# Patient Record
Sex: Female | Born: 1955 | Race: Black or African American | Hispanic: No | Marital: Married | Smoking: Former smoker
Health system: Southern US, Community
[De-identification: ages and names within clinical notes are randomized; demographics above are authoritative.]

## PROBLEM LIST (undated history)

## (undated) DIAGNOSIS — M4300 Spondylolysis, site unspecified: Secondary | ICD-10-CM

## (undated) DIAGNOSIS — D573 Sickle-cell trait: Secondary | ICD-10-CM

## (undated) DIAGNOSIS — R0602 Shortness of breath: Secondary | ICD-10-CM

## (undated) DIAGNOSIS — M549 Dorsalgia, unspecified: Secondary | ICD-10-CM

## (undated) DIAGNOSIS — M2550 Pain in unspecified joint: Secondary | ICD-10-CM

## (undated) DIAGNOSIS — E785 Hyperlipidemia, unspecified: Secondary | ICD-10-CM

## (undated) DIAGNOSIS — K219 Gastro-esophageal reflux disease without esophagitis: Secondary | ICD-10-CM

## (undated) DIAGNOSIS — Z720 Tobacco use: Secondary | ICD-10-CM

## (undated) DIAGNOSIS — G8929 Other chronic pain: Secondary | ICD-10-CM

## (undated) DIAGNOSIS — J449 Chronic obstructive pulmonary disease, unspecified: Secondary | ICD-10-CM

## (undated) DIAGNOSIS — I10 Essential (primary) hypertension: Secondary | ICD-10-CM

## (undated) HISTORY — PX: DILATION AND CURETTAGE OF UTERUS: SHX78

## (undated) HISTORY — PX: BREAST SURGERY: SHX581

## (undated) HISTORY — PX: BACK SURGERY: SHX140

## (undated) HISTORY — DX: Gastro-esophageal reflux disease without esophagitis: K21.9

## (undated) HISTORY — PX: COLONOSCOPY: SHX174

---

## 1999-05-29 ENCOUNTER — Other Ambulatory Visit: Admission: RE | Admit: 1999-05-29 | Discharge: 1999-05-29 | Payer: Self-pay | Admitting: Obstetrics

## 2000-05-11 ENCOUNTER — Encounter: Payer: Self-pay | Admitting: Obstetrics

## 2000-05-11 ENCOUNTER — Encounter: Admission: RE | Admit: 2000-05-11 | Discharge: 2000-05-11 | Payer: Self-pay | Admitting: Obstetrics

## 2001-05-12 ENCOUNTER — Encounter: Payer: Self-pay | Admitting: Obstetrics

## 2001-05-12 ENCOUNTER — Encounter: Admission: RE | Admit: 2001-05-12 | Discharge: 2001-05-12 | Payer: Self-pay | Admitting: Obstetrics

## 2002-05-16 ENCOUNTER — Encounter: Admission: RE | Admit: 2002-05-16 | Discharge: 2002-05-16 | Payer: Self-pay | Admitting: Obstetrics

## 2002-05-16 ENCOUNTER — Encounter: Payer: Self-pay | Admitting: Obstetrics

## 2003-03-20 ENCOUNTER — Encounter: Payer: Self-pay | Admitting: Family Medicine

## 2003-03-20 ENCOUNTER — Encounter: Admission: RE | Admit: 2003-03-20 | Discharge: 2003-03-20 | Payer: Self-pay | Admitting: Family Medicine

## 2003-05-18 ENCOUNTER — Encounter: Payer: Self-pay | Admitting: Obstetrics

## 2003-05-18 ENCOUNTER — Encounter: Admission: RE | Admit: 2003-05-18 | Discharge: 2003-05-18 | Payer: Self-pay | Admitting: Obstetrics

## 2004-03-07 ENCOUNTER — Ambulatory Visit (HOSPITAL_COMMUNITY): Admission: RE | Admit: 2004-03-07 | Discharge: 2004-03-07 | Payer: Self-pay | Admitting: Obstetrics

## 2004-03-07 ENCOUNTER — Encounter (INDEPENDENT_AMBULATORY_CARE_PROVIDER_SITE_OTHER): Payer: Self-pay | Admitting: Specialist

## 2004-05-08 ENCOUNTER — Encounter: Admission: RE | Admit: 2004-05-08 | Discharge: 2004-05-08 | Payer: Self-pay | Admitting: Family Medicine

## 2004-06-04 ENCOUNTER — Encounter: Admission: RE | Admit: 2004-06-04 | Discharge: 2004-06-04 | Payer: Self-pay | Admitting: Obstetrics

## 2005-06-05 ENCOUNTER — Encounter: Admission: RE | Admit: 2005-06-05 | Discharge: 2005-06-05 | Payer: Self-pay | Admitting: Obstetrics

## 2005-10-13 HISTORY — PX: BREAST EXCISIONAL BIOPSY: SUR124

## 2006-06-09 ENCOUNTER — Encounter: Admission: RE | Admit: 2006-06-09 | Discharge: 2006-06-09 | Payer: Self-pay | Admitting: Obstetrics

## 2007-06-15 ENCOUNTER — Encounter: Admission: RE | Admit: 2007-06-15 | Discharge: 2007-06-15 | Payer: Self-pay | Admitting: Obstetrics

## 2007-06-18 ENCOUNTER — Encounter: Admission: RE | Admit: 2007-06-18 | Discharge: 2007-06-18 | Payer: Self-pay | Admitting: Obstetrics

## 2007-07-02 ENCOUNTER — Ambulatory Visit (HOSPITAL_BASED_OUTPATIENT_CLINIC_OR_DEPARTMENT_OTHER): Admission: RE | Admit: 2007-07-02 | Discharge: 2007-07-02 | Payer: Self-pay | Admitting: General Surgery

## 2007-07-02 ENCOUNTER — Encounter: Admission: RE | Admit: 2007-07-02 | Discharge: 2007-07-02 | Payer: Self-pay | Admitting: General Surgery

## 2007-07-02 ENCOUNTER — Encounter (HOSPITAL_BASED_OUTPATIENT_CLINIC_OR_DEPARTMENT_OTHER): Payer: Self-pay | Admitting: General Surgery

## 2007-07-05 HISTORY — PX: BREAST EXCISIONAL BIOPSY: SUR124

## 2008-03-09 ENCOUNTER — Encounter: Admission: RE | Admit: 2008-03-09 | Discharge: 2008-03-09 | Payer: Self-pay | Admitting: Family Medicine

## 2008-06-15 ENCOUNTER — Encounter: Admission: RE | Admit: 2008-06-15 | Discharge: 2008-06-15 | Payer: Self-pay | Admitting: Obstetrics

## 2009-06-19 ENCOUNTER — Encounter: Admission: RE | Admit: 2009-06-19 | Discharge: 2009-06-19 | Payer: Self-pay | Admitting: Obstetrics

## 2010-06-20 ENCOUNTER — Encounter: Admission: RE | Admit: 2010-06-20 | Discharge: 2010-06-20 | Payer: Self-pay | Admitting: Obstetrics

## 2010-11-04 ENCOUNTER — Encounter: Payer: Self-pay | Admitting: Obstetrics

## 2011-01-30 ENCOUNTER — Other Ambulatory Visit (HOSPITAL_COMMUNITY)
Admission: RE | Admit: 2011-01-30 | Discharge: 2011-01-30 | Disposition: A | Discharge status: Home / Self Care | Payer: BC Managed Care – PPO | Source: Ambulatory Visit | Attending: Family Medicine | Admitting: Family Medicine

## 2011-01-30 ENCOUNTER — Other Ambulatory Visit: Payer: Self-pay | Admitting: Family Medicine

## 2011-01-30 DIAGNOSIS — Z124 Encounter for screening for malignant neoplasm of cervix: Secondary | ICD-10-CM | POA: Insufficient documentation

## 2011-01-30 DIAGNOSIS — R87810 Cervical high risk human papillomavirus (HPV) DNA test positive: Secondary | ICD-10-CM | POA: Insufficient documentation

## 2011-02-25 NOTE — Op Note (Signed)
NAMEDENISS, WORMLEY NO.:  [id number]   MEDICAL RECORD NO.:  [id number]          PATIENT TYPE:  AMB   LOCATION:  DSC                          FACILITY:  [facility]   PHYSICIAN:  Leonie Man, M.D.   DATE OF BIRTH:  1956/07/13   DATE OF PROCEDURE:  07/02/2007  DATE OF DISCHARGE:                               OPERATIVE REPORT   PREOPERATIVE DIAGNOSIS:  Abnormal left breast calcifications, rule out  carcinoma.   POSTOPERATIVE DIAGNOSIS:  Abnormal left breast calcifications, rule out  carcinoma.   PROCEDURE:  Needle localized excisional biopsy of left breast lesion.   SURGEON:  Leonie Man, M.D.   ASSISTANT:  OR nurse.   ANESTHESIA:  General.   NOTE:  Tammy Schwartz is a 55 year old woman presenting after mammogram  showing pleomorphic calcifications of the upper outer quadrant of her  left breast.  This was not able to be stereotactically biopsied due to  the fact that she had very small breasts and the lesion was so close to  the chest wall.  The patient comes to the operating room now after  needle localization for excisional biopsy of this lesion.  She  understands the risks and potential benefits of surgery and gives her  consent to same.   DESCRIPTION OF PROCEDURE:  The patient was positioned supinely.  Following the induction of satisfactory general endotracheal anesthesia,  the left breast was prepped and draped to be included in a sterile  operative field. Prior to beginning, the patient was identified as Tammy Schwartz and the site to be operated on as the left breast. A  localizing needle was already in place.  An elliptical incision was  carried down around the localizing needle deepening this through skin  down to the subcutaneous tissues. Flaps were raised superiorly and  inferiorly, laterally and medially, and a wedge of breast tissues taken  out carried all the way down to the chest wall following the localizing  needle.  The lesion is  removed and forwarded for specimen mammography.  Specimen mammography confirms that the calcifications are within the  breast.  Hemostasis was obtained with electrocautery.  Sponge and  instrument counts were verified.  The breast tissues were reapproximated  with 3-0 Vicryl sutures.  The skin was closed with a running 5-0  Monocryl suture and then reinforced with Steri-Strips.  Sterile  dressings were applied.  The anesthetic was reversed.  The patient was  removed from the operating room to the recovery room in stable  condition.  She tolerated the procedure well.     Leonie Man, M.D.  Electronically Signed    PB/MEDQ  D:  07/02/2007  T:  07/03/2007  Job:  [id number]   cc:   Kathreen Cosier, M.D.

## 2011-02-28 NOTE — Op Note (Signed)
NAME:  Tammy Schwartz, Tammy Schwartz                      ACCOUNT NO.:  [id number]   MEDICAL RECORD NO.:  [id number]                   PATIENT TYPE:  AMB   LOCATION:  SDC                                  FACILITY:  [facility]   PHYSICIAN:  Kathreen Cosier, M.D.           DATE OF BIRTH:  1956-07-07   DATE OF PROCEDURE:  03/07/2004  DATE OF DISCHARGE:                                 OPERATIVE REPORT   PREOPERATIVE DIAGNOSIS:  Dysfunctional uterine bleeding, dysmenorrhea, myoma  uteri.   POSTOPERATIVE DIAGNOSIS:  Dysfunctional uterine bleeding, dysmenorrhea,  myoma uteri.   PROCEDURE:  Hysteroscopy, dilation and curettage, Novasure endometrial  ablation.   Using general anesthesia with the patient in the lithotomy position, the  perineum and vagina were prepped and draped.  The bladder was emptied with  straight catheter.  Bimanual exam revealed the uterus to be enlarged with  small myomas.  The weighted speculum was placed in the vagina.  The anterior  lip of the cervix was grasped with the tenaculum.  The endocervix was  curetted and a small amount of tissue obtained.  The endometrial cavity was  sounded to 10 cm.  Using the Hegar dilator, cervical depth was measured to  about 5 cm making the cavity length 12 cm.  The cervix was dilated to a #27  Shawnie Pons.  The 5 mm hysteroscope was inserted.  The cavity appeared a bit  thickened, otherwise, normal.  The hysteroscopy pump was filled at 8 mmHg  ringers lactate  A sharp curettage was performed.  The Novasure was  inserted.  The cavity integrity was tested and noted to be intact.  Then,  the ablation power was at 60 watts for 90 seconds.  Hysteroscopy was  performed and the total cavity was noted to be ablated.  The patient  tolerated the procedure well and was taken to the recovery room in good  condition.                                               Kathreen Cosier, M.D.    BAM/MEDQ  D:  03/07/2004  T:  03/07/2004  Job:  [id number]

## 2011-05-23 ENCOUNTER — Other Ambulatory Visit: Payer: Self-pay | Admitting: Family Medicine

## 2011-05-23 DIAGNOSIS — Z1231 Encounter for screening mammogram for malignant neoplasm of breast: Secondary | ICD-10-CM

## 2011-07-01 ENCOUNTER — Ambulatory Visit: Payer: BC Managed Care – PPO

## 2011-07-14 ENCOUNTER — Ambulatory Visit
Admission: RE | Admit: 2011-07-14 | Discharge: 2011-07-14 | Disposition: A | Discharge status: Home / Self Care | Payer: BC Managed Care – PPO | Source: Ambulatory Visit | Attending: Family Medicine | Admitting: Family Medicine

## 2011-07-14 DIAGNOSIS — Z1231 Encounter for screening mammogram for malignant neoplasm of breast: Secondary | ICD-10-CM

## 2011-07-24 LAB — POCT HEMOGLOBIN-HEMACUE
Hemoglobin: 14.9
Operator id: 112821

## 2011-07-31 ENCOUNTER — Inpatient Hospital Stay (INDEPENDENT_AMBULATORY_CARE_PROVIDER_SITE_OTHER)
Admission: RE | Admit: 2011-07-31 | Discharge: 2011-07-31 | Disposition: A | Discharge status: Home / Self Care | Payer: BC Managed Care – PPO | Source: Ambulatory Visit | Attending: Family Medicine | Admitting: Family Medicine

## 2011-07-31 ENCOUNTER — Ambulatory Visit (INDEPENDENT_AMBULATORY_CARE_PROVIDER_SITE_OTHER): Payer: BC Managed Care – PPO

## 2011-07-31 DIAGNOSIS — S63509A Unspecified sprain of unspecified wrist, initial encounter: Secondary | ICD-10-CM

## 2011-07-31 DIAGNOSIS — M545 Low back pain, unspecified: Secondary | ICD-10-CM

## 2012-03-17 ENCOUNTER — Encounter (HOSPITAL_COMMUNITY): Payer: Self-pay | Admitting: *Deleted

## 2012-03-17 ENCOUNTER — Emergency Department (INDEPENDENT_AMBULATORY_CARE_PROVIDER_SITE_OTHER)
Admission: EM | Admit: 2012-03-17 | Discharge: 2012-03-17 | Disposition: A | Discharge status: Home / Self Care | Payer: BC Managed Care – PPO | Source: Home / Self Care | Attending: Family Medicine | Admitting: Family Medicine

## 2012-03-17 ENCOUNTER — Ambulatory Visit: Payer: BC Managed Care – PPO

## 2012-03-17 DIAGNOSIS — R52 Pain, unspecified: Secondary | ICD-10-CM

## 2012-03-17 DIAGNOSIS — J40 Bronchitis, not specified as acute or chronic: Secondary | ICD-10-CM

## 2012-03-17 DIAGNOSIS — M549 Dorsalgia, unspecified: Secondary | ICD-10-CM

## 2012-03-17 DIAGNOSIS — G8929 Other chronic pain: Secondary | ICD-10-CM

## 2012-03-17 MED ORDER — ALBUTEROL SULFATE HFA 108 (90 BASE) MCG/ACT IN AERS: 1.0000 | INHALATION_SPRAY | Freq: Four times a day (QID) | RESPIRATORY_TRACT | Status: DC | PRN

## 2012-03-17 MED ORDER — AZITHROMYCIN 250 MG PO TABS: 250.0000 mg | ORAL_TABLET | Freq: Every day | ORAL | Status: AC

## 2012-03-17 MED ORDER — PREDNISONE 20 MG PO TABS: ORAL_TABLET | ORAL | Status: AC

## 2012-03-17 MED ORDER — CELECOXIB 100 MG PO CAPS: 100.0000 mg | ORAL_CAPSULE | Freq: Two times a day (BID) | ORAL | Status: AC

## 2012-03-17 MED ORDER — CYCLOBENZAPRINE HCL 10 MG PO TABS: 10.0000 mg | ORAL_TABLET | Freq: Two times a day (BID) | ORAL | Status: AC | PRN

## 2012-03-17 MED ORDER — TRAMADOL HCL 50 MG PO TABS: 50.0000 mg | ORAL_TABLET | Freq: Three times a day (TID) | ORAL | Status: AC | PRN

## 2012-03-17 MED ORDER — BENZONATATE 100 MG PO CAPS: 100.0000 mg | ORAL_CAPSULE | Freq: Three times a day (TID) | ORAL | Status: AC

## 2012-03-17 NOTE — ED Notes (Signed)
Pt Reports

## 2012-03-17 NOTE — ED Notes (Signed)
Pt  Reports     Low  Back  Pain  With   Radiating  Down   Leg      X  sev  Weeks        She  denys  Any  Injury   She      Reports  Symptoms  Of  Cough as  Well       She  Is  Awake  As  Well as  Alert and  Oriented  She  Ambulates  To  Room  With a  Slow  Steady  Gait  She  Is  Sitting  Upright on  Exam table

## 2012-03-17 NOTE — ED Provider Notes (Signed)
History     CSN: [id number]  Arrival date & time 03/17/12  1112   First MD Initiated Contact with Patient 03/17/12 1115      Chief Complaint  Patient presents with  . Back Pain    (Consider location/radiation/quality/duration/timing/severity/associated sxs/prior treatment) HPI Comments: 56 year old smoker female with history of high blood pressure and chronic back pain. Here complaining of low back pain exacerbation for more than 2 weeks. Reports radiation to pain down to left eye and left knee. Taking ibuprofen inconsistently been using heat pad without improvement. Denies leg weakness or incontinence. No numbness or paresthesias in the lower extremities. Pain worse with movement. Also pain worse with cough the patient has been coughing intermittently for about one week reports cough is worst at nighttime and associated with wheezing also reports green sputum in the last 3 days. Denies fever. Denies chest pain or shortness of breath. No fever, dizziness or malaise. States she has had physical therapy for her back in the past.    History reviewed. No pertinent past medical history.  History reviewed. No pertinent past surgical history.  No family history on file.  History  Substance Use Topics  . Smoking status: Not on file  . Smokeless tobacco: Not on file  . Alcohol Use: Not on file    OB History    Grav Para Term Preterm Abortions TAB SAB Ect Mult Living                  Review of Systems  Constitutional: Negative for fever, chills and appetite change.  HENT: Positive for congestion. Negative for ear pain, sore throat, trouble swallowing, neck pain and sinus pressure.   Eyes: Negative for discharge.  Respiratory: Positive for cough and wheezing. Negative for chest tightness and shortness of breath.   Cardiovascular: Negative for chest pain, palpitations and leg swelling.  Gastrointestinal: Negative for nausea, vomiting, abdominal pain and constipation.  Genitourinary:  Negative for dysuria, frequency, hematuria and flank pain.  Musculoskeletal: Positive for back pain. Negative for joint swelling.  Skin: Negative for rash.  Neurological: Negative for dizziness, weakness, numbness and headaches.  All other systems reviewed and are negative.    Allergies  Review of patient's allergies indicates no known allergies.  Home Medications   Current Outpatient Rx  Name Route Sig Dispense Refill  . ALBUTEROL SULFATE HFA 108 (90 BASE) MCG/ACT IN AERS Inhalation Inhale 1-2 puffs into the lungs every 6 (six) hours as needed for wheezing or shortness of breath (or cough spells). 1 Inhaler 0  . BENZONATATE 100 MG PO CAPS Oral Take 1 capsule (100 mg total) by mouth every 8 (eight) hours. for cough 21 capsule 0  . CELECOXIB 100 MG PO CAPS Oral Take 1 capsule (100 mg total) by mouth 2 (two) times daily. 20 capsule 0  . CYCLOBENZAPRINE HCL 10 MG PO TABS Oral Take 1 tablet (10 mg total) by mouth 2 (two) times daily as needed for muscle spasms. 20 tablet 0  . PREDNISONE 20 MG PO TABS  2 tabs po daily for 5 days 10 tablet 0  . TRAMADOL HCL 50 MG PO TABS Oral Take 1 tablet (50 mg total) by mouth every 8 (eight) hours as needed for pain. 15 tablet 0    BP 137/86  Pulse 76  Temp(Src) 98.7 F (37.1 C) (Oral)  Resp 24  SpO2 100%  LMP 03/17/2012  Physical Exam  Nursing note and vitals reviewed. Constitutional: She is oriented to person, place, and time.  She appears well-developed and well-nourished. No distress.  HENT:  Head: Normocephalic and atraumatic.  Mouth/Throat: No oropharyngeal exudate.  Eyes: Conjunctivae and EOM are normal. Pupils are equal, round, and reactive to light.  Neck: Neck supple. No JVD present.  Pulmonary/Chest: No respiratory distress. She has no wheezes. She has no rales.       Bronchitic cough with bilateral sporadic expiratory rhonchi. No tachypnea or orthopnea. No active wheezing.  Abdominal: Soft. She exhibits no mass. There is no  tenderness.  Musculoskeletal:       Spine central. Limited flexion due to pain. Fair extension. Tenderness over lumbar paravertebral muscles bilaterally. Left straight leg test with reported pain in back and radiation to left thigh. Lower extremities with normal strength, as well as superficial 2 point discrimination and deep sensation. Symmetric Achilles and patellar DTRs.  Can walk on heels and tip toes.    Lymphadenopathy:    She has no cervical adenopathy.  Neurological: She is alert and oriented to person, place, and time.  Skin: No rash noted.    ED Course  Procedures (including critical care time)  Labs Reviewed - No data to display No results found.   1. Bronchitis   2. Exacerbation of chronic back pain       MDM  Smoker coughing with green sputum; symptoms for about one week associated with wheezing. Normal lung examination today but symptoms consistent with bronchitis. Prescribed prednisone, azithromycin, Tessalon Perles and albuterol. Chronic low back pain exacerbation with left sciatica. Prescribed Celebrex, Flexeril and tramadol asked to start stretching exercises as soon as pain improves and followup with primary care provider to monitor symptoms.        Sharin Grave, MD 03/20/12 [phone]

## 2012-03-17 NOTE — Discharge Instructions (Signed)
My impression is that you have bronchitis and frequent cough is triggering your chronic back pain. You need to quit smoking! Take the prescribed medications as instructed. Be aware that Flexeril and tramadol can make you drowsy and he should not drive after taking this medications. Start doing back stretching exercises as soon as pain improves follow provided handout. Return if worsening symptoms like chest pain, fever or difficulty breathing or followup with your primary care provider to monitor your symptoms if persistent despite following treatment.

## 2012-03-26 ENCOUNTER — Other Ambulatory Visit: Payer: Self-pay | Admitting: Family Medicine

## 2012-03-26 ENCOUNTER — Ambulatory Visit
Admission: RE | Admit: 2012-03-26 | Discharge: 2012-03-26 | Disposition: A | Discharge status: Home / Self Care | Payer: BC Managed Care – PPO | Source: Ambulatory Visit | Attending: Family Medicine | Admitting: Family Medicine

## 2012-03-26 DIAGNOSIS — M1250 Traumatic arthropathy, unspecified site: Secondary | ICD-10-CM

## 2012-04-12 ENCOUNTER — Other Ambulatory Visit: Payer: Self-pay | Admitting: Orthopedic Surgery

## 2012-04-12 DIAGNOSIS — M549 Dorsalgia, unspecified: Secondary | ICD-10-CM

## 2012-04-12 DIAGNOSIS — M5126 Other intervertebral disc displacement, lumbar region: Secondary | ICD-10-CM

## 2012-04-23 ENCOUNTER — Ambulatory Visit
Admission: RE | Admit: 2012-04-23 | Discharge: 2012-04-23 | Disposition: A | Discharge status: Home / Self Care | Payer: BC Managed Care – PPO | Source: Ambulatory Visit | Attending: Orthopedic Surgery | Admitting: Orthopedic Surgery

## 2012-04-23 DIAGNOSIS — M5126 Other intervertebral disc displacement, lumbar region: Secondary | ICD-10-CM

## 2012-04-23 DIAGNOSIS — M549 Dorsalgia, unspecified: Secondary | ICD-10-CM

## 2012-06-02 ENCOUNTER — Other Ambulatory Visit: Payer: Self-pay | Admitting: Neurosurgery

## 2012-06-03 ENCOUNTER — Encounter (HOSPITAL_COMMUNITY): Payer: Self-pay

## 2012-06-03 ENCOUNTER — Encounter (HOSPITAL_COMMUNITY): Payer: Self-pay | Admitting: Pharmacy Technician

## 2012-06-03 MED ORDER — CEFAZOLIN SODIUM-DEXTROSE 2-3 GM-% IV SOLR
2.0000 g | INTRAVENOUS | Status: AC
  Administered 2012-06-04: 2 g via INTRAVENOUS
  Filled 2012-06-03: qty 50

## 2012-06-04 ENCOUNTER — Encounter (HOSPITAL_COMMUNITY): Payer: Self-pay

## 2012-06-04 ENCOUNTER — Encounter (HOSPITAL_COMMUNITY)
Admission: RE | Disposition: A | Discharge status: Home / Self Care | Payer: Self-pay | Source: Ambulatory Visit | Attending: Neurosurgery

## 2012-06-04 ENCOUNTER — Ambulatory Visit (HOSPITAL_COMMUNITY): Payer: BC Managed Care – PPO

## 2012-06-04 ENCOUNTER — Ambulatory Visit (HOSPITAL_COMMUNITY)
Admission: RE | Admit: 2012-06-04 | Discharge: 2012-06-05 | Disposition: A | Discharge status: Home / Self Care | Payer: BC Managed Care – PPO | Source: Ambulatory Visit | Attending: Neurosurgery | Admitting: Neurosurgery

## 2012-06-04 DIAGNOSIS — I10 Essential (primary) hypertension: Secondary | ICD-10-CM | POA: Insufficient documentation

## 2012-06-04 DIAGNOSIS — M5126 Other intervertebral disc displacement, lumbar region: Secondary | ICD-10-CM | POA: Diagnosis present

## 2012-06-04 DIAGNOSIS — R0602 Shortness of breath: Secondary | ICD-10-CM | POA: Insufficient documentation

## 2012-06-04 HISTORY — PX: LUMBAR LAMINECTOMY/DECOMPRESSION MICRODISCECTOMY: SHX5026

## 2012-06-04 HISTORY — DX: Essential (primary) hypertension: I10

## 2012-06-04 HISTORY — DX: Shortness of breath: R06.02

## 2012-06-04 HISTORY — DX: Sickle-cell trait: D57.3

## 2012-06-04 LAB — CBC
HCT: 36.3 % (ref 36.0–46.0)
Hemoglobin: 13.2 g/dL (ref 12.0–15.0)
MCH: 33.1 pg (ref 26.0–34.0)
MCHC: 36.4 g/dL — ABNORMAL HIGH (ref 30.0–36.0)
MCV: 91 fL (ref 78.0–100.0)
Platelets: 278 K/uL (ref 150–400)
RBC: 3.99 MIL/uL (ref 3.87–5.11)
RDW: 13.6 % (ref 11.5–15.5)
WBC: 7.7 K/uL (ref 4.0–10.5)

## 2012-06-04 LAB — BASIC METABOLIC PANEL
BUN: 9 mg/dL (ref 6–23)
Calcium: 9.3 mg/dL (ref 8.4–10.5)
GFR calc Af Amer: >90 mL/min (ref >90)
GFR calc non Af Amer: >90 mL/min (ref >90)
Potassium: 3 mEq/L — ABNORMAL LOW (ref 3.5–5.1)
Sodium: 139 mEq/L (ref 135–145)

## 2012-06-04 LAB — SURGICAL PCR SCREEN
MRSA, PCR: NEGATIVE
Staphylococcus aureus: NEGATIVE

## 2012-06-04 SURGERY — LUMBAR LAMINECTOMY/DECOMPRESSION MICRODISCECTOMY 1 LEVEL
Anesthesia: General | Laterality: Left | Wound class: Clean

## 2012-06-04 MED ORDER — OXYCODONE HCL 5 MG/5ML PO SOLN: 5.0000 mg | Freq: Once | ORAL | Status: DC | PRN

## 2012-06-04 MED ORDER — MENTHOL 3 MG MT LOZG: 1.0000 | LOZENGE | OROMUCOSAL | Status: DC | PRN

## 2012-06-04 MED ORDER — ACETAMINOPHEN 10 MG/ML IV SOLN
INTRAVENOUS | Status: AC
  Administered 2012-06-04: 1000 mg
  Filled 2012-06-04: qty 100

## 2012-06-04 MED ORDER — DROPERIDOL 2.5 MG/ML IJ SOLN: 0.6250 mg | INTRAMUSCULAR | Status: DC | PRN

## 2012-06-04 MED ORDER — ADULT MULTIVITAMIN W/MINERALS CH
1.0000 | ORAL_TABLET | Freq: Every day | ORAL | Status: DC
  Administered 2012-06-04: 1 via ORAL
  Filled 2012-06-04 (×2): qty 1

## 2012-06-04 MED ORDER — MORPHINE SULFATE 2 MG/ML IJ SOLN: 1.0000 mg | INTRAMUSCULAR | Status: DC | PRN

## 2012-06-04 MED ORDER — ACETAMINOPHEN 10 MG/ML IV SOLN
1000.0000 mg | Freq: Four times a day (QID) | INTRAVENOUS | Status: DC
  Administered 2012-06-04 – 2012-06-05 (×2): 1000 mg via INTRAVENOUS
  Filled 2012-06-04 (×4): qty 100

## 2012-06-04 MED ORDER — HYDROCODONE-ACETAMINOPHEN 5-325 MG PO TABS: 1.0000 | ORAL_TABLET | ORAL | Status: DC | PRN

## 2012-06-04 MED ORDER — FENTANYL CITRATE 0.05 MG/ML IJ SOLN
INTRAMUSCULAR | Status: AC
  Filled 2012-06-04: qty 2

## 2012-06-04 MED ORDER — LIDOCAINE-EPINEPHRINE 0.5 %-1:200000 IJ SOLN
INTRAMUSCULAR | Status: DC | PRN
  Administered 2012-06-04: 50 mL

## 2012-06-04 MED ORDER — HYDROCHLOROTHIAZIDE 25 MG PO TABS
12.5000 mg | ORAL_TABLET | Freq: Every day | ORAL | Status: DC
  Administered 2012-06-04: 12.5 mg via ORAL
  Filled 2012-06-04 (×2): qty 0.5

## 2012-06-04 MED ORDER — ACETAMINOPHEN 650 MG RE SUPP: 650.0000 mg | RECTAL | Status: DC | PRN

## 2012-06-04 MED ORDER — SODIUM CHLORIDE 0.9 % IJ SOLN: 3.0000 mL | Freq: Two times a day (BID) | INTRAMUSCULAR | Status: DC

## 2012-06-04 MED ORDER — OXYCODONE HCL 5 MG PO TABS: 5.0000 mg | ORAL_TABLET | Freq: Once | ORAL | Status: DC | PRN

## 2012-06-04 MED ORDER — LACTATED RINGERS IV SOLN
INTRAVENOUS | Status: DC | PRN
  Administered 2012-06-04 (×2): via INTRAVENOUS

## 2012-06-04 MED ORDER — ACETAMINOPHEN 325 MG PO TABS: 650.0000 mg | ORAL_TABLET | ORAL | Status: DC | PRN

## 2012-06-04 MED ORDER — ALBUTEROL SULFATE HFA 108 (90 BASE) MCG/ACT IN AERS
1.0000 | INHALATION_SPRAY | Freq: Four times a day (QID) | RESPIRATORY_TRACT | Status: DC | PRN
  Filled 2012-06-04: qty 6.7

## 2012-06-04 MED ORDER — POTASSIUM CHLORIDE IN NACL 20-0.9 MEQ/L-% IV SOLN
INTRAVENOUS | Status: DC
  Administered 2012-06-05: 03:00:00 via INTRAVENOUS
  Filled 2012-06-04 (×3): qty 1000

## 2012-06-04 MED ORDER — ALUM & MAG HYDROXIDE-SIMETH 200-200-20 MG/5ML PO SUSP: 30.0000 mL | Freq: Four times a day (QID) | ORAL | Status: DC | PRN

## 2012-06-04 MED ORDER — MUPIROCIN 2 % EX OINT
TOPICAL_OINTMENT | CUTANEOUS | Status: AC
  Administered 2012-06-04: 1 via NASAL
  Filled 2012-06-04: qty 22

## 2012-06-04 MED ORDER — ONDANSETRON HCL 4 MG/2ML IJ SOLN
INTRAMUSCULAR | Status: DC | PRN
  Administered 2012-06-04: 4 mg via INTRAVENOUS

## 2012-06-04 MED ORDER — FENTANYL CITRATE 0.05 MG/ML IJ SOLN
INTRAMUSCULAR | Status: DC | PRN
  Administered 2012-06-04: 100 ug via INTRAVENOUS

## 2012-06-04 MED ORDER — HYDROMORPHONE HCL PF 1 MG/ML IJ SOLN
INTRAMUSCULAR | Status: AC
  Administered 2012-06-04: 0.5 mg via INTRAVENOUS
  Filled 2012-06-04: qty 1

## 2012-06-04 MED ORDER — CYCLOBENZAPRINE HCL 10 MG PO TABS
10.0000 mg | ORAL_TABLET | Freq: Three times a day (TID) | ORAL | Status: DC | PRN
Start: 2012-06-04 — End: 2012-06-05

## 2012-06-04 MED ORDER — MIDAZOLAM HCL 5 MG/5ML IJ SOLN
INTRAMUSCULAR | Status: DC | PRN
  Administered 2012-06-04: 1 mg via INTRAVENOUS

## 2012-06-04 MED ORDER — OXYCODONE-ACETAMINOPHEN 5-325 MG PO TABS
1.0000 | ORAL_TABLET | ORAL | Status: DC | PRN
  Administered 2012-06-04: 2 via ORAL
  Filled 2012-06-04: qty 2

## 2012-06-04 MED ORDER — GLYCOPYRROLATE 0.2 MG/ML IJ SOLN
INTRAMUSCULAR | Status: DC | PRN
  Administered 2012-06-04: .6 mg via INTRAVENOUS

## 2012-06-04 MED ORDER — METHYLPREDNISOLONE ACETATE 80 MG/ML IJ SUSP
INTRAMUSCULAR | Status: DC | PRN
  Administered 2012-06-04: 80 mg

## 2012-06-04 MED ORDER — ONDANSETRON HCL 4 MG/2ML IJ SOLN: 4.0000 mg | INTRAMUSCULAR | Status: DC | PRN

## 2012-06-04 MED ORDER — PHENYLEPHRINE HCL 10 MG/ML IJ SOLN
10.0000 mg | INTRAVENOUS | Status: DC | PRN
  Administered 2012-06-04: 20 ug/min via INTRAVENOUS

## 2012-06-04 MED ORDER — THROMBIN 5000 UNITS EX KIT
PACK | CUTANEOUS | Status: DC | PRN
  Administered 2012-06-04 (×2): 5000 [IU] via TOPICAL

## 2012-06-04 MED ORDER — 0.9 % SODIUM CHLORIDE (POUR BTL) OPTIME
TOPICAL | Status: DC | PRN
  Administered 2012-06-04: 1000 mL

## 2012-06-04 MED ORDER — AMLODIPINE-VALSARTAN-HCTZ 5-160-12.5 MG PO TABS: 1.0000 | ORAL_TABLET | Freq: Every day | ORAL | Status: DC

## 2012-06-04 MED ORDER — PROPOFOL 10 MG/ML IV EMUL
INTRAVENOUS | Status: DC | PRN
  Administered 2012-06-04: 160 mg via INTRAVENOUS
  Administered 2012-06-04: 40 mg via INTRAVENOUS

## 2012-06-04 MED ORDER — KETOROLAC TROMETHAMINE 30 MG/ML IJ SOLN
15.0000 mg | Freq: Four times a day (QID) | INTRAMUSCULAR | Status: DC
  Administered 2012-06-04 – 2012-06-05 (×2): 15 mg via INTRAVENOUS
  Filled 2012-06-04 (×6): qty 1

## 2012-06-04 MED ORDER — ROCURONIUM BROMIDE 100 MG/10ML IV SOLN
INTRAVENOUS | Status: DC | PRN
  Administered 2012-06-04: 10 mg via INTRAVENOUS
  Administered 2012-06-04: 40 mg via INTRAVENOUS

## 2012-06-04 MED ORDER — HYDROCODONE-ACETAMINOPHEN 5-325 MG PO TABS: 1.0000 | ORAL_TABLET | Freq: Four times a day (QID) | ORAL | Status: AC | PRN

## 2012-06-04 MED ORDER — CEFAZOLIN SODIUM 1-5 GM-% IV SOLN
1.0000 g | Freq: Three times a day (TID) | INTRAVENOUS | Status: AC
  Administered 2012-06-04 – 2012-06-05 (×2): 1 g via INTRAVENOUS
  Filled 2012-06-04 (×2): qty 50

## 2012-06-04 MED ORDER — CYCLOBENZAPRINE HCL 10 MG PO TABS: 10.0000 mg | ORAL_TABLET | Freq: Three times a day (TID) | ORAL | Status: AC | PRN

## 2012-06-04 MED ORDER — HYDROMORPHONE HCL PF 1 MG/ML IJ SOLN
0.2500 mg | INTRAMUSCULAR | Status: DC | PRN
  Administered 2012-06-04: 0.5 mg via INTRAVENOUS

## 2012-06-04 MED ORDER — SODIUM CHLORIDE 0.9 % IJ SOLN: 3.0000 mL | INTRAMUSCULAR | Status: DC | PRN

## 2012-06-04 MED ORDER — IRBESARTAN 150 MG PO TABS
150.0000 mg | ORAL_TABLET | Freq: Every day | ORAL | Status: DC
  Administered 2012-06-04: 150 mg via ORAL
  Filled 2012-06-04 (×2): qty 1

## 2012-06-04 MED ORDER — NEOSTIGMINE METHYLSULFATE 1 MG/ML IJ SOLN
INTRAMUSCULAR | Status: DC | PRN
  Administered 2012-06-04: 5 mg via INTRAVENOUS

## 2012-06-04 MED ORDER — HEMOSTATIC AGENTS (NO CHARGE) OPTIME
TOPICAL | Status: DC | PRN
  Administered 2012-06-04: 1 via TOPICAL

## 2012-06-04 MED ORDER — AMLODIPINE BESYLATE 5 MG PO TABS
5.0000 mg | ORAL_TABLET | Freq: Every day | ORAL | Status: DC
  Administered 2012-06-04: 5 mg via ORAL
  Filled 2012-06-04 (×2): qty 1

## 2012-06-04 MED ORDER — PHENOL 1.4 % MT LIQD: 1.0000 | OROMUCOSAL | Status: DC | PRN

## 2012-06-04 SURGICAL SUPPLY — 56 items
ADH SKN CLS APL DERMABOND .7 (GAUZE/BANDAGES/DRESSINGS) ×1
APL SKNCLS STERI-STRIP NONHPOA (GAUZE/BANDAGES/DRESSINGS)
BAG DECANTER FOR FLEXI CONT (MISCELLANEOUS) ×1 IMPLANT
BENZOIN TINCTURE PRP APPL 2/3 (GAUZE/BANDAGES/DRESSINGS) IMPLANT
BLADE SURG ROTATE 9660 (MISCELLANEOUS) IMPLANT
BUR MATCHSTICK NEURO 3.0 LAGG (BURR) ×2 IMPLANT
CANISTER SUCTION 2500CC (MISCELLANEOUS) ×2 IMPLANT
CLOTH BEACON ORANGE TIMEOUT ST (SAFETY) ×2 IMPLANT
CONT SPEC 4OZ CLIKSEAL STRL BL (MISCELLANEOUS) ×2 IMPLANT
DECANTER SPIKE VIAL GLASS SM (MISCELLANEOUS) ×1 IMPLANT
DERMABOND ADVANCED (GAUZE/BANDAGES/DRESSINGS) ×1
DERMABOND ADVANCED .7 DNX12 (GAUZE/BANDAGES/DRESSINGS) ×1 IMPLANT
DRAPE LAPAROTOMY 100X72X124 (DRAPES) ×2 IMPLANT
DRAPE MICROSCOPE LEICA (MISCELLANEOUS) ×2 IMPLANT
DRAPE POUCH INSTRU U-SHP 10X18 (DRAPES) ×2 IMPLANT
DRAPE SURG 17X23 STRL (DRAPES) ×2 IMPLANT
DURAPREP 26ML APPLICATOR (WOUND CARE) ×2 IMPLANT
ELECT REM PT RETURN 9FT ADLT (ELECTROSURGICAL) ×2
ELECTRODE REM PT RTRN 9FT ADLT (ELECTROSURGICAL) ×1 IMPLANT
GAUZE SPONGE 4X4 16PLY XRAY LF (GAUZE/BANDAGES/DRESSINGS) IMPLANT
GLOVE BIOGEL PI IND STRL 7.0 (GLOVE) IMPLANT
GLOVE BIOGEL PI INDICATOR 7.0 (GLOVE) ×1
GLOVE ECLIPSE 6.5 STRL STRAW (GLOVE) ×3 IMPLANT
GLOVE EXAM NITRILE LRG STRL (GLOVE) IMPLANT
GLOVE EXAM NITRILE MD LF STRL (GLOVE) IMPLANT
GLOVE EXAM NITRILE XL STR (GLOVE) IMPLANT
GLOVE EXAM NITRILE XS STR PU (GLOVE) IMPLANT
GLOVE SURG SS PI 6.5 STRL IVOR (GLOVE) ×1 IMPLANT
GOWN BRE IMP SLV AUR LG STRL (GOWN DISPOSABLE) ×4 IMPLANT
GOWN BRE IMP SLV AUR XL STRL (GOWN DISPOSABLE) IMPLANT
GOWN STRL REIN 2XL LVL4 (GOWN DISPOSABLE) IMPLANT
KIT BASIN OR (CUSTOM PROCEDURE TRAY) ×2 IMPLANT
KIT ROOM TURNOVER OR (KITS) ×2 IMPLANT
NDL HYPO 18GX1.5 BLUNT FILL (NEEDLE) IMPLANT
NDL HYPO 25X1 1.5 SAFETY (NEEDLE) ×1 IMPLANT
NDL SPNL 18GX3.5 QUINCKE PK (NEEDLE) IMPLANT
NEEDLE HYPO 18GX1.5 BLUNT FILL (NEEDLE) ×2 IMPLANT
NEEDLE HYPO 25X1 1.5 SAFETY (NEEDLE) ×2 IMPLANT
NEEDLE SPNL 18GX3.5 QUINCKE PK (NEEDLE) IMPLANT
NS IRRIG 1000ML POUR BTL (IV SOLUTION) ×2 IMPLANT
PACK LAMINECTOMY NEURO (CUSTOM PROCEDURE TRAY) ×2 IMPLANT
PAD ARMBOARD 7.5X6 YLW CONV (MISCELLANEOUS) ×6 IMPLANT
RUBBERBAND STERILE (MISCELLANEOUS) ×4 IMPLANT
SPONGE GAUZE 4X4 12PLY (GAUZE/BANDAGES/DRESSINGS) IMPLANT
SPONGE LAP 4X18 X RAY DECT (DISPOSABLE) IMPLANT
SPONGE SURGIFOAM ABS GEL SZ50 (HEMOSTASIS) ×2 IMPLANT
STRIP CLOSURE SKIN 1/2X4 (GAUZE/BANDAGES/DRESSINGS) IMPLANT
SUT VIC AB 0 CT1 18XCR BRD8 (SUTURE) ×1 IMPLANT
SUT VIC AB 0 CT1 8-18 (SUTURE) ×4
SUT VIC AB 2-0 CT1 18 (SUTURE) ×2 IMPLANT
SUT VIC AB 3-0 SH 8-18 (SUTURE) ×2 IMPLANT
SYR 20ML ECCENTRIC (SYRINGE) ×2 IMPLANT
SYR 5ML LL (SYRINGE) ×1 IMPLANT
TOWEL OR 17X24 6PK STRL BLUE (TOWEL DISPOSABLE) ×2 IMPLANT
TOWEL OR 17X26 10 PK STRL BLUE (TOWEL DISPOSABLE) ×2 IMPLANT
WATER STERILE IRR 1000ML POUR (IV SOLUTION) ×2 IMPLANT

## 2012-06-04 NOTE — Discharge Summary (Signed)
Discharge Summary Admitting DX: HNP L5/S1 left, Left S1 radiculopathy  Discharge DX: HNP L5/S1 left, Left S1 radiculopathy  Physician: Coletta Memos Surgery: Left L5/S1 discetomy with microdissection Complications: none Discharge Status: Alive, well, neurologically improved WUJ:[id number], Norco  Discharge Dest: Home Tammy Schwartz at discharge has a wound which is clean, dry and without signs of infection. She is walking well, voiding, and tolerating a regular diet. She has weakness in the gastrocnemius which was present on presentation. Motor exam is otherwise normal.  Admission date 06/04/2012 Discharge Date 06/05/2012

## 2012-06-04 NOTE — Anesthesia Postprocedure Evaluation (Signed)
Anesthesia Post Note  Patient: Tammy Schwartz  Procedure(s) Performed: Procedure(s) (LRB): LUMBAR LAMINECTOMY/DECOMPRESSION MICRODISCECTOMY 1 LEVEL (Left)  Anesthesia type: general  Patient location: PACU  Post pain: Pain level controlled  Post assessment: Patient's Cardiovascular Status Stable  Last Vitals:  Filed Vitals:   06/04/12 1600  BP: 102/69  Pulse: 68  Temp:   Resp: 11    Post vital signs: Reviewed and stable  Level of consciousness: sedated  Complications: No apparent anesthesia complications

## 2012-06-04 NOTE — Plan of Care (Signed)
Problem: Consults Goal: Diagnosis - Spinal Surgery Outcome: Completed/Met Date Met:  06/04/12 Microdiscectomy

## 2012-06-04 NOTE — Preoperative (Signed)
Beta Blockers   Reason not to administer Beta Blockers:Not Applicable 

## 2012-06-04 NOTE — Anesthesia Preprocedure Evaluation (Signed)
Anesthesia Evaluation  Patient identified by MRN, date of birth, ID band Patient awake    Reviewed: Allergy & Precautions, H&P , NPO status , Patient's Chart, lab work & pertinent test results  Airway Mallampati: I TM Distance: >3 FB Neck ROM: Full    Dental  (+) Dental Advisory Given, Poor Dentition, Loose and Missing   Pulmonary shortness of breath,  breath sounds clear to auscultation  Pulmonary exam normal       Cardiovascular hypertension, Pt. on medications Rhythm:Regular Rate:Normal     Neuro/Psych    GI/Hepatic negative GI ROS, Neg liver ROS,   Endo/Other  negative endocrine ROS  Renal/GU negative Renal ROS     Musculoskeletal   Abdominal   Peds  Hematology   Anesthesia Other Findings   Reproductive/Obstetrics                           Anesthesia Physical Anesthesia Plan  ASA: II  Anesthesia Plan: General   Post-op Pain Management:    Induction: Intravenous  Airway Management Planned: Oral ETT  Additional Equipment:   Intra-op Plan:   Post-operative Plan: Extubation in OR  Informed Consent: I have reviewed the patients History and Physical, chart, labs and discussed the procedure including the risks, benefits and alternatives for the proposed anesthesia with the patient or authorized representative who has indicated his/her understanding and acceptance.   Dental advisory given  Plan Discussed with: CRNA, Anesthesiologist and Surgeon  Anesthesia Plan Comments:         Anesthesia Quick Evaluation

## 2012-06-04 NOTE — H&P (Signed)
  BP 96/61  Temp 98.2 F (36.8 C) (Oral)  Resp 18  Ht 5\' 1"  (1.549 m)  Wt 53.071 kg (117 lb)  BMI 22.11 kg/m2  SpO2 98%  LMP 03/13/2006 HISTORY:     [name] presents today for evaluation of pain that she has in her back and left lower extremity.  She has had this pain for the last four months.  It has always been on the left side.  It travels into the lower extremity, into the foot and toes.  She has pain also in her back.  She has never had problems like this in the past. She is right-handed.  She has been treated by Dr. Myrtie Neither for evaluation of this pain for some time.  He obtained an MRI July 12th and it showed a fairly large disc herniation.  He felt that he had exhausted all conservative means and she agreed to a neurosurgical consultation.    PAST MEDICAL HISTORY:  Significant for hypertension.  She has no known drug allergies. Current medications are Exforge and a multivitamin one a day.    FAMILY HISTORY:    She did not provide family history.    SOCIAL HISTORY:    She does not use illicit drugs.  She does not smoke.  She does not drink alcohol.    REVIEW OF SYSTEMS:   Positive for weight loss, leg pain with walking, leg weakness, back pain, leg pain and joint pain.  She denies ears, nose, throat, mouth, respiratory, gastrointestinal, genitourinary, skin, neurological, psychiatric, endocrine, hematologic, and allergic problems.   PHYSICAL EXAMINATION:  On exam, she is alert, oriented x 4, and answering all questions appropriately.  She is in obvious distress. She has a markedly antalgic gait, limping even with the use of her cane.  She has used a cane for at least two weeks.  On motor evaluation, she has normal 5/5 strength in the upper extremities.  Normal 5/5 strength in the right lower extremity. She has fairly profound weakness in the left gastrocnemius.  She cannot toe walk on the left side. She has a dropped ankle jerk on the left. She has 2+ reflexes in the  biceps, triceps, brachioradialis, knees, and right ankle.  Intact proprioception.  The toes are downgoing to plantar stimulation.  Normal muscle tone and bulk.  She has had no bowel or bladder dysfunction. The pupils are equal, round, and reactive to light.  Full extraocular movements, full visual fields.  Symmetric facial sensation and movement.  Hearing intact to voice.  Uvula elevates in the midline.  Shoulder shrug is normal. Tongue protrudes in the midline. Lung fields are clear.  Pulses are good at the wrists bilaterally.  No cervical masses or bruits.     DIAGNOSTIC STUDIES:   MRI shows a large disc herniation on the left side at L5-S1 along with a  degenerated level.  The conus is normal.  The cauda equina is normal.  Paraspinous soft tissues are normal.  No tumors, infections, or other untoward things in the spinal canal.   SUMMARY:      We discussed at length the operation.  I am going to go ahead and take her to the operating room this coming Friday for a lumbar laminectomy and discectomy.  Risks and benefits of bleeding, infection, no relief have been explained.  She understands and is going to have this done this Friday.

## 2012-06-04 NOTE — Op Note (Addendum)
06/04/2012  3:25 PM  PATIENT:  Tammy Schwartz  56 y.o. female with a large disc herniation at L5/S1 on the left side compromising the Left S1 root. She on exam is weak in the gastrocnemius, and has pain consistent with an S1 radiculopathy. She has had no improvement with conservative treatment over the last 4 months. She has opted for surgical decompression  PRE-OPERATIVE DIAGNOSIS:  lumbar herniated disc Left L/S1  POST-OPERATIVE DIAGNOSIS:  lumbar herniated disc Left L5/S1 PROCEDURE:  Procedure(s): LUMBAR LAMINECTOMY/DECOMPRESSION MICRODISCECTOMY 1 LEVEL Left L5/S1 Microdissection  SURGEON:  Surgeon(s): Carmela Hurt, MD  ASSISTANTS:none  ANESTHESIA:   general  EBL:  Total I/O In: 1000 [I.V.:1000] Out: -   BLOOD ADMINISTERED:none  CELL SAVER GIVEN:none   COUNT:per nursing  DRAINS: none   SPECIMEN:  No Specimen  DICTATION: [name] was brought to the operating room and intubated without difficulty. She was positioned prone on the Wilson frame with all pressure points padded properly. Her lumbar region was prepped and draped in a sterile manner. I infiltrated 10cc 1/2%lidocaine with epinephrine into the midline at ~ the L5-S1 level. I opened the skin with a 10 blade and exposed the L5 and S1 lamina on the left. I confirmed my location with xray intraoperatively.I incised the ligamentum flavum between L5 and S1 to expose the epidural fat. I removed the ligament with Kerrison rongeurs and exposed the thecal sac. I with microscopic dissection retracted the thecal sac medially and the disc space. I used right angle blunt tip dissectors and removed large disc fragements caudal to the disc space. I was however not satisfied with the decompression, nor with the remaining disc impinging on the thecal sac. I therefore opened the disc space with a 15 blade and performed a discetomy. I used Epstein curettes, Kerrison punches, and pituitary rongeurs to remove disc within the disc  space. I decompressed the S1 nerve root and removed any loose disc material that I could. I inspected the nerve root, rostrally, caudally, medially, and laterally. I felt the root was well decompressed. I irrigated then infiltrated fentanyl and solumedrol over the nerve root and resection site. I closed the wound in layered fashion approximating the thoracolumbar fascia, subcutaneous, and subcuticular layers with vicryl sutures. I used dermAbond for a sterile dressing.   PLAN OF CARE: Admit for overnight observation  PATIENT DISPOSITION:  PACU - hemodynamically stable.   Delay start of Pharmacological VTE agent (>24hrs) due to surgical blood loss or risk of bleeding:  yes

## 2012-06-04 NOTE — Transfer of Care (Signed)
Immediate Anesthesia Transfer of Care Note  Patient: Tammy Schwartz  Procedure(s) Performed: Procedure(s) (LRB): LUMBAR LAMINECTOMY/DECOMPRESSION MICRODISCECTOMY 1 LEVEL (Left)  Patient Location: PACU  Anesthesia Type: General  Level of Consciousness: awake, alert , oriented and patient cooperative  Airway & Oxygen Therapy: Patient Spontanous Breathing and Patient connected to face mask oxygen  Post-op Assessment: Report given to PACU RN, Post -op Vital signs reviewed and stable and Patient moving all extremities  Post vital signs: Reviewed and stable  Complications: No apparent anesthesia complications

## 2012-06-05 NOTE — Progress Notes (Signed)
Pt doing very well. Pt and husband given D/C instructions with Rx's, both verbalized understanding. Pt D/C'd home via wheelchair with husband @ 0930 per MD order. Rema Fendt, RN

## 2012-06-08 ENCOUNTER — Encounter (HOSPITAL_COMMUNITY): Payer: Self-pay | Admitting: Neurosurgery

## 2012-06-10 ENCOUNTER — Encounter (HOSPITAL_COMMUNITY): Payer: Self-pay

## 2012-07-26 ENCOUNTER — Other Ambulatory Visit: Payer: Self-pay | Admitting: Family Medicine

## 2012-07-26 DIAGNOSIS — Z1231 Encounter for screening mammogram for malignant neoplasm of breast: Secondary | ICD-10-CM

## 2012-08-27 ENCOUNTER — Ambulatory Visit
Admission: RE | Admit: 2012-08-27 | Discharge: 2012-08-27 | Disposition: A | Discharge status: Home / Self Care | Payer: BC Managed Care – PPO | Source: Ambulatory Visit | Attending: Family Medicine | Admitting: Family Medicine

## 2012-08-27 DIAGNOSIS — Z1231 Encounter for screening mammogram for malignant neoplasm of breast: Secondary | ICD-10-CM

## 2012-09-02 ENCOUNTER — Other Ambulatory Visit: Payer: Self-pay | Admitting: Neurosurgery

## 2012-09-02 DIAGNOSIS — M5126 Other intervertebral disc displacement, lumbar region: Secondary | ICD-10-CM

## 2012-09-10 ENCOUNTER — Other Ambulatory Visit: Payer: BC Managed Care – PPO

## 2012-09-10 ENCOUNTER — Ambulatory Visit
Admission: RE | Admit: 2012-09-10 | Discharge: 2012-09-10 | Disposition: A | Discharge status: Home / Self Care | Payer: BC Managed Care – PPO | Source: Ambulatory Visit | Attending: Neurosurgery | Admitting: Neurosurgery

## 2012-09-10 DIAGNOSIS — M5126 Other intervertebral disc displacement, lumbar region: Secondary | ICD-10-CM

## 2012-09-10 MED ORDER — GADOBENATE DIMEGLUMINE 529 MG/ML IV SOLN
11.0000 mL | Freq: Once | INTRAVENOUS | Status: AC | PRN
  Administered 2012-09-10: 11 mL via INTRAVENOUS

## 2013-03-09 ENCOUNTER — Other Ambulatory Visit: Payer: Self-pay | Admitting: Family Medicine

## 2013-03-09 ENCOUNTER — Other Ambulatory Visit (HOSPITAL_COMMUNITY)
Admission: RE | Admit: 2013-03-09 | Discharge: 2013-03-09 | Disposition: A | Discharge status: Home / Self Care | Payer: BC Managed Care – PPO | Source: Ambulatory Visit | Attending: Family Medicine | Admitting: Family Medicine

## 2013-03-09 DIAGNOSIS — Z01419 Encounter for gynecological examination (general) (routine) without abnormal findings: Secondary | ICD-10-CM | POA: Insufficient documentation

## 2013-06-03 ENCOUNTER — Other Ambulatory Visit: Payer: Self-pay | Admitting: Neurosurgery

## 2013-06-03 DIAGNOSIS — M5416 Radiculopathy, lumbar region: Secondary | ICD-10-CM

## 2013-06-09 ENCOUNTER — Ambulatory Visit
Admission: RE | Admit: 2013-06-09 | Discharge: 2013-06-09 | Disposition: A | Discharge status: Home / Self Care | Payer: BC Managed Care – PPO | Source: Ambulatory Visit | Attending: Neurosurgery | Admitting: Neurosurgery

## 2013-06-09 DIAGNOSIS — M5416 Radiculopathy, lumbar region: Secondary | ICD-10-CM

## 2013-06-09 MED ORDER — GADOBENATE DIMEGLUMINE 529 MG/ML IV SOLN
10.0000 mL | Freq: Once | INTRAVENOUS | Status: AC | PRN
  Administered 2013-06-09: 10 mL via INTRAVENOUS

## 2013-06-14 ENCOUNTER — Other Ambulatory Visit: Payer: Self-pay | Admitting: Neurosurgery

## 2013-06-15 ENCOUNTER — Encounter (HOSPITAL_COMMUNITY): Payer: Self-pay | Admitting: *Deleted

## 2013-06-15 ENCOUNTER — Encounter (HOSPITAL_COMMUNITY): Payer: Self-pay

## 2013-06-15 MED ORDER — CEFAZOLIN SODIUM-DEXTROSE 2-3 GM-% IV SOLR
2.0000 g | INTRAVENOUS | Status: AC
  Administered 2013-06-16: 2 g via INTRAVENOUS
  Filled 2013-06-15: qty 50

## 2013-06-15 NOTE — Progress Notes (Signed)
Pt denies currently being short of breath ( although, has hx:of), chest pain, and being under the care of a cardiologist. Pt denies having a chest x ray and EKG in the last year. Pt denies having an echo, stress test, and cardiac cath. Pt stated that Darl Pikes from Dr. Sueanne Margarita office advised her to take Amlodipine-Valsartan-HCTZ (EXFORGE HCT) 5-160-12.5 MG TABS only on day of surgery. Pt advised that she can take  traMADol (ULTRAM) 50 MG tablet as needed for pain. Pt advised stop taking Aspirin Multivitamin and herbal medications. Do not take any NSAIDs ie: Ibuprofen, Advil, Naproxen or any medication containing Aspirin.

## 2013-06-16 ENCOUNTER — Ambulatory Visit (HOSPITAL_COMMUNITY): Payer: BC Managed Care – PPO | Admitting: Certified Registered Nurse Anesthetist

## 2013-06-16 ENCOUNTER — Encounter (HOSPITAL_COMMUNITY)
Admission: RE | Disposition: A | Discharge status: Home / Self Care | Payer: Self-pay | Source: Ambulatory Visit | Attending: Neurosurgery

## 2013-06-16 ENCOUNTER — Ambulatory Visit (HOSPITAL_COMMUNITY): Payer: BC Managed Care – PPO

## 2013-06-16 ENCOUNTER — Encounter (HOSPITAL_COMMUNITY): Payer: Self-pay | Admitting: Certified Registered Nurse Anesthetist

## 2013-06-16 ENCOUNTER — Observation Stay (HOSPITAL_COMMUNITY)
Admission: RE | Admit: 2013-06-16 | Discharge: 2013-06-17 | Disposition: A | Discharge status: Home / Self Care | Payer: BC Managed Care – PPO | Source: Ambulatory Visit | Attending: Neurosurgery | Admitting: Neurosurgery

## 2013-06-16 ENCOUNTER — Encounter (HOSPITAL_COMMUNITY): Payer: Self-pay | Admitting: *Deleted

## 2013-06-16 DIAGNOSIS — I10 Essential (primary) hypertension: Secondary | ICD-10-CM | POA: Insufficient documentation

## 2013-06-16 DIAGNOSIS — R269 Unspecified abnormalities of gait and mobility: Secondary | ICD-10-CM | POA: Insufficient documentation

## 2013-06-16 DIAGNOSIS — M5126 Other intervertebral disc displacement, lumbar region: Principal | ICD-10-CM | POA: Insufficient documentation

## 2013-06-16 DIAGNOSIS — M79609 Pain in unspecified limb: Secondary | ICD-10-CM | POA: Insufficient documentation

## 2013-06-16 HISTORY — PX: LUMBAR LAMINECTOMY/DECOMPRESSION MICRODISCECTOMY: SHX5026

## 2013-06-16 LAB — CBC
HCT: 36.1 % (ref 36.0–46.0)
MCHC: 36.8 g/dL — ABNORMAL HIGH (ref 30.0–36.0)
RDW: 14.2 % (ref 11.5–15.5)

## 2013-06-16 LAB — BASIC METABOLIC PANEL
BUN: 6 mg/dL (ref 6–23)
Calcium: 9.8 mg/dL (ref 8.4–10.5)
GFR calc Af Amer: >90 mL/min (ref >90)
GFR calc non Af Amer: >90 mL/min (ref >90)
Potassium: 3.4 mEq/L — ABNORMAL LOW (ref 3.5–5.1)

## 2013-06-16 SURGERY — LUMBAR LAMINECTOMY/DECOMPRESSION MICRODISCECTOMY 1 LEVEL
Anesthesia: General | Laterality: Right | Wound class: Clean

## 2013-06-16 MED ORDER — SODIUM CHLORIDE 0.9 % IJ SOLN: 3.0000 mL | INTRAMUSCULAR | Status: DC | PRN

## 2013-06-16 MED ORDER — MENTHOL 3 MG MT LOZG: 1.0000 | LOZENGE | OROMUCOSAL | Status: DC | PRN

## 2013-06-16 MED ORDER — ACETAMINOPHEN 650 MG RE SUPP: 650.0000 mg | RECTAL | Status: DC | PRN

## 2013-06-16 MED ORDER — DIAZEPAM 5 MG PO TABS
5.0000 mg | ORAL_TABLET | Freq: Four times a day (QID) | ORAL | Status: DC | PRN
  Administered 2013-06-16 – 2013-06-17 (×2): 5 mg via ORAL
  Filled 2013-06-16: qty 1

## 2013-06-16 MED ORDER — NEOSTIGMINE METHYLSULFATE 1 MG/ML IJ SOLN
INTRAMUSCULAR | Status: DC | PRN
  Administered 2013-06-16: 4 mg via INTRAVENOUS

## 2013-06-16 MED ORDER — LIDOCAINE-EPINEPHRINE 0.5 %-1:200000 IJ SOLN
INTRAMUSCULAR | Status: DC | PRN
  Administered 2013-06-16: 6 mL

## 2013-06-16 MED ORDER — OXYCODONE HCL 5 MG PO TABS
ORAL_TABLET | ORAL | Status: AC
  Filled 2013-06-16: qty 1

## 2013-06-16 MED ORDER — ONDANSETRON HCL 4 MG/2ML IJ SOLN: 4.0000 mg | INTRAMUSCULAR | Status: DC | PRN

## 2013-06-16 MED ORDER — CEFAZOLIN SODIUM-DEXTROSE 2-3 GM-% IV SOLR
INTRAVENOUS | Status: AC
  Filled 2013-06-16: qty 50

## 2013-06-16 MED ORDER — MONTELUKAST SODIUM 10 MG PO TABS
10.0000 mg | ORAL_TABLET | Freq: Every day | ORAL | Status: DC
  Administered 2013-06-16: 10 mg via ORAL
  Filled 2013-06-16 (×2): qty 1

## 2013-06-16 MED ORDER — FENTANYL CITRATE 0.05 MG/ML IJ SOLN
INTRAMUSCULAR | Status: DC | PRN
  Administered 2013-06-16: 150 ug via INTRAVENOUS
  Administered 2013-06-16 (×2): 25 ug via INTRAVENOUS

## 2013-06-16 MED ORDER — PHENYLEPHRINE HCL 10 MG/ML IJ SOLN
INTRAMUSCULAR | Status: DC | PRN
  Administered 2013-06-16: 80 ug via INTRAVENOUS
  Administered 2013-06-16: 40 ug via INTRAVENOUS
  Administered 2013-06-16 (×3): 80 ug via INTRAVENOUS

## 2013-06-16 MED ORDER — LACTATED RINGERS IV SOLN
INTRAVENOUS | Status: DC | PRN
  Administered 2013-06-16 (×2): via INTRAVENOUS

## 2013-06-16 MED ORDER — SENNA 8.6 MG PO TABS
1.0000 | ORAL_TABLET | Freq: Two times a day (BID) | ORAL | Status: DC
  Administered 2013-06-16: 8.6 mg via ORAL
  Filled 2013-06-16 (×3): qty 1

## 2013-06-16 MED ORDER — DEXAMETHASONE SODIUM PHOSPHATE 10 MG/ML IJ SOLN
INTRAMUSCULAR | Status: AC
  Filled 2013-06-16: qty 1

## 2013-06-16 MED ORDER — MIDAZOLAM HCL 5 MG/5ML IJ SOLN
INTRAMUSCULAR | Status: DC | PRN
  Administered 2013-06-16: 2 mg via INTRAVENOUS

## 2013-06-16 MED ORDER — AMLODIPINE-VALSARTAN-HCTZ 5-160-12.5 MG PO TABS: 1.0000 | ORAL_TABLET | Freq: Every day | ORAL | Status: DC

## 2013-06-16 MED ORDER — HYDROMORPHONE HCL PF 1 MG/ML IJ SOLN
0.2500 mg | INTRAMUSCULAR | Status: DC | PRN
  Administered 2013-06-16 (×2): 0.5 mg via INTRAVENOUS

## 2013-06-16 MED ORDER — OXYCODONE HCL 5 MG/5ML PO SOLN: 5.0000 mg | Freq: Once | ORAL | Status: AC | PRN

## 2013-06-16 MED ORDER — ROCURONIUM BROMIDE 100 MG/10ML IV SOLN
INTRAVENOUS | Status: DC | PRN
  Administered 2013-06-16: 5 mg via INTRAVENOUS
  Administered 2013-06-16: 50 mg via INTRAVENOUS

## 2013-06-16 MED ORDER — OXYCODONE-ACETAMINOPHEN 5-325 MG PO TABS
1.0000 | ORAL_TABLET | ORAL | Status: DC | PRN
  Administered 2013-06-16 – 2013-06-17 (×2): 2 via ORAL
  Filled 2013-06-16 (×2): qty 2

## 2013-06-16 MED ORDER — PHENOL 1.4 % MT LIQD: 1.0000 | OROMUCOSAL | Status: DC | PRN

## 2013-06-16 MED ORDER — HYDROCHLOROTHIAZIDE 12.5 MG PO CAPS
12.5000 mg | ORAL_CAPSULE | Freq: Every day | ORAL | Status: DC
  Filled 2013-06-16: qty 1

## 2013-06-16 MED ORDER — HYDROCODONE-ACETAMINOPHEN 5-325 MG PO TABS: 1.0000 | ORAL_TABLET | ORAL | Status: DC | PRN

## 2013-06-16 MED ORDER — ACETAMINOPHEN 325 MG PO TABS: 650.0000 mg | ORAL_TABLET | ORAL | Status: DC | PRN

## 2013-06-16 MED ORDER — DEXAMETHASONE SODIUM PHOSPHATE 10 MG/ML IJ SOLN
INTRAMUSCULAR | Status: DC | PRN
  Administered 2013-06-16: 10 mg via INTRAVENOUS

## 2013-06-16 MED ORDER — GLYCOPYRROLATE 0.2 MG/ML IJ SOLN
INTRAMUSCULAR | Status: DC | PRN
  Administered 2013-06-16: 0.6 mg via INTRAVENOUS

## 2013-06-16 MED ORDER — THROMBIN 5000 UNITS EX SOLR
CUTANEOUS | Status: DC | PRN
  Administered 2013-06-16 (×2): 5000 [IU] via TOPICAL

## 2013-06-16 MED ORDER — IRBESARTAN 150 MG PO TABS
150.0000 mg | ORAL_TABLET | Freq: Every day | ORAL | Status: DC
  Filled 2013-06-16: qty 1

## 2013-06-16 MED ORDER — MEPERIDINE HCL 25 MG/ML IJ SOLN: 6.2500 mg | INTRAMUSCULAR | Status: DC | PRN

## 2013-06-16 MED ORDER — DIAZEPAM 5 MG PO TABS
ORAL_TABLET | ORAL | Status: AC
  Filled 2013-06-16: qty 1

## 2013-06-16 MED ORDER — ONDANSETRON HCL 4 MG/2ML IJ SOLN
INTRAMUSCULAR | Status: DC | PRN
  Administered 2013-06-16: 4 mg via INTRAVENOUS

## 2013-06-16 MED ORDER — SODIUM CHLORIDE 0.9 % IJ SOLN
3.0000 mL | Freq: Two times a day (BID) | INTRAMUSCULAR | Status: DC
  Administered 2013-06-16: 3 mL via INTRAVENOUS

## 2013-06-16 MED ORDER — HEMOSTATIC AGENTS (NO CHARGE) OPTIME
TOPICAL | Status: DC | PRN
  Administered 2013-06-16: 1 via TOPICAL

## 2013-06-16 MED ORDER — LIDOCAINE HCL (CARDIAC) 20 MG/ML IV SOLN
INTRAVENOUS | Status: DC | PRN
  Administered 2013-06-16: 65 mg via INTRAVENOUS

## 2013-06-16 MED ORDER — AMLODIPINE BESYLATE 5 MG PO TABS
5.0000 mg | ORAL_TABLET | Freq: Every day | ORAL | Status: DC
  Filled 2013-06-16: qty 1

## 2013-06-16 MED ORDER — HYDROMORPHONE HCL PF 1 MG/ML IJ SOLN
INTRAMUSCULAR | Status: AC
  Filled 2013-06-16: qty 1

## 2013-06-16 MED ORDER — HYDROMORPHONE HCL PF 1 MG/ML IJ SOLN
0.5000 mg | INTRAMUSCULAR | Status: DC | PRN
  Administered 2013-06-16: 1 mg via INTRAVENOUS
  Filled 2013-06-16: qty 1

## 2013-06-16 MED ORDER — ARTIFICIAL TEARS OP OINT
TOPICAL_OINTMENT | OPHTHALMIC | Status: DC | PRN
  Administered 2013-06-16: 1 via OPHTHALMIC

## 2013-06-16 MED ORDER — LACTATED RINGERS IV SOLN
INTRAVENOUS | Status: DC
  Administered 2013-06-16: 11:00:00 via INTRAVENOUS

## 2013-06-16 MED ORDER — SODIUM CHLORIDE 0.9 % IV SOLN: 250.0000 mL | INTRAVENOUS | Status: DC

## 2013-06-16 MED ORDER — KETOROLAC TROMETHAMINE 30 MG/ML IJ SOLN
30.0000 mg | Freq: Four times a day (QID) | INTRAMUSCULAR | Status: DC
  Administered 2013-06-16 – 2013-06-17 (×3): 30 mg via INTRAVENOUS
  Filled 2013-06-16 (×6): qty 1

## 2013-06-16 MED ORDER — ADULT MULTIVITAMIN W/MINERALS CH
1.0000 | ORAL_TABLET | Freq: Every day | ORAL | Status: DC
  Administered 2013-06-16: 1 via ORAL
  Filled 2013-06-16 (×2): qty 1

## 2013-06-16 MED ORDER — OXYCODONE HCL 5 MG PO TABS
5.0000 mg | ORAL_TABLET | Freq: Once | ORAL | Status: AC | PRN
  Administered 2013-06-16: 5 mg via ORAL

## 2013-06-16 MED ORDER — MUPIROCIN 2 % EX OINT: TOPICAL_OINTMENT | Freq: Two times a day (BID) | CUTANEOUS | Status: DC

## 2013-06-16 MED ORDER — 0.9 % SODIUM CHLORIDE (POUR BTL) OPTIME
TOPICAL | Status: DC | PRN
  Administered 2013-06-16: 1000 mL

## 2013-06-16 MED ORDER — ONDANSETRON HCL 4 MG/2ML IJ SOLN: 4.0000 mg | Freq: Once | INTRAMUSCULAR | Status: DC | PRN

## 2013-06-16 MED ORDER — POTASSIUM CHLORIDE IN NACL 20-0.9 MEQ/L-% IV SOLN
INTRAVENOUS | Status: DC
  Filled 2013-06-16 (×3): qty 1000

## 2013-06-16 MED ORDER — PROPOFOL 10 MG/ML IV BOLUS
INTRAVENOUS | Status: DC | PRN
  Administered 2013-06-16: 120 mg via INTRAVENOUS

## 2013-06-16 SURGICAL SUPPLY — 57 items
ADH SKN CLS APL DERMABOND .7 (GAUZE/BANDAGES/DRESSINGS) ×1
APL SKNCLS STERI-STRIP NONHPOA (GAUZE/BANDAGES/DRESSINGS)
BAG DECANTER FOR FLEXI CONT (MISCELLANEOUS) ×2 IMPLANT
BENZOIN TINCTURE PRP APPL 2/3 (GAUZE/BANDAGES/DRESSINGS) IMPLANT
BLADE SURG ROTATE 9660 (MISCELLANEOUS) IMPLANT
BUR MATCHSTICK NEURO 3.0 LAGG (BURR) ×2 IMPLANT
CANISTER SUCTION 2500CC (MISCELLANEOUS) ×2 IMPLANT
CLOTH BEACON ORANGE TIMEOUT ST (SAFETY) ×2 IMPLANT
CONT SPEC 4OZ CLIKSEAL STRL BL (MISCELLANEOUS) ×2 IMPLANT
DECANTER SPIKE VIAL GLASS SM (MISCELLANEOUS) ×2 IMPLANT
DERMABOND ADVANCED (GAUZE/BANDAGES/DRESSINGS) ×1
DERMABOND ADVANCED .7 DNX12 (GAUZE/BANDAGES/DRESSINGS) ×1 IMPLANT
DRAPE LAPAROTOMY 100X72X124 (DRAPES) ×2 IMPLANT
DRAPE MICROSCOPE LEICA (MISCELLANEOUS) ×2 IMPLANT
DRAPE POUCH INSTRU U-SHP 10X18 (DRAPES) ×2 IMPLANT
DRAPE SURG 17X23 STRL (DRAPES) ×2 IMPLANT
DRSG OPSITE POSTOP 3X4 (GAUZE/BANDAGES/DRESSINGS) ×1 IMPLANT
DURAPREP 26ML APPLICATOR (WOUND CARE) ×2 IMPLANT
ELECT REM PT RETURN 9FT ADLT (ELECTROSURGICAL) ×2
ELECTRODE REM PT RTRN 9FT ADLT (ELECTROSURGICAL) ×1 IMPLANT
GAUZE SPONGE 4X4 16PLY XRAY LF (GAUZE/BANDAGES/DRESSINGS) IMPLANT
GLOVE BIO SURGEON STRL SZ8 (GLOVE) ×1 IMPLANT
GLOVE BIOGEL PI IND STRL 7.5 (GLOVE) IMPLANT
GLOVE BIOGEL PI IND STRL 8.5 (GLOVE) IMPLANT
GLOVE BIOGEL PI INDICATOR 7.5 (GLOVE) ×1
GLOVE BIOGEL PI INDICATOR 8.5 (GLOVE) ×1
GLOVE ECLIPSE 6.5 STRL STRAW (GLOVE) ×2 IMPLANT
GLOVE EXAM NITRILE LRG STRL (GLOVE) IMPLANT
GLOVE EXAM NITRILE MD LF STRL (GLOVE) IMPLANT
GLOVE EXAM NITRILE XL STR (GLOVE) IMPLANT
GLOVE EXAM NITRILE XS STR PU (GLOVE) IMPLANT
GLOVE SURG SS PI 7.0 STRL IVOR (GLOVE) ×2 IMPLANT
GOWN BRE IMP SLV AUR LG STRL (GOWN DISPOSABLE) ×4 IMPLANT
GOWN BRE IMP SLV AUR XL STRL (GOWN DISPOSABLE) ×1 IMPLANT
GOWN STRL REIN 2XL LVL4 (GOWN DISPOSABLE) IMPLANT
KIT BASIN OR (CUSTOM PROCEDURE TRAY) ×2 IMPLANT
KIT ROOM TURNOVER OR (KITS) ×2 IMPLANT
NDL HYPO 25X1 1.5 SAFETY (NEEDLE) ×1 IMPLANT
NDL SPNL 18GX3.5 QUINCKE PK (NEEDLE) IMPLANT
NEEDLE HYPO 25X1 1.5 SAFETY (NEEDLE) ×2 IMPLANT
NEEDLE SPNL 18GX3.5 QUINCKE PK (NEEDLE) IMPLANT
NS IRRIG 1000ML POUR BTL (IV SOLUTION) ×2 IMPLANT
PACK LAMINECTOMY NEURO (CUSTOM PROCEDURE TRAY) ×2 IMPLANT
PAD ARMBOARD 7.5X6 YLW CONV (MISCELLANEOUS) ×6 IMPLANT
RUBBERBAND STERILE (MISCELLANEOUS) ×4 IMPLANT
SPONGE GAUZE 4X4 12PLY (GAUZE/BANDAGES/DRESSINGS) IMPLANT
SPONGE LAP 4X18 X RAY DECT (DISPOSABLE) IMPLANT
SPONGE SURGIFOAM ABS GEL SZ50 (HEMOSTASIS) ×2 IMPLANT
STRIP CLOSURE SKIN 1/2X4 (GAUZE/BANDAGES/DRESSINGS) IMPLANT
SUT VIC AB 0 CT1 18XCR BRD8 (SUTURE) ×1 IMPLANT
SUT VIC AB 0 CT1 8-18 (SUTURE) ×2
SUT VIC AB 2-0 CT1 18 (SUTURE) ×2 IMPLANT
SUT VIC AB 3-0 SH 8-18 (SUTURE) ×2 IMPLANT
SYR 20ML ECCENTRIC (SYRINGE) ×2 IMPLANT
TOWEL OR 17X24 6PK STRL BLUE (TOWEL DISPOSABLE) ×2 IMPLANT
TOWEL OR 17X26 10 PK STRL BLUE (TOWEL DISPOSABLE) ×2 IMPLANT
WATER STERILE IRR 1000ML POUR (IV SOLUTION) ×2 IMPLANT

## 2013-06-16 NOTE — H&P (Signed)
BP 135/76  Pulse 92  Temp(Src) 97.6 F (36.4 C) (Oral)  Resp 20  Ht 5\' 1"  (1.549 m)  Wt 53.099 kg (117 lb 1 oz)  BMI 22.13 kg/m2  SpO2 96%  LMP 03/13/2006 [name] presents with pain in the right lower extremity which has been unremitting. There was no antecedent trauma, nor unusual activity. The pain is similar to what she had on the left but is now only on the right side.  PAST MEDICAL HISTORY:                              Significant for hypertension.  She has no known drug allergies. Current medications are Exforge and a multivitamin one a day.    FAMILY HISTORY:                                                             She did not provide family history.    SOCIAL HISTORY:                                                              She does not use illicit drugs.  She does not smoke.  She does not drink alcohol.    REVIEW OF SYSTEMS:                                     Positive for weight loss, leg pain with walking, leg weakness, back pain, leg pain and joint pain.  She denies ears, nose, throat, mouth, respiratory, gastrointestinal, genitourinary, skin, neurological, psychiatric, endocrine, hematologic, and allergic problems.   PHYSICAL EXAMINATION:                            On exam, she is alert, oriented x 4, and answering all questions appropriately.  She is in obvious distress. She has a markedly antalgic gait, limping even with the use of her cane.  She has used a cane for at least two weeks.  On motor evaluation, she has normal 5/5 strength in the upper extremities.  Normal 5/5 strength in the right lower extremity. She has fairly profound weakness in the left gastrocnemius.  She cannot toe walk on the left side. She has a dropped ankle jerk on the left. She has 2+ reflexes in the biceps, triceps, brachioradialis, knees, and right ankle.  Intact proprioception.  The toes are downgoing to plantar stimulation.  Normal muscle tone and bulk.  She has had no bowel or bladder  dysfunction. The pupils are equal, round, and reactive to light.  Full extraocular movements, full visual fields.  Symmetric facial sensation and movement.  Hearing intact to voice.  Uvula elevates in the midline.  Shoulder shrug is normal. Tongue protrudes in the midline. Lung fields are clear.  Pulses are good at the wrists bilaterally.  No cervical masses or bruits.    MRI of the lumbar spine shows that she  has a new right-sided disc herniation at L5-S1.  Where she underwent the left L5-S1 resection is clean.   IMPRESSION/PLAN:                             This is a reason for Tammy Schwartz's severe pain.  She has had no improvement.  She is using a cane and has a limp.  What I would do is schedule her for the operating room this coming Thursday for right L5-S1 lumbar laminectomy and discectomy.  Since she has had two previous lumbar laminectomies and discectomies, she is well versed in the risks and benefits and we went over that again.  She would like to proceed and we shall this coming Thursday.

## 2013-06-16 NOTE — Progress Notes (Signed)
REPORT GIVEN TO MARIA RN AS CAREGIVER 

## 2013-06-16 NOTE — Transfer of Care (Signed)
Immediate Anesthesia Transfer of Care Note  Patient: Tammy Schwartz  Procedure(s) Performed: Procedure(s) with comments: RIGHT Lumbar Five-Sacral One Microdiskectomy (Right) - RIGHT Lumbar Five-Sacral One Microdiskectomy  Patient Location: PACU  Anesthesia Type:General  Level of Consciousness: awake and patient cooperative  Airway & Oxygen Therapy: Patient Spontanous Breathing  Post-op Assessment: Report given to PACU RN, Post -op Vital signs reviewed and stable and Patient moving all extremities X 4  Post vital signs: Reviewed and stable  Complications: No apparent anesthesia complications

## 2013-06-16 NOTE — Anesthesia Postprocedure Evaluation (Signed)
Anesthesia Post Note  Patient: Tammy Schwartz  Procedure(s) Performed: Procedure(s) (LRB): RIGHT Lumbar Five-Sacral One Microdiskectomy (Right)  Anesthesia type: general  Patient location: PACU  Post pain: Pain level controlled  Post assessment: Patient's Cardiovascular Status Stable  Last Vitals:  Filed Vitals:   06/16/13 1430  BP: 101/54  Pulse:   Temp:   Resp: 18    Post vital signs: Reviewed and stable  Level of consciousness: sedated  Complications: No apparent anesthesia complications

## 2013-06-16 NOTE — Preoperative (Signed)
Beta Blockers   Reason not to administer Beta Blockers:Not Applicable 

## 2013-06-16 NOTE — Op Note (Signed)
06/16/2013  2:32 PM  PATIENT:  Tammy Schwartz  57 y.o. female with pain in the right lower exteremity due to a herniated disc at L5/S1  PRE-OPERATIVE DIAGNOSIS:  lumbar herniated disc L5/S1 right  POST-OPERATIVE DIAGNOSIS:  lumbar herniated disc L5/S1  PROCEDURE:  Procedure(s): RIGHT Lumbar Five-Sacral One Microdiskectomy  SURGEON:  Surgeon(s): Carmela Hurt, MD Maeola Harman, MD  ASSISTANTS:Stern, Jomarie Longs  ANESTHESIA:   general  EBL:  Total I/O In: 1400 [I.V.:1400] Out: 20 [Blood:20]  BLOOD ADMINISTERED:none  CELL SAVER GIVEN:none  COUNT:per nursing  DRAINS: none   SPECIMEN:  No Specimen  DICTATION: Mrs. Bruns was taken to the operating room, intubated and placed under a general anesthetic without difficulty. She was positioned prone on a Wilson frame with all pressure points padded. Her back was prepped and draped in a sterile manner. I opened the skin with a 10 blade and carried the dissection down to the thoracolumbar fascia. I used both sharp dissection and the monopolar cautery to expose the lamina of L5, and S1. I confirmed my location with an intraoperative xray.  I used the drill, Kerrison punches, and curettes to perform a semihemilaminectomy of L5. I used the punches to remove the ligamentum flavum to expose the thecal sac. I brought the microscope into the operative field and with Dr.Stern's assistance we started our decompression of the spinal canal, thecal sac and S1 root(s). I cauterized epidural veins overlying the disc space then divided them sharply. I opened the disc space with a 15 blade and proceeded with the discectomy. I used pituitary rongeurs, curettes, and other instruments to remove disc material. After the discectomy was completed we inspected the S1 nerve root and felt it was well decompressed. I explored rostrally, laterally, medially, and caudally and was satisfied with the decompression. I irrigated the wound, then closed in layers. I  approximated the thoracolumbar fascia, subcutaneous, and subcuticular planes with vicryl sutures. I used dermabond for a sterile dressing.   PLAN OF CARE: Admit for overnight observation  PATIENT DISPOSITION:  PACU - hemodynamically stable.   Delay start of Pharmacological VTE agent (>24hrs) due to surgical blood loss or risk of bleeding:  yes

## 2013-06-16 NOTE — Anesthesia Preprocedure Evaluation (Signed)
Anesthesia Evaluation  Patient identified by MRN, date of birth, ID band Patient awake    Reviewed: Allergy & Precautions, H&P , NPO status , Patient's Chart, lab work & pertinent test results  Airway Mallampati: I TM Distance: >3 FB Neck ROM: Full    Dental   Pulmonary shortness of breath,          Cardiovascular hypertension, Pt. on medications     Neuro/Psych    GI/Hepatic   Endo/Other    Renal/GU      Musculoskeletal   Abdominal   Peds  Hematology   Anesthesia Other Findings   Reproductive/Obstetrics                           Anesthesia Physical Anesthesia Plan  ASA: II  Anesthesia Plan: General   Post-op Pain Management:    Induction: Intravenous  Airway Management Planned: Oral ETT  Additional Equipment:   Intra-op Plan:   Post-operative Plan: Extubation in OR  Informed Consent: I have reviewed the patients History and Physical, chart, labs and discussed the procedure including the risks, benefits and alternatives for the proposed anesthesia with the patient or authorized representative who has indicated his/her understanding and acceptance.     Plan Discussed with: CRNA and Surgeon  Anesthesia Plan Comments:         Anesthesia Quick Evaluation

## 2013-06-17 ENCOUNTER — Encounter (HOSPITAL_COMMUNITY): Payer: Self-pay | Admitting: Neurosurgery

## 2013-06-17 MED ORDER — OXYCODONE-ACETAMINOPHEN 5-325 MG PO TABS: 1.0000 | ORAL_TABLET | Freq: Four times a day (QID) | ORAL | Status: DC | PRN

## 2013-06-17 MED ORDER — CYCLOBENZAPRINE HCL 5 MG PO TABS: 5.0000 mg | ORAL_TABLET | Freq: Three times a day (TID) | ORAL | Status: DC | PRN

## 2013-06-17 NOTE — Discharge Summary (Signed)
Physician Discharge Summary  Patient ID: Tammy Schwartz MRN: [id number] DOB/AGE: [birth year] 57 y.o.  Admit date: 06/16/2013 Discharge date: 06/17/2013  Admission Diagnoses:Displaced disc right L5/S1   Discharge Diagnoses: Displaced disc right L5/S1 Active Problems:   * No active hospital problems. *   Discharged Condition: good  Hospital Course: Tammy Schwartz was taken to the operating room where she underwent a right sided laminectomy and discetomy at L5/S1. Post op she states she still has some pain in the leg but it is improved. The wound is clean dry, and without signs of infection. She is moving all extremities well, and has been able to handle a regular diet.   Consults: None  Significant Diagnostic Studies: none  Treatments: surgery: as above  Discharge Exam: Blood pressure 103/68, pulse 79, temperature 98.9 F (37.2 C), temperature source Oral, resp. rate 16, height 5\' 1"  (1.549 m), weight 53.099 kg (117 lb 1 oz), last menstrual period 03/13/2006, SpO2 96.00%. General appearance: alert, cooperative, appears stated age and no distress Neurologic: Alert and oriented X 3, normal strength and tone. Normal symmetric reflexes. Normal coordination and gait  Disposition: 01-Home or Self Care     Medication List         cyclobenzaprine 5 MG tablet  Commonly known as:  FLEXERIL  Take 1 tablet (5 mg total) by mouth 3 (three) times daily as needed for muscle spasms.     EXFORGE HCT 5-160-12.5 MG Tabs  Generic drug:  Amlodipine-Valsartan-HCTZ  Take 1 tablet by mouth daily.     HYDROcodone-acetaminophen 5-325 MG per tablet  Commonly known as:  NORCO/VICODIN  Take 1 tablet by mouth every 6 (six) hours as needed for pain.     montelukast 10 MG tablet  Commonly known as:  SINGULAIR  Take 10 mg by mouth at bedtime.     multivitamin with minerals Tabs tablet  Take 1 tablet by mouth daily.     naproxen sodium 220 MG tablet  Commonly known as:  ANAPROX  Take 440 mg by  mouth 2 (two) times daily as needed (Pain).     oxyCODONE-acetaminophen 5-325 MG per tablet  Commonly known as:  ROXICET  Take 1 tablet by mouth every 6 (six) hours as needed for pain.     traMADol 50 MG tablet  Commonly known as:  ULTRAM  Take 50 mg by mouth every 6 (six) hours as needed for pain.           Follow-up Information   Follow up with Nahun Kronberg L, MD In 4 weeks. (call to make appointment)    Specialty:  Neurosurgery   Contact information:   [address]                         [address] [place] [address] [phone]       Signed: Bahja Bence L 06/17/2013, 10:04 AM

## 2013-06-17 NOTE — Progress Notes (Signed)
Pt and daughter given D/C instructions with Rx's, verbal understanding was given. All questions were answered prior to D/C. Pt D/C'd home via wheelchair @ 1210 per MD order. Rema Fendt, RN

## 2013-07-15 ENCOUNTER — Other Ambulatory Visit: Payer: Self-pay | Admitting: Neurosurgery

## 2013-07-15 DIAGNOSIS — M5126 Other intervertebral disc displacement, lumbar region: Secondary | ICD-10-CM

## 2013-07-25 ENCOUNTER — Ambulatory Visit
Admission: RE | Admit: 2013-07-25 | Discharge: 2013-07-25 | Disposition: A | Discharge status: Home / Self Care | Payer: BC Managed Care – PPO | Source: Ambulatory Visit | Attending: Neurosurgery | Admitting: Neurosurgery

## 2013-07-25 DIAGNOSIS — M5126 Other intervertebral disc displacement, lumbar region: Secondary | ICD-10-CM

## 2013-07-25 MED ORDER — GADOBENATE DIMEGLUMINE 529 MG/ML IV SOLN
11.0000 mL | Freq: Once | INTRAVENOUS | Status: AC | PRN
  Administered 2013-07-25: 11 mL via INTRAVENOUS

## 2013-08-25 ENCOUNTER — Other Ambulatory Visit: Payer: Self-pay

## 2013-08-25 DIAGNOSIS — Z1231 Encounter for screening mammogram for malignant neoplasm of breast: Secondary | ICD-10-CM

## 2013-09-28 ENCOUNTER — Ambulatory Visit
Admission: RE | Admit: 2013-09-28 | Discharge: 2013-09-28 | Disposition: A | Discharge status: Home / Self Care | Payer: BC Managed Care – PPO | Source: Ambulatory Visit

## 2013-09-28 DIAGNOSIS — Z1231 Encounter for screening mammogram for malignant neoplasm of breast: Secondary | ICD-10-CM

## 2014-01-18 ENCOUNTER — Other Ambulatory Visit: Payer: Self-pay | Admitting: Neurosurgery

## 2014-01-18 ENCOUNTER — Encounter (HOSPITAL_COMMUNITY): Payer: Self-pay | Admitting: Pharmacy Technician

## 2014-01-18 NOTE — Pre-Procedure Instructions (Signed)
Tammy MacadamVerna S Schwartz  01/18/2014   Your procedure is scheduled on:  Fri, April 10 @ 8:00 AM  [facility] 5:30 AM.  Call this number if you have problems the morning of surgery: [phone]   Remember:   Do not eat food or drink liquids after midnight.   Take these medicines the morning of surgery with A SIP OF WATER: Singulair(Montelukast) and Pain Pill(if needed)               No Goody's,BC's,Aleve,Aspirin,Ibuprofen,Fish Oil,or any Herbal Medications   Do not wear jewelry, make-up or nail polish.  Do not wear lotions, powders, or perfumes. You may wear deodorant.  Do not shave 48 hours prior to surgery.   Do not bring valuables to the hospital.  [facility] is not responsible                  for any belongings or valuables.               Contacts, dentures or bridgework may not be worn into surgery.  Leave suitcase in the car. After surgery it may be brought to your room.  For patients admitted to the hospital, discharge time is determined by your                treatment team.               Patients discharged the day of surgery will not be allowed to drive  home.    Special Instructions:  [facility] - Preparing for Surgery  Before surgery, you can play an important role.  Because skin is not sterile, your skin needs to be as free of germs as possible.  You can reduce the number of germs on you skin by washing with CHG (chlorahexidine gluconate) soap before surgery.  CHG is an antiseptic cleaner which kills germs and bonds with the skin to continue killing germs even after washing.  Please DO NOT use if you have an allergy to CHG or antibacterial soaps.  If your skin becomes reddened/irritated stop using the CHG and inform your nurse when you arrive at Short Stay.  Do not shave (including legs and underarms) for at least 48 hours prior to the first CHG shower.  You may shave your face.  Please follow these instructions carefully:   1.  Shower with CHG Soap  the night before surgery and the                                morning of Surgery.  2.  If you choose to wash your hair, wash your hair first as usual with your       normal shampoo.  3.  After you shampoo, rinse your hair and body thoroughly to remove the                      Shampoo.  4.  Use CHG as you would any other liquid soap.  You can apply chg directly       to the skin and wash gently with scrungie or a clean washcloth.  5.  Apply the CHG Soap to your body ONLY FROM THE NECK DOWN.        Do not use on open wounds or open sores.  Avoid contact with your eyes,       ears, mouth and  genitals (private parts).  Wash genitals (private parts)       with your normal soap.  6.  Wash thoroughly, paying special attention to the area where your surgery        will be performed.  7.  Thoroughly rinse your body with warm water from the neck down.  8.  DO NOT shower/wash with your normal soap after using and rinsing off       the CHG Soap.  9.  Pat yourself dry with a clean towel.            10.  Wear clean pajamas.            11.  Place clean sheets on your bed the night of your first shower and do not        sleep with pets.  Day of Surgery  Do not apply any lotions/deoderants the morning of surgery.  Please wear clean clothes to the hospital/surgery center.     Please read over the following fact sheets that you were given: Pain Booklet, Coughing and Deep Breathing, MRSA Information and Surgical Site Infection Prevention

## 2014-01-19 ENCOUNTER — Encounter (HOSPITAL_COMMUNITY): Payer: Self-pay

## 2014-01-19 ENCOUNTER — Encounter (HOSPITAL_COMMUNITY)
Admission: RE | Admit: 2014-01-19 | Discharge: 2014-01-19 | Disposition: A | Discharge status: Home / Self Care | Payer: BC Managed Care – PPO | Source: Ambulatory Visit | Attending: Neurosurgery | Admitting: Neurosurgery

## 2014-01-19 HISTORY — DX: Other chronic pain: G89.29

## 2014-01-19 HISTORY — DX: Hyperlipidemia, unspecified: E78.5

## 2014-01-19 HISTORY — DX: Other chronic pain: M54.9

## 2014-01-19 HISTORY — DX: Pain in unspecified joint: M25.50

## 2014-01-19 LAB — CBC
HCT: 37.1 % (ref 36.0–46.0)
HEMOGLOBIN: 13.4 g/dL (ref 12.0–15.0)
MCH: 30.7 pg (ref 26.0–34.0)
MCHC: 36.1 g/dL — ABNORMAL HIGH (ref 30.0–36.0)
MCV: 84.9 fL (ref 78.0–100.0)
Platelets: 311 10*3/uL (ref 150–400)
RBC: 4.37 MIL/uL (ref 3.87–5.11)
RDW: 15.1 % (ref 11.5–15.5)
WBC: 8.4 10*3/uL (ref 4.0–10.5)

## 2014-01-19 LAB — BASIC METABOLIC PANEL
BUN: 8 mg/dL (ref 6–23)
CO2: 21 mEq/L (ref 19–32)
Calcium: 10.2 mg/dL (ref 8.4–10.5)
Chloride: 104 mEq/L (ref 96–112)
Creatinine, Ser: 0.88 mg/dL (ref 0.50–1.10)
GFR calc Af Amer: 82 mL/min — ABNORMAL LOW (ref >90)
GFR calc non Af Amer: 71 mL/min — ABNORMAL LOW (ref >90)
Glucose, Bld: 94 mg/dL (ref 70–99)
Potassium: 3.1 mEq/L — ABNORMAL LOW (ref 3.7–5.3)
SODIUM: 143 meq/L (ref 137–147)

## 2014-01-19 LAB — SURGICAL PCR SCREEN
MRSA, PCR: NEGATIVE
STAPHYLOCOCCUS AUREUS: NEGATIVE

## 2014-01-19 MED ORDER — CEFAZOLIN SODIUM-DEXTROSE 2-3 GM-% IV SOLR
2.0000 g | INTRAVENOUS | Status: AC
  Administered 2014-01-20: 2 g via INTRAVENOUS
  Filled 2014-01-19: qty 50

## 2014-01-19 NOTE — Progress Notes (Addendum)
Pt doesn't have a cardiologist  Denies ever having an echo/stress test/heart cath   EKG and CXR in epic from 06-16-13  Medical Md is Dr.Veita Parke SimmersBland

## 2014-01-20 ENCOUNTER — Ambulatory Visit (HOSPITAL_COMMUNITY): Payer: BC Managed Care – PPO

## 2014-01-20 ENCOUNTER — Ambulatory Visit (HOSPITAL_COMMUNITY)
Admission: RE | Admit: 2014-01-20 | Discharge: 2014-01-20 | Disposition: A | Discharge status: Home / Self Care | Payer: BC Managed Care – PPO | Source: Ambulatory Visit | Attending: Neurosurgery | Admitting: Neurosurgery

## 2014-01-20 ENCOUNTER — Encounter (HOSPITAL_COMMUNITY)
Admission: RE | Disposition: A | Discharge status: Home / Self Care | Payer: Self-pay | Source: Ambulatory Visit | Attending: Neurosurgery

## 2014-01-20 ENCOUNTER — Encounter (HOSPITAL_COMMUNITY): Payer: BC Managed Care – PPO | Admitting: Anesthesiology

## 2014-01-20 ENCOUNTER — Ambulatory Visit (HOSPITAL_COMMUNITY): Payer: BC Managed Care – PPO | Admitting: Anesthesiology

## 2014-01-20 DIAGNOSIS — IMO0002 Reserved for concepts with insufficient information to code with codable children: Secondary | ICD-10-CM | POA: Insufficient documentation

## 2014-01-20 DIAGNOSIS — F17200 Nicotine dependence, unspecified, uncomplicated: Secondary | ICD-10-CM | POA: Insufficient documentation

## 2014-01-20 DIAGNOSIS — M47816 Spondylosis without myelopathy or radiculopathy, lumbar region: Secondary | ICD-10-CM | POA: Diagnosis present

## 2014-01-20 DIAGNOSIS — E785 Hyperlipidemia, unspecified: Secondary | ICD-10-CM | POA: Insufficient documentation

## 2014-01-20 DIAGNOSIS — D573 Sickle-cell trait: Secondary | ICD-10-CM | POA: Insufficient documentation

## 2014-01-20 DIAGNOSIS — G8929 Other chronic pain: Secondary | ICD-10-CM | POA: Insufficient documentation

## 2014-01-20 DIAGNOSIS — R0602 Shortness of breath: Secondary | ICD-10-CM | POA: Insufficient documentation

## 2014-01-20 DIAGNOSIS — M5126 Other intervertebral disc displacement, lumbar region: Secondary | ICD-10-CM | POA: Insufficient documentation

## 2014-01-20 DIAGNOSIS — Z01812 Encounter for preprocedural laboratory examination: Secondary | ICD-10-CM | POA: Insufficient documentation

## 2014-01-20 DIAGNOSIS — I10 Essential (primary) hypertension: Secondary | ICD-10-CM | POA: Insufficient documentation

## 2014-01-20 DIAGNOSIS — M47817 Spondylosis without myelopathy or radiculopathy, lumbosacral region: Secondary | ICD-10-CM | POA: Insufficient documentation

## 2014-01-20 HISTORY — PX: LUMBAR LAMINECTOMY/DECOMPRESSION MICRODISCECTOMY: SHX5026

## 2014-01-20 SURGERY — LUMBAR LAMINECTOMY/DECOMPRESSION MICRODISCECTOMY 1 LEVEL
Anesthesia: General | Laterality: Right

## 2014-01-20 MED ORDER — PHENYLEPHRINE HCL 10 MG/ML IJ SOLN
INTRAMUSCULAR | Status: DC | PRN
  Administered 2014-01-20 (×6): 40 ug via INTRAVENOUS

## 2014-01-20 MED ORDER — DIAZEPAM 5 MG PO TABS
ORAL_TABLET | ORAL | Status: AC
  Filled 2014-01-20: qty 1

## 2014-01-20 MED ORDER — KETOROLAC TROMETHAMINE 30 MG/ML IJ SOLN
30.0000 mg | Freq: Four times a day (QID) | INTRAMUSCULAR | Status: DC
  Administered 2014-01-20: 30 mg via INTRAVENOUS
  Filled 2014-01-20: qty 1

## 2014-01-20 MED ORDER — HYDROCODONE-ACETAMINOPHEN 5-325 MG PO TABS: 1.0000 | ORAL_TABLET | ORAL | Status: DC | PRN

## 2014-01-20 MED ORDER — HYDROMORPHONE HCL PF 1 MG/ML IJ SOLN
INTRAMUSCULAR | Status: AC
  Filled 2014-01-20: qty 1

## 2014-01-20 MED ORDER — AMLODIPINE BESYLATE 5 MG PO TABS
5.0000 mg | ORAL_TABLET | Freq: Every day | ORAL | Status: DC
  Filled 2014-01-20: qty 1

## 2014-01-20 MED ORDER — MIDAZOLAM HCL 2 MG/2ML IJ SOLN
INTRAMUSCULAR | Status: AC
  Filled 2014-01-20: qty 2

## 2014-01-20 MED ORDER — AMLODIPINE-VALSARTAN-HCTZ 5-160-12.5 MG PO TABS: 1.0000 | ORAL_TABLET | Freq: Every day | ORAL | Status: DC

## 2014-01-20 MED ORDER — NEOSTIGMINE METHYLSULFATE 1 MG/ML IJ SOLN
INTRAMUSCULAR | Status: DC | PRN
  Administered 2014-01-20: 1.25 mg via INTRAVENOUS

## 2014-01-20 MED ORDER — PROPOFOL 10 MG/ML IV BOLUS
INTRAVENOUS | Status: DC | PRN
  Administered 2014-01-20: 20 mg via INTRAVENOUS
  Administered 2014-01-20: 160 mg via INTRAVENOUS

## 2014-01-20 MED ORDER — IRBESARTAN 150 MG PO TABS
150.0000 mg | ORAL_TABLET | Freq: Every day | ORAL | Status: DC
  Filled 2014-01-20: qty 1

## 2014-01-20 MED ORDER — MIDAZOLAM HCL 5 MG/5ML IJ SOLN
INTRAMUSCULAR | Status: DC | PRN
  Administered 2014-01-20: 2 mg via INTRAVENOUS

## 2014-01-20 MED ORDER — FENTANYL CITRATE 0.05 MG/ML IJ SOLN
INTRAMUSCULAR | Status: DC | PRN
  Administered 2014-01-20: 25 ug via INTRAVENOUS
  Administered 2014-01-20: 75 ug via INTRAVENOUS
  Administered 2014-01-20: 150 ug via INTRAVENOUS

## 2014-01-20 MED ORDER — GLYCOPYRROLATE 0.2 MG/ML IJ SOLN
INTRAMUSCULAR | Status: AC
  Filled 2014-01-20: qty 1

## 2014-01-20 MED ORDER — SODIUM CHLORIDE 0.9 % IJ SOLN: 3.0000 mL | Freq: Two times a day (BID) | INTRAMUSCULAR | Status: DC

## 2014-01-20 MED ORDER — LIDOCAINE HCL (CARDIAC) 20 MG/ML IV SOLN
INTRAVENOUS | Status: DC | PRN
  Administered 2014-01-20: 60 mg via INTRAVENOUS

## 2014-01-20 MED ORDER — PHENOL 1.4 % MT LIQD: 1.0000 | OROMUCOSAL | Status: DC | PRN

## 2014-01-20 MED ORDER — ADULT MULTIVITAMIN W/MINERALS CH
1.0000 | ORAL_TABLET | Freq: Every day | ORAL | Status: DC
  Filled 2014-01-20: qty 1

## 2014-01-20 MED ORDER — ALUM & MAG HYDROXIDE-SIMETH 200-200-20 MG/5ML PO SUSP: 30.0000 mL | Freq: Four times a day (QID) | ORAL | Status: DC | PRN

## 2014-01-20 MED ORDER — CYCLOBENZAPRINE HCL 10 MG PO TABS: 10.0000 mg | ORAL_TABLET | Freq: Three times a day (TID) | ORAL | Status: DC | PRN

## 2014-01-20 MED ORDER — LACTATED RINGERS IV SOLN
INTRAVENOUS | Status: DC | PRN
  Administered 2014-01-20 (×2): via INTRAVENOUS

## 2014-01-20 MED ORDER — ALBUMIN HUMAN 5 % IV SOLN
INTRAVENOUS | Status: DC | PRN
  Administered 2014-01-20: 09:00:00 via INTRAVENOUS

## 2014-01-20 MED ORDER — SENNA 8.6 MG PO TABS: 1.0000 | ORAL_TABLET | Freq: Two times a day (BID) | ORAL | Status: DC

## 2014-01-20 MED ORDER — HYDROCHLOROTHIAZIDE 12.5 MG PO CAPS
12.5000 mg | ORAL_CAPSULE | Freq: Every day | ORAL | Status: DC
  Filled 2014-01-20: qty 1

## 2014-01-20 MED ORDER — THROMBIN 5000 UNITS EX SOLR
CUTANEOUS | Status: DC | PRN
  Administered 2014-01-20 (×2): 5000 [IU] via TOPICAL

## 2014-01-20 MED ORDER — ROCURONIUM BROMIDE 50 MG/5ML IV SOLN
INTRAVENOUS | Status: AC
  Filled 2014-01-20: qty 1

## 2014-01-20 MED ORDER — POLYETHYLENE GLYCOL 3350 17 G PO PACK
17.0000 g | PACK | Freq: Every day | ORAL | Status: DC | PRN
  Filled 2014-01-20: qty 1

## 2014-01-20 MED ORDER — PHENYLEPHRINE HCL 10 MG/ML IJ SOLN
INTRAMUSCULAR | Status: AC
  Filled 2014-01-20: qty 1

## 2014-01-20 MED ORDER — ACETAMINOPHEN 325 MG PO TABS: 650.0000 mg | ORAL_TABLET | ORAL | Status: DC | PRN

## 2014-01-20 MED ORDER — OXYCODONE-ACETAMINOPHEN 5-325 MG PO TABS: 1.0000 | ORAL_TABLET | Freq: Four times a day (QID) | ORAL | Status: DC | PRN

## 2014-01-20 MED ORDER — HEMOSTATIC AGENTS (NO CHARGE) OPTIME
TOPICAL | Status: DC | PRN
  Administered 2014-01-20: 1 via TOPICAL

## 2014-01-20 MED ORDER — LIDOCAINE HCL (CARDIAC) 20 MG/ML IV SOLN
INTRAVENOUS | Status: AC
  Filled 2014-01-20: qty 5

## 2014-01-20 MED ORDER — 0.9 % SODIUM CHLORIDE (POUR BTL) OPTIME
TOPICAL | Status: DC | PRN
  Administered 2014-01-20: 1000 mL

## 2014-01-20 MED ORDER — LACTATED RINGERS IV SOLN
INTRAVENOUS | Status: DC
  Administered 2014-01-20: 07:00:00 via INTRAVENOUS

## 2014-01-20 MED ORDER — SODIUM CHLORIDE 0.9 % IJ SOLN: 3.0000 mL | INTRAMUSCULAR | Status: DC | PRN

## 2014-01-20 MED ORDER — ROCURONIUM BROMIDE 100 MG/10ML IV SOLN
INTRAVENOUS | Status: DC | PRN
  Administered 2014-01-20: 50 mg via INTRAVENOUS

## 2014-01-20 MED ORDER — ONDANSETRON HCL 4 MG/2ML IJ SOLN: 4.0000 mg | INTRAMUSCULAR | Status: DC | PRN

## 2014-01-20 MED ORDER — PROPOFOL 10 MG/ML IV BOLUS
INTRAVENOUS | Status: AC
  Filled 2014-01-20: qty 20

## 2014-01-20 MED ORDER — NEOSTIGMINE METHYLSULFATE 1 MG/ML IJ SOLN
INTRAMUSCULAR | Status: AC
  Filled 2014-01-20: qty 10

## 2014-01-20 MED ORDER — NOREPINEPHRINE BITARTRATE 1 MG/ML IJ SOLN
4000.0000 ug | INTRAVENOUS | Status: DC | PRN
  Administered 2014-01-20: 40 ug/min via INTRAVENOUS

## 2014-01-20 MED ORDER — MONTELUKAST SODIUM 10 MG PO TABS
10.0000 mg | ORAL_TABLET | Freq: Every day | ORAL | Status: DC
  Filled 2014-01-20: qty 1

## 2014-01-20 MED ORDER — OXYCODONE-ACETAMINOPHEN 5-325 MG PO TABS: 1.0000 | ORAL_TABLET | ORAL | Status: DC | PRN

## 2014-01-20 MED ORDER — DIAZEPAM 5 MG PO TABS
5.0000 mg | ORAL_TABLET | Freq: Four times a day (QID) | ORAL | Status: DC | PRN
  Administered 2014-01-20: 5 mg via ORAL

## 2014-01-20 MED ORDER — MENTHOL 3 MG MT LOZG: 1.0000 | LOZENGE | OROMUCOSAL | Status: DC | PRN

## 2014-01-20 MED ORDER — LIDOCAINE-EPINEPHRINE 0.5 %-1:200000 IJ SOLN
INTRAMUSCULAR | Status: DC | PRN
  Administered 2014-01-20: 10 mL

## 2014-01-20 MED ORDER — ACETAMINOPHEN 650 MG RE SUPP: 650.0000 mg | RECTAL | Status: DC | PRN

## 2014-01-20 MED ORDER — PHENYLEPHRINE 40 MCG/ML (10ML) SYRINGE FOR IV PUSH (FOR BLOOD PRESSURE SUPPORT)
PREFILLED_SYRINGE | INTRAVENOUS | Status: AC
  Filled 2014-01-20: qty 10

## 2014-01-20 MED ORDER — GLYCOPYRROLATE 0.2 MG/ML IJ SOLN
INTRAMUSCULAR | Status: DC | PRN
  Administered 2014-01-20: 0.2 mg via INTRAVENOUS

## 2014-01-20 MED ORDER — FENTANYL CITRATE 0.05 MG/ML IJ SOLN
INTRAMUSCULAR | Status: AC
  Filled 2014-01-20: qty 5

## 2014-01-20 MED ORDER — HYDROMORPHONE HCL PF 1 MG/ML IJ SOLN
0.2500 mg | INTRAMUSCULAR | Status: DC | PRN
  Administered 2014-01-20 (×2): 0.5 mg via INTRAVENOUS

## 2014-01-20 MED ORDER — POTASSIUM CHLORIDE IN NACL 20-0.9 MEQ/L-% IV SOLN
INTRAVENOUS | Status: DC
  Filled 2014-01-20 (×2): qty 1000

## 2014-01-20 MED ORDER — HYDROMORPHONE HCL PF 1 MG/ML IJ SOLN
0.5000 mg | INTRAMUSCULAR | Status: DC | PRN
  Administered 2014-01-20: 1 mg via INTRAVENOUS
  Filled 2014-01-20: qty 1

## 2014-01-20 SURGICAL SUPPLY — 61 items
ADH SKN CLS APL DERMABOND .7 (GAUZE/BANDAGES/DRESSINGS) ×1
ADH SKN CLS LQ APL DERMABOND (GAUZE/BANDAGES/DRESSINGS) ×1
APL SKNCLS STERI-STRIP NONHPOA (GAUZE/BANDAGES/DRESSINGS)
BAG DECANTER FOR FLEXI CONT (MISCELLANEOUS) ×2 IMPLANT
BENZOIN TINCTURE PRP APPL 2/3 (GAUZE/BANDAGES/DRESSINGS) IMPLANT
BLADE SURG ROTATE 9660 (MISCELLANEOUS) IMPLANT
BUR MATCHSTICK NEURO 3.0 LAGG (BURR) ×2 IMPLANT
CANISTER SUCT 3000ML (MISCELLANEOUS) ×2 IMPLANT
CONT SPEC 4OZ CLIKSEAL STRL BL (MISCELLANEOUS) ×2 IMPLANT
DECANTER SPIKE VIAL GLASS SM (MISCELLANEOUS) ×2 IMPLANT
DERMABOND ADHESIVE PROPEN (GAUZE/BANDAGES/DRESSINGS) ×1
DERMABOND ADVANCED (GAUZE/BANDAGES/DRESSINGS) ×1
DERMABOND ADVANCED .7 DNX12 (GAUZE/BANDAGES/DRESSINGS) ×1 IMPLANT
DERMABOND ADVANCED .7 DNX6 (GAUZE/BANDAGES/DRESSINGS) IMPLANT
DRAPE LAPAROTOMY 100X72X124 (DRAPES) ×2 IMPLANT
DRAPE MICROSCOPE LEICA (MISCELLANEOUS) ×2 IMPLANT
DRAPE POUCH INSTRU U-SHP 10X18 (DRAPES) ×2 IMPLANT
DRAPE SURG 17X23 STRL (DRAPES) ×2 IMPLANT
DURAPREP 26ML APPLICATOR (WOUND CARE) ×2 IMPLANT
ELECT REM PT RETURN 9FT ADLT (ELECTROSURGICAL) ×2
ELECTRODE REM PT RTRN 9FT ADLT (ELECTROSURGICAL) ×1 IMPLANT
GAUZE SPONGE 4X4 16PLY XRAY LF (GAUZE/BANDAGES/DRESSINGS) IMPLANT
GLOVE BIOGEL PI IND STRL 7.5 (GLOVE) IMPLANT
GLOVE BIOGEL PI INDICATOR 7.5 (GLOVE) ×2
GLOVE ECLIPSE 6.5 STRL STRAW (GLOVE) ×2 IMPLANT
GLOVE ECLIPSE 8.5 STRL (GLOVE) ×1 IMPLANT
GLOVE EXAM NITRILE LRG STRL (GLOVE) IMPLANT
GLOVE EXAM NITRILE MD LF STRL (GLOVE) IMPLANT
GLOVE EXAM NITRILE XL STR (GLOVE) IMPLANT
GLOVE EXAM NITRILE XS STR PU (GLOVE) IMPLANT
GLOVE SS BIOGEL STRL SZ 7.5 (GLOVE) IMPLANT
GLOVE SUPERSENSE BIOGEL SZ 7.5 (GLOVE) ×3
GOWN BRE IMP SLV AUR LG STRL (GOWN DISPOSABLE) ×2 IMPLANT
GOWN BRE IMP SLV AUR XL STRL (GOWN DISPOSABLE) IMPLANT
GOWN STRL REIN 2XL LVL4 (GOWN DISPOSABLE) IMPLANT
GOWN STRL REUS W/ TWL LRG LVL3 (GOWN DISPOSABLE) IMPLANT
GOWN STRL REUS W/ TWL XL LVL3 (GOWN DISPOSABLE) IMPLANT
GOWN STRL REUS W/TWL LRG LVL3 (GOWN DISPOSABLE) ×6
GOWN STRL REUS W/TWL XL LVL3 (GOWN DISPOSABLE) ×2
KIT BASIN OR (CUSTOM PROCEDURE TRAY) ×2 IMPLANT
KIT ROOM TURNOVER OR (KITS) ×2 IMPLANT
NDL HYPO 25X1 1.5 SAFETY (NEEDLE) ×1 IMPLANT
NDL SPNL 18GX3.5 QUINCKE PK (NEEDLE) IMPLANT
NEEDLE HYPO 25X1 1.5 SAFETY (NEEDLE) ×2 IMPLANT
NEEDLE SPNL 18GX3.5 QUINCKE PK (NEEDLE) IMPLANT
NS IRRIG 1000ML POUR BTL (IV SOLUTION) ×2 IMPLANT
PACK LAMINECTOMY NEURO (CUSTOM PROCEDURE TRAY) ×2 IMPLANT
PAD ARMBOARD 7.5X6 YLW CONV (MISCELLANEOUS) ×6 IMPLANT
RUBBERBAND STERILE (MISCELLANEOUS) ×4 IMPLANT
SPONGE GAUZE 4X4 12PLY (GAUZE/BANDAGES/DRESSINGS) IMPLANT
SPONGE LAP 4X18 X RAY DECT (DISPOSABLE) IMPLANT
SPONGE SURGIFOAM ABS GEL SZ50 (HEMOSTASIS) ×2 IMPLANT
STRIP CLOSURE SKIN 1/2X4 (GAUZE/BANDAGES/DRESSINGS) IMPLANT
SUT VIC AB 0 CT1 18XCR BRD8 (SUTURE) ×1 IMPLANT
SUT VIC AB 0 CT1 8-18 (SUTURE) ×2
SUT VIC AB 2-0 CT1 18 (SUTURE) ×2 IMPLANT
SUT VIC AB 3-0 SH 8-18 (SUTURE) ×2 IMPLANT
SYR 20ML ECCENTRIC (SYRINGE) ×2 IMPLANT
TOWEL OR 17X24 6PK STRL BLUE (TOWEL DISPOSABLE) ×2 IMPLANT
TOWEL OR 17X26 10 PK STRL BLUE (TOWEL DISPOSABLE) ×2 IMPLANT
WATER STERILE IRR 1000ML POUR (IV SOLUTION) ×2 IMPLANT

## 2014-01-20 NOTE — Discharge Summary (Signed)
Physician Discharge Summary  Patient ID: Tammy MacadamVerna S Schwartz MRN: [id number] DOB/AGE: 59/11/1955 59 y.o.  Admit date: 01/20/2014 Discharge date: 01/20/2014  Admission Diagnoses:Lumbar spondylosis L4/5, right L5 radiculopathy  Discharge Diagnoses: Lumbar spondylosis L4/5, right L5 radiculopathy Active Problems:   Lumbar spondylosis   Discharged Condition: good  Hospital Course: Tammy Schwartz is a 58 y.o. female Whom was admitted to the hospital for an uncomplicated lumbar laminectomy of L4 for decompression of the L4,5 nerve roots. No discetomy was performed. Post op her wound is clean, dry, without signs of infection. She has voided, ambulated, and tolerated a regular diet. She has normal strength in all extremities, a normal cranial nerve exam, and normal sensation at discharge.   Consults: None  Significant Diagnostic Studies: none  Treatments: surgery: right L4/5 semihemilaminectomy and facetectomy for lateral recess stenosis, and spinal canal decompression  Discharge Exam: Blood pressure 110/59, pulse 67, temperature 97.7 F (36.5 C), temperature source Oral, resp. rate 20, weight 54.432 kg (120 lb), last menstrual period 03/13/2006, SpO2 93.00%. General appearance: alert, cooperative, appears stated age and no distress Neurologic: Alert and oriented X 3, normal strength and tone. Normal symmetric reflexes. Normal coordination and gait  Disposition: 01-Home or Self Care     Medication List    TAKE these medications       cyclobenzaprine 10 MG tablet  Commonly known as:  FLEXERIL  Take 1 tablet (10 mg total) by mouth 3 (three) times daily as needed for muscle spasms.     oxyCODONE-acetaminophen 5-325 MG per tablet  Commonly known as:  PERCOCET/ROXICET  Take 1-2 tablets by mouth every 6 (six) hours as needed for severe pain.      ASK your doctor about these medications       EXFORGE HCT 5-160-12.5 MG Tabs  Generic drug:  Amlodipine-Valsartan-HCTZ  Take 1 tablet  by mouth daily.     montelukast 10 MG tablet  Commonly known as:  SINGULAIR  Take 10 mg by mouth daily.     multivitamin with minerals Tabs tablet  Take 1 tablet by mouth daily.         Signed: Carmela HurtKyle L Brileigh Sevcik 01/20/2014, 3:55 PM

## 2014-01-20 NOTE — Anesthesia Preprocedure Evaluation (Addendum)
Anesthesia Evaluation  Patient identified by MRN, date of birth, ID band Patient awake    Reviewed: Allergy & Precautions, H&P , NPO status , Patient's Chart, lab work & pertinent test results  History of Anesthesia Complications (+) AWARENESS UNDER ANESTHESIA  Airway Mallampati: II TM Distance: >3 FB Neck ROM: Full    Dental  (+) Teeth Intact, Dental Advisory Given   Pulmonary shortness of breath and with exertion, Current Smoker,          Cardiovascular hypertension, Pt. on medications     Neuro/Psych    GI/Hepatic negative GI ROS, Neg liver ROS,   Endo/Other    Renal/GU negative Renal ROS     Musculoskeletal   Abdominal   Peds  Hematology   Anesthesia Other Findings   Reproductive/Obstetrics                         Anesthesia Physical Anesthesia Plan  ASA: III  Anesthesia Plan: General   Post-op Pain Management:    Induction: Intravenous  Airway Management Planned: Oral ETT  Additional Equipment:   Intra-op Plan:   Post-operative Plan: Extubation in OR  Informed Consent:   Dental advisory given  Plan Discussed with:   Anesthesia Plan Comments:         Anesthesia Quick Evaluation

## 2014-01-20 NOTE — Plan of Care (Signed)
Problem: Consults Goal: Diagnosis - Spinal Surgery Outcome: Completed/Met Date Met:  01/20/14 Microdiscectomy

## 2014-01-20 NOTE — Op Note (Signed)
01/20/2014  10:10 AM  PATIENT:  Tammy Schwartz  58 y.o. female with persistent pain in the right lower extremity after a discetomy at L5/S1. Despite the discetomy she continued to complain of pain in the right lower extremity. A repeat MRI did not show a recurrence, but stenosis and lateral recess stenosis at L4/5 bilaterally. I discussed operative decompression after she failed all conservative management she was willing to try, including therapy, steroids orally, epidural steroid injections, and time.   PRE-OPERATIVE DIAGNOSIS:  lumbar herniated disc lumbar spondylosis lumbar radiculopathy right L4/5, right L5 radiculopathy  POST-OPERATIVE DIAGNOSIS:  lumbar herniated disc lumbar spondylosis lumbar radiculopathy  PROCEDURE:  Procedure(s): LUMBAR FOUR TO LUMBAR FIVE LUMBAR LAMINECTOMY/DECOMPRESSION MICRODISCECTOMY 1 LEVEL  SURGEON:  Surgeon(s): Carmela HurtKyle L Devonta Blanford, MD Temple PaciniHenry A Pool, MD  ASSISTANTS:Pool, Sherilyn CooterHenry  ANESTHESIA:   general  EBL:  Total I/O In: 1600 [I.V.:1350; IV Piggyback:250] Out: 15 [Blood:15]  BLOOD ADMINISTERED:none    COUNT:per nursing  DRAINS: none   SPECIMEN:  No Specimen  DICTATION: [name] was taken to the operating room, intubated and placed under a general anesthetic without difficulty. She was positioned prone on a Wilson frame with all pressure points padded. Her back was prepped and draped in a sterile manner. I opened the skin with a 10 blade and carried the dissection down to the thoracolumbar fascia. I used both sharp dissection and the monopolar cautery to expose the lamina of L3,4, and, and L5. I confirmed my location with an intraoperative xray. I used the drill, Kerrison punches, and curettes to perform a semihemilaminectomy of L4. I used the punches to remove the ligamentum flavum to expose the thecal sac. I brought the microscope into the operative field and with Dr.Pool's assistance we started our decompression of the spinal canal, thecal  sac and L4, and L5 root(s). We used the Kerrison punches to remove ligament and open the lateral recess at L4/5. The ligament was quite thick and compressing the thecal sac, which expanded after the ligament was removed. We explored rostrally, laterally, medially, and caudally and were satisfied with the decompression. I irrigated the wound, then closed in layers. I approximated the thoracolumbar fascia, subcutaneous, and subcuticular planes with vicryl sutures. I used dermabond for a sterile dressing.   PLAN OF CARE: Admit for overnight observation  PATIENT DISPOSITION:  PACU - hemodynamically stable.   Delay start of Pharmacological VTE agent (>24hrs) due to surgical blood loss or risk of bleeding:  yes

## 2014-01-20 NOTE — Discharge Instructions (Signed)
Lumbar Discectomy °Care After °A discectomy involves removal of discmaterial (the cartilage-like structures located between the bones of the back). It is done to relieve pressure on nerve roots. It can be used as a treatment for a back problem. The time in surgery depends on the findings in surgery and what is necessary to correct the problems. °HOME CARE INSTRUCTIONS  °· Check the cut (incision) made by the surgeon twice a day for signs of infection. Some signs of infection may include:  °· A foul smelling, greenish or yellowish discharge from the wound.  °· Increased pain.  °· Increased redness over the incision (operative) site.  °· The skin edges may separate.  °· Flu-like symptoms (problems).  °· A temperature above 101.5° F (38.6° C).  °· Change your bandages in about 24 to 36 hours following surgery or as directed.  °· You may shower tomrrow.  Avoid bathtubs, swimming pools and hot tubs for three weeks or until your incision has healed completely. °· Follow your doctor's instructions as to safe activities, exercises, and physical therapy.  °· Weight reduction may be beneficial if you are overweight.  °· Daily exercise is helpful to prevent the return of problems. Walking is permitted. You may use a treadmill without an incline. Cut down on activities and exercise if you have discomfort. You may also go up and down stairs as much as you can tolerate.  °· DO NOT lift anything heavier than 10 to 15 lbs. Avoid bending or twisting at the waist. Always bend your knees when lifting.  °· Maintain strength and range of motion as instructed.  °· Do not drive for 10 days, or as directed by your doctors. You may be a passenger . Lying back in the passenger seat may be more comfortable for you. Always wear a seatbelt.  °· Limit your sitting in a regular chair to 20 to 30 minutes at a time. There are no limitations for sitting in a recliner. You should lie down or walk in between sitting periods.  °· Only take  over-the-counter or prescription medicines for pain, discomfort, or fever as directed by your caregiver.  °SEEK MEDICAL CARE IF:  °· There is increased bleeding (more than a small spot) from the wound.  °· You notice redness, swelling, or increasing pain in the wound.  °· Pus is coming from wound.  °· You develop an unexplained oral temperature above 102° F (38.9° C) develops.  °· You notice a foul smell coming from the wound or dressing.  °· You have increasing pain in your wound.  °SEEK IMMEDIATE MEDICAL CARE IF:  °· You develop a rash.  °· You have difficulty breathing.  °· You develop any allergic problems to medicines given.  °Document Released: 09/03/2004 Document Revised: 09/18/2011 Document Reviewed: 12/23/2007 °ExitCare® Patient Information ° ° ° °Wound Care °Leave incision open to air. °You may shower. °Do not scrub directly on incision.  °Do not put any creams, lotions, or ointments on incision. °Activity °Walk each and every day, increasing distance each day. °No lifting greater than 5 lbs.  Avoid bending, arching, and twisting. °No driving for 2 weeks; may ride as a passenger locally. °If provided with back brace, wear when out of bed.  It is not necessary to wear in bed. °Diet °Resume your normal diet.  °Return to Work °Will be discussed at you follow up appointment. °Call Your Doctor If Any of These Occur °Redness, drainage, or swelling at the wound.  °Temperature greater than   101 degrees. °Severe pain not relieved by pain medication. °Incision starts to come apart. °Follow Up Appt °Call today for appointment in 4 weeks ([phone]) or for problems.  If you have any hardware placed in your spine, you will need an x-ray before your appointment. °

## 2014-01-20 NOTE — Anesthesia Postprocedure Evaluation (Signed)
  Anesthesia Post-op Note  Patient: Tammy MacadamVerna S Schwartz  Procedure(s) Performed: Procedure(s) with comments: LUMBAR FOUR TO LUMBAR FIVE LUMBAR LAMINECTOMY/DECOMPRESSION MICRODISCECTOMY 1 LEVEL (Right) - Right L45 laminectomy and foramintomy  Patient Location: PACU  Anesthesia Type:General  Level of Consciousness: awake  Airway and Oxygen Therapy: Patient Spontanous Breathing  Post-op Pain: mild  Post-op Assessment: Post-op Vital signs reviewed  Post-op Vital Signs: Reviewed  Last Vitals:  Filed Vitals:   01/20/14 0617  BP: 123/76  Pulse: 72  Temp: 37 C  Resp: 20    Complications: No apparent anesthesia complications

## 2014-01-20 NOTE — Anesthesia Procedure Notes (Signed)
Procedure Name: Intubation Date/Time: 01/20/2014 8:22 AM Performed by: Alanda AmassFRIEDMAN, Khori Underberg A Pre-anesthesia Checklist: Patient identified, Timeout performed, Emergency Drugs available, Suction available and Patient being monitored Patient Re-evaluated:Patient Re-evaluated prior to inductionOxygen Delivery Method: Circle system utilized Preoxygenation: Pre-oxygenation with 100% oxygen Intubation Type: IV induction Ventilation: Mask ventilation without difficulty Laryngoscope Size: Mac and 3 Grade View: Grade III Tube type: Oral Tube size: 7.5 mm Number of attempts: 1 Airway Equipment and Method: Stylet Placement Confirmation: ETT inserted through vocal cords under direct vision,  breath sounds checked- equal and bilateral and positive ETCO2 Secured at: 21 cm Tube secured with: Tape Dental Injury: Teeth and Oropharynx as per pre-operative assessment

## 2014-01-20 NOTE — Transfer of Care (Signed)
Immediate Anesthesia Transfer of Care Note  Patient: Tammy MacadamVerna S Schwartz  Procedure(s) Performed: Procedure(s) with comments: LUMBAR FOUR TO LUMBAR FIVE LUMBAR LAMINECTOMY/DECOMPRESSION MICRODISCECTOMY 1 LEVEL (Right) - Right L45 laminectomy and foramintomy  Patient Location: PACU  Anesthesia Type:General  Level of Consciousness: sedated  Airway & Oxygen Therapy: Patient Spontanous Breathing  Post-op Assessment: Report given to PACU RN and Post -op Vital signs reviewed and stable  Post vital signs: Reviewed and stable  Complications: No apparent anesthesia complications

## 2014-01-20 NOTE — H&P (Signed)
BP 123/76  Pulse 72  Temp(Src) 98.6 F (37 C) (Oral)  Resp 20  Wt 54.432 kg (120 lb)  SpO2 100%  LMP 03/13/2006   Ms. Tammy Schwartz returns today.  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  She states that she is in so much pain and that this cannot be relieved anyway.  I'm willing to take her to surgery but I am somewhat reluctant.  The pathology is there, but it has been there every other time.  She just says "why can't you fix my right leg where I can fix my left leg."  I certainly will need to take her to the operating room for what I believe to be a very uncomplicated procedure.  She has agreed and we will get that setup.  Risks and benefits were explained and the fact that she may not get any pain relief was explained.  Past medical history, family history, and social history are unchanged from the last visit. No Known Allergies Prior to Admission medications   Medication Sig Start Date End Date Taking? Authorizing Provider  Amlodipine-Valsartan-HCTZ (EXFORGE HCT) 5-160-12.5 MG TABS Take 1 tablet by mouth daily.   Yes Historical Provider, MD  montelukast (SINGULAIR) 10 MG tablet Take 10 mg by mouth daily.    Yes Historical Provider, MD  Multiple Vitamin (MULTIVITAMIN WITH MINERALS) TABS Take 1 tablet by mouth daily.   Yes Historical Provider, MD  oxyCODONE-acetaminophen (PERCOCET/ROXICET) 5-325 MG per tablet Take 1 tablet by mouth every 6 (six) hours as needed for severe pain.   Yes Historical Provider, MD   History   Social History  . Marital Status: Married    Spouse Name: N/A    Number of Children: N/A  . Years of Education: N/A   Occupational History  . Not on file.   Social History Main Topics  . Smoking status: Current Every Day Smoker -- 0.25 packs/day for 20 years    Types: Cigarettes  . Smokeless tobacco: Never Used  . Alcohol Use: No  . Drug Use: No  . Sexual Activity: Not Currently    Birth Control/ Protection: Post-menopausal    Other Topics Concern  . Not on file   Social History Narrative  . No narrative on file   Family History  Problem Relation Age of Onset  . Cancer - Other Mother    Past Surgical History  Procedure Laterality Date  . Dilation and curettage of uterus    . Lumbar laminectomy/decompression microdiscectomy  06/04/2012    Procedure: LUMBAR LAMINECTOMY/DECOMPRESSION MICRODISCECTOMY 1 LEVEL;  Surgeon: Carmela HurtKyle L Emmaly Leech, MD;  Location: [facility];  Service: Neurosurgery;  Laterality: Left;  LEFT Lumbar five-sacral one diskectomy  . Lumbar laminectomy/decompression microdiscectomy Right 06/16/2013    Procedure: RIGHT Lumbar Five-Sacral One Microdiskectomy;  Surgeon: Carmela HurtKyle L Aleese Kamps, MD;  Location: [facility];  Service: Neurosurgery;  Laterality: Right;  RIGHT Lumbar Five-Sacral One Microdiskectomy  . Breast surgery Left     lumpectomy  . Back surgery    . Colonoscopy     Past Medical History  Diagnosis Date  . Sickle cell trait   . Hypertension     takes Exforge daily  . Hyperlipidemia     was on choloesterol meds last yr-samples given in office but not needed since  . Shortness of breath     takes Singulair daily;with exertion  . Pneumonia     hx of;many yrs ago  . Joint pain   .  Chronic back pain     HNp,spondylosis,radiculopathy  Physical Exam  Constitutional: She is oriented to person, place, and time. She appears well-developed and well-nourished.  HENT:  Head: Normocephalic and atraumatic.  Eyes: EOM are normal. Pupils are equal, round, and reactive to light.  Neck: Normal range of motion. Neck supple.  Cardiovascular: Normal rate, regular rhythm and normal heart sounds.   Pulmonary/Chest: Effort normal and breath sounds normal.  Abdominal: Soft. Bowel sounds are normal.  Musculoskeletal: Normal range of motion.  Neurological: She is alert and oriented to person, place, and time. She displays normal reflexes. No cranial nerve deficit. She exhibits normal muscle tone.  Coordination normal.  Skin: Skin is warm and dry.  Psychiatric: She has a normal mood and affect. Her behavior is normal. Judgment and thought content normal.

## 2014-01-23 ENCOUNTER — Encounter (HOSPITAL_COMMUNITY): Payer: Self-pay | Admitting: Neurosurgery

## 2014-08-23 ENCOUNTER — Other Ambulatory Visit: Payer: Self-pay

## 2014-08-23 DIAGNOSIS — Z1231 Encounter for screening mammogram for malignant neoplasm of breast: Secondary | ICD-10-CM

## 2014-09-29 ENCOUNTER — Ambulatory Visit
Admission: RE | Admit: 2014-09-29 | Discharge: 2014-09-29 | Disposition: A | Discharge status: Home / Self Care | Payer: BC Managed Care – PPO | Source: Ambulatory Visit

## 2014-09-29 DIAGNOSIS — Z1231 Encounter for screening mammogram for malignant neoplasm of breast: Secondary | ICD-10-CM

## 2015-04-25 ENCOUNTER — Emergency Department (INDEPENDENT_AMBULATORY_CARE_PROVIDER_SITE_OTHER)
Admission: EM | Admit: 2015-04-25 | Discharge: 2015-04-25 | Disposition: A | Discharge status: Home / Self Care | Payer: BLUE CROSS/BLUE SHIELD | Source: Home / Self Care | Attending: Family Medicine | Admitting: Family Medicine

## 2015-04-25 ENCOUNTER — Encounter (HOSPITAL_COMMUNITY): Payer: Self-pay | Admitting: *Deleted

## 2015-04-25 DIAGNOSIS — M549 Dorsalgia, unspecified: Secondary | ICD-10-CM | POA: Diagnosis not present

## 2015-04-25 DIAGNOSIS — G8929 Other chronic pain: Secondary | ICD-10-CM | POA: Diagnosis not present

## 2015-04-25 MED ORDER — DICLOFENAC POTASSIUM 50 MG PO TABS: 50.0000 mg | ORAL_TABLET | Freq: Three times a day (TID) | ORAL | Status: DC

## 2015-04-25 MED ORDER — METHOCARBAMOL 500 MG PO TABS: 500.0000 mg | ORAL_TABLET | Freq: Three times a day (TID) | ORAL | Status: DC

## 2015-04-25 NOTE — ED Notes (Signed)
Pt  Reports  Back  And  r  Leg  Pain  X  6  Months        denys  Any  specefic  Injury      Sitting  Upright on  The  Exam table speaking in  Complete  sentances     And  Is  In no  Severe  Distress

## 2015-04-25 NOTE — ED Provider Notes (Signed)
CSN: [id number]     Arrival date & time 04/25/15  1825 History   First MD Initiated Contact with Patient 04/25/15 1941     Chief Complaint  Patient presents with  . Back Pain   (Consider location/radiation/quality/duration/timing/severity/associated sxs/prior Treatment) Patient is a 59 y.o. female presenting with back pain. The history is provided by the patient.  Back Pain Location:  Lumbar spine Quality:  Shooting Radiates to:  R posterior upper leg Pain severity:  Moderate Onset quality:  Gradual Duration:  6 months Progression:  Unchanged Chronicity:  Chronic Context: not falling and not recent injury   Context comment:  Dr. Franky Machocabbell operated x 4 on back   Past Medical History  Diagnosis Date  . Sickle cell trait   . Hypertension     takes Exforge daily  . Hyperlipidemia     was on choloesterol meds last yr-samples given in office but not needed since  . Shortness of breath     takes Singulair daily;with exertion  . Pneumonia     hx of;many yrs ago  . Joint pain   . Chronic back pain     HNp,spondylosis,radiculopathy   Past Surgical History  Procedure Laterality Date  . Dilation and curettage of uterus    . Lumbar laminectomy/decompression microdiscectomy  06/04/2012    Procedure: LUMBAR LAMINECTOMY/DECOMPRESSION MICRODISCECTOMY 1 LEVEL;  Surgeon: Carmela HurtKyle L Cabbell, MD;  Location: [facility];  Service: Neurosurgery;  Laterality: Left;  LEFT Lumbar five-sacral one diskectomy  . Lumbar laminectomy/decompression microdiscectomy Right 06/16/2013    Procedure: RIGHT Lumbar Five-Sacral One Microdiskectomy;  Surgeon: Carmela HurtKyle L Cabbell, MD;  Location: [facility];  Service: Neurosurgery;  Laterality: Right;  RIGHT Lumbar Five-Sacral One Microdiskectomy  . Breast surgery Left     lumpectomy  . Back surgery    . Colonoscopy    . Lumbar laminectomy/decompression microdiscectomy Right 01/20/2014    Procedure: LUMBAR FOUR TO LUMBAR FIVE LUMBAR LAMINECTOMY/DECOMPRESSION MICRODISCECTOMY  1 LEVEL;  Surgeon: Carmela HurtKyle L Cabbell, MD;  Location: [facility];  Service: Neurosurgery;  Laterality: Right;  Right L45 laminectomy and foramintomy   Family History  Problem Relation Age of Onset  . Cancer - Other Mother    History  Substance Use Topics  . Smoking status: Current Every Day Smoker -- 0.25 packs/day for 20 years    Types: Cigarettes  . Smokeless tobacco: Never Used  . Alcohol Use: No   OB History    No data available     Review of Systems  Constitutional: Negative.   Gastrointestinal: Negative.   Genitourinary: Negative for flank pain.  Musculoskeletal: Positive for myalgias and back pain. Negative for joint swelling and gait problem.    Allergies  Review of patient's allergies indicates no known allergies.  Home Medications   Prior to Admission medications   Medication Sig Start Date End Date Taking? Authorizing Provider  Amlodipine-Valsartan-HCTZ (EXFORGE HCT) 5-160-12.5 MG TABS Take 1 tablet by mouth daily.    Historical Provider, MD  cyclobenzaprine (FLEXERIL) 10 MG tablet Take 1 tablet (10 mg total) by mouth 3 (three) times daily as needed for muscle spasms. 01/20/14   Coletta MemosKyle Cabbell, MD  diclofenac (CATAFLAM) 50 MG tablet Take 1 tablet (50 mg total) by mouth 3 (three) times daily. For pain 04/25/15   Linna HoffJames D Kindl, MD  methocarbamol (ROBAXIN) 500 MG tablet Take 1 tablet (500 mg total) by mouth 3 (three) times daily. 04/25/15   Linna HoffJames D Kindl, MD  montelukast (SINGULAIR) 10 MG tablet Take  10 mg by mouth daily.     Historical Provider, MD  Multiple Vitamin (MULTIVITAMIN WITH MINERALS) TABS Take 1 tablet by mouth daily.    Historical Provider, MD  oxyCODONE-acetaminophen (PERCOCET/ROXICET) 5-325 MG per tablet Take 1-2 tablets by mouth every 6 (six) hours as needed for severe pain. 01/20/14   Coletta Memos, MD   BP 98/65 mmHg  Pulse 87  Temp(Src) 98.7 F (37.1 C) (Oral)  Resp 16  SpO2 97%  LMP 03/13/2006 Physical Exam  Constitutional: She is oriented to person,  place, and time. She appears well-developed and well-nourished.  Abdominal: Soft. Bowel sounds are normal.  Musculoskeletal: She exhibits tenderness.  Lumbar back scar.  Neurological: She is alert and oriented to person, place, and time.  Skin: Skin is warm and dry.  Nursing note and vitals reviewed.   ED Course  Procedures (including critical care time) Labs Review Labs Reviewed - No data to display  Imaging Review No results found.   MDM   1. Chronic back pain greater than 3 months duration        Linna Hoff, MD 04/25/15 [phone]

## 2015-04-25 NOTE — Discharge Instructions (Signed)
See your doctor if further back problems.

## 2015-08-27 ENCOUNTER — Other Ambulatory Visit: Payer: Self-pay

## 2015-08-27 DIAGNOSIS — Z1231 Encounter for screening mammogram for malignant neoplasm of breast: Secondary | ICD-10-CM

## 2015-10-02 ENCOUNTER — Ambulatory Visit
Admission: RE | Admit: 2015-10-02 | Discharge: 2015-10-02 | Disposition: A | Discharge status: Home / Self Care | Payer: BLUE CROSS/BLUE SHIELD | Source: Ambulatory Visit

## 2015-10-02 DIAGNOSIS — Z1231 Encounter for screening mammogram for malignant neoplasm of breast: Secondary | ICD-10-CM

## 2015-12-02 ENCOUNTER — Emergency Department (HOSPITAL_COMMUNITY)
Admission: EM | Admit: 2015-12-02 | Discharge: 2015-12-02 | Discharge status: Absent without leave | Payer: Managed Care, Other (non HMO) | Source: Home / Self Care

## 2016-01-10 ENCOUNTER — Ambulatory Visit (INDEPENDENT_AMBULATORY_CARE_PROVIDER_SITE_OTHER): Payer: Managed Care, Other (non HMO) | Admitting: Podiatry

## 2016-01-10 VITALS — BP 112/73 | HR 93 | Resp 14

## 2016-01-10 DIAGNOSIS — L02612 Cutaneous abscess of left foot: Secondary | ICD-10-CM | POA: Diagnosis not present

## 2016-01-10 DIAGNOSIS — T300 Burn of unspecified body region, unspecified degree: Secondary | ICD-10-CM

## 2016-01-10 DIAGNOSIS — L03032 Cellulitis of left toe: Secondary | ICD-10-CM | POA: Diagnosis not present
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
## 2016-05-06 ENCOUNTER — Emergency Department (HOSPITAL_COMMUNITY): Payer: Managed Care, Other (non HMO)

## 2016-05-06 ENCOUNTER — Encounter (HOSPITAL_COMMUNITY): Payer: Self-pay | Admitting: Emergency Medicine

## 2016-05-06 DIAGNOSIS — M545 Low back pain: Secondary | ICD-10-CM | POA: Insufficient documentation

## 2016-05-06 DIAGNOSIS — R0789 Other chest pain: Principal | ICD-10-CM | POA: Insufficient documentation

## 2016-05-06 DIAGNOSIS — I10 Essential (primary) hypertension: Secondary | ICD-10-CM | POA: Insufficient documentation

## 2016-05-06 DIAGNOSIS — Z79899 Other long term (current) drug therapy: Secondary | ICD-10-CM | POA: Insufficient documentation

## 2016-05-06 DIAGNOSIS — E785 Hyperlipidemia, unspecified: Secondary | ICD-10-CM | POA: Diagnosis not present

## 2016-05-06 DIAGNOSIS — F17210 Nicotine dependence, cigarettes, uncomplicated: Secondary | ICD-10-CM | POA: Insufficient documentation

## 2016-05-06 DIAGNOSIS — J449 Chronic obstructive pulmonary disease, unspecified: Secondary | ICD-10-CM | POA: Insufficient documentation

## 2016-05-06 LAB — CBC
HCT: 40.5 % (ref 36.0–46.0)
Hemoglobin: 14.1 g/dL (ref 12.0–15.0)
MCH: 28.8 pg (ref 26.0–34.0)
MCHC: 34.8 g/dL (ref 30.0–36.0)
MCV: 82.8 fL (ref 78.0–100.0)
PLATELETS: 291 10*3/uL (ref 150–400)
RBC: 4.89 MIL/uL (ref 3.87–5.11)
RDW: 15.6 % — ABNORMAL HIGH (ref 11.5–15.5)
WBC: 11 10*3/uL — ABNORMAL HIGH (ref 4.0–10.5)

## 2016-05-06 LAB — BASIC METABOLIC PANEL
Anion gap: 9 (ref 5–15)
BUN: 9 mg/dL (ref 6–20)
CHLORIDE: 106 mmol/L (ref 101–111)
CO2: 21 mmol/L — AB (ref 22–32)
CREATININE: 1.19 mg/dL — AB (ref 0.44–1.00)
Calcium: 9.9 mg/dL (ref 8.9–10.3)
GFR calc non Af Amer: 48 mL/min — ABNORMAL LOW (ref >60)
GFR, EST AFRICAN AMERICAN: 56 mL/min — AB (ref >60)
GLUCOSE: 105 mg/dL — AB (ref 65–99)
Potassium: 4 mmol/L (ref 3.5–5.1)
Sodium: 136 mmol/L (ref 135–145)

## 2016-05-06 LAB — I-STAT TROPONIN, ED: Troponin i, poc: 0 ng/mL (ref 0.00–0.08)

## 2016-05-06 NOTE — ED Triage Notes (Signed)
Patient reports left chest pain radiating to mid back with SOB , productive cough onset this morning , denies nausea or diaphoresis .

## 2016-05-07 ENCOUNTER — Observation Stay (HOSPITAL_COMMUNITY)
Admission: EM | Admit: 2016-05-07 | Discharge: 2016-05-08 | Disposition: A | Discharge status: Home / Self Care | Payer: Managed Care, Other (non HMO) | Attending: Emergency Medicine | Admitting: Emergency Medicine

## 2016-05-07 ENCOUNTER — Encounter (HOSPITAL_COMMUNITY): Payer: Self-pay | Admitting: Internal Medicine

## 2016-05-07 ENCOUNTER — Observation Stay (HOSPITAL_COMMUNITY): Payer: Managed Care, Other (non HMO)

## 2016-05-07 ENCOUNTER — Emergency Department (HOSPITAL_COMMUNITY): Payer: Managed Care, Other (non HMO)

## 2016-05-07 ENCOUNTER — Observation Stay (HOSPITAL_BASED_OUTPATIENT_CLINIC_OR_DEPARTMENT_OTHER): Payer: Managed Care, Other (non HMO)

## 2016-05-07 DIAGNOSIS — J209 Acute bronchitis, unspecified: Secondary | ICD-10-CM

## 2016-05-07 DIAGNOSIS — M47816 Spondylosis without myelopathy or radiculopathy, lumbar region: Secondary | ICD-10-CM | POA: Diagnosis present

## 2016-05-07 DIAGNOSIS — J449 Chronic obstructive pulmonary disease, unspecified: Secondary | ICD-10-CM | POA: Diagnosis not present

## 2016-05-07 DIAGNOSIS — N179 Acute kidney failure, unspecified: Secondary | ICD-10-CM

## 2016-05-07 DIAGNOSIS — Z720 Tobacco use: Secondary | ICD-10-CM | POA: Diagnosis not present

## 2016-05-07 DIAGNOSIS — E785 Hyperlipidemia, unspecified: Secondary | ICD-10-CM

## 2016-05-07 DIAGNOSIS — R079 Chest pain, unspecified: Secondary | ICD-10-CM

## 2016-05-07 DIAGNOSIS — I10 Essential (primary) hypertension: Secondary | ICD-10-CM

## 2016-05-07 DIAGNOSIS — M4300 Spondylolysis, site unspecified: Secondary | ICD-10-CM

## 2016-05-07 HISTORY — DX: Tobacco use: Z72.0

## 2016-05-07 HISTORY — DX: Chronic obstructive pulmonary disease, unspecified: J44.9

## 2016-05-07 HISTORY — DX: Spondylolysis, site unspecified: M43.00

## 2016-05-07 LAB — CREATININE, SERUM: Creatinine, Ser: 0.96 mg/dL (ref 0.44–1.00)

## 2016-05-07 LAB — LIPID PANEL
CHOLESTEROL: 187 mg/dL (ref 0–200)
HDL: 81 mg/dL (ref >40)
LDL Cholesterol: 86 mg/dL (ref 0–99)
Total CHOL/HDL Ratio: 2.3 RATIO
Triglycerides: 100 mg/dL (ref <150)
VLDL: 20 mg/dL (ref 0–40)

## 2016-05-07 LAB — CBC
HEMATOCRIT: 35.8 % — AB (ref 36.0–46.0)
HEMOGLOBIN: 12.1 g/dL (ref 12.0–15.0)
MCH: 28.2 pg (ref 26.0–34.0)
MCHC: 33.8 g/dL (ref 30.0–36.0)
MCV: 83.4 fL (ref 78.0–100.0)
Platelets: 232 10*3/uL (ref 150–400)
RBC: 4.29 MIL/uL (ref 3.87–5.11)
RDW: 16 % — AB (ref 11.5–15.5)
WBC: 11.8 10*3/uL — AB (ref 4.0–10.5)

## 2016-05-07 LAB — ECHOCARDIOGRAM COMPLETE
HEIGHTINCHES: 61 in
WEIGHTICAEL: 2109.36 [oz_av]

## 2016-05-07 LAB — TROPONIN I: Troponin I: <0.03 ng/mL (ref <0.03)

## 2016-05-07 MED ORDER — ONDANSETRON HCL 4 MG/2ML IJ SOLN: 4.0000 mg | Freq: Four times a day (QID) | INTRAMUSCULAR | Status: DC | PRN

## 2016-05-07 MED ORDER — TECHNETIUM TC 99M TETROFOSMIN IV KIT
30.0000 | PACK | Freq: Once | INTRAVENOUS | Status: AC | PRN
  Administered 2016-05-07: 30 via INTRAVENOUS

## 2016-05-07 MED ORDER — IRBESARTAN 150 MG PO TABS: 150.0000 mg | ORAL_TABLET | Freq: Every day | ORAL | Status: DC

## 2016-05-07 MED ORDER — IPRATROPIUM-ALBUTEROL 0.5-2.5 (3) MG/3ML IN SOLN
3.0000 mL | RESPIRATORY_TRACT | Status: AC
  Administered 2016-05-07 (×3): 3 mL via RESPIRATORY_TRACT
  Filled 2016-05-07 (×3): qty 3

## 2016-05-07 MED ORDER — NICOTINE 14 MG/24HR TD PT24
14.0000 mg | MEDICATED_PATCH | Freq: Every day | TRANSDERMAL | Status: DC
  Administered 2016-05-07 – 2016-05-08 (×2): 14 mg via TRANSDERMAL
  Filled 2016-05-07 (×3): qty 1

## 2016-05-07 MED ORDER — MONTELUKAST SODIUM 10 MG PO TABS
10.0000 mg | ORAL_TABLET | Freq: Every day | ORAL | Status: DC
  Administered 2016-05-07 – 2016-05-08 (×2): 10 mg via ORAL
  Filled 2016-05-07 (×2): qty 1

## 2016-05-07 MED ORDER — ACETAMINOPHEN 325 MG PO TABS
650.0000 mg | ORAL_TABLET | ORAL | Status: DC | PRN
  Administered 2016-05-07 – 2016-05-08 (×2): 650 mg via ORAL
  Filled 2016-05-07 (×2): qty 2

## 2016-05-07 MED ORDER — SODIUM CHLORIDE 0.9 % IV SOLN
Freq: Once | INTRAVENOUS | Status: AC
  Administered 2016-05-07: 250 mL via INTRAVENOUS

## 2016-05-07 MED ORDER — GI COCKTAIL ~~LOC~~
30.0000 mL | Freq: Four times a day (QID) | ORAL | Status: DC | PRN
  Administered 2016-05-08: 30 mL via ORAL
  Filled 2016-05-07: qty 30

## 2016-05-07 MED ORDER — HYDROCHLOROTHIAZIDE 12.5 MG PO CAPS: 12.5000 mg | ORAL_CAPSULE | Freq: Every day | ORAL | Status: DC

## 2016-05-07 MED ORDER — IOPAMIDOL (ISOVUE-370) INJECTION 76%
INTRAVENOUS | Status: AC
  Administered 2016-05-07: 100 mL
  Filled 2016-05-07: qty 100

## 2016-05-07 MED ORDER — SODIUM CHLORIDE 0.9 % IV SOLN
INTRAVENOUS | Status: AC
  Administered 2016-05-07: 08:00:00 via INTRAVENOUS

## 2016-05-07 MED ORDER — ZOLPIDEM TARTRATE 5 MG PO TABS
5.0000 mg | ORAL_TABLET | Freq: Every evening | ORAL | Status: DC | PRN
  Administered 2016-05-07: 5 mg via ORAL
  Filled 2016-05-07: qty 1

## 2016-05-07 MED ORDER — KETOROLAC TROMETHAMINE 15 MG/ML IJ SOLN
15.0000 mg | Freq: Three times a day (TID) | INTRAMUSCULAR | Status: DC | PRN
  Administered 2016-05-07 – 2016-05-08 (×2): 15 mg via INTRAVENOUS
  Filled 2016-05-07 (×2): qty 1

## 2016-05-07 MED ORDER — OXYCODONE HCL 5 MG PO TABS
10.0000 mg | ORAL_TABLET | Freq: Once | ORAL | Status: AC
  Administered 2016-05-07: 10 mg via ORAL
  Filled 2016-05-07: qty 2

## 2016-05-07 MED ORDER — ALPRAZOLAM 0.25 MG PO TABS
0.2500 mg | ORAL_TABLET | Freq: Three times a day (TID) | ORAL | Status: DC | PRN
  Administered 2016-05-08 (×2): 0.25 mg via ORAL
  Filled 2016-05-07 (×3): qty 1

## 2016-05-07 MED ORDER — OXYCODONE-ACETAMINOPHEN 5-325 MG PO TABS: 1.0000 | ORAL_TABLET | Freq: Four times a day (QID) | ORAL | Status: DC | PRN

## 2016-05-07 MED ORDER — AMLODIPINE-VALSARTAN-HCTZ 5-160-12.5 MG PO TABS: 1.0000 | ORAL_TABLET | Freq: Every day | ORAL | Status: DC

## 2016-05-07 MED ORDER — REGADENOSON 0.4 MG/5ML IV SOLN
INTRAVENOUS | Status: AC
  Administered 2016-05-07: 0.4 mg via INTRAVENOUS
  Filled 2016-05-07: qty 5

## 2016-05-07 MED ORDER — ENOXAPARIN SODIUM 40 MG/0.4ML ~~LOC~~ SOLN
40.0000 mg | SUBCUTANEOUS | Status: DC
  Administered 2016-05-07 – 2016-05-08 (×2): 40 mg via SUBCUTANEOUS
  Filled 2016-05-07 (×2): qty 0.4

## 2016-05-07 MED ORDER — ATORVASTATIN CALCIUM 10 MG PO TABS
10.0000 mg | ORAL_TABLET | Freq: Every day | ORAL | Status: DC
  Administered 2016-05-07 – 2016-05-08 (×2): 10 mg via ORAL
  Filled 2016-05-07 (×2): qty 1

## 2016-05-07 MED ORDER — ASPIRIN 325 MG PO TABS
325.0000 mg | ORAL_TABLET | Freq: Every day | ORAL | Status: DC
Start: 2016-05-07 — End: 2016-05-08
  Administered 2016-05-07 – 2016-05-08 (×2): 325 mg via ORAL
  Filled 2016-05-07 (×2): qty 1

## 2016-05-07 MED ORDER — REGADENOSON 0.4 MG/5ML IV SOLN
0.4000 mg | Freq: Once | INTRAVENOUS | Status: AC
  Administered 2016-05-07: 0.4 mg via INTRAVENOUS
  Filled 2016-05-07: qty 5

## 2016-05-07 MED ORDER — AMLODIPINE BESYLATE 5 MG PO TABS
5.0000 mg | ORAL_TABLET | Freq: Every day | ORAL | Status: DC
  Administered 2016-05-07: 5 mg via ORAL
  Filled 2016-05-07: qty 1

## 2016-05-07 MED ORDER — TECHNETIUM TC 99M TETROFOSMIN IV KIT
10.0000 | PACK | Freq: Once | INTRAVENOUS | Status: AC | PRN
  Administered 2016-05-07: 10 via INTRAVENOUS

## 2016-05-07 MED ORDER — SODIUM CHLORIDE 0.9 % IV BOLUS (SEPSIS)
250.0000 mL | Freq: Once | INTRAVENOUS | Status: AC
  Administered 2016-05-07: 250 mL via INTRAVENOUS

## 2016-05-07 MED ORDER — HYDROXYZINE HCL 25 MG PO TABS
25.0000 mg | ORAL_TABLET | Freq: Once | ORAL | Status: AC
  Administered 2016-05-07: 25 mg via ORAL
  Filled 2016-05-07: qty 1

## 2016-05-07 MED ORDER — SODIUM CHLORIDE 0.9 % IV BOLUS (SEPSIS)
1000.0000 mL | Freq: Once | INTRAVENOUS | Status: AC
Start: 2016-05-07 — End: 2016-05-07
  Administered 2016-05-07: 1000 mL via INTRAVENOUS

## 2016-05-07 MED ORDER — GI COCKTAIL ~~LOC~~
30.0000 mL | Freq: Once | ORAL | Status: AC
  Administered 2016-05-07: 30 mL via ORAL
  Filled 2016-05-07: qty 30

## 2016-05-07 MED ORDER — MORPHINE SULFATE (PF) 2 MG/ML IV SOLN
2.0000 mg | INTRAVENOUS | Status: DC | PRN
  Administered 2016-05-07: 2 mg via INTRAVENOUS
  Filled 2016-05-07: qty 1

## 2016-05-07 MED ORDER — DIPHENHYDRAMINE HCL 25 MG PO CAPS
25.0000 mg | ORAL_CAPSULE | Freq: Once | ORAL | Status: AC
  Administered 2016-05-07: 25 mg via ORAL
  Filled 2016-05-07: qty 1

## 2016-05-07 NOTE — ED Provider Notes (Signed)
[facility]-EMERGENCY DEPT Provider Note   CSN: [id number] Arrival date & time: 05/06/16  2121  First Provider Contact:  First MD Initiated Contact with Patient 05/07/16 0300      History   Chief Complaint Chief Complaint  Patient presents with  . Chest Pain    HPI Tammy Schwartz is a 60 y.o. female.  Tammy Schwartz is a 60 y.o. female  with a hx of sickle cell trait, hypertension, hyperlipidemia, pneumonia, chronic back pain presents to the Emergency Department complaining of gradual, persistent, progressively worsening chest pain onset this morning after waking from sleep. She reports the pain has been constant.. She reports the pain is located in her left chest, is described as cutting and radiates to her left shoulder and back. She rates her pain at a 7 out of 10. No treatments prior to arrival today. No aggravating or alleviating factors. Patient reports some associated shortness of breath. Nursing reports associated cough however patient denies regular cough for me. No diaphoresis, nausea, vomiting, syncope, numbness, tingling, headache, vision changes.   Pt also c/o low back pain. She has chronic low back pain and has had several surgeries by Dr. Tammi Klippel. She reports approximately 2 weeks ago she fell down 2 stairs. She reports her back pain has been worse since that time. She reports that for many months she has had pain and paresthesias radiating down the back of her legs. This is not new and did not worsen after the fall.  No loss of bowel or bladder control. No saddle anesthesia.   The history is provided by the patient and medical records. No language interpreter was used.    Past Medical History:  Diagnosis Date  . Chronic back pain    HNp,spondylosis,radiculopathy  . Hyperlipidemia    was on choloesterol meds last yr-samples given in office but not needed since  . Hypertension    takes Exforge daily  . Joint pain   . Pneumonia    hx of;many yrs ago  . Shortness  of breath    takes Singulair daily;with exertion  . Sickle cell trait [facility])     Patient Active Problem List   Diagnosis Date Noted  . Chest pain 05/07/2016  . Lumbar spondylosis 01/20/2014  . HNP (herniated nucleus pulposus), lumbar 06/04/2012    Past Surgical History:  Procedure Laterality Date  . BACK SURGERY    . BREAST SURGERY Left    lumpectomy  . COLONOSCOPY    . DILATION AND CURETTAGE OF UTERUS    . LUMBAR LAMINECTOMY/DECOMPRESSION MICRODISCECTOMY  06/04/2012   Procedure: LUMBAR LAMINECTOMY/DECOMPRESSION MICRODISCECTOMY 1 LEVEL;  Surgeon: Carmela Hurt, MD;  Location: [facility];  Service: Neurosurgery;  Laterality: Left;  LEFT Lumbar five-sacral one diskectomy  . LUMBAR LAMINECTOMY/DECOMPRESSION MICRODISCECTOMY Right 06/16/2013   Procedure: RIGHT Lumbar Five-Sacral One Microdiskectomy;  Surgeon: Carmela Hurt, MD;  Location: [facility];  Service: Neurosurgery;  Laterality: Right;  RIGHT Lumbar Five-Sacral One Microdiskectomy  . LUMBAR LAMINECTOMY/DECOMPRESSION MICRODISCECTOMY Right 01/20/2014   Procedure: LUMBAR FOUR TO LUMBAR FIVE LUMBAR LAMINECTOMY/DECOMPRESSION MICRODISCECTOMY 1 LEVEL;  Surgeon: Carmela Hurt, MD;  Location: [facility];  Service: Neurosurgery;  Laterality: Right;  Right L45 laminectomy and foramintomy    OB History    No data available       Home Medications    Prior to Admission medications   Medication Sig Start Date End Date Taking? Authorizing Provider  Amlodipine-Valsartan-HCTZ (EXFORGE HCT) 5-160-12.5 MG TABS Take 1 tablet by  mouth daily.   Yes Historical Provider, MD  cyclobenzaprine (FLEXERIL) 10 MG tablet Take 1 tablet (10 mg total) by mouth 3 (three) times daily as needed for muscle spasms. 01/20/14  Yes Coletta Memos, MD  montelukast (SINGULAIR) 10 MG tablet Take 10 mg by mouth daily.    Yes Historical Provider, MD  Multiple Vitamin (MULTIVITAMIN WITH MINERALS) TABS Take 1 tablet by mouth daily.   Yes Historical Provider, MD    oxyCODONE-acetaminophen (PERCOCET/ROXICET) 5-325 MG per tablet Take 1-2 tablets by mouth every 6 (six) hours as needed for severe pain. 01/20/14  Yes Coletta Memos, MD  permethrin (ELIMITE) 5 % cream Apply 1 application topically daily.   Yes Historical Provider, MD  sulfamethoxazole-trimethoprim (BACTRIM DS,SEPTRA DS) 800-160 MG tablet Take 1 tablet by mouth 2 (two) times daily.   Yes Historical Provider, MD    Family History Family History  Problem Relation Age of Onset  . Cancer - Other Mother     Social History Social History  Substance Use Topics  . Smoking status: Current Every Day Smoker    Packs/day: 0.25    Years: 20.00    Types: Cigarettes  . Smokeless tobacco: Never Used  . Alcohol use No     Allergies   Review of patient's allergies indicates no known allergies.   Review of Systems Review of Systems  Respiratory: Positive for cough and chest tightness.   Cardiovascular: Positive for chest pain.  Musculoskeletal: Positive for back pain (low back).  All other systems reviewed and are negative.    Physical Exam Updated Vital Signs BP 126/87   Pulse 83   Temp 99.9 F (37.7 C) (Oral)   Resp 12   LMP 03/13/2006   SpO2 98%   Physical Exam  Constitutional: She appears well-developed and well-nourished. No distress.  Awake, alert, nontoxic appearance  HENT:  Head: Normocephalic and atraumatic.  Mouth/Throat: Oropharynx is clear and moist. No oropharyngeal exudate.  Eyes: Conjunctivae are normal. No scleral icterus.  Neck: Normal range of motion. Neck supple.  Cardiovascular: Normal rate, regular rhythm and intact distal pulses.   Pulmonary/Chest: Effort normal and breath sounds normal. No respiratory distress. She has no wheezes.  Equal chest expansion  Abdominal: Soft. Bowel sounds are normal. She exhibits no mass. There is no tenderness. There is no rebound and no guarding.  Musculoskeletal: Normal range of motion. She exhibits no edema.  Neurological:  She is alert.  Speech is clear and goal oriented Moves extremities without ataxia  Skin: Skin is warm and dry. She is not diaphoretic.  Psychiatric: She has a normal mood and affect.  Nursing note and vitals reviewed.    ED Treatments / Results  Labs (all labs ordered are listed, but only abnormal results are displayed) Labs Reviewed  BASIC METABOLIC PANEL - Abnormal; Notable for the following:       Result Value   CO2 21 (*)    Glucose, Bld 105 (*)    Creatinine, Ser 1.19 (*)    GFR calc non Af Amer 48 (*)    GFR calc Af Amer 56 (*)    All other components within normal limits  CBC - Abnormal; Notable for the following:    WBC 11.0 (*)    RDW 15.6 (*)    All other components within normal limits  Rosezena Sensor, ED    EKG  EKG Interpretation  Date/Time:  Tuesday May 06 2016 21:30:27 EDT Ventricular Rate:  118 PR Interval:  136 QRS Duration: 66  QT Interval:  298 QTC Calculation: 417 R Axis:   -20 Text Interpretation:  Sinus tachycardia with frequent Premature ventricular complexes Otherwise normal ECG No significant change since last tracing Confirmed by Erroll Luna [phone]) on 05/07/2016 5:57:08 AM       Radiology Dg Chest 2 View  Result Date: 05/06/2016 CLINICAL DATA:  Chest pain and shortness of breath for 1 day EXAM: CHEST  2 VIEW COMPARISON:  06/16/2013 FINDINGS: Cardiac shadow is within normal limits. The lungs are hyperinflated consistent with COPD. Date density is noted over the anterior aspect of the left second rib which appears to be related to the rib and is has been seen on prior exams. No focal infiltrate is seen. No other bony abnormality is noted. IMPRESSION: COPD without acute abnormality. Density over the anterior aspect of the left second rib which is felt to be related to the underlying rib end. Short-term followup may be helpful to assess for stability. Electronically Signed   By: Alcide Clever M.D.   On: 05/06/2016 22:17  Dg Lumbar Spine  Complete  Result Date: 05/07/2016 CLINICAL DATA:  Initial evaluation for acute trauma, fall 10 days ago. Low back pain. EXAM: LUMBAR SPINE - COMPLETE 4+ VIEW COMPARISON:  Prior radiograph from 01/20/2014. FINDINGS: Dextroscoliosis. Vertebral bodies otherwise normally aligned with preservation of the normal lumbar lordosis. Vertebral body heights preserved. No acute fracture subluxation. Visualized sacrum intact. Degenerative spondylolysis present at L4-5 and L5-S1 with prominent reactive endplate sclerosis at L5-S1. Bilateral facet arthrosis present these levels as well. No acute soft tissue abnormality. Secrete did IV contrast material within the right urinary collecting system and bladder. IMPRESSION: 1. No radiographic evidence for acute traumatic injury within the lumbar spine. 2. Dextroscoliosis with moderate to advanced degenerative spondylolysis at L4-5 and L5-S1. Electronically Signed   By: Rise Mu M.D.   On: 05/07/2016 05:41  Ct Angio Chest/abd/pel For Dissection W And/or W/wo  Result Date: 05/07/2016 CLINICAL DATA:  60 year old female with chest pain radiating to the mid back. Shortness of breath. Concern for dissection. EXAM: CT ANGIO CHEST-ABD-PELV FOR DISSECTION W/ AND WO/W CM TECHNIQUE: Serial transaxial images from the thoracic inlet to the symphysis pubis was performed in 2 mm slice thickness. Coronal and sagittal reformatted images are provided. The study was performed following intravenous administration of contrast. CONTRAST:  100 cc Isovue 370 COMPARISON:  Chest radiograph dated 05/06/2016 FINDINGS: CTA chest: There is mild centrilobular emphysema. Bibasilar dependent atelectatic changes as well as linear scarring noted. A patchy area of interstitial prominence and ground-glass density in the right upper lobe may represent combination of atelectasis and chronic interstitial scarring. Superimposed pneumonia is not excluded. Clinical correlation is recommended. There is no  pleural effusion or pneumothorax. The central airways are patent. There is mild atherosclerotic calcification of the thoracic aorta. There is no evidence of dissection or aneurysm. The origins of the great vessels of the aortic arch appear patent. There is no CT evidence of pulmonary embolism. There is no cardiomegaly or pericardial effusion. Top-normal bilateral hilar lymph nodes. The esophagus is grossly unremarkable. No thyroid nodules identified. There is no axillary or supraclavicular adenopathy. The chest wall soft tissues appear unremarkable. The osseous structures are intact. CTA abdomen and pelvis: No intra-abdominal free air or free fluid. The liver, gallbladder, pancreas, spleen, adrenal glands, kidneys, visualized ureters, and urinary bladder appear unremarkable. The uterus is anteverted and grossly unremarkable. The ovaries appear unremarkable. There is moderate stool throughout the colon. No evidence of bowel obstruction or  active inflammation. Normal appendix. Mild aortoiliac atherosclerotic disease. The abdominal aorta is otherwise unremarkable. There is no aneurysmal dilatation or evidence of dissection. The origins of the celiac axis, SMA, IMA as well as the origins of the renal arteries appear patent. There is a classic celiac axis branching anatomy. No portal venous gas identified. There is no adenopathy. There is diastases of anterior abdominal wall with a small fat containing umbilical hernia. The abdominal wall soft tissues are otherwise unremarkable. There is degenerative changes of the spine most prominent at L4-5 and L5-S1 where there is disc desiccation with vacuum phenomena. No acute osseous pathology. IMPRESSION: No evidence of aortic dissection or pulmonary embolism. Centrilobular emphysema with an area of ground-glass density in the right upper lobe, possibly combination of atelectasis/scarring versus pneumonia. Clinical correlation is recommended. Constipation. No evidence of bowel  obstruction or active inflammation. Normal appendix. Electronically Signed   By: Elgie Collard M.D.   On: 05/07/2016 05:34   Procedures Procedures (including critical care time)  Medications Ordered in ED Medications  sodium chloride 0.9 % bolus 1,000 mL (1,000 mLs Intravenous New Bag/Given 05/07/16 0546)  oxyCODONE (Oxy IR/ROXICODONE) immediate release tablet 10 mg (10 mg Oral Given 05/07/16 0445)  iopamidol (ISOVUE-370) 76 % injection (100 mLs  Contrast Given 05/07/16 0500)     Initial Impression / Assessment and Plan / ED Course  I have reviewed the triage vital signs and the nursing notes.  Pertinent labs & imaging results that were available during my care of the patient were reviewed by me and considered in my medical decision making (see chart for details).  Clinical Course  Value Comment By Time  Troponin i, poc: 0.00 Negative  Dierdre Forth, PA-C 07/26 0313  Creatinine: (!) 1.19 Elevated from previous  [facility], PA-C 07/26 0313  WBC: (!) 11.0 Mild leukocytosis Dierdre Forth, PA-C 07/26 0313  DG Chest 2 View No PNA, pneumothroax or pulmonary edema Dierdre Forth, PA-C 07/26 1610  EKG 12-Lead (Reviewed) Dierdre Forth, PA-C 07/26 0314   Caren Macadam presents with Left-sided chest pain and shortness of breath. CT scan without evidence of dissection. Initial troponin negative. Mild leukocytosis and elevation in creatinine.  She will need admission for chest pain rule out. She has had no previous testing. She does not have a cardiologist.  Of note CT scan shows Centrilobular emphysema with an area of ground-glass density in the right upper lobe, possibly combination of atelectasis/scarring versus pneumonia.  Pt to be admitted for CP r/o as she has risk factors and no previous provocative tests.    Final Clinical Impressions(s) / ED Diagnoses   Final diagnoses:  Chest pain, unspecified chest pain type    New Prescriptions New  Prescriptions   No medications on file     [facility], PA-C 05/07/16 [id number]    Tomasita Crumble, MD 05/07/16 1427

## 2016-05-07 NOTE — ED Notes (Signed)
Patient transported to CT 

## 2016-05-07 NOTE — H&P (Signed)
History and Physical    Tammy Schwartz WJX:[id number] DOB: 1956/04/04 DOA: 05/07/2016  PCP: Geraldo Pitter, MD Patient coming from: home  Chief Complaint: chest pain  HPI: Tammy Schwartz is a 60 y.o. female with medical history significant for tension, hyperlipidemia, tobacco use, chronic low back pain status post several surgeries presents to emergency Department chief complaint chest pain. Initial evaluation concerning for ACS.  Information is obtained from the patient. She states yesterday morning around 4 AM she was awakened with a sharp intermittent chest pain located left anterior radiating to her back. She reports this pain initially was intermittent the intensity worsened over the course of the day. She did manage to pain by "rubbing it" for most of the day came to the emergency department 9 PM. Associated symptoms include mild nausea no emesis decreased appetite. She denies diaphoresis shortness of breath lower extremity edema. She denies orthopnea abdominal pain headache dizziness syncope or near-syncope. She denies fever chills dysuria hematuria frequency or urgency. She denies any constipation diarrhea melena or bright red blood per rectum.  In addition she reports a fall down some stairs 2 weeks ago which has increased the intensity of her chronic low back pain. She denies numbness or tingling of that right leg. She denies any incontinence of bladder or bowel.   ED Course: In the emergency department her max temperature is 99.9. She is hemodynamically stable and not hypoxic. She received IV fluids and 10 mg of oxycodone.  Review of Systems: As per HPI otherwise 10 point review of systems negative.   Ambulatory Status:  Past Medical History:  Diagnosis Date  . Chronic back pain    HNp,spondylosis,radiculopathy  . COPD (chronic obstructive pulmonary disease) (HCC)   . Hyperlipidemia    was on choloesterol meds last yr-samples given in office but not needed since  .  Hypertension    takes Exforge daily  . Joint pain   . Pneumonia    hx of;many yrs ago  . Shortness of breath    takes Singulair daily;with exertion  . Sickle cell trait (HCC)   . Spondylolysis   . Tobacco use     Past Surgical History:  Procedure Laterality Date  . BACK SURGERY    . BREAST SURGERY Left    lumpectomy  . COLONOSCOPY    . DILATION AND CURETTAGE OF UTERUS    . LUMBAR LAMINECTOMY/DECOMPRESSION MICRODISCECTOMY  06/04/2012   Procedure: LUMBAR LAMINECTOMY/DECOMPRESSION MICRODISCECTOMY 1 LEVEL;  Surgeon: Carmela Hurt, MD;  Location: [facility];  Service: Neurosurgery;  Laterality: Left;  LEFT Lumbar five-sacral one diskectomy  . LUMBAR LAMINECTOMY/DECOMPRESSION MICRODISCECTOMY Right 06/16/2013   Procedure: RIGHT Lumbar Five-Sacral One Microdiskectomy;  Surgeon: Carmela Hurt, MD;  Location: [facility];  Service: Neurosurgery;  Laterality: Right;  RIGHT Lumbar Five-Sacral One Microdiskectomy  . LUMBAR LAMINECTOMY/DECOMPRESSION MICRODISCECTOMY Right 01/20/2014   Procedure: LUMBAR FOUR TO LUMBAR FIVE LUMBAR LAMINECTOMY/DECOMPRESSION MICRODISCECTOMY 1 LEVEL;  Surgeon: Carmela Hurt, MD;  Location: [facility];  Service: Neurosurgery;  Laterality: Right;  Right L45 laminectomy and foramintomy    Social History   Social History  . Marital status: Married    Spouse name: N/A  . Number of children: N/A  . Years of education: N/A   Occupational History  . Not on file.   Social History Main Topics  . Smoking status: Current Every Day Smoker    Packs/day: 0.25    Years: 20.00    Types: Cigarettes  . Smokeless tobacco:  Never Used  . Alcohol use No  . Drug use: No  . Sexual activity: Not Currently    Birth control/ protection: Post-menopausal   Other Topics Concern  . Not on file   Social History Narrative  . No narrative on file   She lives at home with her husband. She is a retired Engineer, structural. He ambulates with a cane with an unsteady gait No Known  Allergies  Family History  Problem Relation Age of Onset  . Cancer - Other Mother   . CAD Father   . Hypertension Sister     Prior to Admission medications   Medication Sig Start Date End Date Taking? Authorizing Provider  Amlodipine-Valsartan-HCTZ (EXFORGE HCT) 5-160-12.5 MG TABS Take 1 tablet by mouth daily.   Yes Historical Provider, MD  cyclobenzaprine (FLEXERIL) 10 MG tablet Take 1 tablet (10 mg total) by mouth 3 (three) times daily as needed for muscle spasms. 01/20/14  Yes Coletta Memos, MD  montelukast (SINGULAIR) 10 MG tablet Take 10 mg by mouth daily.    Yes Historical Provider, MD  Multiple Vitamin (MULTIVITAMIN WITH MINERALS) TABS Take 1 tablet by mouth daily.   Yes Historical Provider, MD  oxyCODONE-acetaminophen (PERCOCET/ROXICET) 5-325 MG per tablet Take 1-2 tablets by mouth every 6 (six) hours as needed for severe pain. 01/20/14  Yes Coletta Memos, MD  permethrin (ELIMITE) 5 % cream Apply 1 application topically daily.   Yes Historical Provider, MD  sulfamethoxazole-trimethoprim (BACTRIM DS,SEPTRA DS) 800-160 MG tablet Take 1 tablet by mouth 2 (two) times daily.   Yes Historical Provider, MD    Physical Exam: Vitals:   05/07/16 0600 05/07/16 0615 05/07/16 0645 05/07/16 0715  BP: 111/68 105/64 112/65 115/64  Pulse:  77 82   Resp: (!) 8 (!) 8 (!) 9 10  Temp:      TempSrc:      SpO2:  98% 100%      General:  Appears calm Slightly lethargic but not uncomfortable Eyes:  PERRL, EOMI, normal lids, iris ENT:  grossly normal hearing, lips & tongue, his membranes of her mouth are pink slightly dry Neck:  no LAD, masses or thyromegaly Cardiovascular:  RRR, no m/r/g. No LE edema. Pedal pulses are present and palpable. Chest tender to palpation Respiratory:  Respirations slightly shallow breath sounds somewhat diminished diffuse wheezing no crackles Abdomen:  soft, ntnd, positive bowel sounds somewhat sluggish no guarding or rebounding Skin:  no rash or induration seen on  limited exam Musculoskeletal:  grossly normal tone BUE/BLE, good ROM, no bony abnormality. Joints without swelling/erythema Psychiatric:  grossly normal mood and affect, speech fluent and appropriate, AOx3 Neurologic:  CN 2-12 grossly intact, moves all extremities in coordinated fashion, sensation intact lower extremity strength 5 out of 5 bilaterally moves all extremities spontaneously  Labs on Admission: I have personally reviewed following labs and imaging studies  CBC:  Recent Labs Lab 05/06/16 2148  WBC 11.0*  HGB 14.1  HCT 40.5  MCV 82.8  PLT 291   Basic Metabolic Panel:  Recent Labs Lab 05/06/16 2148  NA 136  K 4.0  CL 106  CO2 21*  GLUCOSE 105*  BUN 9  CREATININE 1.19*  CALCIUM 9.9   GFR: CrCl cannot be calculated (Unknown ideal weight.). Liver Function Tests: No results for input(s): AST, ALT, ALKPHOS, BILITOT, PROT, ALBUMIN in the last 168 hours. No results for input(s): LIPASE, AMYLASE in the last 168 hours. No results for input(s): AMMONIA in the last 168 hours. Coagulation Profile: No results  for input(s): INR, PROTIME in the last 168 hours. Cardiac Enzymes: No results for input(s): CKTOTAL, CKMB, CKMBINDEX, TROPONINI in the last 168 hours. BNP (last 3 results) No results for input(s): PROBNP in the last 8760 hours. HbA1C: No results for input(s): HGBA1C in the last 72 hours. CBG: No results for input(s): GLUCAP in the last 168 hours. Lipid Profile: No results for input(s): CHOL, HDL, LDLCALC, TRIG, CHOLHDL, LDLDIRECT in the last 72 hours. Thyroid Function Tests: No results for input(s): TSH, T4TOTAL, FREET4, T3FREE, THYROIDAB in the last 72 hours. Anemia Panel: No results for input(s): VITAMINB12, FOLATE, FERRITIN, TIBC, IRON, RETICCTPCT in the last 72 hours. Urine analysis: No results found for: COLORURINE, APPEARANCEUR, LABSPEC, PHURINE, GLUCOSEU, HGBUR, BILIRUBINUR, KETONESUR, PROTEINUR, UROBILINOGEN, NITRITE, LEUKOCYTESUR  Creatinine  Clearance: CrCl cannot be calculated (Unknown ideal weight.).  Sepsis Labs: @LABRCNTIP (procalcitonin:4,lacticidven:4) )No results found for this or any previous visit (from the past 240 hour(s)).   Radiological Exams on Admission: Dg Chest 2 View  Result Date: 05/06/2016 CLINICAL DATA:  Chest pain and shortness of breath for 1 day EXAM: CHEST  2 VIEW COMPARISON:  06/16/2013 FINDINGS: Cardiac shadow is within normal limits. The lungs are hyperinflated consistent with COPD. Date density is noted over the anterior aspect of the left second rib which appears to be related to the rib and is has been seen on prior exams. No focal infiltrate is seen. No other bony abnormality is noted. IMPRESSION: COPD without acute abnormality. Density over the anterior aspect of the left second rib which is felt to be related to the underlying rib end. Short-term followup may be helpful to assess for stability. Electronically Signed   By: Alcide Clever M.D.   On: 05/06/2016 22:17  Dg Lumbar Spine Complete  Result Date: 05/07/2016 CLINICAL DATA:  Initial evaluation for acute trauma, fall 10 days ago. Low back pain. EXAM: LUMBAR SPINE - COMPLETE 4+ VIEW COMPARISON:  Prior radiograph from 01/20/2014. FINDINGS: Dextroscoliosis. Vertebral bodies otherwise normally aligned with preservation of the normal lumbar lordosis. Vertebral body heights preserved. No acute fracture subluxation. Visualized sacrum intact. Degenerative spondylolysis present at L4-5 and L5-S1 with prominent reactive endplate sclerosis at L5-S1. Bilateral facet arthrosis present these levels as well. No acute soft tissue abnormality. Secrete did IV contrast material within the right urinary collecting system and bladder. IMPRESSION: 1. No radiographic evidence for acute traumatic injury within the lumbar spine. 2. Dextroscoliosis with moderate to advanced degenerative spondylolysis at L4-5 and L5-S1. Electronically Signed   By: Rise Mu M.D.   On:  05/07/2016 05:41  Ct Angio Chest/abd/pel For Dissection W And/or W/wo  Result Date: 05/07/2016 CLINICAL DATA:  60 year old female with chest pain radiating to the mid back. Shortness of breath. Concern for dissection. EXAM: CT ANGIO CHEST-ABD-PELV FOR DISSECTION W/ AND WO/W CM TECHNIQUE: Serial transaxial images from the thoracic inlet to the symphysis pubis was performed in 2 mm slice thickness. Coronal and sagittal reformatted images are provided. The study was performed following intravenous administration of contrast. CONTRAST:  100 cc Isovue 370 COMPARISON:  Chest radiograph dated 05/06/2016 FINDINGS: CTA chest: There is mild centrilobular emphysema. Bibasilar dependent atelectatic changes as well as linear scarring noted. A patchy area of interstitial prominence and ground-glass density in the right upper lobe may represent combination of atelectasis and chronic interstitial scarring. Superimposed pneumonia is not excluded. Clinical correlation is recommended. There is no pleural effusion or pneumothorax. The central airways are patent. There is mild atherosclerotic calcification of the thoracic aorta. There is no  evidence of dissection or aneurysm. The origins of the great vessels of the aortic arch appear patent. There is no CT evidence of pulmonary embolism. There is no cardiomegaly or pericardial effusion. Top-normal bilateral hilar lymph nodes. The esophagus is grossly unremarkable. No thyroid nodules identified. There is no axillary or supraclavicular adenopathy. The chest wall soft tissues appear unremarkable. The osseous structures are intact. CTA abdomen and pelvis: No intra-abdominal free air or free fluid. The liver, gallbladder, pancreas, spleen, adrenal glands, kidneys, visualized ureters, and urinary bladder appear unremarkable. The uterus is anteverted and grossly unremarkable. The ovaries appear unremarkable. There is moderate stool throughout the colon. No evidence of bowel obstruction or  active inflammation. Normal appendix. Mild aortoiliac atherosclerotic disease. The abdominal aorta is otherwise unremarkable. There is no aneurysmal dilatation or evidence of dissection. The origins of the celiac axis, SMA, IMA as well as the origins of the renal arteries appear patent. There is a classic celiac axis branching anatomy. No portal venous gas identified. There is no adenopathy. There is diastases of anterior abdominal wall with a small fat containing umbilical hernia. The abdominal wall soft tissues are otherwise unremarkable. There is degenerative changes of the spine most prominent at L4-5 and L5-S1 where there is disc desiccation with vacuum phenomena. No acute osseous pathology. IMPRESSION: No evidence of aortic dissection or pulmonary embolism. Centrilobular emphysema with an area of ground-glass density in the right upper lobe, possibly combination of atelectasis/scarring versus pneumonia. Clinical correlation is recommended. Constipation. No evidence of bowel obstruction or active inflammation. Normal appendix. Electronically Signed   By: Elgie Collard M.D.   On: 05/07/2016 05:34   EKG: Independently reviewed. This tach with occasional PVCs  Assessment/Plan Principal Problem:   Chest pain at rest Active Problems:   Lumbar spondylosis   Acute kidney injury (HCC)   Hypertension   Hyperlipidemia   Tobacco use   COPD (chronic obstructive pulmonary disease) (HCC)   #1. Chest pain at rest. Heart score 4. Risk factors include hypertension, hyperlipidemia, tobacco use. Initial troponin negative. EKG without acute changes. CT chest no evidence of aortic dissection or pulmonary embolism. Pain reproducible on exam. Recent fall downstairs. Doubt cardiac etiology -Admit to telemetry -Cycle troponin -Serial EKG -Lipid panel -Daily aspirin -Consider statin (hx hyperlipidemia but not on statin) -Analgesia -If he rules out consider outpatient stress test  #2. Acute kidney injury.  Likely related to decreased oral intake secondary to above. Mild. Creatinine 1.19 on admission. -Hold nephrotoxins -Gentle IV fluids -Monitor urine output -Recheck in the morning  #3. Lumbar spondylosis. Patient with long history of chronic low back pain. Status post several surgeries. Reports pain increased since her fall 2 weeks ago. Lumbar spine x-ray without acute abnormality -Pain management -Physical therapy  #3. Hypertension. Fair control in the emergency department. Home medications include amlodipine valsartan HCTZ -Hold HCTZ and ARB for now secondary to #2 -Monitor closely -We'll resume when indicated  #4. COPD. CT chest centrilobular emphysema with an area of groundglass density in the right upper lobe is a combination of atelectasis/scarring versus pneumonia. Patient current smoker. Breath sounds with wheezes on exam. Home medications include Singulair. She has a mild leukocytosis and max temp 99.9. Home medications include Bactrim she is unsure why she was on this medication. Doubt infectious process at this point -Incentive spirometry -Continue Singulair -nebulizer scheduled -Would likely benefit from outpatient pulmonary function tests  5. Hyperlipidemia. Home meds do not include a statin. -Lipid panel -Consider statin  6. Tobacco use. -Cessation counseling offered -  nicoderm    DVT prophylaxis: scd Code Status: full Family Communication: daughter at bedside  Disposition Plan: home  Consults called: none  Admission status: obs    Toya Smothers M MD [facility]  If 7PM-7AM, please contact night-coverage [email or web address] Password [email or web address]  05/07/2016, 7:50 AM

## 2016-05-07 NOTE — ED Notes (Signed)
Admitting at bedside 

## 2016-05-07 NOTE — ED Notes (Signed)
Pt family approached [facility] for estimated time; family was told that pt has room but awaiting to ensure room is clean and EMT will come get pt. Pt name called 5-10 minutes later with no answer; RN did not see family member in waiting room no longer; pt discharge out system due to no answer x 2 when called

## 2016-05-07 NOTE — Progress Notes (Signed)
Pt was hypotensive to start at [facility]- 92/53. B/P dropped to 74/56 with Lexiscan. Rx'd with IV NS bolus. Images pending.  Corine Shelter PA-C 05/07/2016 1:46 PM

## 2016-05-07 NOTE — Consult Note (Signed)
Cardiology Consult    Patient ID: SKII CLELAND MRN: [id number], DOB/AGE: 10-24-1955   Admit date: 05/07/2016 Date of Consult: 05/07/2016  Primary Physician: Geraldo Pitter, MD Primary Cardiologist: New Requesting Provider: Dr. Konrad Dolores Reason for Consultation: Chest pain  Patient Profile    60 yo female with PMH of chronic back pain, HTN, HLD, COPD, sickle cell trait, and tobacco use who presented to the [facility] ED with reports of chest pain.   Past Medical History   Past Medical History:  Diagnosis Date  . Chronic back pain    HNp,spondylosis,radiculopathy  . COPD (chronic obstructive pulmonary disease) (HCC)   . Hyperlipidemia    was on choloesterol meds last yr-samples given in office but not needed since  . Hypertension    takes Exforge daily  . Joint pain   . Pneumonia    hx of;many yrs ago  . Shortness of breath    takes Singulair daily;with exertion  . Sickle cell trait (HCC)   . Spondylolysis   . Tobacco use     Past Surgical History:  Procedure Laterality Date  . BACK SURGERY    . BREAST SURGERY Left    lumpectomy  . COLONOSCOPY    . DILATION AND CURETTAGE OF UTERUS    . LUMBAR LAMINECTOMY/DECOMPRESSION MICRODISCECTOMY  06/04/2012   Procedure: LUMBAR LAMINECTOMY/DECOMPRESSION MICRODISCECTOMY 1 LEVEL;  Surgeon: Carmela Hurt, MD;  Location: [facility];  Service: Neurosurgery;  Laterality: Left;  LEFT Lumbar five-sacral one diskectomy  . LUMBAR LAMINECTOMY/DECOMPRESSION MICRODISCECTOMY Right 06/16/2013   Procedure: RIGHT Lumbar Five-Sacral One Microdiskectomy;  Surgeon: Carmela Hurt, MD;  Location: [facility];  Service: Neurosurgery;  Laterality: Right;  RIGHT Lumbar Five-Sacral One Microdiskectomy  . LUMBAR LAMINECTOMY/DECOMPRESSION MICRODISCECTOMY Right 01/20/2014   Procedure: LUMBAR FOUR TO LUMBAR FIVE LUMBAR LAMINECTOMY/DECOMPRESSION MICRODISCECTOMY 1 LEVEL;  Surgeon: Carmela Hurt, MD;  Location: [facility];  Service: Neurosurgery;  Laterality:  Right;  Right L45 laminectomy and foramintomy     Allergies  No Known Allergies  History of Present Illness    Ms. Gelles is a 59 yo female with PMH of chronic back pain, HTN, HLD, COPD, sickle cell trait, and tobacco use. She denies ever having had a cardiac work up in the past. Does report family hx of cardiac problems including her father having an MI in his 12s, and sister with recent MI at age 52. Reports tobacco use for at least the past 30 years. Reports chronic back, and has undergone 4 different back surgeries per her report.   States about 2 weeks ago she was leaving church and fell down stairs. Reports falling on her right side and has been having pain to that side, and her back since then. Also reports intermittent episodes of left sided chest pain over the past week. Not associated with exertion. Reports most recent episode was yesterday morning when she was awoken from her sleep around 4am. She felt like it may have been musculoskeletal and attempted to "rub " the area. Did report chest pain with worse with deep inspiration. Proceeded to come to the ED when the pain did not subside later that evening. Denies any dyspnea, dizziness, lightheaded-ness, palpitations or lower extremity edema.   In the ED her labs showed normal electrolytes, Hgb 12, Cr 1.19>>0.96, Trop neg x2, chest and spine x-ray nonacute. Also had a CT chest which showed mild calcification in the thoracic aorta, negative for PE, but question atelectasis/scarring vs PNA. EKG showed SR without  acute ST/T wave abnormalities. Internal Medicine was called for admission and further management.   Inpatient Medications    . amLODipine  5 mg Oral Daily  . aspirin  325 mg Oral Daily  . atorvastatin  10 mg Oral q1800  . enoxaparin (LOVENOX) injection  40 mg Subcutaneous Q24H  . ipratropium-albuterol  3 mL Nebulization Q4H  . montelukast  10 mg Oral Daily  . nicotine  14 mg Transdermal Daily    Family History    Family  History  Problem Relation Age of Onset  . Cancer - Other Mother   . CAD Father   . Hypertension Sister     Social History    Social History   Social History  . Marital status: Married    Spouse name: N/A  . Number of children: N/A  . Years of education: N/A   Occupational History  . Not on file.   Social History Main Topics  . Smoking status: Current Every Day Smoker    Packs/day: 0.25    Years: 20.00    Types: Cigarettes  . Smokeless tobacco: Never Used  . Alcohol use No  . Drug use: No  . Sexual activity: Not Currently    Birth control/ protection: Post-menopausal   Other Topics Concern  . Not on file   Social History Narrative  . No narrative on file     Review of Systems    General:  No chills, fever, night sweats or weight changes.  Cardiovascular:  See HPI Dermatological: No rash, lesions/masses Respiratory: No cough, dyspnea Urologic: No hematuria, dysuria Abdominal:   No nausea, vomiting, diarrhea, bright red blood per rectum, melena, or hematemesis Neurologic:  No visual changes, wkns, changes in mental status. All other systems reviewed and are otherwise negative except as noted above.  Physical Exam    Blood pressure (!) 154/85, pulse 85, temperature 98.3 F (36.8 C), temperature source Oral, resp. rate 12, height 5\' 1"  (1.549 m), weight 131 lb 13.4 oz (59.8 kg), last menstrual period 03/13/2006, SpO2 94 %.  General: Well nourished AA female, NAD Psych: Normal affect. Neuro: Alert and oriented X 3. Moves all extremities spontaneously. HEENT: Normal  Neck: Supple without bruits or JVD. Lungs:  Resp regular and unlabored, Diminished in lower lobes. Heart: RRR no s3, s4, or murmurs. Abdomen: Soft, non-tender, non-distended, BS + x 4.  Extremities: No clubbing, cyanosis or edema. DP/PT/Radials 2+ and equal bilaterally.  Labs    Troponin [facility] of Care Test)  Recent Labs  05/06/16 2157  TROPIPOC 0.00    Recent Labs  05/07/16 0729    TROPONINI <0.03   Lab Results  Component Value Date   WBC 11.8 (H) 05/07/2016   HGB 12.1 05/07/2016   HCT 35.8 (L) 05/07/2016   MCV 83.4 05/07/2016   PLT 232 05/07/2016    Recent Labs Lab 05/06/16 2148 05/07/16 0729  NA 136  --   K 4.0  --   CL 106  --   CO2 21*  --   BUN 9  --   CREATININE 1.19* 0.96  CALCIUM 9.9  --   GLUCOSE 105*  --    Lab Results  Component Value Date   CHOL 187 05/07/2016   HDL 81 05/07/2016   LDLCALC 86 05/07/2016   TRIG 100 05/07/2016   No results found for: [facility]   Radiology Studies    Dg Chest 2 View  Result Date: 05/06/2016 CLINICAL DATA:  Chest pain and shortness of breath  for 1 day EXAM: CHEST  2 VIEW COMPARISON:  06/16/2013 FINDINGS: Cardiac shadow is within normal limits. The lungs are hyperinflated consistent with COPD. Date density is noted over the anterior aspect of the left second rib which appears to be related to the rib and is has been seen on prior exams. No focal infiltrate is seen. No other bony abnormality is noted. IMPRESSION: COPD without acute abnormality. Density over the anterior aspect of the left second rib which is felt to be related to the underlying rib end. Short-term followup may be helpful to assess for stability. Electronically Signed   By: Alcide Clever M.D.   On: 05/06/2016 22:17  Dg Lumbar Spine Complete  Result Date: 05/07/2016 CLINICAL DATA:  Initial evaluation for acute trauma, fall 10 days ago. Low back pain. EXAM: LUMBAR SPINE - COMPLETE 4+ VIEW COMPARISON:  Prior radiograph from 01/20/2014. FINDINGS: Dextroscoliosis. Vertebral bodies otherwise normally aligned with preservation of the normal lumbar lordosis. Vertebral body heights preserved. No acute fracture subluxation. Visualized sacrum intact. Degenerative spondylolysis present at L4-5 and L5-S1 with prominent reactive endplate sclerosis at L5-S1. Bilateral facet arthrosis present these levels as well. No acute soft tissue abnormality. Secrete did IV  contrast material within the right urinary collecting system and bladder. IMPRESSION: 1. No radiographic evidence for acute traumatic injury within the lumbar spine. 2. Dextroscoliosis with moderate to advanced degenerative spondylolysis at L4-5 and L5-S1. Electronically Signed   By: Rise Mu M.D.   On: 05/07/2016 05:41  Ct Angio Chest/abd/pel For Dissection W And/or W/wo  Result Date: 05/07/2016 CLINICAL DATA:  60 year old female with chest pain radiating to the mid back. Shortness of breath. Concern for dissection. EXAM: CT ANGIO CHEST-ABD-PELV FOR DISSECTION W/ AND WO/W CM TECHNIQUE: Serial transaxial images from the thoracic inlet to the symphysis pubis was performed in 2 mm slice thickness. Coronal and sagittal reformatted images are provided. The study was performed following intravenous administration of contrast. CONTRAST:  100 cc Isovue 370 COMPARISON:  Chest radiograph dated 05/06/2016 FINDINGS: CTA chest: There is mild centrilobular emphysema. Bibasilar dependent atelectatic changes as well as linear scarring noted. A patchy area of interstitial prominence and ground-glass density in the right upper lobe may represent combination of atelectasis and chronic interstitial scarring. Superimposed pneumonia is not excluded. Clinical correlation is recommended. There is no pleural effusion or pneumothorax. The central airways are patent. There is mild atherosclerotic calcification of the thoracic aorta. There is no evidence of dissection or aneurysm. The origins of the great vessels of the aortic arch appear patent. There is no CT evidence of pulmonary embolism. There is no cardiomegaly or pericardial effusion. Top-normal bilateral hilar lymph nodes. The esophagus is grossly unremarkable. No thyroid nodules identified. There is no axillary or supraclavicular adenopathy. The chest wall soft tissues appear unremarkable. The osseous structures are intact. CTA abdomen and pelvis: No intra-abdominal  free air or free fluid. The liver, gallbladder, pancreas, spleen, adrenal glands, kidneys, visualized ureters, and urinary bladder appear unremarkable. The uterus is anteverted and grossly unremarkable. The ovaries appear unremarkable. There is moderate stool throughout the colon. No evidence of bowel obstruction or active inflammation. Normal appendix. Mild aortoiliac atherosclerotic disease. The abdominal aorta is otherwise unremarkable. There is no aneurysmal dilatation or evidence of dissection. The origins of the celiac axis, SMA, IMA as well as the origins of the renal arteries appear patent. There is a classic celiac axis branching anatomy. No portal venous gas identified. There is no adenopathy. There is diastases of anterior abdominal  wall with a small fat containing umbilical hernia. The abdominal wall soft tissues are otherwise unremarkable. There is degenerative changes of the spine most prominent at L4-5 and L5-S1 where there is disc desiccation with vacuum phenomena. No acute osseous pathology. IMPRESSION: No evidence of aortic dissection or pulmonary embolism. Centrilobular emphysema with an area of ground-glass density in the right upper lobe, possibly combination of atelectasis/scarring versus pneumonia. Clinical correlation is recommended. Constipation. No evidence of bowel obstruction or active inflammation. Normal appendix. Electronically Signed   By: Elgie Collard M.D.   On: 05/07/2016 05:34   ECG & Cardiac Imaging    EKG: SR without acute ST/ T wave changes  Echo: None   Assessment & Plan    Ms. Alderfer is a 60 yo female with PMH of chronic back pain, HTN, HLD, COPD, sickle cell trait, and tobacco use. She denies ever having had a cardiac work up in the past. Does report family hx of cardiac problems including her father having an MI in his 5s, and sister with recent MI at age 61. Reports tobacco use for at least the past 30 years. Reports chronic back, and has undergone 4  different back surgeries per her report.  1. Chest pain: Reports intermittent episodes of left sided chest pain over the past week. Pain does not seem to be associated with exertion. Last episode was yesterday morning around 4am when she was awoken from sleep. No radiation of pain into her arms, or neck. Attempted to rub the area, thinking it was a muscle. Presented to the ED later when pain persisted. CTA negative for PE but questionable PNA. -- Trop neg x2 EKG SR with acute ST/T wave changes -- Does have risk factors including tobacco use, HTN, HLD and family hx. Will plan for stress test today -- 2D echo -- Lipid panel ok  2. HTN: Overall controlled, ACE/ARB held in the setting of AKI on admission.  3. HLD: On statin  4. Tobacco use: cessation advised  5. AKI: Improve this am    Janice Coffin, NP-C Pager [phone] 05/07/2016, 10:16 AM  I have seen and examined the patient along with Laverda Page, NP-C.  I have reviewed the chart, notes and new data.  I agree with NP's note.  Key new complaints: symptoms are atypical, but she has compelling risk factors for CAD Key examination changes: normal CV exam Key new findings / data: normal troponin, nonspecific lateral T wave changes, a little more pronounced compared to 2014.  PLAN: Lexiscan Myoview today. Smoking cessation reinforced.  Thurmon Fair, MD, [facility] [phone] 05/07/2016, 11:18 AM

## 2016-05-07 NOTE — ED Provider Notes (Signed)
Patient eloped from the emergency department before provider evaluation. I did not see or evaluate this patient. Triage note states left sided chest pain radiating to the mid back with SOB and productive cough.  Pt was never placed in the room for evaluation.      Dahlia Client Jameria Bradway, PA-C 05/07/16 0104

## 2016-05-07 NOTE — ED Notes (Signed)
Julian Reil informed of patients active CP; will advise of further instructions

## 2016-05-07 NOTE — Progress Notes (Signed)
PT Cancellation Note  Patient Details Name: Tammy Schwartz MRN: [id number] DOB: 08-Oct-1956   Cancelled Treatment:    Reason Eval/Treat Not Completed: Patient at procedure or test/unavailable.  Echo in room.  PT to check back later today or tomorrow as time allows.  Thanks,   Rollene Rotunda. Solenne Manwarren, PT, DPT [phone]   05/07/2016, 3:33 PM

## 2016-05-07 NOTE — Progress Notes (Signed)
*  PRELIMINARY RESULTS* Echocardiogram 2D Echocardiogram has been performed.  Jeryl Columbia 05/07/2016, 4:39 PM

## 2016-05-08 ENCOUNTER — Encounter (HOSPITAL_COMMUNITY): Payer: Self-pay | Admitting: General Practice

## 2016-05-08 DIAGNOSIS — I10 Essential (primary) hypertension: Secondary | ICD-10-CM | POA: Diagnosis not present

## 2016-05-08 DIAGNOSIS — R079 Chest pain, unspecified: Secondary | ICD-10-CM | POA: Diagnosis not present

## 2016-05-08 DIAGNOSIS — J449 Chronic obstructive pulmonary disease, unspecified: Secondary | ICD-10-CM | POA: Diagnosis not present

## 2016-05-08 DIAGNOSIS — N179 Acute kidney failure, unspecified: Secondary | ICD-10-CM | POA: Diagnosis not present

## 2016-05-08 DIAGNOSIS — J209 Acute bronchitis, unspecified: Secondary | ICD-10-CM

## 2016-05-08 LAB — CBC
HEMATOCRIT: 34.4 % — AB (ref 36.0–46.0)
HEMOGLOBIN: 11.9 g/dL — AB (ref 12.0–15.0)
MCH: 28.5 pg (ref 26.0–34.0)
MCHC: 34.6 g/dL (ref 30.0–36.0)
MCV: 82.5 fL (ref 78.0–100.0)
Platelets: 227 10*3/uL (ref 150–400)
RBC: 4.17 MIL/uL (ref 3.87–5.11)
RDW: 15.9 % — ABNORMAL HIGH (ref 11.5–15.5)
WBC: 9 10*3/uL (ref 4.0–10.5)

## 2016-05-08 LAB — BASIC METABOLIC PANEL
ANION GAP: 7 (ref 5–15)
BUN: 8 mg/dL (ref 6–20)
CALCIUM: 8.8 mg/dL — AB (ref 8.9–10.3)
CHLORIDE: 110 mmol/L (ref 101–111)
CO2: 21 mmol/L — AB (ref 22–32)
Creatinine, Ser: 0.75 mg/dL (ref 0.44–1.00)
GFR calc non Af Amer: >60 mL/min (ref >60)
GLUCOSE: 117 mg/dL — AB (ref 65–99)
POTASSIUM: 3.8 mmol/L (ref 3.5–5.1)
Sodium: 138 mmol/L (ref 135–145)

## 2016-05-08 MED ORDER — DOXYCYCLINE HYCLATE 100 MG PO TBEC: 100.0000 mg | DELAYED_RELEASE_TABLET | Freq: Two times a day (BID) | ORAL | 0 refills | Status: AC

## 2016-05-08 MED ORDER — MOMETASONE FURO-FORMOTEROL FUM 200-5 MCG/ACT IN AERO
2.0000 | INHALATION_SPRAY | Freq: Two times a day (BID) | RESPIRATORY_TRACT | Status: DC
  Filled 2016-05-08: qty 8.8

## 2016-05-08 MED ORDER — NICOTINE 14 MG/24HR TD PT24: 14.0000 mg | MEDICATED_PATCH | Freq: Every day | TRANSDERMAL | 0 refills | Status: DC

## 2016-05-08 MED ORDER — PANTOPRAZOLE SODIUM 40 MG PO TBEC: 40.0000 mg | DELAYED_RELEASE_TABLET | Freq: Every day | ORAL | Status: DC

## 2016-05-08 MED ORDER — IBUPROFEN 200 MG PO TABS
400.0000 mg | ORAL_TABLET | Freq: Three times a day (TID) | ORAL | Status: DC
  Administered 2016-05-08: 400 mg via ORAL
  Filled 2016-05-08: qty 2

## 2016-05-08 MED ORDER — LEVALBUTEROL HCL 0.63 MG/3ML IN NEBU
0.6300 mg | INHALATION_SOLUTION | Freq: Four times a day (QID) | RESPIRATORY_TRACT | Status: DC
  Administered 2016-05-08 (×2): 0.63 mg via RESPIRATORY_TRACT
  Filled 2016-05-08 (×2): qty 3

## 2016-05-08 MED ORDER — GUAIFENESIN ER 600 MG PO TB12
1200.0000 mg | ORAL_TABLET | Freq: Two times a day (BID) | ORAL | Status: DC
  Administered 2016-05-08: 1200 mg via ORAL
  Filled 2016-05-08 (×2): qty 2

## 2016-05-08 MED ORDER — GUAIFENESIN ER 600 MG PO TB12: 1200.0000 mg | ORAL_TABLET | Freq: Two times a day (BID) | ORAL | 0 refills | Status: AC

## 2016-05-08 MED ORDER — IPRATROPIUM-ALBUTEROL 0.5-2.5 (3) MG/3ML IN SOLN: 3.0000 mL | RESPIRATORY_TRACT | Status: DC

## 2016-05-08 MED ORDER — LEVALBUTEROL HCL 0.63 MG/3ML IN NEBU: 0.6300 mg | INHALATION_SOLUTION | RESPIRATORY_TRACT | Status: DC | PRN

## 2016-05-08 MED ORDER — AMLODIPINE-VALSARTAN-HCTZ 5-160-12.5 MG PO TABS: 1.0000 | ORAL_TABLET | Freq: Every day | ORAL | Status: DC

## 2016-05-08 MED ORDER — VENTOLIN HFA 108 (90 BASE) MCG/ACT IN AERS: 2.0000 | INHALATION_SPRAY | RESPIRATORY_TRACT | 3 refills | Status: DC | PRN

## 2016-05-08 MED ORDER — IPRATROPIUM BROMIDE 0.02 % IN SOLN
0.5000 mg | Freq: Four times a day (QID) | RESPIRATORY_TRACT | Status: DC
  Administered 2016-05-08 (×2): 0.5 mg via RESPIRATORY_TRACT
  Filled 2016-05-08 (×2): qty 2.5

## 2016-05-08 MED ORDER — HYDROXYZINE HCL 25 MG PO TABS: 25.0000 mg | ORAL_TABLET | Freq: Four times a day (QID) | ORAL | Status: DC | PRN

## 2016-05-08 MED ORDER — POLYETHYLENE GLYCOL 3350 17 G PO PACK
17.0000 g | PACK | Freq: Every day | ORAL | Status: DC
  Administered 2016-05-08: 17 g via ORAL
  Filled 2016-05-08: qty 1

## 2016-05-08 MED ORDER — HYDROXYZINE HCL 25 MG PO TABS: 25.0000 mg | ORAL_TABLET | Freq: Four times a day (QID) | ORAL | 0 refills | Status: DC | PRN

## 2016-05-08 MED ORDER — IPRATROPIUM BROMIDE 0.02 % IN SOLN: 0.5000 mg | RESPIRATORY_TRACT | Status: DC | PRN

## 2016-05-08 MED ORDER — METHYLPREDNISOLONE SODIUM SUCC 125 MG IJ SOLR
60.0000 mg | INTRAMUSCULAR | Status: AC
  Administered 2016-05-08: 60 mg via INTRAVENOUS
  Filled 2016-05-08: qty 2

## 2016-05-08 NOTE — Plan of Care (Signed)
Problem: Safety: Goal: Ability to remain free from injury will improve Outcome: Progressing Reviewed with patient the importance of not getting OOB without calling for help and she knows how to use the call light and/or call on the phone for the RN/NT, patient has been doing well with that. Patient has all personal belongings within her reach including her call light and phone, patient also aware of where to locate numbers on white board and how to find pertinent information regarding her care, will continue to monitor.

## 2016-05-08 NOTE — Evaluation (Signed)
Physical Therapy Evaluation Patient Details Name: Tammy Schwartz MRN: [id number] DOB: Aug 28, 1956 Today's Date: 05/08/2016   History of Present Illness  60 yo female admitted with chest pain. Hx of HTN, COPD, sickle cell trait, chronic LBP-multiple back sg  Clinical Impression  On eval, pt was Min guard assist for mobility. She walked ~75 fee with RW. O2 sats fluctuated, 85-90%, on RA during ambulation. Pt is unsteady and she is fearful of R LE buckling. Hx of R LE weakness and pain possibly due to back pain/multiple surgeries. Discussed d/c plan-recommend HHPT follow up-pt is agreeable. May need to consider RW use if R LE deficits remain an issue.     Follow Up Recommendations Home health PT;Supervision for mobility/OOB    Equipment Recommendations  None recommended by PT    Recommendations for Other Services       Precautions / Restrictions Precautions Precautions: Fall Restrictions Weight Bearing Restrictions: No      Mobility  Bed Mobility Overal bed mobility: Modified Independent                Transfers Overall transfer level: Needs assistance Equipment used: Straight cane Transfers: Sit to/from Stand Sit to Stand: Supervision         General transfer comment: for safety  Ambulation/Gait Ambulation/Gait assistance: Min guard Ambulation Distance (Feet): 75 Feet Assistive device: Straight cane Gait Pattern/deviations: Step-to pattern;Step-through pattern     General Gait Details: very close guard. Fluctuation O2 sats (85-90%) on RA during ambulation-unsure of accuracy of reading. slow gait speed.   Stairs            Wheelchair Mobility    Modified Rankin (Stroke Patients Only)       Balance Overall balance assessment: Needs assistance;History of Falls         Standing balance support: During functional activity Standing balance-Leahy Scale: Fair                               Pertinent Vitals/Pain Pain Assessment:  0-10 Pain Score: 8  Pain Location: head Pain Descriptors / Indicators: Aching Pain Intervention(s): Limited activity within patient's tolerance    Home Living Family/patient expects to be discharged to:: Private residence Living Arrangements: Spouse/significant other;Children   Type of Home: House Home Access: Stairs to enter Entrance Stairs-Rails: None Entrance Stairs-Number of Steps: 3-4 Home Layout: One level Home Equipment: Cane - single point;Crutches      Prior Function Level of Independence: Independent with assistive device(s);Needs assistance      ADL's / Homemaking Assistance Needed: husband/daughter helping with bathing  Comments: using cane for ambulation especially since fall     Hand Dominance        Extremity/Trunk Assessment   Upper Extremity Assessment: Generalized weakness           Lower Extremity Assessment: Generalized weakness      Cervical / Trunk Assessment: Normal  Communication   Communication: No difficulties  Cognition Arousal/Alertness: Awake/alert Behavior During Therapy: WFL for tasks assessed/performed Overall Cognitive Status: Within Functional Limits for tasks assessed                      General Comments      Exercises        Assessment/Plan    PT Assessment Patient needs continued PT services  PT Diagnosis Difficulty walking;Generalized weakness;Acute pain   PT Problem List Decreased strength;Decreased activity tolerance;Decreased balance;Decreased mobility;Pain  PT  Treatment Interventions DME instruction;Gait training;Functional mobility training;Balance training;Therapeutic exercise;Therapeutic activities;Patient/family education   PT Goals (Current goals can be found in the Care Plan section) Acute Rehab PT Goals Patient Stated Goal: improved function of R LE. No more falls.  PT Goal Formulation: With patient Time For Goal Achievement: 05/22/16 Potential to Achieve Goals: Good    Frequency Min  3X/week   Barriers to discharge        Co-evaluation               End of Session Equipment Utilized During Treatment: Gait belt Activity Tolerance: Patient limited by fatigue;Patient limited by pain Patient left: in bed;with call bell/phone within reach;with family/visitor present      Functional Assessment Tool Used: clinical judgement Functional Limitation: Mobility: Walking and moving around Mobility: Walking and Moving Around Current Status (Z6109): At least 1 percent but less than 20 percent impaired, limited or restricted Mobility: Walking and Moving Around Goal Status [phone]): At least 1 percent but less than 20 percent impaired, limited or restricted    Time: 0855-0910 PT Time Calculation (min) (ACUTE ONLY): 15 min   Charges:     PT Treatments $Gait Training: 8-22 mins   PT G Codes:   PT G-Codes **NOT FOR INPATIENT CLASS** Functional Assessment Tool Used: clinical judgement Functional Limitation: Mobility: Walking and moving around Mobility: Walking and Moving Around Current Status (U9811): At least 1 percent but less than 20 percent impaired, limited or restricted Mobility: Walking and Moving Around Goal Status [phone]): At least 1 percent but less than 20 percent impaired, limited or restricted    Rebeca Alert, MPT Pager: [phone]

## 2016-05-08 NOTE — Progress Notes (Signed)
Patient was medicated with 60 mg IV Solumedrol for itching, information given relating to this medication and will continue to monitor. Patient also wanted some Xanax since she is now a little more anxious, will continue to monitor and was able to sit with patient for a little bit and talk with her, placed a cool washcloth on her head, will continue to monitor.

## 2016-05-08 NOTE — Progress Notes (Signed)
Patient was given some Ambien for sleep and some Morphine for C/O headache pain rated 9/10 along front of forehead earlier and when I went to recheck her she was in tears because she was itching again so badly, text page placed because now she has some red bumps that look like hives on her upper chest and her eyelids seem to be a little swollen looking, no c/o not being able to swallow or breathe, awaiting a call back or orders and will continue to monitor.

## 2016-05-08 NOTE — Progress Notes (Signed)
Patient Name: Tammy Schwartz Date of Encounter: 05/08/2016  Principal Problem:   Chest pain at rest Active Problems:   Lumbar spondylosis   Acute kidney injury [facility])   Essential hypertension   Hyperlipidemia   Tobacco use   COPD (chronic obstructive pulmonary disease) (HCC)   Length of Stay: 0  SUBJECTIVE  Feels better. Seems to have complaints of bronchitis.  CURRENT MEDS . aspirin  325 mg Oral Daily  . atorvastatin  10 mg Oral q1800  . enoxaparin (LOVENOX) injection  40 mg Subcutaneous Q24H  . ipratropium  0.5 mg Nebulization Q6H  . levalbuterol  0.63 mg Nebulization Q6H  . montelukast  10 mg Oral Daily  . nicotine  14 mg Transdermal Daily  . polyethylene glycol  17 g Oral Daily    OBJECTIVE   Intake/Output Summary (Last 24 hours) at 05/08/16 1037 Last data filed at 05/07/16 2200  Gross per 24 hour  Intake             1890 ml  Output             1050 ml  Net              840 ml   Filed Weights   05/07/16 0811 05/08/16 0315  Weight: 59.8 kg (131 lb 13.4 oz) 61.3 kg (135 lb 3.2 oz)    PHYSICAL EXAM Vitals:   05/07/16 2021 05/08/16 0315 05/08/16 0831 05/08/16 0836  BP: 112/69 (!) 102/59    Pulse: 86 100    Resp: 14 19    Temp: 97.8 F (36.6 C) 98.1 F (36.7 C)    TempSrc: Oral Oral    SpO2: 97% 98% 100% 100%  Weight:  61.3 kg (135 lb 3.2 oz)    Height:       General: Alert, oriented x3, no distress Head: no evidence of trauma, PERRL, EOMI, no exophtalmos or lid lag, no myxedema, no xanthelasma; normal ears, nose and oropharynx Neck: normal jugular venous pulsations and no hepatojugular reflux; brisk carotid pulses without delay and no carotid bruits Chest: clear to auscultation, no signs of consolidation by percussion or palpation, normal fremitus, symmetrical and full respiratory excursions Cardiovascular: normal position and quality of the apical impulse, regular rhythm, normal first and second heart sounds, no rubs or gallops, no murmur Abdomen:  no tenderness or distention, no masses by palpation, no abnormal pulsatility or arterial bruits, normal bowel sounds, no hepatosplenomegaly Extremities: no clubbing, cyanosis or edema; 2+ radial, ulnar and brachial pulses bilaterally; 2+ right femoral, posterior tibial and dorsalis pedis pulses; 2+ left femoral, posterior tibial and dorsalis pedis pulses; no subclavian or femoral bruits Neurological: grossly nonfocal  LABS  CBC  Recent Labs  05/07/16 0729 05/08/16 0306  WBC 11.8* 9.0  HGB 12.1 11.9*  HCT 35.8* 34.4*  MCV 83.4 82.5  PLT 232 227   Basic Metabolic Panel  Recent Labs  05/06/16 2148 05/07/16 0729 05/08/16 0306  NA 136  --  138  K 4.0  --  3.8  CL 106  --  110  CO2 21*  --  21*  GLUCOSE 105*  --  117*  BUN 9  --  8  CREATININE 1.19* 0.96 0.75  CALCIUM 9.9  --  8.8*   Liver Function Tests No results for input(s): AST, ALT, ALKPHOS, BILITOT, PROT, ALBUMIN in the last 72 hours. No results for input(s): LIPASE, AMYLASE in the last 72 hours. Cardiac Enzymes  Recent Labs  05/07/16 0729 05/07/16 1458  05/07/16 1859  TROPONINI <0.03 <0.03 <0.03   BNP Invalid input(s): POCBNP D-Dimer No results for input(s): DDIMER in the last 72 hours. Hemoglobin A1C No results for input(s): HGBA1C in the last 72 hours. Fasting Lipid Panel  Recent Labs  05/07/16 0739  CHOL 187  HDL 81  LDLCALC 86  TRIG 100  CHOLHDL 2.3   Thyroid Function Tests No results for input(s): TSH, T4TOTAL, T3FREE, THYROIDAB in the last 72 hours.  Invalid input(s): FREET3  Radiology Studies Imaging results have been reviewed and Dg Chest 2 View  Result Date: 05/06/2016 CLINICAL DATA:  Chest pain and shortness of breath for 1 day EXAM: CHEST  2 VIEW COMPARISON:  06/16/2013 FINDINGS: Cardiac shadow is within normal limits. The lungs are hyperinflated consistent with COPD. Date density is noted over the anterior aspect of the left second rib which appears to be related to the rib and is  has been seen on prior exams. No focal infiltrate is seen. No other bony abnormality is noted. IMPRESSION: COPD without acute abnormality. Density over the anterior aspect of the left second rib which is felt to be related to the underlying rib end. Short-term followup may be helpful to assess for stability. Electronically Signed   By: Alcide Clever M.D.   On: 05/06/2016 22:17  Dg Lumbar Spine Complete  Result Date: 05/07/2016 CLINICAL DATA:  Initial evaluation for acute trauma, fall 10 days ago. Low back pain. EXAM: LUMBAR SPINE - COMPLETE 4+ VIEW COMPARISON:  Prior radiograph from 01/20/2014. FINDINGS: Dextroscoliosis. Vertebral bodies otherwise normally aligned with preservation of the normal lumbar lordosis. Vertebral body heights preserved. No acute fracture subluxation. Visualized sacrum intact. Degenerative spondylolysis present at L4-5 and L5-S1 with prominent reactive endplate sclerosis at L5-S1. Bilateral facet arthrosis present these levels as well. No acute soft tissue abnormality. Secrete did IV contrast material within the right urinary collecting system and bladder. IMPRESSION: 1. No radiographic evidence for acute traumatic injury within the lumbar spine. 2. Dextroscoliosis with moderate to advanced degenerative spondylolysis at L4-5 and L5-S1. Electronically Signed   By: Rise Mu M.D.   On: 05/07/2016 05:41  Nm Myocar Multi W/spect W/wall Motion / Ef  Result Date: 05/07/2016 CLINICAL DATA:  60 year old with chest pain. EXAM: MYOCARDIAL IMAGING WITH SPECT (REST AND PHARMACOLOGIC-STRESS) GATED LEFT VENTRICULAR WALL MOTION STUDY LEFT VENTRICULAR EJECTION FRACTION TECHNIQUE: Standard myocardial SPECT imaging was performed after resting intravenous injection of 10 mCi Tc-9m tetrofosmin. Subsequently, intravenous infusion of Lexiscan was performed under the supervision of the Cardiology staff. At peak effect of the drug, 30 mCi Tc-13m tetrofosmin was injected intravenously and standard  myocardial SPECT imaging was performed. Quantitative gated imaging was also performed to evaluate left ventricular wall motion, and estimate left ventricular ejection fraction. COMPARISON:  None. FINDINGS: Perfusion: Appearance and orientation of the left ventricle is slightly atypical but there is no clear evidence for reversibility or infarct. Wall Motion: Normal left ventricular wall motion. No left ventricular dilation. Left Ventricular Ejection Fraction: 81 % End diastolic volume 39 ml End systolic volume 8 ml IMPRESSION: 1. No reversible ischemia or infarction. 2. Normal left ventricular wall motion. 3. Left ventricular ejection fraction is 81%. 4. Non invasive risk stratification*: Low *2012 Appropriate Use Criteria for Coronary Revascularization Focused Update: J Am Coll Cardiol. 2012;59(9):857-881. http://content.[email or web address].aspx?articleid=[email or web address] Electronically Signed   By: Richarda Overlie M.D.   On: 05/07/2016 16:46  Ct Angio Chest/abd/pel For Dissection W And/or W/wo  Result Date: 05/07/2016 CLINICAL DATA:  60 year old female with chest pain radiating  to the mid back. Shortness of breath. Concern for dissection. EXAM: CT ANGIO CHEST-ABD-PELV FOR DISSECTION W/ AND WO/W CM TECHNIQUE: Serial transaxial images from the thoracic inlet to the symphysis pubis was performed in 2 mm slice thickness. Coronal and sagittal reformatted images are provided. The study was performed following intravenous administration of contrast. CONTRAST:  100 cc Isovue 370 COMPARISON:  Chest radiograph dated 05/06/2016 FINDINGS: CTA chest: There is mild centrilobular emphysema. Bibasilar dependent atelectatic changes as well as linear scarring noted. A patchy area of interstitial prominence and ground-glass density in the right upper lobe may represent combination of atelectasis and chronic interstitial scarring. Superimposed pneumonia is not excluded. Clinical correlation is recommended. There is no pleural effusion or  pneumothorax. The central airways are patent. There is mild atherosclerotic calcification of the thoracic aorta. There is no evidence of dissection or aneurysm. The origins of the great vessels of the aortic arch appear patent. There is no CT evidence of pulmonary embolism. There is no cardiomegaly or pericardial effusion. Top-normal bilateral hilar lymph nodes. The esophagus is grossly unremarkable. No thyroid nodules identified. There is no axillary or supraclavicular adenopathy. The chest wall soft tissues appear unremarkable. The osseous structures are intact. CTA abdomen and pelvis: No intra-abdominal free air or free fluid. The liver, gallbladder, pancreas, spleen, adrenal glands, kidneys, visualized ureters, and urinary bladder appear unremarkable. The uterus is anteverted and grossly unremarkable. The ovaries appear unremarkable. There is moderate stool throughout the colon. No evidence of bowel obstruction or active inflammation. Normal appendix. Mild aortoiliac atherosclerotic disease. The abdominal aorta is otherwise unremarkable. There is no aneurysmal dilatation or evidence of dissection. The origins of the celiac axis, SMA, IMA as well as the origins of the renal arteries appear patent. There is a classic celiac axis branching anatomy. No portal venous gas identified. There is no adenopathy. There is diastases of anterior abdominal wall with a small fat containing umbilical hernia. The abdominal wall soft tissues are otherwise unremarkable. There is degenerative changes of the spine most prominent at L4-5 and L5-S1 where there is disc desiccation with vacuum phenomena. No acute osseous pathology. IMPRESSION: No evidence of aortic dissection or pulmonary embolism. Centrilobular emphysema with an area of ground-glass density in the right upper lobe, possibly combination of atelectasis/scarring versus pneumonia. Clinical correlation is recommended. Constipation. No evidence of bowel obstruction or active  inflammation. Normal appendix. Electronically Signed   By: Elgie Collard M.D.   On: 05/07/2016 05:34   TELE NSR   ASSESSMENT AND PLAN  Chest discomfort better. Current complaints most consistent with acute (on chronic) bronchitis. Emphysema changes on CT chest. Smoking cessation recommended and discussed at length. Lipids are low risk, excellent HDL noted. BP is low today, off meds. Reevaluate as outpt.   Thurmon Fair, MD, [facility] [phone] office [phone] pager 05/08/2016 10:37 AM

## 2016-05-08 NOTE — Progress Notes (Signed)
Patient discharge paperwork gone over with patient and family. All questions answered to patient satisfaction. Medication education was gone over thoroughly. Patient expressed understanding of medication schedule. Telemetry discontinued, iv removed intact. Patient discharged to home with family by way of wheelchair.

## 2016-05-08 NOTE — Progress Notes (Signed)
Patient and her son sleeping at this time. I had to place some O2 via N/C on her for sats into the upper 80's to low 90's, will continue to monitor. Patient looks free from s/s of distress/discomfort at this time.

## 2016-05-08 NOTE — Discharge Summary (Signed)
Physician Discharge Summary  Tammy Schwartz OZD:[id number] DOB: 1956/01/15 DOA: 05/07/2016  PCP: Tammy Pitter, MD  Admit date: 05/07/2016 Discharge date: 05/08/2016  Time spent: 65 minutes  Recommendations for Outpatient Follow-up:  1. Follow-up with Tammy Pitter, MD in 1 week. On follow-up patient blood pressure need to be reassessed as patient was told to hold antihypertensive medications until she follows up with PCP in 1 week will unless systolic blood pressure greater than 140. Patient's blood pressure was soft during the hospitalization. Patient will need a basic metabolic profile done to follow-up on electrolytes and renal function. 2.    Discharge Diagnoses:  Principal Problem:   Chest pain at rest Active Problems:   Lumbar spondylosis   Acute kidney injury [facility])   Essential hypertension   Hyperlipidemia   Tobacco use   COPD (chronic obstructive pulmonary disease) (HCC)   Discharge Condition: Stable and improved  Diet recommendation: Regular  Filed Weights   05/07/16 0811 05/08/16 0315  Weight: 59.8 kg (131 lb 13.4 oz) 61.3 kg (135 lb 3.2 oz)    History of present illness:  Per Dr Tammy Schwartz is a 60 y.o. female with medical history significant for hypertension, hyperlipidemia, tobacco use, chronic low back pain status post several surgeries presented to emergency Department chief complaint chest pain. Initial evaluation concerning for ACS.  Information was obtained from the patient. She stated the morning prior to admission, around 4 AM she was awakened with a sharp intermittent chest pain located left anterior radiating to her back. She reported this pain initially was intermittent the intensity worsened over the course of the day. She did manage the pain by "rubbing it" for most of the day came to the emergency department 9 PM. Associated symptoms included mild nausea no emesis decreased appetite. She denied diaphoresis shortness of breath lower  extremity edema. She denied orthopnea abdominal pain headache dizziness syncope or near-syncope. She denied fever chills dysuria hematuria frequency or urgency. She denied any constipation diarrhea melena or bright red blood per rectum.  In addition she reported a fall down some stairs 2 weeks ago which has increased the intensity of her chronic low back pain. She denied numbness or tingling of that right leg. She denied any incontinence of bladder or bowel.   ED Course: In the emergency department her max temperature is 99.9. She is hemodynamically stable and not hypoxic. She received IV fluids and 10 mg of oxycodone.  Hospital Course:  #1 chest pain/acute bronchitis Patient was admitted with chest pain with a heart score of 4 with risk factors significant for hypertension, hyperlipidemia, tobacco abuse. Cardiac enzymes which was cycled were negative 3. Fasting lipid panel obtained had a total cholesterol 187 LDL of 86. CT angiogram of the chest was done which was negative for an acute PE, or aortic dissection. Patient was noted on admission to have some wheezing given a dose of IV Solu-Medrol as well as placed on breathing treatments. Cardiology consultation was obtained and patient was seen in consultation by Tammy Schwartz who recommended a Myoview stress study. Patient underwent a 2-D echo with EF of 65-70% with no wall motion abnormalities, grade 2 diastolic dysfunction. Patient underwent Myoview stress study on 05/07/2016 which showed no reversible ischemia or infarction. EF of 81%. Normal left ventricular wall motion. Patient's blood pressure also remained on the sulci June the hospitalization. Patient improved clinically did not have any further chest pain and patient be discharged home in stable and improved condition. Patient will be discharged  on 5 days of oral doxycycline as well as Mucinex for presumed/probable acute bronchitis. Outpatient follow-up.  #2 hypertension Patient noted on  admission to have soft borderline blood pressure and a such patient's antihypertensive medications were held. Patient's blood pressure improved with hydration during the hospitalization. Patient will be discharged home off antihypertensives and is to resume it on follow-up with PCP or in 1 week if systolic blood pressure greater than 140. Outpatient follow-up.  #3 acute kidney injury Patient noted to have acute kidney injury on admission with a creatinine of 1.19. Felt to be likely secondary to prerenal azotemia in the setting of ARB and diuretics. Patient's antihypertensive medications were held. Patient was hydrated with IV fluids with resolution of acute kidney injury by day of discharge. Patient's creatinine on day of discharge was 0.75.  #4 COPD/acute bronchitis Patient with ongoing tobacco abuse. Patient noted to have wheezes on examination and was maintained on home regimen of singular. Patient was given a dose of IV Solu-Medrol and placed on Xopenex and Atrovent nebulizers. Patient did not have any further wheezing. Patient will be discharged home on a course of doxycycline for possible acute bronchitis in addition to Mucinex and breo ellipta. Patient is to use her albuterol inhaler 3 times daily for the next 4-5 days and then as needed. Outpatient follow-up.  #5 hyperlipidemia Patient noted to have a history of hyperlipidemia and initially started on nystatin during this hospitalization. Fasting lipid panel was obtained and patient noted to have LDL of 86. Outpatient follow-up.  #6 tobacco abuse Tobacco cessation. Patient was placed on a nicotine patch.  Procedures:  Myoview stress test 05/07/2016--- no reversible ischemia or infarction. Normal left ventricle wall motion. Left ventricle ejection fraction 81%. Noninvasive risk sratification is low.  CT angiogram chest abdomen and pelvis 05/07/2016  Chest x-ray 05/06/2016  X-rays of the L-spine 05/07/2016  2-D echo  05/07/2016  Consultations:  Cardiology: Tammy Schwartz 05/07/2016    Discharge Exam: Vitals:   05/08/16 0315 05/08/16 1057  BP: (!) 102/59 112/77  Pulse: 100 90  Resp: 19 17  Temp: 98.1 F (36.7 C)     General: NAD Cardiovascular: RRR Respiratory: CTAB  Discharge Instructions   Discharge Instructions    Diet general    Complete by:  As directed   Discharge instructions    Complete by:  As directed   Use albuterol inhaler 3 times daily x 4 days, then as needed.  Follow-up with Tammy Pitter, MD in 1 week. Stop smoking.   Increase activity slowly    Complete by:  As directed     Current Discharge Medication List    START taking these medications   Details  doxycycline (DORYX) 100 MG EC tablet Take 1 tablet (100 mg total) by mouth 2 (two) times daily. Qty: 10 tablet, Refills: 0    guaiFENesin (MUCINEX) 600 MG 12 hr tablet Take 2 tablets (1,200 mg total) by mouth 2 (two) times daily. Qty: 20 tablet, Refills: 0    hydrOXYzine (ATARAX/VISTARIL) 25 MG tablet Take 1 tablet (25 mg total) by mouth every 6 (six) hours as needed for anxiety or itching. Qty: 20 tablet, Refills: 0    nicotine (NICODERM CQ - DOSED IN MG/24 HOURS) 14 mg/24hr patch Place 1 patch (14 mg total) onto the skin daily. Qty: 28 patch, Refills: 0      CONTINUE these medications which have CHANGED   Details  Amlodipine-Valsartan-HCTZ (EXFORGE HCT) 5-160-12.5 MG TABS Take 1 tablet by mouth daily. Resume in 1  week after you have seen PCP or if SBP > 140 Qty: 30 tablet    VENTOLIN HFA 108 (90 Base) MCG/ACT inhaler Inhale 2 puffs into the lungs every 4 (four) hours as needed. Use 3 times daily x 4 days, then every 4 hours as needed. Qty: 1 Inhaler, Refills: 3      CONTINUE these medications which have NOT CHANGED   Details  cyclobenzaprine (FLEXERIL) 10 MG tablet Take 1 tablet (10 mg total) by mouth 3 (three) times daily as needed for muscle spasms. Qty: 45 tablet, Refills: 0    fluticasone  furoate-vilanterol (BREO ELLIPTA) 100-25 MCG/INH AEPB Inhale 1 puff into the lungs daily.    montelukast (SINGULAIR) 10 MG tablet Take 10 mg by mouth daily.     Multiple Vitamin (MULTIVITAMIN WITH MINERALS) TABS Take 1 tablet by mouth daily.    oxyCODONE-acetaminophen (PERCOCET/ROXICET) 5-325 MG per tablet Take 1-2 tablets by mouth every 6 (six) hours as needed for severe pain. Qty: 80 tablet, Refills: 0    permethrin (ELIMITE) 5 % cream Apply 1 application topically daily.      STOP taking these medications     sulfamethoxazole-trimethoprim (BACTRIM DS,SEPTRA DS) 800-160 MG tablet        No Known Allergies Follow-up Information    BLAND,VEITA J, MD. Schedule an appointment as soon as possible for a visit in 1 week(s).   Specialty:  Family Medicine Contact information: [address] [place] [address] [phone]            The results of significant diagnostics from this hospitalization (including imaging, microbiology, ancillary and laboratory) are listed below for reference.    Significant Diagnostic Studies: Dg Chest 2 View  Result Date: 05/06/2016 CLINICAL DATA:  Chest pain and shortness of breath for 1 day EXAM: CHEST  2 VIEW COMPARISON:  06/16/2013 FINDINGS: Cardiac shadow is within normal limits. The lungs are hyperinflated consistent with COPD. Date density is noted over the anterior aspect of the left second rib which appears to be related to the rib and is has been seen on prior exams. No focal infiltrate is seen. No other bony abnormality is noted. IMPRESSION: COPD without acute abnormality. Density over the anterior aspect of the left second rib which is felt to be related to the underlying rib end. Short-term followup may be helpful to assess for stability. Electronically Signed   By: Alcide Clever M.D.   On: 05/06/2016 22:17  Dg Lumbar Spine Complete  Result Date: 05/07/2016 CLINICAL DATA:  Initial evaluation for acute trauma, fall 10 days ago. Low  back pain. EXAM: LUMBAR SPINE - COMPLETE 4+ VIEW COMPARISON:  Prior radiograph from 01/20/2014. FINDINGS: Dextroscoliosis. Vertebral bodies otherwise normally aligned with preservation of the normal lumbar lordosis. Vertebral body heights preserved. No acute fracture subluxation. Visualized sacrum intact. Degenerative spondylolysis present at L4-5 and L5-S1 with prominent reactive endplate sclerosis at L5-S1. Bilateral facet arthrosis present these levels as well. No acute soft tissue abnormality. Secrete did IV contrast material within the right urinary collecting system and bladder. IMPRESSION: 1. No radiographic evidence for acute traumatic injury within the lumbar spine. 2. Dextroscoliosis with moderate to advanced degenerative spondylolysis at L4-5 and L5-S1. Electronically Signed   By: Rise Mu M.D.   On: 05/07/2016 05:41  Nm Myocar Multi W/spect W/wall Motion / Ef  Result Date: 05/07/2016 CLINICAL DATA:  60 year old with chest pain. EXAM: MYOCARDIAL IMAGING WITH SPECT (REST AND PHARMACOLOGIC-STRESS) GATED LEFT VENTRICULAR WALL MOTION STUDY LEFT VENTRICULAR  EJECTION FRACTION TECHNIQUE: Standard myocardial SPECT imaging was performed after resting intravenous injection of 10 mCi Tc-30m tetrofosmin. Subsequently, intravenous infusion of Lexiscan was performed under the supervision of the Cardiology staff. At peak effect of the drug, 30 mCi Tc-41m tetrofosmin was injected intravenously and standard myocardial SPECT imaging was performed. Quantitative gated imaging was also performed to evaluate left ventricular wall motion, and estimate left ventricular ejection fraction. COMPARISON:  None. FINDINGS: Perfusion: Appearance and orientation of the left ventricle is slightly atypical but there is no clear evidence for reversibility or infarct. Wall Motion: Normal left ventricular wall motion. No left ventricular dilation. Left Ventricular Ejection Fraction: 81 % End diastolic volume 39 ml End systolic  volume 8 ml IMPRESSION: 1. No reversible ischemia or infarction. 2. Normal left ventricular wall motion. 3. Left ventricular ejection fraction is 81%. 4. Non invasive risk stratification*: Low *2012 Appropriate Use Criteria for Coronary Revascularization Focused Update: J Am Coll Cardiol. 2012;59(9):857-881. http://content.dementiazones.com.aspx?articleid=[email or web address] Electronically Signed   By: Richarda Overlie M.D.   On: 05/07/2016 16:46  Ct Angio Chest/abd/pel For Dissection W And/or W/wo  Result Date: 05/07/2016 CLINICAL DATA:  60 year old female with chest pain radiating to the mid back. Shortness of breath. Concern for dissection. EXAM: CT ANGIO CHEST-ABD-PELV FOR DISSECTION W/ AND WO/W CM TECHNIQUE: Serial transaxial images from the thoracic inlet to the symphysis pubis was performed in 2 mm slice thickness. Coronal and sagittal reformatted images are provided. The study was performed following intravenous administration of contrast. CONTRAST:  100 cc Isovue 370 COMPARISON:  Chest radiograph dated 05/06/2016 FINDINGS: CTA chest: There is mild centrilobular emphysema. Bibasilar dependent atelectatic changes as well as linear scarring noted. A patchy area of interstitial prominence and ground-glass density in the right upper lobe may represent combination of atelectasis and chronic interstitial scarring. Superimposed pneumonia is not excluded. Clinical correlation is recommended. There is no pleural effusion or pneumothorax. The central airways are patent. There is mild atherosclerotic calcification of the thoracic aorta. There is no evidence of dissection or aneurysm. The origins of the great vessels of the aortic arch appear patent. There is no CT evidence of pulmonary embolism. There is no cardiomegaly or pericardial effusion. Top-normal bilateral hilar lymph nodes. The esophagus is grossly unremarkable. No thyroid nodules identified. There is no axillary or supraclavicular adenopathy. The chest wall soft  tissues appear unremarkable. The osseous structures are intact. CTA abdomen and pelvis: No intra-abdominal free air or free fluid. The liver, gallbladder, pancreas, spleen, adrenal glands, kidneys, visualized ureters, and urinary bladder appear unremarkable. The uterus is anteverted and grossly unremarkable. The ovaries appear unremarkable. There is moderate stool throughout the colon. No evidence of bowel obstruction or active inflammation. Normal appendix. Mild aortoiliac atherosclerotic disease. The abdominal aorta is otherwise unremarkable. There is no aneurysmal dilatation or evidence of dissection. The origins of the celiac axis, SMA, IMA as well as the origins of the renal arteries appear patent. There is a classic celiac axis branching anatomy. No portal venous gas identified. There is no adenopathy. There is diastases of anterior abdominal wall with a small fat containing umbilical hernia. The abdominal wall soft tissues are otherwise unremarkable. There is degenerative changes of the spine most prominent at L4-5 and L5-S1 where there is disc desiccation with vacuum phenomena. No acute osseous pathology. IMPRESSION: No evidence of aortic dissection or pulmonary embolism. Centrilobular emphysema with an area of ground-glass density in the right upper lobe, possibly combination of atelectasis/scarring versus pneumonia. Clinical correlation is recommended. Constipation. No evidence of bowel  obstruction or active inflammation. Normal appendix. Electronically Signed   By: Elgie Collard M.D.   On: 05/07/2016 05:34   Microbiology: No results found for this or any previous visit (from the past 240 hour(s)).   Labs: Basic Metabolic Panel:  Recent Labs Lab 05/06/16 2148 05/07/16 0729 05/08/16 0306  NA 136  --  138  K 4.0  --  3.8  CL 106  --  110  CO2 21*  --  21*  GLUCOSE 105*  --  117*  BUN 9  --  8  CREATININE 1.19* 0.96 0.75  CALCIUM 9.9  --  8.8*   Liver Function Tests: No results for  input(s): AST, ALT, ALKPHOS, BILITOT, PROT, ALBUMIN in the last 168 hours. No results for input(s): LIPASE, AMYLASE in the last 168 hours. No results for input(s): AMMONIA in the last 168 hours. CBC:  Recent Labs Lab 05/06/16 2148 05/07/16 0729 05/08/16 0306  WBC 11.0* 11.8* 9.0  HGB 14.1 12.1 11.9*  HCT 40.5 35.8* 34.4*  MCV 82.8 83.4 82.5  PLT 291 232 227   Cardiac Enzymes:  Recent Labs Lab 05/07/16 0729 05/07/16 1458 05/07/16 1859  TROPONINI <0.03 <0.03 <0.03   BNP: BNP (last 3 results) No results for input(s): BNP in the last 8760 hours.  ProBNP (last 3 results) No results for input(s): PROBNP in the last 8760 hours.  CBG: No results for input(s): GLUCAP in the last 168 hours.     SignedRamiro Harvest MD.  [facility] 05/08/2016, 5:05 PM

## 2016-05-08 NOTE — Plan of Care (Signed)
Problem: Education: Goal: Knowledge of [name] Education information/materials will improve Outcome: Progressing Patient received the Welcome packet and reviewed some general information regarding rules of the unit and POC for now and how it can change depending upon her disease process, will continue to monitor.

## 2016-05-08 NOTE — Progress Notes (Signed)
Patient states she feels much better since she was able to sleep. Patient's bumps on her chest have gone down and she says that the itching has stopped and she denies pain at this time, will continue to monitor.

## 2016-05-08 NOTE — Plan of Care (Signed)
Problem: Pain Managment: Goal: General experience of comfort will improve Outcome: Progressing Patient calls out for pain medication appropriately and is able to use the pain scale and tell rate,quality, duration and radiation, will continue to monitorand follow with pain meds and appropriate additional treatments as needed.

## 2016-05-08 NOTE — Progress Notes (Signed)
Report received via Melissa RN in patient's room using SBAR format, reviewed orders, labs, VS, meds and patient's general condition. Patient is c/o a headache and she will give her some Tylenol. Patient is c/o itching to her neck, ears and upper back and text page placed to [facility] to see if they would like to order anything, will assume care of patient.

## 2016-08-14 ENCOUNTER — Other Ambulatory Visit: Payer: Self-pay | Admitting: Family Medicine

## 2016-08-14 DIAGNOSIS — Z1231 Encounter for screening mammogram for malignant neoplasm of breast: Secondary | ICD-10-CM

## 2016-10-02 ENCOUNTER — Ambulatory Visit: Payer: Managed Care, Other (non HMO)

## 2016-10-28 ENCOUNTER — Ambulatory Visit
Admission: RE | Admit: 2016-10-28 | Discharge: 2016-10-28 | Disposition: A | Discharge status: Home / Self Care | Payer: No Typology Code available for payment source | Source: Ambulatory Visit | Attending: Family Medicine | Admitting: Family Medicine

## 2016-10-28 DIAGNOSIS — Z1231 Encounter for screening mammogram for malignant neoplasm of breast: Secondary | ICD-10-CM

## 2016-11-11 ENCOUNTER — Ambulatory Visit (INDEPENDENT_AMBULATORY_CARE_PROVIDER_SITE_OTHER): Payer: Self-pay

## 2016-11-11 ENCOUNTER — Ambulatory Visit (HOSPITAL_COMMUNITY)
Admission: EM | Admit: 2016-11-11 | Discharge: 2016-11-11 | Disposition: A | Discharge status: Home / Self Care | Payer: Self-pay | Attending: Family Medicine | Admitting: Family Medicine

## 2016-11-11 ENCOUNTER — Encounter (HOSPITAL_COMMUNITY): Payer: Self-pay | Admitting: *Deleted

## 2016-11-11 DIAGNOSIS — J111 Influenza due to unidentified influenza virus with other respiratory manifestations: Secondary | ICD-10-CM

## 2016-11-11 DIAGNOSIS — R69 Illness, unspecified: Secondary | ICD-10-CM

## 2016-11-11 MED ORDER — IPRATROPIUM BROMIDE 0.06 % NA SOLN: 2.0000 | Freq: Four times a day (QID) | NASAL | 1 refills | Status: DC

## 2016-11-11 MED ORDER — GUAIFENESIN-CODEINE 100-10 MG/5ML PO SYRP: 10.0000 mL | ORAL_SOLUTION | Freq: Four times a day (QID) | ORAL | 0 refills | Status: DC | PRN

## 2016-11-11 MED ORDER — AZITHROMYCIN 250 MG PO TABS: ORAL_TABLET | ORAL | 0 refills | Status: DC

## 2016-11-11 NOTE — ED Provider Notes (Signed)
[facility]    CSN: [id number] Arrival date & time: 11/11/16  1127     History   Chief Complaint Chief Complaint  Patient presents with  . Cough    HPI Tammy Schwartz is a 61 y.o. female.   The history is provided by the patient.  Cough  Cough characteristics:  Productive Severity:  Moderate Onset quality:  Gradual Duration:  2 weeks Chronicity:  Recurrent Smoker: yes   Context: smoke exposure   Ineffective treatments:  None tried Associated symptoms: rhinorrhea   Associated symptoms: no fever and no wheezing     Past Medical History:  Diagnosis Date  . Chronic back pain    HNp,spondylosis,radiculopathy  . COPD (chronic obstructive pulmonary disease) (HCC)   . Hyperlipidemia    was on choloesterol meds last yr-samples given in office but not needed since  . Hypertension    takes Exforge daily  . Joint pain   . Pneumonia    hx of;many yrs ago  . Shortness of breath    takes Singulair daily;with exertion  . Sickle cell trait (HCC)   . Spondylolysis   . Tobacco use     Patient Active Problem List   Diagnosis Date Noted  . Acute bronchitis   . Chest pain 05/07/2016  . Acute kidney injury (HCC) 05/07/2016  . Chest pain at rest 05/07/2016  . Essential hypertension   . Hyperlipidemia   . Tobacco use   . Spondylolysis   . Chronic obstructive pulmonary disease (HCC)   . Lumbar spondylosis 01/20/2014  . HNP (herniated nucleus pulposus), lumbar 06/04/2012    Past Surgical History:  Procedure Laterality Date  . BACK SURGERY    . BREAST SURGERY Left    lumpectomy  . COLONOSCOPY    . DILATION AND CURETTAGE OF UTERUS    . LUMBAR LAMINECTOMY/DECOMPRESSION MICRODISCECTOMY  06/04/2012   Procedure: LUMBAR LAMINECTOMY/DECOMPRESSION MICRODISCECTOMY 1 LEVEL;  Surgeon: Carmela Hurt, MD;  Location: [facility];  Service: Neurosurgery;  Laterality: Left;  LEFT Lumbar five-sacral one diskectomy  . LUMBAR LAMINECTOMY/DECOMPRESSION MICRODISCECTOMY  Right 06/16/2013   Procedure: RIGHT Lumbar Five-Sacral One Microdiskectomy;  Surgeon: Carmela Hurt, MD;  Location: [facility];  Service: Neurosurgery;  Laterality: Right;  RIGHT Lumbar Five-Sacral One Microdiskectomy  . LUMBAR LAMINECTOMY/DECOMPRESSION MICRODISCECTOMY Right 01/20/2014   Procedure: LUMBAR FOUR TO LUMBAR FIVE LUMBAR LAMINECTOMY/DECOMPRESSION MICRODISCECTOMY 1 LEVEL;  Surgeon: Carmela Hurt, MD;  Location: [facility];  Service: Neurosurgery;  Laterality: Right;  Right L45 laminectomy and foramintomy    OB History    No data available       Home Medications    Prior to Admission medications   Medication Sig Start Date End Date Taking? Authorizing Provider  Amlodipine-Valsartan-HCTZ (EXFORGE HCT) 5-160-12.5 MG TABS Take 1 tablet by mouth daily. Resume in 1 week after you have seen PCP or if SBP > 140 05/15/16   Rodolph Bong, MD  azithromycin (ZITHROMAX Z-PAK) 250 MG tablet Take as directed on pack 11/11/16   Linna Hoff, MD  cyclobenzaprine (FLEXERIL) 10 MG tablet Take 1 tablet (10 mg total) by mouth 3 (three) times daily as needed for muscle spasms. 01/20/14   Coletta Memos, MD  fluticasone furoate-vilanterol (BREO ELLIPTA) 100-25 MCG/INH AEPB Inhale 1 puff into the lungs daily.    Historical Provider, MD  guaiFENesin-codeine (ROBITUSSIN AC) 100-10 MG/5ML syrup Take 10 mLs by mouth 4 (four) times daily as needed for cough. 11/11/16   Linna Hoff,  MD  hydrOXYzine (ATARAX/VISTARIL) 25 MG tablet Take 1 tablet (25 mg total) by mouth every 6 (six) hours as needed for anxiety or itching. 05/08/16   Rodolph Bong, MD  ipratropium (ATROVENT) 0.06 % nasal spray Place 2 sprays into both nostrils 4 (four) times daily. 11/11/16   Linna Hoff, MD  montelukast (SINGULAIR) 10 MG tablet Take 10 mg by mouth daily.     Historical Provider, MD  Multiple Vitamin (MULTIVITAMIN WITH MINERALS) TABS Take 1 tablet by mouth daily.    Historical Provider, MD  nicotine (NICODERM CQ - DOSED IN  MG/24 HOURS) 14 mg/24hr patch Place 1 patch (14 mg total) onto the skin daily. 05/08/16   Rodolph Bong, MD  oxyCODONE-acetaminophen (PERCOCET/ROXICET) 5-325 MG per tablet Take 1-2 tablets by mouth every 6 (six) hours as needed for severe pain. 01/20/14   Coletta Memos, MD  permethrin (ELIMITE) 5 % cream Apply 1 application topically daily.    Historical Provider, MD  VENTOLIN HFA 108 (90 Base) MCG/ACT inhaler Inhale 2 puffs into the lungs every 4 (four) hours as needed. Use 3 times daily x 4 days, then every 4 hours as needed. 05/08/16   Rodolph Bong, MD    Family History Family History  Problem Relation Age of Onset  . Cancer - Other Mother   . CAD Father   . Hypertension Sister     Social History Social History  Substance Use Topics  . Smoking status: Current Every Day Smoker    Packs/day: 0.25    Years: 20.00    Types: Cigarettes  . Smokeless tobacco: Never Used  . Alcohol use No     Allergies   Patient has no known allergies.   Review of Systems Review of Systems  Constitutional: Negative.  Negative for fever.  HENT: Positive for congestion and rhinorrhea.   Respiratory: Positive for cough. Negative for wheezing.   Cardiovascular: Negative.   Gastrointestinal: Negative.   All other systems reviewed and are negative.    Physical Exam Triage Vital Signs ED Triage Vitals [11/11/16 1229]  Enc Vitals Group     BP (!) 150/108     Pulse Rate 88     Resp 22     Temp 98.6 F (37 C)     Temp Source Oral     SpO2 98 %     Weight      Height      Head Circumference      Peak Flow      Pain Score      Pain Loc      Pain Edu?      Excl. in GC?    No data found.   Updated Vital Signs BP (!) 150/108 (BP Location: Right Arm)   Pulse 88   Temp 98.6 F (37 C) (Oral)   Resp 22   LMP 03/17/2012   SpO2 98%   Visual Acuity Right Eye Distance:   Left Eye Distance:   Bilateral Distance:    Right Eye Near:   Left Eye Near:    Bilateral Near:      Physical Exam  Constitutional: She is oriented to person, place, and time. She appears well-developed and well-nourished. No distress.  HENT:  Right Ear: External ear normal.  Left Ear: External ear normal.  Nose: Nose normal.  Mouth/Throat: Oropharynx is clear and moist.  Neck: Normal range of motion. Neck supple.  Cardiovascular: Normal rate, regular rhythm, normal heart sounds and intact distal  pulses.   Pulmonary/Chest: Effort normal and breath sounds normal.  Lymphadenopathy:    She has no cervical adenopathy.  Neurological: She is alert and oriented to person, place, and time.  Skin: Skin is warm and dry.  Nursing note and vitals reviewed.    UC Treatments / Results  Labs (all labs ordered are listed, but only abnormal results are displayed) Labs Reviewed - No data to display  EKG  EKG Interpretation None       Radiology No results found.  Procedures Procedures (including critical care time)  Medications Ordered in UC Medications - No data to display   Initial Impression / Assessment and Plan / UC Course  I have reviewed the triage vital signs and the nursing notes.  Pertinent labs & imaging results that were available during my care of the patient were reviewed by me and considered in my medical decision making (see chart for details).       Final Clinical Impressions(s) / UC Diagnoses   Final diagnoses:  Influenza-like illness    New Prescriptions Discharge Medication List as of 11/11/2016  1:52 PM    START taking these medications   Details  azithromycin (ZITHROMAX Z-PAK) 250 MG tablet Take as directed on pack, Print    guaiFENesin-codeine (ROBITUSSIN AC) 100-10 MG/5ML syrup Take 10 mLs by mouth 4 (four) times daily as needed for cough., Starting Tue 11/11/2016, Print    ipratropium (ATROVENT) 0.06 % nasal spray Place 2 sprays into both nostrils 4 (four) times daily., Starting Tue 11/11/2016, Print         Linna HoffJames D Elliyah Liszewski, MD 11/13/16  [phone]

## 2016-11-11 NOTE — ED Triage Notes (Signed)
Pt  Reports   Cough   And  Congestion   With   Symptoms  X   Several    Weeks   Pt  Reports   History  Of   smoking  And  Is  A  Current  Smoker   She     Reports  Uses  An inhaler   As    Well

## 2016-11-11 NOTE — ED Notes (Signed)
Pt had several questions not related to her chief complaint and diagnosis.  I directed the pt to be seen by her PCP for further evaluation for chronic issues related to pain and problems with her appetite.

## 2016-12-11 ENCOUNTER — Ambulatory Visit (HOSPITAL_COMMUNITY)
Admission: EM | Admit: 2016-12-11 | Discharge: 2016-12-11 | Disposition: A | Discharge status: Home / Self Care | Payer: Self-pay | Attending: Internal Medicine | Admitting: Internal Medicine

## 2016-12-11 ENCOUNTER — Encounter (HOSPITAL_COMMUNITY): Payer: Self-pay | Admitting: *Deleted

## 2016-12-11 DIAGNOSIS — M79604 Pain in right leg: Secondary | ICD-10-CM

## 2016-12-11 DIAGNOSIS — M79605 Pain in left leg: Secondary | ICD-10-CM

## 2016-12-11 MED ORDER — CYCLOBENZAPRINE HCL 10 MG PO TABS: 10.0000 mg | ORAL_TABLET | Freq: Two times a day (BID) | ORAL | 0 refills | Status: DC | PRN

## 2016-12-11 MED ORDER — DICLOFENAC SODIUM 75 MG PO TBEC: 75.0000 mg | DELAYED_RELEASE_TABLET | Freq: Two times a day (BID) | ORAL | 0 refills | Status: DC

## 2016-12-11 NOTE — Discharge Instructions (Signed)
For your pain I have prescribed 2 different medicines. I prescribed Flexeril, take one tablet twice a day as needed. This medicine may cause drowsiness, I advise against driving or operating heavy machinery until you know how this medicine affects you. I also recommend avoiding alcohol while taking this medicine. Also prescribed diclofenac, take one tablet twice a day as needed for pain. She just symptoms continue, follow up with her primary care provider, or return to clinic as needed.

## 2016-12-11 NOTE — ED Provider Notes (Signed)
CSN: [id number]     Arrival date & time 12/11/16  1543 History   None    Chief Complaint  Patient presents with  . Leg Pain   (Consider location/radiation/quality/duration/timing/severity/associated sxs/prior Treatment) 61 year old female presents to clinic with a 3 to 4-week history of bilateral leg pain. States her pain is a continual, throbbing sensation as well as a dull achy sensation. She reports she had a similar type of pain several years ago prior to her back surgery, and that following her surgery the pain resolved. She has no loss of sensation in her extremities, she's had no swelling in her ankles or feet, no burning or tingling sensations. She is taken over-the-counter Aleve with some relief.   The history is provided by the patient.  Leg Pain    Past Medical History:  Diagnosis Date  . Chronic back pain    HNp,spondylosis,radiculopathy  . COPD (chronic obstructive pulmonary disease) (HCC)   . Hyperlipidemia    was on choloesterol meds last yr-samples given in office but not needed since  . Hypertension    takes Exforge daily  . Joint pain   . Pneumonia    hx of;many yrs ago  . Shortness of breath    takes Singulair daily;with exertion  . Sickle cell trait (HCC)   . Spondylolysis   . Tobacco use    Past Surgical History:  Procedure Laterality Date  . BACK SURGERY    . BREAST SURGERY Left    lumpectomy  . COLONOSCOPY    . DILATION AND CURETTAGE OF UTERUS    . LUMBAR LAMINECTOMY/DECOMPRESSION MICRODISCECTOMY  06/04/2012   Procedure: LUMBAR LAMINECTOMY/DECOMPRESSION MICRODISCECTOMY 1 LEVEL;  Surgeon: Carmela Hurt, MD;  Location: [facility];  Service: Neurosurgery;  Laterality: Left;  LEFT Lumbar five-sacral one diskectomy  . LUMBAR LAMINECTOMY/DECOMPRESSION MICRODISCECTOMY Right 06/16/2013   Procedure: RIGHT Lumbar Five-Sacral One Microdiskectomy;  Surgeon: Carmela Hurt, MD;  Location: [facility];  Service: Neurosurgery;  Laterality: Right;  RIGHT Lumbar  Five-Sacral One Microdiskectomy  . LUMBAR LAMINECTOMY/DECOMPRESSION MICRODISCECTOMY Right 01/20/2014   Procedure: LUMBAR FOUR TO LUMBAR FIVE LUMBAR LAMINECTOMY/DECOMPRESSION MICRODISCECTOMY 1 LEVEL;  Surgeon: Carmela Hurt, MD;  Location: [facility];  Service: Neurosurgery;  Laterality: Right;  Right L45 laminectomy and foramintomy   Family History  Problem Relation Age of Onset  . Cancer - Other Mother   . CAD Father   . Hypertension Sister    Social History  Substance Use Topics  . Smoking status: Current Every Day Smoker    Packs/day: 0.25    Years: 20.00    Types: Cigarettes  . Smokeless tobacco: Never Used  . Alcohol use No   OB History    No data available     Review of Systems  Reason unable to perform ROS: As covered in history of present illness.  All other systems reviewed and are negative.   Allergies  Patient has no known allergies.  Home Medications   Prior to Admission medications   Medication Sig Start Date End Date Taking? Authorizing Provider  Amlodipine-Valsartan-HCTZ (EXFORGE HCT) 5-160-12.5 MG TABS Take 1 tablet by mouth daily. Resume in 1 week after you have seen PCP or if SBP > 140 05/15/16   Rodolph Bong, MD  cyclobenzaprine (FLEXERIL) 10 MG tablet Take 1 tablet (10 mg total) by mouth 2 (two) times daily as needed for muscle spasms. 12/11/16   Dorena Bodo, NP  diclofenac (VOLTAREN) 75 MG EC tablet Take 1 tablet (75  mg total) by mouth 2 (two) times daily. 12/11/16   Dorena BodoLawrence Emmamarie Kluender, NP  fluticasone furoate-vilanterol (BREO ELLIPTA) 100-25 MCG/INH AEPB Inhale 1 puff into the lungs daily.    Historical Provider, MD  hydrOXYzine (ATARAX/VISTARIL) 25 MG tablet Take 1 tablet (25 mg total) by mouth every 6 (six) hours as needed for anxiety or itching. 05/08/16   Rodolph Bonganiel Thompson V, MD  ipratropium (ATROVENT) 0.06 % nasal spray Place 2 sprays into both nostrils 4 (four) times daily. 11/11/16   Linna HoffJames D Kindl, MD  montelukast (SINGULAIR) 10 MG tablet Take 10  mg by mouth daily.     Historical Provider, MD  Multiple Vitamin (MULTIVITAMIN WITH MINERALS) TABS Take 1 tablet by mouth daily.    Historical Provider, MD  nicotine (NICODERM CQ - DOSED IN MG/24 HOURS) 14 mg/24hr patch Place 1 patch (14 mg total) onto the skin daily. 05/08/16   Rodolph Bonganiel Thompson V, MD  oxyCODONE-acetaminophen (PERCOCET/ROXICET) 5-325 MG per tablet Take 1-2 tablets by mouth every 6 (six) hours as needed for severe pain. 01/20/14   Coletta MemosKyle Cabbell, MD  permethrin (ELIMITE) 5 % cream Apply 1 application topically daily.    Historical Provider, MD  VENTOLIN HFA 108 (90 Base) MCG/ACT inhaler Inhale 2 puffs into the lungs every 4 (four) hours as needed. Use 3 times daily x 4 days, then every 4 hours as needed. 05/08/16   Rodolph Bonganiel Thompson V, MD   Meds Ordered and Administered this Visit  Medications - No data to display  BP (!) 150/108   Pulse 80   Temp 98.6 F (37 C) (Oral)   Resp 18   LMP 03/17/2012   SpO2 100%  No data found.   Physical Exam  Constitutional: She is oriented to person, place, and time. She appears well-developed and well-nourished. No distress.  HENT:  Head: Normocephalic and atraumatic.  Cardiovascular: Normal rate and regular rhythm.   Pulmonary/Chest: Effort normal and breath sounds normal.  Musculoskeletal:  Musculoskeletal exam was unremarkable  Neurological: She is alert and oriented to person, place, and time.  Skin: Skin is warm and dry. Capillary refill takes less than 2 seconds. She is not diaphoretic.  Psychiatric: She has a normal mood and affect.  Nursing note and vitals reviewed.   Urgent Care Course     Procedures (including critical care time)  Labs Review Labs Reviewed - No data to display  Imaging Review No results found.  :    MDM   1. Bilateral leg pain    For your pain I have prescribed 2 different medicines. I prescribed Flexeril, take one tablet twice a day as needed. This medicine may cause drowsiness, I advise against  driving or operating heavy machinery until you know how this medicine affects you. I also recommend avoiding alcohol while taking this medicine. Also prescribed diclofenac, take one tablet twice a day as needed for pain. She just symptoms continue, follow up with her primary care provider, or return to clinic as needed.      Dorena BodoLawrence Ronak Duquette, NP 12/11/16 1630

## 2016-12-11 NOTE — ED Triage Notes (Signed)
Pt  Reports  Bilateral  Upper  Leg  Pain   With  Symptoms  X  Several  Weeks     Pt  denys  Any  specefic  Injury  She  Reports the  Pain is  A  Constant  Ache       She   Ambulated  To  Room   With a  Slow  Steady     Gait

## 2017-02-07 ENCOUNTER — Emergency Department (HOSPITAL_COMMUNITY): Payer: Self-pay

## 2017-02-07 ENCOUNTER — Emergency Department (HOSPITAL_COMMUNITY)
Admission: EM | Admit: 2017-02-07 | Discharge: 2017-02-07 | Disposition: A | Discharge status: Home / Self Care | Payer: Self-pay | Attending: Emergency Medicine | Admitting: Emergency Medicine

## 2017-02-07 DIAGNOSIS — M79661 Pain in right lower leg: Secondary | ICD-10-CM | POA: Insufficient documentation

## 2017-02-07 DIAGNOSIS — M79662 Pain in left lower leg: Secondary | ICD-10-CM | POA: Insufficient documentation

## 2017-02-07 DIAGNOSIS — J441 Chronic obstructive pulmonary disease with (acute) exacerbation: Secondary | ICD-10-CM

## 2017-02-07 DIAGNOSIS — G8929 Other chronic pain: Secondary | ICD-10-CM

## 2017-02-07 DIAGNOSIS — M79604 Pain in right leg: Secondary | ICD-10-CM

## 2017-02-07 DIAGNOSIS — Z79899 Other long term (current) drug therapy: Secondary | ICD-10-CM | POA: Insufficient documentation

## 2017-02-07 DIAGNOSIS — J449 Chronic obstructive pulmonary disease, unspecified: Secondary | ICD-10-CM | POA: Insufficient documentation

## 2017-02-07 DIAGNOSIS — F17210 Nicotine dependence, cigarettes, uncomplicated: Secondary | ICD-10-CM | POA: Insufficient documentation

## 2017-02-07 DIAGNOSIS — I10 Essential (primary) hypertension: Secondary | ICD-10-CM | POA: Insufficient documentation

## 2017-02-07 DIAGNOSIS — M79605 Pain in left leg: Secondary | ICD-10-CM

## 2017-02-07 LAB — BASIC METABOLIC PANEL
Anion gap: 11 (ref 5–15)
BUN: 10 mg/dL (ref 6–20)
CALCIUM: 9.7 mg/dL (ref 8.9–10.3)
CO2: 23 mmol/L (ref 22–32)
CREATININE: 0.9 mg/dL (ref 0.44–1.00)
Chloride: 109 mmol/L (ref 101–111)
GFR calc Af Amer: >60 mL/min (ref >60)
Glucose, Bld: 92 mg/dL (ref 65–99)
POTASSIUM: 3.1 mmol/L — AB (ref 3.5–5.1)
SODIUM: 143 mmol/L (ref 135–145)

## 2017-02-07 LAB — CBC WITH DIFFERENTIAL/PLATELET
BASOS ABS: 0 10*3/uL (ref 0.0–0.1)
Basophils Relative: 0 %
EOS ABS: 0.1 10*3/uL (ref 0.0–0.7)
EOS PCT: 2 %
HCT: 40.2 % (ref 36.0–46.0)
HEMOGLOBIN: 13.7 g/dL (ref 12.0–15.0)
Lymphocytes Relative: 38 %
Lymphs Abs: 3 10*3/uL (ref 0.7–4.0)
MCH: 29.1 pg (ref 26.0–34.0)
MCHC: 34.1 g/dL (ref 30.0–36.0)
MCV: 85.4 fL (ref 78.0–100.0)
Monocytes Absolute: 0.4 10*3/uL (ref 0.1–1.0)
Monocytes Relative: 5 %
Neutro Abs: 4.3 10*3/uL (ref 1.7–7.7)
Neutrophils Relative %: 55 %
Platelets: 259 10*3/uL (ref 150–400)
RBC: 4.71 MIL/uL (ref 3.87–5.11)
RDW: 15.2 % (ref 11.5–15.5)
WBC: 7.8 10*3/uL (ref 4.0–10.5)

## 2017-02-07 LAB — I-STAT TROPONIN, ED: Troponin i, poc: 0 ng/mL (ref 0.00–0.08)

## 2017-02-07 MED ORDER — METHYLPREDNISOLONE SODIUM SUCC 125 MG IJ SOLR: 125.0000 mg | Freq: Once | INTRAMUSCULAR | Status: DC

## 2017-02-07 MED ORDER — AZITHROMYCIN 250 MG PO TABS
500.0000 mg | ORAL_TABLET | Freq: Once | ORAL | Status: AC
  Administered 2017-02-07: 500 mg via ORAL
  Filled 2017-02-07: qty 2

## 2017-02-07 MED ORDER — AZITHROMYCIN 250 MG PO TABS: 250.0000 mg | ORAL_TABLET | Freq: Every day | ORAL | 0 refills | Status: DC

## 2017-02-07 MED ORDER — PREDNISONE 20 MG PO TABS
60.0000 mg | ORAL_TABLET | Freq: Once | ORAL | Status: AC
  Administered 2017-02-07: 60 mg via ORAL
  Filled 2017-02-07: qty 3

## 2017-02-07 MED ORDER — ALBUTEROL SULFATE HFA 108 (90 BASE) MCG/ACT IN AERS
1.0000 | INHALATION_SPRAY | Freq: Four times a day (QID) | RESPIRATORY_TRACT | Status: DC
  Administered 2017-02-07: 1 via RESPIRATORY_TRACT
  Filled 2017-02-07: qty 6.7

## 2017-02-07 MED ORDER — ONDANSETRON 4 MG PO TBDP
8.0000 mg | ORAL_TABLET | Freq: Once | ORAL | Status: AC
  Administered 2017-02-07: 8 mg via ORAL
  Filled 2017-02-07: qty 2

## 2017-02-07 MED ORDER — IPRATROPIUM-ALBUTEROL 0.5-2.5 (3) MG/3ML IN SOLN
3.0000 mL | Freq: Once | RESPIRATORY_TRACT | Status: AC
  Administered 2017-02-07: 3 mL via RESPIRATORY_TRACT
  Filled 2017-02-07: qty 3

## 2017-02-07 MED ORDER — MELOXICAM 15 MG PO TABS: 15.0000 mg | ORAL_TABLET | Freq: Every day | ORAL | 0 refills | Status: DC

## 2017-02-07 MED ORDER — PREDNISONE 20 MG PO TABS: 40.0000 mg | ORAL_TABLET | Freq: Every day | ORAL | 0 refills | Status: DC

## 2017-02-07 NOTE — ED Triage Notes (Signed)
Pt transported from home by EMS with c/o pain initially in chest moving into throat, +shob, per EMS pt was very anxious on arrival. 170/110, 100, 100%. Denies n/v.

## 2017-02-07 NOTE — ED Notes (Signed)
Pt requests pain medicine for legs-- has taken aleve at home, without relief. Both legs hurt with walking, "can't stand on them too long-- hurts til they give out. "

## 2017-02-07 NOTE — Discharge Instructions (Signed)
Use inhaler as needed for shortness of breath Take steroid and antibiotic for the next 5 days - you have already had one dose of each today Take Meloxicam as needed for pain Make appointment with [facility] - tell them it is a follow up ER visit

## 2017-02-07 NOTE — ED Notes (Signed)
Pt's resp easy and unlabored

## 2017-02-07 NOTE — ED Provider Notes (Signed)
[facility] DEPT Provider Note   CSN: [id number] Arrival date & time: 02/07/17  4403     History   Chief Complaint Chief Complaint  Patient presents with  . Chest Pain    HPI Tammy Schwartz is a 61 y.o. female who presents with SOB. PMH significant for hx of COPD, current tobacco user, chronic pain, HTN. She is somewhat of a poor historian. She states that she woke up this morning with acute onset of SOB while walking to get a glass of water. She has associated cough and wheezing. She has a lot of mucous in her throat and feels congested and reports a "cold" for three weeks. Initially she reported chest pain but states that she just has pain with coughing. She was hospitalized for chest pain in July 2017 and had a low risk stress test. Echo showed preserved EF of 65-70% with grade 2 diastolic dysfunction. No fever, chills, constant chest pain, abdominal pain, N/V. Daughter and husband are at beside and also bring up that she has chronic pain in both legs, occasional foot swelling, and decreased appetite. She does not have a PCP. She has been taking Benadryl, Robitussin, and Mucinex with minimal relief.  HPI  Past Medical History:  Diagnosis Date  . Chronic back pain    HNp,spondylosis,radiculopathy  . COPD (chronic obstructive pulmonary disease) (HCC)   . Hyperlipidemia    was on choloesterol meds last yr-samples given in office but not needed since  . Hypertension    takes Exforge daily  . Joint pain   . Pneumonia    hx of;many yrs ago  . Shortness of breath    takes Singulair daily;with exertion  . Sickle cell trait (HCC)   . Spondylolysis   . Tobacco use     Patient Active Problem List   Diagnosis Date Noted  . Acute bronchitis   . Chest pain 05/07/2016  . Acute kidney injury (HCC) 05/07/2016  . Chest pain at rest 05/07/2016  . Essential hypertension   . Hyperlipidemia   . Tobacco use   . Spondylolysis   . Chronic obstructive pulmonary disease (HCC)   .  Lumbar spondylosis 01/20/2014  . HNP (herniated nucleus pulposus), lumbar 06/04/2012    Past Surgical History:  Procedure Laterality Date  . BACK SURGERY    . BREAST SURGERY Left    lumpectomy  . COLONOSCOPY    . DILATION AND CURETTAGE OF UTERUS    . LUMBAR LAMINECTOMY/DECOMPRESSION MICRODISCECTOMY  06/04/2012   Procedure: LUMBAR LAMINECTOMY/DECOMPRESSION MICRODISCECTOMY 1 LEVEL;  Surgeon: Carmela Hurt, MD;  Location: [facility];  Service: Neurosurgery;  Laterality: Left;  LEFT Lumbar five-sacral one diskectomy  . LUMBAR LAMINECTOMY/DECOMPRESSION MICRODISCECTOMY Right 06/16/2013   Procedure: RIGHT Lumbar Five-Sacral One Microdiskectomy;  Surgeon: Carmela Hurt, MD;  Location: [facility];  Service: Neurosurgery;  Laterality: Right;  RIGHT Lumbar Five-Sacral One Microdiskectomy  . LUMBAR LAMINECTOMY/DECOMPRESSION MICRODISCECTOMY Right 01/20/2014   Procedure: LUMBAR FOUR TO LUMBAR FIVE LUMBAR LAMINECTOMY/DECOMPRESSION MICRODISCECTOMY 1 LEVEL;  Surgeon: Carmela Hurt, MD;  Location: [facility];  Service: Neurosurgery;  Laterality: Right;  Right L45 laminectomy and foramintomy    OB History    No data available       Home Medications    Prior to Admission medications   Medication Sig Start Date End Date Taking? Authorizing Provider  Amlodipine-Valsartan-HCTZ (EXFORGE HCT) 5-160-12.5 MG TABS Take 1 tablet by mouth daily. Resume in 1 week after you have seen PCP or if SBP >  140 05/15/16   Rodolph Bong, MD  cyclobenzaprine (FLEXERIL) 10 MG tablet Take 1 tablet (10 mg total) by mouth 2 (two) times daily as needed for muscle spasms. 12/11/16   Dorena Bodo, NP  diclofenac (VOLTAREN) 75 MG EC tablet Take 1 tablet (75 mg total) by mouth 2 (two) times daily. 12/11/16   Dorena Bodo, NP  fluticasone furoate-vilanterol (BREO ELLIPTA) 100-25 MCG/INH AEPB Inhale 1 puff into the lungs daily.    Historical Provider, MD  hydrOXYzine (ATARAX/VISTARIL) 25 MG tablet Take 1 tablet (25 mg total)  by mouth every 6 (six) hours as needed for anxiety or itching. 05/08/16   Rodolph Bong, MD  ipratropium (ATROVENT) 0.06 % nasal spray Place 2 sprays into both nostrils 4 (four) times daily. 11/11/16   Linna Hoff, MD  montelukast (SINGULAIR) 10 MG tablet Take 10 mg by mouth daily.     Historical Provider, MD  Multiple Vitamin (MULTIVITAMIN WITH MINERALS) TABS Take 1 tablet by mouth daily.    Historical Provider, MD  nicotine (NICODERM CQ - DOSED IN MG/24 HOURS) 14 mg/24hr patch Place 1 patch (14 mg total) onto the skin daily. 05/08/16   Rodolph Bong, MD  oxyCODONE-acetaminophen (PERCOCET/ROXICET) 5-325 MG per tablet Take 1-2 tablets by mouth every 6 (six) hours as needed for severe pain. 01/20/14   Coletta Memos, MD  permethrin (ELIMITE) 5 % cream Apply 1 application topically daily.    Historical Provider, MD  VENTOLIN HFA 108 (90 Base) MCG/ACT inhaler Inhale 2 puffs into the lungs every 4 (four) hours as needed. Use 3 times daily x 4 days, then every 4 hours as needed. 05/08/16   Rodolph Bong, MD    Family History Family History  Problem Relation Age of Onset  . Cancer - Other Mother   . CAD Father   . Hypertension Sister     Social History Social History  Substance Use Topics  . Smoking status: Current Every Day Smoker    Packs/day: 0.25    Years: 20.00    Types: Cigarettes  . Smokeless tobacco: Never Used  . Alcohol use No     Allergies   Patient has no known allergies.   Review of Systems Review of Systems  Constitutional: Positive for appetite change. Negative for chills and fever.  HENT: Positive for congestion. Negative for trouble swallowing.   Respiratory: Positive for cough, shortness of breath and wheezing.   Cardiovascular: Positive for chest pain (with cough). Negative for palpitations and leg swelling.  Gastrointestinal: Negative for abdominal pain, nausea and vomiting.  All other systems reviewed and are negative.    Physical Exam Updated Vital  Signs BP (!) 161/96 (BP Location: Right Arm)   Pulse 94   Resp 13   LMP 03/17/2012   SpO2 96%   Physical Exam  Constitutional: She is oriented to person, place, and time. She appears well-developed and well-nourished. No distress.  HENT:  Head: Normocephalic and atraumatic.  Right Ear: Hearing, tympanic membrane, external ear and ear canal normal.  Left Ear: Hearing, tympanic membrane, external ear and ear canal normal.  Nose: Nose normal.  Mouth/Throat: Uvula is midline, oropharynx is clear and moist and mucous membranes are normal.  Eyes: Conjunctivae are normal. Pupils are equal, round, and reactive to light. Right eye exhibits no discharge. Left eye exhibits no discharge. No scleral icterus.  Neck: Normal range of motion.  Cardiovascular: Normal rate and regular rhythm.  Exam reveals no gallop and no friction rub.  No murmur heard. Pulmonary/Chest: Effort normal. No respiratory distress. She has decreased breath sounds. She has no wheezes. She has no rales. She exhibits no tenderness.  Abdominal: Soft. Bowel sounds are normal. She exhibits no distension and no mass. There is no tenderness. There is no rebound and no guarding.  Musculoskeletal:  2+ DP pulse bilaterally  Neurological: She is alert and oriented to person, place, and time.  Skin: Skin is warm and dry.  Psychiatric: She has a normal mood and affect. Her behavior is normal.  Nursing note and vitals reviewed.    ED Treatments / Results  Labs (all labs ordered are listed, but only abnormal results are displayed) Labs Reviewed  BASIC METABOLIC PANEL - Abnormal; Notable for the following:       Result Value   Potassium 3.1 (*)    All other components within normal limits  CBC WITH DIFFERENTIAL/PLATELET  Rosezena Sensor, ED    EKG  EKG Interpretation None       Radiology Dg Chest 2 View  Result Date: 02/07/2017 CLINICAL DATA:  Shortness of Breath EXAM: CHEST  2 VIEW COMPARISON:  November 11, 2016  FINDINGS: Lungs are clear. Heart size and pulmonary vascularity are normal. No adenopathy. There is aortic atherosclerosis. No bone lesions. IMPRESSION: No edema or consolidation.  Aortic atherosclerosis. Electronically Signed   By: Bretta Bang III M.D.   On: 02/07/2017 08:21    Procedures Procedures (including critical care time)  Medications Ordered in ED Medications  albuterol (PROVENTIL HFA;VENTOLIN HFA) 108 (90 Base) MCG/ACT inhaler 1-2 puff (1 puff Inhalation Given 02/07/17 0921)  ipratropium-albuterol (DUONEB) 0.5-2.5 (3) MG/3ML nebulizer solution 3 mL (3 mLs Nebulization Given 02/07/17 0714)  azithromycin (ZITHROMAX) tablet 500 mg (500 mg Oral Given 02/07/17 0923)  predniSONE (DELTASONE) tablet 60 mg (60 mg Oral Given 02/07/17 0935)  ondansetron (ZOFRAN-ODT) disintegrating tablet 8 mg (8 mg Oral Given 02/07/17 1003)     Initial Impression / Assessment and Plan / ED Course  I have reviewed the triage vital signs and the nursing notes.  Pertinent labs & imaging results that were available during my care of the patient were reviewed by me and considered in my medical decision making (see chart for details).  61 year old female with acute, mild COPD exacerbation. She is hypertensive but otherwise vitals are normal. Lungs have decreased breath sounds and she is initially in mild distress although has normal O2 sats on RA. Duoneb tx given.   On recheck, she feels mildly improved. Labs overall unremarkable. She has mild hypokalemia. Trop is 0. EKG is NSR. CXR clear. Discussed her chronic issues with her and family at length. Stressed importance of establishing care with PCP for these problems. Will d/c with albuterol, Z-pack, Meloxicam. Advised follow up with [facility].   Final Clinical Impressions(s) / ED Diagnoses   Final diagnoses:  COPD exacerbation (HCC)  Chronic pain of both lower extremities    New Prescriptions New Prescriptions   No medications on file      Bethel Born, PA-C 02/07/17 1156    Margarita Grizzle, MD 02/07/17 [phone]

## 2017-02-11 ENCOUNTER — Other Ambulatory Visit: Payer: Self-pay

## 2017-02-11 ENCOUNTER — Encounter: Payer: Self-pay | Admitting: Internal Medicine

## 2017-02-11 ENCOUNTER — Ambulatory Visit: Payer: Self-pay | Attending: Internal Medicine | Admitting: Internal Medicine

## 2017-02-11 VITALS — BP 159/95 | HR 91 | Temp 98.3°F | Wt 129.6 lb

## 2017-02-11 DIAGNOSIS — R0789 Other chest pain: Secondary | ICD-10-CM

## 2017-02-11 DIAGNOSIS — J42 Unspecified chronic bronchitis: Secondary | ICD-10-CM

## 2017-02-11 DIAGNOSIS — G8929 Other chronic pain: Secondary | ICD-10-CM | POA: Insufficient documentation

## 2017-02-11 DIAGNOSIS — D573 Sickle-cell trait: Secondary | ICD-10-CM | POA: Insufficient documentation

## 2017-02-11 DIAGNOSIS — E785 Hyperlipidemia, unspecified: Secondary | ICD-10-CM | POA: Insufficient documentation

## 2017-02-11 DIAGNOSIS — R079 Chest pain, unspecified: Secondary | ICD-10-CM | POA: Insufficient documentation

## 2017-02-11 DIAGNOSIS — Z8249 Family history of ischemic heart disease and other diseases of the circulatory system: Secondary | ICD-10-CM | POA: Insufficient documentation

## 2017-02-11 DIAGNOSIS — I10 Essential (primary) hypertension: Secondary | ICD-10-CM

## 2017-02-11 DIAGNOSIS — F17210 Nicotine dependence, cigarettes, uncomplicated: Secondary | ICD-10-CM | POA: Insufficient documentation

## 2017-02-11 DIAGNOSIS — J449 Chronic obstructive pulmonary disease, unspecified: Secondary | ICD-10-CM | POA: Insufficient documentation

## 2017-02-11 DIAGNOSIS — Z79899 Other long term (current) drug therapy: Secondary | ICD-10-CM | POA: Insufficient documentation

## 2017-02-11 DIAGNOSIS — Z79891 Long term (current) use of opiate analgesic: Secondary | ICD-10-CM | POA: Insufficient documentation

## 2017-02-11 DIAGNOSIS — K089 Disorder of teeth and supporting structures, unspecified: Secondary | ICD-10-CM

## 2017-02-11 MED ORDER — AMLODIPINE-VALSARTAN-HCTZ 5-160-12.5 MG PO TABS: 1.0000 | ORAL_TABLET | Freq: Every day | ORAL | Status: DC

## 2017-02-11 NOTE — Assessment & Plan Note (Signed)
Her CP is reporducible in the office to palpation. She had normal nuclear study in 2017 EKG normal Low risk.

## 2017-02-11 NOTE — Progress Notes (Signed)
SOB- patient tells me she was recently in  ED for COPD exacerbation. She is taking inhalers and doing reasonably well. She still hears herself wheezing and has some SOB if she walks to fast.  meds she is only taking meloxicam, ventolin, prednisone, fluticasone.   She has not been taking BP meds  She has had ongoing, recurrent chest pains. She typically takes two ASA and it resolves. Reviewed NM study from 7/17- negative. Echo with preserved EF and evidence of diastolic dysfunction.   She also tells me that she has been trying to get dental care without success. Long term poor dentition  Past Medical History:  Diagnosis Date  . Chronic back pain    HNp,spondylosis,radiculopathy  . COPD (chronic obstructive pulmonary disease) (HCC)   . Hyperlipidemia    was on choloesterol meds last yr-samples given in office but not needed since  . Hypertension    takes Exforge daily  . Joint pain   . Pneumonia    hx of;many yrs ago  . Shortness of breath    takes Singulair daily;with exertion  . Sickle cell trait (HCC)   . Spondylolysis   . Tobacco use     Social History   Social History  . Marital status: Married    Spouse name: N/A  . Number of children: N/A  . Years of education: N/A   Occupational History  . Not on file.   Social History Main Topics  . Smoking status: Current Every Day Smoker    Packs/day: 0.25    Years: 20.00    Types: Cigarettes  . Smokeless tobacco: Never Used  . Alcohol use No  . Drug use: No  . Sexual activity: Not Currently    Birth control/ protection: Post-menopausal   Other Topics Concern  . Not on file   Social History Narrative  . No narrative on file    Past Surgical History:  Procedure Laterality Date  . BACK SURGERY    . BREAST SURGERY Left    lumpectomy  . COLONOSCOPY    . DILATION AND CURETTAGE OF UTERUS    . LUMBAR LAMINECTOMY/DECOMPRESSION MICRODISCECTOMY  06/04/2012   Procedure: LUMBAR LAMINECTOMY/DECOMPRESSION MICRODISCECTOMY 1  LEVEL;  Surgeon: Carmela Hurt, MD;  Location: [facility];  Service: Neurosurgery;  Laterality: Left;  LEFT Lumbar five-sacral one diskectomy  . LUMBAR LAMINECTOMY/DECOMPRESSION MICRODISCECTOMY Right 06/16/2013   Procedure: RIGHT Lumbar Five-Sacral One Microdiskectomy;  Surgeon: Carmela Hurt, MD;  Location: [facility];  Service: Neurosurgery;  Laterality: Right;  RIGHT Lumbar Five-Sacral One Microdiskectomy  . LUMBAR LAMINECTOMY/DECOMPRESSION MICRODISCECTOMY Right 01/20/2014   Procedure: LUMBAR FOUR TO LUMBAR FIVE LUMBAR LAMINECTOMY/DECOMPRESSION MICRODISCECTOMY 1 LEVEL;  Surgeon: Carmela Hurt, MD;  Location: [facility];  Service: Neurosurgery;  Laterality: Right;  Right L45 laminectomy and foramintomy    Family History  Problem Relation Age of Onset  . Cancer - Other Mother   . CAD Father   . Hypertension Sister     No Known Allergies  Current Outpatient Prescriptions on File Prior to Visit  Medication Sig Dispense Refill  . azithromycin (ZITHROMAX) 250 MG tablet Take 1 tablet (250 mg total) by mouth daily. Take one tablet daily 4 tablet 0  . fluticasone furoate-vilanterol (BREO ELLIPTA) 100-25 MCG/INH AEPB Inhale 1 puff into the lungs daily.    . meloxicam (MOBIC) 15 MG tablet Take 1 tablet (15 mg total) by mouth daily. 30 tablet 0  . predniSONE (DELTASONE) 20 MG tablet Take 2 tablets (40  mg total) by mouth daily. 8 tablet 0  . VENTOLIN HFA 108 (90 Base) MCG/ACT inhaler Inhale 2 puffs into the lungs every 4 (four) hours as needed. Use 3 times daily x 4 days, then every 4 hours as needed. 1 Inhaler 3  . Amlodipine-Valsartan-HCTZ (EXFORGE HCT) 5-160-12.5 MG TABS Take 1 tablet by mouth daily. Resume in 1 week after you have seen PCP or if SBP > 140 (Patient not taking: Reported on 02/11/2017) 30 tablet   . cyclobenzaprine (FLEXERIL) 10 MG tablet Take 1 tablet (10 mg total) by mouth 2 (two) times daily as needed for muscle spasms. (Patient not taking: Reported on 02/11/2017) 20 tablet 0  .  diclofenac (VOLTAREN) 75 MG EC tablet Take 1 tablet (75 mg total) by mouth 2 (two) times daily. (Patient not taking: Reported on 02/11/2017) 20 tablet 0  . hydrOXYzine (ATARAX/VISTARIL) 25 MG tablet Take 1 tablet (25 mg total) by mouth every 6 (six) hours as needed for anxiety or itching. (Patient not taking: Reported on 02/11/2017) 20 tablet 0  . ipratropium (ATROVENT) 0.06 % nasal spray Place 2 sprays into both nostrils 4 (four) times daily. (Patient not taking: Reported on 02/11/2017) 15 mL 1  . montelukast (SINGULAIR) 10 MG tablet Take 10 mg by mouth daily.     . Multiple Vitamin (MULTIVITAMIN WITH MINERALS) TABS Take 1 tablet by mouth daily.    . nicotine (NICODERM CQ - DOSED IN MG/24 HOURS) 14 mg/24hr patch Place 1 patch (14 mg total) onto the skin daily. (Patient not taking: Reported on 02/11/2017) 28 patch 0  . oxyCODONE-acetaminophen (PERCOCET/ROXICET) 5-325 MG per tablet Take 1-2 tablets by mouth every 6 (six) hours as needed for severe pain. (Patient not taking: Reported on 02/11/2017) 80 tablet 0  . permethrin (ELIMITE) 5 % cream Apply 1 application topically daily.     No current facility-administered medications on file prior to visit.      patient denies chest pain, shortness of breath, orthopnea. Denies lower extremity edema, abdominal pain, change in appetite, change in bowel movements. Patient denies rashes, musculoskeletal complaints. No other specific complaints in a complete review of systems.   BP (!) 159/95   Pulse 91   Temp 98.3 F (36.8 C) (Oral)   Wt 129 lb 9.6 oz (58.8 kg)   LMP 03/17/2012   SpO2 99%   BMI 24.49 kg/m   Well-developed well-nourished female in no acute distress. HEENT exam atraumatic, normocephalic, extraocular muscles are intact. Poor dentition. Neck is supple. No jugular venous distention no thyromegaly. Chest with bilateral wheezing,  without increased work of breathing. Cardiac exam S1 and S2 are regular. Chest is tender to palpation anteriorally.  Abdominal  exam active bowel sounds, soft, nontender. Extremities no edema. Neurologic exam she is alert without any motor sensory deficits. Gait is normal.  Poor dentition She has poor dention and needs dental care Will refer  Chest pain Her CP is reporducible in the office to palpation. She had normal nuclear study in 2017 EKG normal Low risk.   Chronic obstructive pulmonary disease (HCC) She continues to smoke Advised abstinence and she voices understanding She knows that her SOB and COPD is caused by smoking. She declines counselling at this time.  Continue inhalers  Essential hypertension She was previously on exforge- i'll resume

## 2017-02-11 NOTE — Assessment & Plan Note (Signed)
She continues to smoke Advised abstinence and she voices understanding She knows that her SOB and COPD is caused by smoking. She declines counselling at this time.  Continue inhalers

## 2017-02-11 NOTE — Assessment & Plan Note (Signed)
She has poor dention and needs dental care Will refer

## 2017-02-11 NOTE — Assessment & Plan Note (Signed)
She was previously on exforge- i'll resume

## 2017-02-12 ENCOUNTER — Other Ambulatory Visit: Payer: Self-pay | Admitting: *Deleted

## 2017-02-12 MED ORDER — AMLODIPINE-VALSARTAN-HCTZ 5-160-12.5 MG PO TABS: 1.0000 | ORAL_TABLET | Freq: Every day | ORAL | 1 refills | Status: DC

## 2017-02-12 MED ORDER — AMLODIPINE-VALSARTAN-HCTZ 5-160-12.5 MG PO TABS
1.0000 | ORAL_TABLET | Freq: Every day | ORAL | 1 refills | Status: DC
Start: 2017-02-12 — End: 2017-02-12

## 2017-02-12 MED FILL — AMLOD-VALSA-HCTZ 5-160-12.5: 5-160-12.5 | 30 days supply | Qty: 30 | Fill #0

## 2017-02-12 NOTE — Telephone Encounter (Signed)
Pt request medication be called to pharmacy. She states [facility] pharmacy did not get prescription, when called pharmacy, they do not have this medication on file. When medication was sent, No print tab was selected. Rx was sent to the pharmacy via normal.

## 2017-02-27 ENCOUNTER — Ambulatory Visit: Payer: Self-pay | Attending: Family Medicine | Admitting: Family Medicine

## 2017-02-27 ENCOUNTER — Encounter: Payer: Self-pay | Admitting: Family Medicine

## 2017-02-27 VITALS — BP 131/76 | HR 98 | Temp 98.5°F | Resp 18 | Ht 61.0 in | Wt 128.0 lb

## 2017-02-27 DIAGNOSIS — M479 Spondylosis, unspecified: Secondary | ICD-10-CM | POA: Insufficient documentation

## 2017-02-27 DIAGNOSIS — R200 Anesthesia of skin: Secondary | ICD-10-CM | POA: Insufficient documentation

## 2017-02-27 DIAGNOSIS — J449 Chronic obstructive pulmonary disease, unspecified: Secondary | ICD-10-CM | POA: Insufficient documentation

## 2017-02-27 DIAGNOSIS — K089 Disorder of teeth and supporting structures, unspecified: Secondary | ICD-10-CM

## 2017-02-27 DIAGNOSIS — F17210 Nicotine dependence, cigarettes, uncomplicated: Secondary | ICD-10-CM | POA: Insufficient documentation

## 2017-02-27 DIAGNOSIS — E876 Hypokalemia: Secondary | ICD-10-CM | POA: Insufficient documentation

## 2017-02-27 DIAGNOSIS — E785 Hyperlipidemia, unspecified: Secondary | ICD-10-CM | POA: Insufficient documentation

## 2017-02-27 DIAGNOSIS — Z720 Tobacco use: Secondary | ICD-10-CM

## 2017-02-27 DIAGNOSIS — Z79899 Other long term (current) drug therapy: Secondary | ICD-10-CM | POA: Insufficient documentation

## 2017-02-27 DIAGNOSIS — Z9889 Other specified postprocedural states: Secondary | ICD-10-CM | POA: Insufficient documentation

## 2017-02-27 DIAGNOSIS — I10 Essential (primary) hypertension: Secondary | ICD-10-CM | POA: Insufficient documentation

## 2017-02-27 DIAGNOSIS — G8929 Other chronic pain: Secondary | ICD-10-CM | POA: Insufficient documentation

## 2017-02-27 DIAGNOSIS — D573 Sickle-cell trait: Secondary | ICD-10-CM | POA: Insufficient documentation

## 2017-02-27 DIAGNOSIS — M5416 Radiculopathy, lumbar region: Secondary | ICD-10-CM | POA: Insufficient documentation

## 2017-02-27 DIAGNOSIS — M7989 Other specified soft tissue disorders: Secondary | ICD-10-CM | POA: Insufficient documentation

## 2017-02-27 MED ORDER — PREDNISONE 20 MG PO TABS: 20.0000 mg | ORAL_TABLET | Freq: Every day | ORAL | 0 refills | Status: DC

## 2017-02-27 MED ORDER — MELOXICAM 7.5 MG PO TABS: 15.0000 mg | ORAL_TABLET | Freq: Every day | ORAL | 3 refills | Status: DC

## 2017-02-27 MED ORDER — VENTOLIN HFA 108 (90 BASE) MCG/ACT IN AERS: 2.0000 | INHALATION_SPRAY | Freq: Four times a day (QID) | RESPIRATORY_TRACT | 3 refills | Status: DC | PRN

## 2017-02-27 MED ORDER — FLUTICASONE FUROATE-VILANTEROL 100-25 MCG/INH IN AEPB: 1.0000 | INHALATION_SPRAY | Freq: Every day | RESPIRATORY_TRACT | 3 refills | Status: DC

## 2017-02-27 MED ORDER — AMLODIPINE-VALSARTAN-HCTZ 5-160-12.5 MG PO TABS: 1.0000 | ORAL_TABLET | Freq: Every day | ORAL | 3 refills | Status: DC

## 2017-02-27 MED ORDER — GABAPENTIN 300 MG PO CAPS: 300.0000 mg | ORAL_CAPSULE | Freq: Two times a day (BID) | ORAL | 3 refills | Status: DC

## 2017-02-27 MED FILL — !VENTOLIN HFA INHALER: 108 (90 BAS | 25 days supply | Qty: 18 | Fill #0

## 2017-02-27 MED FILL — !BREO ELLIPTA 100-25 MCG IN: 100-25 | 30 days supply | Qty: 60 | Fill #0

## 2017-02-27 MED FILL — predniSONE 20 MG TABS: 20 | 5 days supply | Qty: 5 | Fill #0

## 2017-02-27 MED FILL — GABAPENTIN 300 MG CAPSULE: 300 | 30 days supply | Qty: 60 | Fill #0

## 2017-02-27 MED FILL — MELOXICAM 7.5 MG TABLET: 7.5 | 15 days supply | Qty: 30 | Fill #0

## 2017-02-27 NOTE — Patient Instructions (Signed)
Chronic Obstructive Pulmonary Disease Chronic obstructive pulmonary disease (COPD) is a common lung condition in which airflow from the lungs is limited. COPD is a general term that can be used to describe many different lung problems that limit airflow, including both chronic bronchitis and emphysema. If you have COPD, your lung function will probably never return to normal, but there are measures you can take to improve lung function and make yourself feel better. What are the causes?  Smoking (common).  Exposure to secondhand smoke.  Genetic problems.  Chronic inflammatory lung diseases or recurrent infections. What are the signs or symptoms?  Shortness of breath, especially with physical activity.  Deep, persistent (chronic) cough with a large amount of thick mucus.  Wheezing.  Rapid breaths (tachypnea).  Gray or bluish discoloration (cyanosis) of the skin, especially in your fingers, toes, or lips.  Fatigue.  Weight loss.  Frequent infections or episodes when breathing symptoms become much worse (exacerbations).  Chest tightness. How is this diagnosed? Your health care provider will take a medical history and perform a physical examination to diagnose COPD. Additional tests for COPD may include:  Lung (pulmonary) function tests.  Chest X-ray.  CT scan.  Blood tests. How is this treated? Treatment for COPD may include:  Inhaler and nebulizer medicines. These help manage the symptoms of COPD and make your breathing more comfortable.  Supplemental oxygen. Supplemental oxygen is only helpful if you have a low oxygen level in your blood.  Exercise and physical activity. These are beneficial for nearly all people with COPD.  Lung surgery or transplant.  Nutrition therapy to gain weight, if you are underweight.  Pulmonary rehabilitation. This may involve working with a team of health care providers and specialists, such as respiratory, occupational, and physical  therapists. Follow these instructions at home:  Take all medicines (inhaled or pills) as directed by your health care provider.  Avoid over-the-counter medicines or cough syrups that dry up your airway (such as antihistamines) and slow down the elimination of secretions unless instructed otherwise by your health care provider.  If you are a smoker, the most important thing that you can do is stop smoking. Continuing to smoke will cause further lung damage and breathing trouble. Ask your health care provider for help with quitting smoking. He or she can direct you to community resources or hospitals that provide support.  Avoid exposure to irritants such as smoke, chemicals, and fumes that aggravate your breathing.  Use oxygen therapy and pulmonary rehabilitation if directed by your health care provider. If you require home oxygen therapy, ask your health care provider whether you should purchase a pulse oximeter to measure your oxygen level at home.  Avoid contact with individuals who have a contagious illness.  Avoid extreme temperature and humidity changes.  Eat healthy foods. Eating smaller, more frequent meals and resting before meals may help you maintain your strength.  Stay active, but balance activity with periods of rest. Exercise and physical activity will help you maintain your ability to do things you want to do.  Preventing infection and hospitalization is very important when you have COPD. Make sure to receive all the vaccines your health care provider recommends, especially the pneumococcal and influenza vaccines. Ask your health care provider whether you need a pneumonia vaccine.  Learn and use relaxation techniques to manage stress.  Learn and use controlled breathing techniques as directed by your health care provider. Controlled breathing techniques include: 1. Pursed lip breathing. Start by breathing in (inhaling)   through your nose for 1 second. Then, purse your lips as  if you were going to whistle and breathe out (exhale) through the pursed lips for 2 seconds. 2. Diaphragmatic breathing. Start by putting one hand on your abdomen just above your waist. Inhale slowly through your nose. The hand on your abdomen should move out. Then purse your lips and exhale slowly. You should be able to feel the hand on your abdomen moving in as you exhale.  Learn and use controlled coughing to clear mucus from your lungs. Controlled coughing is a series of short, progressive coughs. The steps of controlled coughing are: 1. Lean your head slightly forward. 2. Breathe in deeply using diaphragmatic breathing. 3. Try to hold your breath for 3 seconds. 4. Keep your mouth slightly open while coughing twice. 5. Spit any mucus out into a tissue. 6. Rest and repeat the steps once or twice as needed. Contact a health care provider if:  You are coughing up more mucus than usual.  There is a change in the color or thickness of your mucus.  Your breathing is more labored than usual.  Your breathing is faster than usual. Get help right away if:  You have shortness of breath while you are resting.  You have shortness of breath that prevents you from:  Being able to talk.  Performing your usual physical activities.  You have chest pain lasting longer than 5 minutes.  Your skin color is more cyanotic than usual.  You measure low oxygen saturations for longer than 5 minutes with a pulse oximeter. This information is not intended to replace advice given to you by your health care provider. Make sure you discuss any questions you have with your health care provider. Document Released: 07/09/2005 Document Revised: 03/06/2016 Document Reviewed: 05/26/2013 Elsevier Interactive Patient Education  2017 [employer]  

## 2017-02-27 NOTE — Progress Notes (Signed)
Patient is here for COPD  Patient complains of bilateral leg pain being present. Pain is scaled currently at a 9.  Patient has taken medication today. Patient has eaten today.

## 2017-02-27 NOTE — Progress Notes (Signed)
Subjective:  Patient ID: Tammy Schwartz, female    DOB: Dec 28, 1955  Age: 61 y.o. MRN: [id number]  CC: COPD   HPI Tammy Schwartz is a 61 year old female with history of hypertension, tobacco abuse, COPD, lumbar spondylosis status post back surgery who presents today for follow-up visit.  She has been compliant with all her medications as well as a low-sodium diet and her blood pressure has improved compared to last visit. She continues to smoke 1 cigarette per day and is working on quitting.  She complains of low back pain that radiates to her legs with pain predominantly in her legs and and are described as throbbing ever since she had had back surgery in 01/2014. This is associated with numbness in feet and occasional swelling with symptoms worse at night. Denies loss of sphincteric function, recent falls.  Request referral to a dentist for multiple teeth extraction.  Past Medical History:  Diagnosis Date  . Chronic back pain    HNp,spondylosis,radiculopathy  . COPD (chronic obstructive pulmonary disease) (HCC)   . Hyperlipidemia    was on choloesterol meds last yr-samples given in office but not needed since  . Hypertension    takes Exforge daily  . Joint pain   . Pneumonia    hx of;many yrs ago  . Shortness of breath    takes Singulair daily;with exertion  . Sickle cell trait (HCC)   . Spondylolysis   . Tobacco use     Past Surgical History:  Procedure Laterality Date  . BACK SURGERY    . BREAST SURGERY Left    lumpectomy  . COLONOSCOPY    . DILATION AND CURETTAGE OF UTERUS    . LUMBAR LAMINECTOMY/DECOMPRESSION MICRODISCECTOMY  06/04/2012   Procedure: LUMBAR LAMINECTOMY/DECOMPRESSION MICRODISCECTOMY 1 LEVEL;  Surgeon: Carmela Hurt, MD;  Location: [facility];  Service: Neurosurgery;  Laterality: Left;  LEFT Lumbar five-sacral one diskectomy  . LUMBAR LAMINECTOMY/DECOMPRESSION MICRODISCECTOMY Right 06/16/2013   Procedure: RIGHT Lumbar Five-Sacral One  Microdiskectomy;  Surgeon: Carmela Hurt, MD;  Location: [facility];  Service: Neurosurgery;  Laterality: Right;  RIGHT Lumbar Five-Sacral One Microdiskectomy  . LUMBAR LAMINECTOMY/DECOMPRESSION MICRODISCECTOMY Right 01/20/2014   Procedure: LUMBAR FOUR TO LUMBAR FIVE LUMBAR LAMINECTOMY/DECOMPRESSION MICRODISCECTOMY 1 LEVEL;  Surgeon: Carmela Hurt, MD;  Location: [facility];  Service: Neurosurgery;  Laterality: Right;  Right L45 laminectomy and foramintomy    No Known Allergies   Outpatient Medications Prior to Visit  Medication Sig Dispense Refill  . ipratropium (ATROVENT) 0.06 % nasal spray Place 2 sprays into both nostrils 4 (four) times daily. 15 mL 1  . montelukast (SINGULAIR) 10 MG tablet Take 10 mg by mouth daily.     . Multiple Vitamin (MULTIVITAMIN WITH MINERALS) TABS Take 1 tablet by mouth daily.    . Amlodipine-Valsartan-HCTZ (EXFORGE HCT) 5-160-12.5 MG TABS Take 1 tablet by mouth daily. Resume in 1 week after you have seen PCP or if SBP > 140 30 tablet 1  . fluticasone furoate-vilanterol (BREO ELLIPTA) 100-25 MCG/INH AEPB Inhale 1 puff into the lungs daily.    . meloxicam (MOBIC) 15 MG tablet Take 1 tablet (15 mg total) by mouth daily. 30 tablet 0  . VENTOLIN HFA 108 (90 Base) MCG/ACT inhaler Inhale 2 puffs into the lungs every 4 (four) hours as needed. Use 3 times daily x 4 days, then every 4 hours as needed. 1 Inhaler 3  . azithromycin (ZITHROMAX) 250 MG tablet Take 1 tablet (250 mg total) by  mouth daily. Take one tablet daily 4 tablet 0  . predniSONE (DELTASONE) 20 MG tablet Take 2 tablets (40 mg total) by mouth daily. 8 tablet 0   No facility-administered medications prior to visit.     ROS Review of Systems  Constitutional: Negative for activity change, appetite change and fatigue.  HENT: Positive for dental problem. Negative for congestion, sinus pressure and sore throat.   Eyes: Negative for visual disturbance.  Respiratory: Negative for cough, chest tightness,  shortness of breath and wheezing.   Cardiovascular: Negative for chest pain and palpitations.  Gastrointestinal: Negative for abdominal distention, abdominal pain and constipation.  Endocrine: Negative for polydipsia.  Genitourinary: Negative for dysuria and frequency.  Musculoskeletal: Positive for back pain. Negative for arthralgias.  Skin: Negative for rash.  Neurological: Positive for numbness. Negative for tremors and light-headedness.  Hematological: Does not bruise/bleed easily.  Psychiatric/Behavioral: Negative for agitation and behavioral problems.    Objective:  BP 131/76 (BP Location: Right Arm, Patient Position: Sitting, Cuff Size: Normal)   Pulse 98   Temp 98.5 F (36.9 C) (Oral)   Resp 18   Ht 5\' 1"  (1.549 m)   Wt 128 lb (58.1 kg)   LMP 03/17/2012   SpO2 100%   BMI 24.19 kg/m   BP/Weight 02/27/2017 02/11/2017 02/07/2017  Systolic BP 131 159 154  Diastolic BP 76 95 74  Wt. (Lbs) 128 129.6 -  BMI 24.19 24.49 -      Physical Exam  Constitutional: She is oriented to person, place, and time. She appears well-developed and well-nourished.  Cardiovascular: Normal rate, normal heart sounds and intact distal pulses.   No murmur heard. Pulmonary/Chest: Effort normal. She has wheezes. She has no rales. She exhibits no tenderness.  Abdominal: Soft. Bowel sounds are normal. She exhibits no distension and no mass. There is no tenderness.  Musculoskeletal: Normal range of motion.  Neurological: She is alert and oriented to person, place, and time.  Skin: Skin is warm and dry.  Psychiatric: She has a normal mood and affect.     Assessment & Plan:   1. Essential hypertension Controlled Low-sodium diet - Amlodipine-Valsartan-HCTZ (EXFORGE HCT) 5-160-12.5 MG TABS; Take 1 tablet by mouth daily.  Dispense: 30 tablet; Refill: 3  2. Chronic obstructive pulmonary disease, unspecified COPD type (HCC) Short course of oral prednisone as she is currently wheezing Encouraged to  quit smoking as this will retard progression of COPD - fluticasone furoate-vilanterol (BREO ELLIPTA) 100-25 MCG/INH AEPB; Inhale 1 puff into the lungs daily.  Dispense: 1 each; Refill: 3 - VENTOLIN HFA 108 (90 Base) MCG/ACT inhaler; Inhale 2 puffs into the lungs every 6 (six) hours as needed.  Dispense: 1 Inhaler; Refill: 3 - predniSONE (DELTASONE) 20 MG tablet; Take 1 tablet (20 mg total) by mouth daily with breakfast.  Dispense: 5 tablet; Refill: 0  3. Poor dentition - Ambulatory referral to Dentistry  4. Lumbar radiculopathy No red flags - meloxicam (MOBIC) 7.5 MG tablet; Take 2 tablets (15 mg total) by mouth daily.  Dispense: 30 tablet; Refill: 3 - gabapentin (NEURONTIN) 300 MG capsule; Take 1 capsule (300 mg total) by mouth 2 (two) times daily.  Dispense: 60 capsule; Refill: 3  5. Hypokalemia - Basic Metabolic Panel  6. Tobacco use Spent 3 minutes counseling on cessation and she is working on quitting.   Meds ordered this encounter  Medications  . meloxicam (MOBIC) 7.5 MG tablet    Sig: Take 2 tablets (15 mg total) by mouth daily.  Dispense:  30 tablet    Refill:  3  . fluticasone furoate-vilanterol (BREO ELLIPTA) 100-25 MCG/INH AEPB    Sig: Inhale 1 puff into the lungs daily.    Dispense:  1 each    Refill:  3  . VENTOLIN HFA 108 (90 Base) MCG/ACT inhaler    Sig: Inhale 2 puffs into the lungs every 6 (six) hours as needed.    Dispense:  1 Inhaler    Refill:  3  . Amlodipine-Valsartan-HCTZ (EXFORGE HCT) 5-160-12.5 MG TABS    Sig: Take 1 tablet by mouth daily.    Dispense:  30 tablet    Refill:  3  . gabapentin (NEURONTIN) 300 MG capsule    Sig: Take 1 capsule (300 mg total) by mouth 2 (two) times daily.    Dispense:  60 capsule    Refill:  3  . predniSONE (DELTASONE) 20 MG tablet    Sig: Take 1 tablet (20 mg total) by mouth daily with breakfast.    Dispense:  5 tablet    Refill:  0    Follow-up: Return in about 3 months (around 05/30/2017) for follow up of  chronic medical conditions.   Jaclyn Shaggy MD

## 2017-02-28 LAB — BASIC METABOLIC PANEL
BUN / CREAT RATIO: 10 — AB (ref 12–28)
BUN: 11 mg/dL (ref 8–27)
CHLORIDE: 99 mmol/L (ref 96–106)
CO2: 26 mmol/L (ref 18–29)
Calcium: 10 mg/dL (ref 8.7–10.3)
Creatinine, Ser: 1.11 mg/dL — ABNORMAL HIGH (ref 0.57–1.00)
GFR calc Af Amer: 62 mL/min/{1.73_m2} (ref >59)
GFR calc non Af Amer: 54 mL/min/{1.73_m2} — ABNORMAL LOW (ref >59)
GLUCOSE: 104 mg/dL — AB (ref 65–99)
Potassium: 3.9 mmol/L (ref 3.5–5.2)
SODIUM: 144 mmol/L (ref 134–144)

## 2017-03-13 MED FILL — AMLOD-VALSA-HCTZ 5-160-12.5: 5-160-12.5 | 30 days supply | Qty: 30 | Fill #1

## 2017-03-17 ENCOUNTER — Telehealth: Payer: Self-pay | Admitting: Family Medicine

## 2017-03-17 ENCOUNTER — Telehealth: Payer: Self-pay | Admitting: *Deleted

## 2017-03-17 NOTE — Telephone Encounter (Signed)
Pt. Returned nurse call. She was informed of labs being stable.

## 2017-03-17 NOTE — Telephone Encounter (Signed)
-----   Message from Jaclyn ShaggyEnobong Amao, MD sent at 03/02/2017  4:00 PM EDT ----- Blood work is stable

## 2017-03-17 NOTE — Telephone Encounter (Signed)
MA left a message with "person" to ask patient to [facility]

## 2017-04-01 MED FILL — MELOXICAM 7.5 MG TABLET: 7.5 | 15 days supply | Qty: 30 | Fill #1

## 2017-04-01 MED FILL — GABAPENTIN 300 MG CAPSULE: 300 | 30 days supply | Qty: 60 | Fill #1

## 2017-04-20 ENCOUNTER — Ambulatory Visit: Payer: Self-pay | Attending: Family Medicine

## 2017-04-24 MED FILL — AMLOD-VALSA-HCTZ 5-160-12.5: 5-160-12.5 | 30 days supply | Qty: 30 | Fill #0

## 2017-04-27 MED FILL — GABAPENTIN 300 MG CAPSULE: 300 | 30 days supply | Qty: 60 | Fill #2

## 2017-04-27 MED FILL — MELOXICAM 7.5 MG TABLET: 7.5 | 15 days supply | Qty: 30 | Fill #2

## 2017-05-18 ENCOUNTER — Other Ambulatory Visit: Payer: Self-pay | Admitting: Family Medicine

## 2017-05-18 DIAGNOSIS — M5416 Radiculopathy, lumbar region: Secondary | ICD-10-CM

## 2017-05-18 MED FILL — MELOXICAM 7.5 MG TABLET: 7.5 | 15 days supply | Qty: 30 | Fill #3

## 2017-05-18 MED FILL — !VENTOLIN HFA INHALER: 108 (90 BAS | 25 days supply | Qty: 18 | Fill #1

## 2017-05-18 MED FILL — !BREO ELLIPTA 100-25 MCG IN: 100-25 | 30 days supply | Qty: 60 | Fill #1

## 2017-05-20 ENCOUNTER — Ambulatory Visit: Payer: Self-pay | Attending: Family Medicine

## 2017-05-20 MED FILL — GABAPENTIN 300 MG CAPSULE: 300 | 30 days supply | Qty: 60 | Fill #3

## 2017-05-20 MED FILL — AMLOD-VALSA-HCTZ 5-160-12.5: 5-160-12.5 | 30 days supply | Qty: 30 | Fill #1

## 2017-06-01 ENCOUNTER — Ambulatory Visit: Payer: Self-pay | Admitting: Family Medicine

## 2017-06-04 ENCOUNTER — Ambulatory Visit: Payer: Self-pay | Attending: Family Medicine

## 2017-06-04 ENCOUNTER — Ambulatory Visit: Payer: Self-pay | Admitting: Family Medicine

## 2017-06-08 ENCOUNTER — Other Ambulatory Visit: Payer: Self-pay

## 2017-06-08 ENCOUNTER — Ambulatory Visit: Payer: Self-pay | Admitting: Family Medicine

## 2017-06-08 DIAGNOSIS — J449 Chronic obstructive pulmonary disease, unspecified: Secondary | ICD-10-CM

## 2017-06-08 MED ORDER — VENTOLIN HFA 108 (90 BASE) MCG/ACT IN AERS: 2.0000 | INHALATION_SPRAY | Freq: Four times a day (QID) | RESPIRATORY_TRACT | 3 refills | Status: DC | PRN

## 2017-06-08 MED ORDER — FLUTICASONE FUROATE-VILANTEROL 100-25 MCG/INH IN AEPB: 1.0000 | INHALATION_SPRAY | Freq: Every day | RESPIRATORY_TRACT | 3 refills | Status: DC

## 2017-06-09 ENCOUNTER — Encounter: Payer: Self-pay | Admitting: Family Medicine

## 2017-06-09 ENCOUNTER — Ambulatory Visit: Payer: Self-pay | Attending: Family Medicine | Admitting: Family Medicine

## 2017-06-09 VITALS — BP 119/72 | HR 82 | Temp 98.1°F | Ht 61.0 in | Wt 131.4 lb

## 2017-06-09 DIAGNOSIS — G8929 Other chronic pain: Secondary | ICD-10-CM | POA: Insufficient documentation

## 2017-06-09 DIAGNOSIS — Z1159 Encounter for screening for other viral diseases: Secondary | ICD-10-CM

## 2017-06-09 DIAGNOSIS — J449 Chronic obstructive pulmonary disease, unspecified: Secondary | ICD-10-CM | POA: Insufficient documentation

## 2017-06-09 DIAGNOSIS — M5416 Radiculopathy, lumbar region: Secondary | ICD-10-CM

## 2017-06-09 DIAGNOSIS — K649 Unspecified hemorrhoids: Secondary | ICD-10-CM | POA: Insufficient documentation

## 2017-06-09 DIAGNOSIS — D573 Sickle-cell trait: Secondary | ICD-10-CM | POA: Insufficient documentation

## 2017-06-09 DIAGNOSIS — M5116 Intervertebral disc disorders with radiculopathy, lumbar region: Secondary | ICD-10-CM | POA: Insufficient documentation

## 2017-06-09 DIAGNOSIS — M5126 Other intervertebral disc displacement, lumbar region: Secondary | ICD-10-CM | POA: Insufficient documentation

## 2017-06-09 DIAGNOSIS — I10 Essential (primary) hypertension: Secondary | ICD-10-CM | POA: Insufficient documentation

## 2017-06-09 DIAGNOSIS — F17210 Nicotine dependence, cigarettes, uncomplicated: Secondary | ICD-10-CM | POA: Insufficient documentation

## 2017-06-09 DIAGNOSIS — K5909 Other constipation: Secondary | ICD-10-CM

## 2017-06-09 DIAGNOSIS — K648 Other hemorrhoids: Secondary | ICD-10-CM

## 2017-06-09 MED ORDER — POLYETHYLENE GLYCOL 3350 17 GM/SCOOP PO POWD: 17.0000 g | Freq: Every day | ORAL | 1 refills | Status: AC

## 2017-06-09 MED ORDER — AMLODIPINE-VALSARTAN-HCTZ 5-160-12.5 MG PO TABS: 1.0000 | ORAL_TABLET | Freq: Every day | ORAL | 3 refills | Status: DC

## 2017-06-09 MED ORDER — GABAPENTIN 300 MG PO CAPS: 600.0000 mg | ORAL_CAPSULE | Freq: Two times a day (BID) | ORAL | 3 refills | Status: DC

## 2017-06-09 MED ORDER — MELOXICAM 7.5 MG PO TABS: 15.0000 mg | ORAL_TABLET | Freq: Every day | ORAL | 3 refills | Status: DC

## 2017-06-09 MED ORDER — HYDROCORTISONE ACE-PRAMOXINE 1-1 % RE CREA: 1.0000 "application " | TOPICAL_CREAM | Freq: Two times a day (BID) | RECTAL | 0 refills | Status: DC

## 2017-06-09 MED FILL — HYDROCORT-PRAMOXINE 1%-1% C: 1-1 | 30 days supply | Qty: 30 | Fill #0

## 2017-06-09 MED FILL — MELOXICAM 7.5 MG TABLET: 7.5 | 15 days supply | Qty: 30 | Fill #0

## 2017-06-09 MED FILL — AMLOD-VALSA-HCTZ 5-160-12.5: 5-160-12.5 | 30 days supply | Qty: 30 | Fill #0

## 2017-06-09 MED FILL — GABAPENTIN 300 MG CAPSULE: 300 | 30 days supply | Qty: 120 | Fill #0

## 2017-06-09 MED FILL — POLYETHYLENE GLYCOL 3350: 15 days supply | Qty: 255 | Fill #0

## 2017-06-09 NOTE — Progress Notes (Signed)
Subjective:  Patient ID: Tammy Schwartz, female    DOB: 1955-12-22  Age: 61 y.o. MRN: [id number]  CC: Hypertension   HPI Tammy Schwartz  is a 61 year old female with history of hypertension, tobacco abuse, COPD, lumbar spondylosis status post back surgery who presents today for follow-up visit.  She has been compliant with all her medications as well as a low-sodium diet and her blood pressure is controlled She continues to smoke 1 cigarette per day and is working on quitting.  She complains of low back pain that radiates to her legs with pain predominantly in her legs and and described as throbbing ever since she had had back surgery in 01/2014. This is associated with numbness in feet and she complains that her legs sometimes give out on her. Use of a back brace does not alleviate symptoms Denies loss of sphincteric function or recent falls. Lumbar spine x-ray from 04/2016 revealed dextroscoliosis with moderate to advanced degenerative spondylosis in L4-5 and L5-S1  She also complains of feeling a lump in her rectum while moving her bowels and associated blood in her stool. Her last colonoscopy was last year and was normal rate. Repeat recommended in 10 years as per the patient.  Past Medical History:  Diagnosis Date  . Chronic back pain    HNp,spondylosis,radiculopathy  . COPD (chronic obstructive pulmonary disease) (HCC)   . Hyperlipidemia    was on choloesterol meds last yr-samples given in office but not needed since  . Hypertension    takes Exforge daily  . Joint pain   . Pneumonia    hx of;many yrs ago  . Shortness of breath    takes Singulair daily;with exertion  . Sickle cell trait (HCC)   . Spondylolysis   . Tobacco use     Past Surgical History:  Procedure Laterality Date  . BACK SURGERY    . BREAST SURGERY Left    lumpectomy  . COLONOSCOPY    . DILATION AND CURETTAGE OF UTERUS    . LUMBAR LAMINECTOMY/DECOMPRESSION MICRODISCECTOMY  06/04/2012   Procedure: LUMBAR LAMINECTOMY/DECOMPRESSION MICRODISCECTOMY 1 LEVEL;  Surgeon: Carmela Hurt, MD;  Location: [facility];  Service: Neurosurgery;  Laterality: Left;  LEFT Lumbar five-sacral one diskectomy  . LUMBAR LAMINECTOMY/DECOMPRESSION MICRODISCECTOMY Right 06/16/2013   Procedure: RIGHT Lumbar Five-Sacral One Microdiskectomy;  Surgeon: Carmela Hurt, MD;  Location: [facility];  Service: Neurosurgery;  Laterality: Right;  RIGHT Lumbar Five-Sacral One Microdiskectomy  . LUMBAR LAMINECTOMY/DECOMPRESSION MICRODISCECTOMY Right 01/20/2014   Procedure: LUMBAR FOUR TO LUMBAR FIVE LUMBAR LAMINECTOMY/DECOMPRESSION MICRODISCECTOMY 1 LEVEL;  Surgeon: Carmela Hurt, MD;  Location: [facility];  Service: Neurosurgery;  Laterality: Right;  Right L45 laminectomy and foramintomy    No Known Allergies   Outpatient Medications Prior to Visit  Medication Sig Dispense Refill  . fluticasone furoate-vilanterol (BREO ELLIPTA) 100-25 MCG/INH AEPB Inhale 1 puff into the lungs daily. 90 each 3  . Multiple Vitamin (MULTIVITAMIN WITH MINERALS) TABS Take 1 tablet by mouth daily.    . VENTOLIN HFA 108 (90 Base) MCG/ACT inhaler Inhale 2 puffs into the lungs every 6 (six) hours as needed. 54 g 3  . Amlodipine-Valsartan-HCTZ (EXFORGE HCT) 5-160-12.5 MG TABS Take 1 tablet by mouth daily. 30 tablet 3  . gabapentin (NEURONTIN) 300 MG capsule Take 1 capsule (300 mg total) by mouth 2 (two) times daily. 60 capsule 3  . meloxicam (MOBIC) 7.5 MG tablet Take 2 tablets (15 mg total) by mouth daily. 30 tablet 3  .  ipratropium (ATROVENT) 0.06 % nasal spray Place 2 sprays into both nostrils 4 (four) times daily. (Patient not taking: Reported on 06/09/2017) 15 mL 1  . montelukast (SINGULAIR) 10 MG tablet Take 10 mg by mouth daily.     . predniSONE (DELTASONE) 20 MG tablet Take 1 tablet (20 mg total) by mouth daily with breakfast. 5 tablet 0   No facility-administered medications prior to visit.     ROS Review of Systems    Constitutional: Negative for activity change, appetite change and fatigue.  HENT: Negative for congestion, sinus pressure and sore throat.   Eyes: Negative for visual disturbance.  Respiratory: Negative for cough, chest tightness, shortness of breath and wheezing.   Cardiovascular: Negative for chest pain and palpitations.  Gastrointestinal: Positive for blood in stool. Negative for abdominal distention, abdominal pain and constipation.  Endocrine: Negative for polydipsia.  Genitourinary: Negative for dysuria and frequency.  Musculoskeletal: Positive for back pain. Negative for arthralgias.  Skin: Negative for rash.  Neurological: Negative for tremors, light-headedness and numbness.  Hematological: Does not bruise/bleed easily.  Psychiatric/Behavioral: Negative for agitation and behavioral problems.    Objective:  BP 119/72   Pulse 82   Temp 98.1 F (36.7 C) (Oral)   Ht 5\' 1"  (1.549 m)   Wt 131 lb 6.4 oz (59.6 kg)   LMP 03/17/2012   SpO2 97%   BMI 24.83 kg/m   BP/Weight 06/09/2017 02/27/2017 02/11/2017  Systolic BP 119 131 159  Diastolic BP 72 76 95  Wt. (Lbs) 131.4 128 129.6  BMI 24.83 24.19 24.49      Physical Exam  Constitutional: She is oriented to person, place, and time. She appears well-developed and well-nourished.  Cardiovascular: Normal rate, normal heart sounds and intact distal pulses.   No murmur heard. Pulmonary/Chest: Effort normal and breath sounds normal. She has no wheezes. She has no rales. She exhibits no tenderness.  Abdominal: Soft. Bowel sounds are normal. She exhibits no distension and no mass. There is no tenderness.  Genitourinary: Rectal exam shows guaiac positive stool.  Genitourinary Comments: Hemorrhoid at 6 o'clock  Musculoskeletal: Normal range of motion. She exhibits tenderness (Tenderness to palpation of lumbar spine).  Healed vertical lumbar surgical scar  Neurological: She is alert and oriented to person, place, and time.  Skin: Skin is  warm and dry.  Psychiatric: She has a normal mood and affect.      CMP Latest Ref Rng & Units 02/27/2017 02/07/2017 05/08/2016  Glucose 65 - 99 mg/dL 161(W) 92 960(A)  BUN 8 - 27 mg/dL 11 10 8   Creatinine 0.57 - 1.00 mg/dL 5.40(J) 8.11 9.14  Sodium 134 - 144 mmol/L 144 143 138  Potassium 3.5 - 5.2 mmol/L 3.9 3.1(L) 3.8  Chloride 96 - 106 mmol/L 99 109 110  CO2 18 - 29 mmol/L 26 23 21(L)  Calcium 8.7 - 10.3 mg/dL 78.2 9.7 9.5(A)    CLINICAL DATA:  Initial evaluation for acute trauma, fall 10 days ago. Low back pain. EXAM: LUMBAR SPINE - COMPLETE 4+ VIEW COMPARISON:  Prior radiograph from 01/20/2014. FINDINGS: Dextroscoliosis. Vertebral bodies otherwise normally aligned with preservation of the normal lumbar lordosis. Vertebral body heights preserved. No acute fracture subluxation. Visualized sacrum intact. Degenerative spondylolysis present at L4-5 and L5-S1 with prominent reactive endplate sclerosis at L5-S1. Bilateral facet arthrosis present these levels as well. No acute soft tissue abnormality. Secrete did IV contrast material within the right urinary collecting system and bladder. IMPRESSION: 1. No radiographic evidence for acute traumatic injury within  the lumbar spine. 2. Dextroscoliosis with moderate to advanced degenerative spondylolysis at L4-5 and L5-S1. Electronically Signed   By: Rise Mu M.D.   On: 05/07/2016 05:41  Assessment & Plan:   1. Essential hypertension Controlled Low-sodium diet - Amlodipine-Valsartan-HCTZ (EXFORGE HCT) 5-160-12.5 MG TABS; Take 1 tablet by mouth daily.  Dispense: 30 tablet; Refill: 3  2. Lumbar radiculopathy Uncontrolled Increase dose of gabapentin Apply heat, use back breeze - gabapentin (NEURONTIN) 300 MG capsule; Take 2 capsules (600 mg total) by mouth 2 (two) times daily.  Dispense: 120 capsule; Refill: 3 - meloxicam (MOBIC) 7.5 MG tablet; Take 2 tablets (15 mg total) by mouth daily.  Dispense: 30 tablet; Refill:  3  3. Other constipation Advised to increase fiber intake - polyethylene glycol powder (GLYCOLAX/MIRALAX) powder; Take 17 g by mouth daily.  Dispense: 3350 g; Refill: 1  4. Other hemorrhoids Discussed measures to prevent constipation - CBC with Differential - pramoxine-hydrocortisone (ANALPRAM-HC) 1-1 % rectal cream; Place 1 application rectally 2 (two) times daily.  Dispense: 30 g; Refill: 0  5. Screening for viral disease - Hepatitis c antibody (reflex) - HIV antibody (with reflex)   Meds ordered this encounter  Medications  . polyethylene glycol powder (GLYCOLAX/MIRALAX) powder    Sig: Take 17 g by mouth daily.    Dispense:  3350 g    Refill:  1  . Amlodipine-Valsartan-HCTZ (EXFORGE HCT) 5-160-12.5 MG TABS    Sig: Take 1 tablet by mouth daily.    Dispense:  30 tablet    Refill:  3  . gabapentin (NEURONTIN) 300 MG capsule    Sig: Take 2 capsules (600 mg total) by mouth 2 (two) times daily.    Dispense:  120 capsule    Refill:  3  . meloxicam (MOBIC) 7.5 MG tablet    Sig: Take 2 tablets (15 mg total) by mouth daily.    Dispense:  30 tablet    Refill:  3  . pramoxine-hydrocortisone (ANALPRAM-HC) 1-1 % rectal cream    Sig: Place 1 application rectally 2 (two) times daily.    Dispense:  30 g    Refill:  0    Follow-up: Return in about 1 month (around 07/10/2017) for Complete physical exam.   Jaclyn Shaggy MD

## 2017-06-09 NOTE — Patient Instructions (Signed)

## 2017-06-10 ENCOUNTER — Telehealth: Payer: Self-pay

## 2017-06-10 LAB — CBC WITH DIFFERENTIAL/PLATELET
BASOS ABS: 0 10*3/uL (ref 0.0–0.2)
Basos: 0 %
EOS (ABSOLUTE): 0.2 10*3/uL (ref 0.0–0.4)
Eos: 3 %
HEMOGLOBIN: 12.7 g/dL (ref 11.1–15.9)
Hematocrit: 38.4 % (ref 34.0–46.6)
IMMATURE GRANS (ABS): 0 10*3/uL (ref 0.0–0.1)
IMMATURE GRANULOCYTES: 1 %
LYMPHS: 37 %
Lymphocytes Absolute: 2.4 10*3/uL (ref 0.7–3.1)
MCH: 28.9 pg (ref 26.6–33.0)
MCHC: 33.1 g/dL (ref 31.5–35.7)
MCV: 87 fL (ref 79–97)
MONOCYTES: 9 %
Monocytes Absolute: 0.6 10*3/uL (ref 0.1–0.9)
NEUTROS PCT: 50 %
Neutrophils Absolute: 3.4 10*3/uL (ref 1.4–7.0)
PLATELETS: 254 10*3/uL (ref 150–379)
RBC: 4.4 x10E6/uL (ref 3.77–5.28)
RDW: 15.8 % — ABNORMAL HIGH (ref 12.3–15.4)
WBC: 6.7 10*3/uL (ref 3.4–10.8)

## 2017-06-10 LAB — HIV ANTIBODY (ROUTINE TESTING W REFLEX): HIV SCREEN 4TH GENERATION: NONREACTIVE

## 2017-06-10 LAB — HEPATITIS C ANTIBODY (REFLEX): HCV AB: 0.1 {s_co_ratio} (ref 0.0–0.9)

## 2017-06-10 LAB — HCV COMMENT:

## 2017-06-10 NOTE — Telephone Encounter (Signed)
Pt was called and a vm was left informing pt to return phone call for lab results. 

## 2017-06-10 NOTE — Telephone Encounter (Signed)
Pt returned phone call and was informed of lab results. 

## 2017-06-19 ENCOUNTER — Telehealth: Payer: Self-pay | Admitting: Family Medicine

## 2017-06-19 NOTE — Telephone Encounter (Signed)
Patient called states pramoxine-hydrocortisone [facility](ANALPRAM-HC) 1-1 % rectal cream  has not been working for her, she is still experiencing  rectal bleeding . Please f/up

## 2017-06-22 MED FILL — !VENTOLIN HFA INHALER: 108 (90 BAS | 25 days supply | Qty: 18 | Fill #2

## 2017-06-22 MED FILL — !BREO ELLIPTA 100-25 MCG IN: 100-25 | 30 days supply | Qty: 60 | Fill #2

## 2017-06-22 NOTE — Telephone Encounter (Signed)
Will route to PCP 

## 2017-06-23 ENCOUNTER — Other Ambulatory Visit: Payer: Self-pay | Admitting: Family Medicine

## 2017-06-23 DIAGNOSIS — K648 Other hemorrhoids: Secondary | ICD-10-CM

## 2017-06-23 MED ORDER — LACTULOSE 10 GM/15ML PO SOLN: 10.0000 g | Freq: Two times a day (BID) | ORAL | 0 refills | Status: DC | PRN

## 2017-06-23 NOTE — Telephone Encounter (Signed)
Pt was called and informed of script being sent to onsite pharmacy.

## 2017-06-23 NOTE — Telephone Encounter (Signed)
I have sent a rx for Lactulose to her pharmacy which should prevent constipation and in turn the rectal bleed from hemorrhoids.

## 2017-06-25 ENCOUNTER — Telehealth: Payer: Self-pay | Admitting: Family Medicine

## 2017-06-25 ENCOUNTER — Other Ambulatory Visit: Payer: Self-pay | Admitting: Family Medicine

## 2017-06-25 DIAGNOSIS — K648 Other hemorrhoids: Secondary | ICD-10-CM

## 2017-06-25 MED ORDER — HYDROCORTISONE ACE-PRAMOXINE 1-1 % RE CREA: 1.0000 "application " | TOPICAL_CREAM | Freq: Two times a day (BID) | RECTAL | 0 refills | Status: DC

## 2017-06-25 NOTE — Telephone Encounter (Signed)
Pt called to request a refill of the cream  pramoxine-hydrocortisone (PROCTOCREAM-HC) 1-1 % rectal cream   [facility] and call her back

## 2017-06-25 NOTE — Telephone Encounter (Signed)
This was refilled yesterday. Will resend in case pharmacy did not receive.

## 2017-07-02 ENCOUNTER — Encounter (HOSPITAL_COMMUNITY): Payer: Self-pay | Admitting: Emergency Medicine

## 2017-07-02 ENCOUNTER — Ambulatory Visit (HOSPITAL_COMMUNITY)
Admission: EM | Admit: 2017-07-02 | Discharge: 2017-07-02 | Disposition: A | Discharge status: Home / Self Care | Payer: Self-pay | Attending: Nurse Practitioner | Admitting: Nurse Practitioner

## 2017-07-02 DIAGNOSIS — J069 Acute upper respiratory infection, unspecified: Secondary | ICD-10-CM

## 2017-07-02 DIAGNOSIS — K649 Unspecified hemorrhoids: Secondary | ICD-10-CM

## 2017-07-02 DIAGNOSIS — R238 Other skin changes: Secondary | ICD-10-CM

## 2017-07-02 MED ORDER — FLUTICASONE PROPIONATE 50 MCG/ACT NA SUSP: 1.0000 | Freq: Every day | NASAL | 0 refills | Status: DC

## 2017-07-02 MED ORDER — FEXOFENADINE-PSEUDOEPHED ER 60-120 MG PO TB12: 1.0000 | ORAL_TABLET | Freq: Two times a day (BID) | ORAL | 0 refills | Status: DC

## 2017-07-02 NOTE — ED Triage Notes (Signed)
PT reports cough / URI for 2 days.   PT reports hemorrhoids as well. PT was prescribed a cream for these 2 weeks ago, but is still having issues  PT also reports sensation of something crawling on her skin

## 2017-07-02 NOTE — ED Provider Notes (Signed)
[facility]    CSN: [id number] Arrival date & time: 07/02/17  1741     History   Chief Complaint Chief Complaint  Patient presents with  . URI  . Hemorrhoids    HPI Tammy Schwartz is a 61 y.o. female.   Subjective:   History was provided by the patient.  Tammy Schwartz is a 61 y.o. female who presents with multiple complaints.   Patient reports a 2 day history of sinus and nasal congestion. Course has been stable and unchanged since that time. She denies any facial pain, lightheadedness, fever, nasal congestion, nausea, vomiting, rash, chest pain, shortness of breath or sore throat.  She is drinking plenty of fluids. Evaluation to date: none. Treatment to date: none  She also reports a history of hemorrhoids. She has been evaluated by her PCP recently for the same and has been on a prescribed rectal cream and stool softeners. Patient reports that she continues to have mild bleeding when wiping. There is no rectal pain or itching. She denies any constipation. Reports that her stools have been soft.  Patient also complains of generalized skin itching. States that it feels like something is crawling on her. Denies any rashes. No new soaps, detergents, lotions, perfumes or foods. She has tried rubbing alcohol and hydrocortisone cream with minimal relief.              Past Medical History:  Diagnosis Date  . Chronic back pain    HNp,spondylosis,radiculopathy  . COPD (chronic obstructive pulmonary disease) (HCC)   . Hyperlipidemia    was on choloesterol meds last yr-samples given in office but not needed since  . Hypertension    takes Exforge daily  . Joint pain   . Pneumonia    hx of;many yrs ago  . Shortness of breath    takes Singulair daily;with exertion  . Sickle cell trait (HCC)   . Spondylolysis   . Tobacco use     Patient Active Problem List   Diagnosis Date Noted  . Lumbar radiculopathy 02/27/2017  . Poor dentition 02/11/2017  .  Acute bronchitis   . Chest pain 05/07/2016  . Chest pain at rest 05/07/2016  . Essential hypertension   . Hyperlipidemia   . Tobacco use   . Spondylolysis   . Chronic obstructive pulmonary disease (HCC)   . Lumbar spondylosis 01/20/2014  . HNP (herniated nucleus pulposus), lumbar 06/04/2012    Past Surgical History:  Procedure Laterality Date  . BACK SURGERY    . BREAST SURGERY Left    lumpectomy  . COLONOSCOPY    . DILATION AND CURETTAGE OF UTERUS    . LUMBAR LAMINECTOMY/DECOMPRESSION MICRODISCECTOMY  06/04/2012   Procedure: LUMBAR LAMINECTOMY/DECOMPRESSION MICRODISCECTOMY 1 LEVEL;  Surgeon: Carmela Hurt, MD;  Location: [facility];  Service: Neurosurgery;  Laterality: Left;  LEFT Lumbar five-sacral one diskectomy  . LUMBAR LAMINECTOMY/DECOMPRESSION MICRODISCECTOMY Right 06/16/2013   Procedure: RIGHT Lumbar Five-Sacral One Microdiskectomy;  Surgeon: Carmela Hurt, MD;  Location: [facility];  Service: Neurosurgery;  Laterality: Right;  RIGHT Lumbar Five-Sacral One Microdiskectomy  . LUMBAR LAMINECTOMY/DECOMPRESSION MICRODISCECTOMY Right 01/20/2014   Procedure: LUMBAR FOUR TO LUMBAR FIVE LUMBAR LAMINECTOMY/DECOMPRESSION MICRODISCECTOMY 1 LEVEL;  Surgeon: Carmela Hurt, MD;  Location: [facility];  Service: Neurosurgery;  Laterality: Right;  Right L45 laminectomy and foramintomy    OB History    No data available       Home Medications    Prior to Admission medications  Medication Sig Start Date End Date Taking? Authorizing Provider  Amlodipine-Valsartan-HCTZ (EXFORGE HCT) 5-160-12.5 MG TABS Take 1 tablet by mouth daily. 06/09/17   Jaclyn Shaggy, MD  fluticasone furoate-vilanterol (BREO ELLIPTA) 100-25 MCG/INH AEPB Inhale 1 puff into the lungs daily. 06/08/17   Jaclyn Shaggy, MD  gabapentin (NEURONTIN) 300 MG capsule Take 2 capsules (600 mg total) by mouth 2 (two) times daily. 06/09/17   Jaclyn Shaggy, MD  ipratropium (ATROVENT) 0.06 % nasal spray Place 2 sprays into both  nostrils 4 (four) times daily. Patient not taking: Reported on 06/09/2017 11/11/16   Linna Hoff, MD  lactulose (CHRONULAC) 10 GM/15ML solution Take 15 mLs (10 g total) by mouth 2 (two) times daily as needed for mild constipation. 06/23/17   Jaclyn Shaggy, MD  meloxicam (MOBIC) 7.5 MG tablet Take 2 tablets (15 mg total) by mouth daily. 06/09/17   Jaclyn Shaggy, MD  montelukast (SINGULAIR) 10 MG tablet Take 10 mg by mouth daily.     [provider]  Multiple Vitamin (MULTIVITAMIN WITH MINERALS) TABS Take 1 tablet by mouth daily.    [provider]  polyethylene glycol powder (GLYCOLAX/MIRALAX) powder Take 17 g by mouth daily. 06/09/17   Jaclyn Shaggy, MD  pramoxine-hydrocortisone (PROCTOCREAM-HC) 1-1 % rectal cream Place 1 application rectally 2 (two) times daily. 06/25/17   Jaclyn Shaggy, MD  VENTOLIN HFA 108 (90 Base) MCG/ACT inhaler Inhale 2 puffs into the lungs every 6 (six) hours as needed. 06/08/17   Jaclyn Shaggy, MD    Family History Family History  Problem Relation Age of Onset  . Cancer - Other Mother   . CAD Father   . Hypertension Sister     Social History Social History  Substance Use Topics  . Smoking status: Current Every Day Smoker    Packs/day: 0.25    Years: 20.00    Types: Cigarettes  . Smokeless tobacco: Never Used  . Alcohol use No     Allergies   Patient has no known allergies.   Review of Systems Review of Systems   Physical Exam Triage Vital Signs ED Triage Vitals  Enc Vitals Group     BP 07/02/17 1758 116/80     Pulse Rate 07/02/17 1758 87     Resp 07/02/17 1758 16     Temp 07/02/17 1758 99.2 F (37.3 C)     Temp Source 07/02/17 1758 Oral     SpO2 07/02/17 1758 99 %     Weight 07/02/17 1757 130 lb (59 kg)     Height 07/02/17 1757  (1.549 m)     Head Circumference --      Peak Flow --      Pain Score 07/02/17 1757 3     Pain Loc --      Pain Edu? --      Excl. in GC? --    No data found.   Updated Vital Signs BP  116/80   Pulse 87   Temp 99.2 F (37.3 C) (Oral)   Resp 16   Ht  (1.549 m)   Wt 130 lb (59 kg)   LMP 03/17/2012   SpO2 99%   BMI 24.56 kg/m   Visual Acuity Right Eye Distance:   Left Eye Distance:   Bilateral Distance:    Right Eye Near:   Left Eye Near:    Bilateral Near:     Physical Exam   UC Treatments / Results  Labs (all labs ordered are listed, but  only abnormal results are displayed) Labs Reviewed - No data to display  EKG  EKG Interpretation None       Radiology No results found.  Procedures Procedures (including critical care time)  Medications Ordered in UC Medications - No data to display   Initial Impression / Assessment and Plan / UC Course  I have reviewed the triage vital signs and the nursing notes.  Pertinent labs & imaging results that were available during my care of the patient were reviewed by me and considered in my medical decision making (see chart for details).    61 year old female who presents with multiple complaints. Evaluation has revealed no signs of a dangerous process. Patient aware of findings.  Discussed diagnosis and treatment of URI. Discussed the importance of avoiding unnecessary antibiotic therapy. Suggested symptomatic OTC remedies. Suggested brief course of Afrin or similar. Nasal saline spray for congestion. Advised patient to follow-up with PCP regarding her hemorrhoids. She may need surgical referral for hemorrhoidectomy. Encouraged patient to use unscented products on her scan and lukewarm showers. She may need referral for allergy testing. Advised to follow-up with her PCP regarding this as well. Discussed smoking cessation.   Discussed diagnosis and treatment with patient. All questions have been answered and all concerns have been addressed. The patient verbalized understanding and had no further questions   Final Clinical Impressions(s) / UC Diagnoses   Final diagnoses:  Viral upper respiratory tract  infection  Hemorrhoids, unspecified hemorrhoid type  Skin irritation    New Prescriptions New Prescriptions   No medications on file     Controlled Substance Prescriptions [place] Controlled Substance Registry consulted? Not Applicable   Lurline Idol, [place] 07/02/17 1836

## 2017-07-31 ENCOUNTER — Encounter: Payer: Self-pay | Admitting: Family Medicine

## 2017-07-31 ENCOUNTER — Ambulatory Visit: Payer: Self-pay | Attending: Family Medicine | Admitting: Family Medicine

## 2017-07-31 VITALS — BP 137/82 | HR 86 | Temp 97.6°F | Ht 61.0 in | Wt 128.4 lb

## 2017-07-31 DIAGNOSIS — Z01419 Encounter for gynecological examination (general) (routine) without abnormal findings: Secondary | ICD-10-CM | POA: Insufficient documentation

## 2017-07-31 DIAGNOSIS — I10 Essential (primary) hypertension: Secondary | ICD-10-CM | POA: Insufficient documentation

## 2017-07-31 DIAGNOSIS — Z79899 Other long term (current) drug therapy: Secondary | ICD-10-CM | POA: Insufficient documentation

## 2017-07-31 DIAGNOSIS — J449 Chronic obstructive pulmonary disease, unspecified: Secondary | ICD-10-CM | POA: Insufficient documentation

## 2017-07-31 DIAGNOSIS — Z0000 Encounter for general adult medical examination without abnormal findings: Secondary | ICD-10-CM

## 2017-07-31 DIAGNOSIS — L299 Pruritus, unspecified: Secondary | ICD-10-CM | POA: Insufficient documentation

## 2017-07-31 DIAGNOSIS — Z23 Encounter for immunization: Secondary | ICD-10-CM | POA: Insufficient documentation

## 2017-07-31 DIAGNOSIS — Z124 Encounter for screening for malignant neoplasm of cervix: Secondary | ICD-10-CM

## 2017-07-31 DIAGNOSIS — D573 Sickle-cell trait: Secondary | ICD-10-CM | POA: Insufficient documentation

## 2017-07-31 MED ORDER — HYDROXYZINE HCL 25 MG PO TABS: 25.0000 mg | ORAL_TABLET | Freq: Three times a day (TID) | ORAL | 2 refills | Status: DC | PRN

## 2017-07-31 MED FILL — !VENTOLIN HFA INHALER: 108 (90 BAS | 25 days supply | Qty: 18 | Fill #3

## 2017-07-31 MED FILL — GABAPENTIN 300 MG CAPSULE: 300 | 30 days supply | Qty: 120 | Fill #1

## 2017-07-31 MED FILL — hydrOXYzine HCL 25 MG TABS: 25 | 20 days supply | Qty: 60 | Fill #0

## 2017-07-31 MED FILL — $BREO ELLIPTA 100-25 MCG IH: 100-25 MCG | 30 days supply | Qty: 60 | Fill #3

## 2017-07-31 MED FILL — AMLOD-VALSA-HCTZ 5-160-12.5: 5-160-12.5 | 30 days supply | Qty: 30 | Fill #1

## 2017-07-31 MED FILL — MELOXICAM 7.5 MG TABLET: 7.5 | 15 days supply | Qty: 30 | Fill #1

## 2017-07-31 NOTE — Progress Notes (Signed)
Subjective:  Patient ID: Tammy Schwartz, female    DOB: 09-23-1956  Age: 61 y.o. MRN: [id number]  CC: Annual Exam and Gynecologic Exam   HPI Tammy Schwartz presents forA complete physical exam. She complains of crawling sensation which is generalized but denies presence of rash. States she is up-to-date on her colonoscopy which she states she had last year- report is not available to [place]. Mammogram from 10/2016 was negative for malignancy.  Past Medical History:  Diagnosis Date  . Chronic back pain    HNp,spondylosis,radiculopathy  . COPD (chronic obstructive pulmonary disease) (HCC)   . Hyperlipidemia    was on choloesterol meds last yr-samples given in office but not needed since  . Hypertension    takes Exforge daily  . Joint pain   . Pneumonia    hx of;many yrs ago  . Shortness of breath    takes Singulair daily;with exertion  . Sickle cell trait (HCC)   . Spondylolysis   . Tobacco use     Past Surgical History:  Procedure Laterality Date  . BACK SURGERY    . BREAST SURGERY Left    lumpectomy  . COLONOSCOPY    . DILATION AND CURETTAGE OF UTERUS    . LUMBAR LAMINECTOMY/DECOMPRESSION MICRODISCECTOMY  06/04/2012   Procedure: LUMBAR LAMINECTOMY/DECOMPRESSION MICRODISCECTOMY 1 LEVEL;  Surgeon: Carmela Hurt, MD;  [facility];  Service: Neurosurgery;  Laterality: Left;  LEFT Lumbar five-sacral one diskectomy  . LUMBAR LAMINECTOMY/DECOMPRESSION MICRODISCECTOMY Right 06/16/2013   Procedure: RIGHT Lumbar Five-Sacral One Microdiskectomy;  Surgeon: Carmela Hurt, MD;  [facility];  Service: Neurosurgery;  Laterality: Right;  RIGHT Lumbar Five-Sacral One Microdiskectomy  . LUMBAR LAMINECTOMY/DECOMPRESSION MICRODISCECTOMY Right 01/20/2014   Procedure: LUMBAR FOUR TO LUMBAR FIVE LUMBAR LAMINECTOMY/DECOMPRESSION MICRODISCECTOMY 1 LEVEL;  Surgeon: Carmela Hurt, MD;  [facility];  Service: Neurosurgery;  Laterality: Right;  Right L45 laminectomy  and foramintomy    No Known Allergies   Outpatient Medications Prior to Visit  Medication Sig Dispense Refill  . Amlodipine-Valsartan-HCTZ (EXFORGE HCT) 5-160-12.5 MG TABS Take 1 tablet by mouth daily. 30 tablet 3  . fexofenadine-pseudoephedrine (ALLEGRA-D) 60-120 MG 12 hr tablet Take 1 tablet by mouth every 12 (twelve) hours. 30 tablet 0  . fluticasone (FLONASE) 50 MCG/ACT nasal spray Place 1 spray into both nostrils daily. 16 g 0  . fluticasone furoate-vilanterol (BREO ELLIPTA) 100-25 MCG/INH AEPB Inhale 1 puff into the lungs daily. 90 each 3  . gabapentin (NEURONTIN) 300 MG capsule Take 2 capsules (600 mg total) by mouth 2 (two) times daily. 120 capsule 3  . lactulose (CHRONULAC) 10 GM/15ML solution Take 15 mLs (10 g total) by mouth 2 (two) times daily as needed for mild constipation. 946 mL 0  . meloxicam (MOBIC) 7.5 MG tablet Take 2 tablets (15 mg total) by mouth daily. 30 tablet 3  . montelukast (SINGULAIR) 10 MG tablet Take 10 mg by mouth daily.     . Multiple Vitamin (MULTIVITAMIN WITH MINERALS) TABS Take 1 tablet by mouth daily.    . polyethylene glycol powder (GLYCOLAX/MIRALAX) powder Take 17 g by mouth daily. 3350 g 1  . pramoxine-hydrocortisone (PROCTOCREAM-HC) 1-1 % rectal cream Place 1 application rectally 2 (two) times daily. 30 g 0  . VENTOLIN HFA 108 (90 Base) MCG/ACT inhaler Inhale 2 puffs into the lungs every 6 (six) hours as needed. 54 g 3  . ipratropium (ATROVENT) 0.06 % nasal spray Place 2 sprays into both nostrils 4 (  four) times daily. (Patient not taking: Reported on 06/09/2017) 15 mL 1   No facility-administered medications prior to visit.     ROS Review of Systems  Constitutional: Negative for activity change, appetite change and fatigue.  HENT: Negative for congestion, sinus pressure and sore throat.   Eyes: Negative for visual disturbance.  Respiratory: Negative for cough, chest tightness, shortness of breath and wheezing.   Cardiovascular: Negative for chest  pain and palpitations.  Gastrointestinal: Negative for abdominal distention, abdominal pain and constipation.  Endocrine: Negative for polydipsia.  Genitourinary: Negative for dysuria and frequency.  Musculoskeletal: Negative for arthralgias and back pain.  Skin: Negative for rash.  Neurological: Negative for tremors, light-headedness and numbness.  Hematological: Does not bruise/bleed easily.  Psychiatric/Behavioral: Negative for agitation and behavioral problems.    Objective:  BP 137/82   Pulse 86   Temp 97.6 F (36.4 C) (Oral)   Ht 5\' 1"  (1.549 m)   Wt 128 lb 6.4 oz (58.2 kg)   LMP 03/17/2012   SpO2 100%   BMI 24.26 kg/m   BP/Weight 07/31/2017 07/02/2017 06/09/2017  Systolic BP 137 116 119  Diastolic BP 82 80 72  Wt. (Lbs) 128.4 130 131.4  BMI 24.26 24.56 24.83      Physical Exam  Constitutional: She is oriented to person, place, and time. She appears well-developed and well-nourished. No distress.  HENT:  Head: Normocephalic.  Right Ear: External ear normal.  Left Ear: External ear normal.  Nose: Nose normal.  Mouth/Throat: Oropharynx is clear and moist.  Eyes: Pupils are equal, round, and reactive to light. Conjunctivae and EOM are normal.  Neck: Normal range of motion. No JVD present.  Cardiovascular: Normal rate, regular rhythm, normal heart sounds and intact distal pulses.  Exam reveals no gallop.   No murmur heard. Pulmonary/Chest: Effort normal and breath sounds normal. No respiratory distress. She has no wheezes. She has no rales. She exhibits no tenderness. Right breast exhibits no mass, no nipple discharge and no tenderness. Left breast exhibits no mass, no nipple discharge and no tenderness.  Abdominal: Soft. Bowel sounds are normal. She exhibits no distension and no mass. There is no tenderness.  Genitourinary:  Genitourinary Comments: External genitalia, vagina, cervix, adnexa -  normal  Musculoskeletal: Normal range of motion. She exhibits no edema or  tenderness.  Neurological: She is alert and oriented to person, place, and time. She has normal reflexes.  Skin: Skin is warm and dry. She is not diaphoretic.  Psychiatric: She has a normal mood and affect.     Assessment & Plan:   1. Annual physical exam Will need to obtain colonoscopy report Counseled on 150 minutes of exercise every week, healthy eating, routine healthcare maintenance.   2. Need for influenza vaccination - Flu Vaccine QUAD 36+ mos IM  3. Need for Tdap vaccination Tdap administered  4. Pruritus - hydrOXYzine (ATARAX/VISTARIL) 25 MG tablet; Take 1 tablet (25 mg total) by mouth every 8 (eight) hours as needed.  Dispense: 60 tablet; Refill: 2  5. Screening for cervical cancer - Cytology - PAP [place]   Meds ordered this encounter  Medications  . hydrOXYzine (ATARAX/VISTARIL) 25 MG tablet    Sig: Take 1 tablet (25 mg total) by mouth every 8 (eight) hours as needed.    Dispense:  60 tablet    Refill:  2    Follow-up: Return in about 3 months (around 10/31/2017) for follow up of chronic medical conditions.   Jaclyn ShaggyEnobong Amao MD

## 2017-07-31 NOTE — Patient Instructions (Signed)

## 2017-08-03 MED FILL — HYDROCORT-PRAMOXINE 1%-1% C: 1-1 | 15 days supply | Qty: 30 | Fill #0

## 2017-08-05 LAB — CYTOLOGY - PAP
DIAGNOSIS: NEGATIVE
HPV (WINDOPATH): NOT DETECTED

## 2017-08-12 ENCOUNTER — Telehealth: Payer: Self-pay

## 2017-08-12 NOTE — Telephone Encounter (Signed)
Pt was called and informed of lab results. 

## 2017-08-17 ENCOUNTER — Ambulatory Visit: Payer: Self-pay | Attending: Family Medicine

## 2017-08-28 MED FILL — hydrOXYzine HCL 25 MG TABS: 25 | 20 days supply | Qty: 60 | Fill #1

## 2017-08-31 MED FILL — !BREO ELLIPTA 100-25 MCG IN: 100-25 | 30 days supply | Qty: 30 | Fill #0

## 2017-08-31 MED FILL — !VENTOLIN HFA INHALER: 108 (90 BAS | 25 days supply | Qty: 18 | Fill #0

## 2017-09-16 ENCOUNTER — Telehealth: Payer: Self-pay | Admitting: Family Medicine

## 2017-09-16 NOTE — Telephone Encounter (Signed)
Pt. Called [facility] and she has been treated that she needs dentures. Pt. States that she does not have money and the dentist that she goes to does not do those procedures. Please f/u with pt.

## 2017-09-18 MED FILL — hydrOXYzine HCL 25 MG TABS: 25 | 20 days supply | Qty: 60 | Fill #2

## 2017-09-18 MED FILL — GABAPENTIN 300 MG CAPSULE: 300 | 30 days supply | Qty: 120 | Fill #2

## 2017-09-18 MED FILL — AMLOD-VALSA-HCTZ 5-160-12.5: 5-160-12.5 | 30 days supply | Qty: 30 | Fill #2

## 2017-09-23 MED FILL — $VENTOLIN HFA 18G INHALER: 108 (90 BAS | 25 days supply | Qty: 18 | Fill #1

## 2017-09-23 MED FILL — $BREO ELLIPTA 100-25 MCG IH: 100-25 MCG | 30 days supply | Qty: 30 | Fill #1

## 2017-09-24 NOTE — Telephone Encounter (Signed)
I called patient last week and today and left a message to call me back

## 2017-09-25 ENCOUNTER — Ambulatory Visit: Payer: Self-pay | Admitting: Internal Medicine

## 2017-09-25 ENCOUNTER — Other Ambulatory Visit: Payer: Self-pay | Admitting: Family Medicine

## 2017-09-25 DIAGNOSIS — K648 Other hemorrhoids: Secondary | ICD-10-CM

## 2017-09-28 ENCOUNTER — Other Ambulatory Visit: Payer: Self-pay

## 2017-09-28 ENCOUNTER — Other Ambulatory Visit: Payer: Self-pay | Admitting: Family Medicine

## 2017-09-28 ENCOUNTER — Ambulatory Visit: Payer: Self-pay | Attending: Family Medicine | Admitting: Physician Assistant

## 2017-09-28 ENCOUNTER — Other Ambulatory Visit: Payer: Self-pay | Admitting: Physician Assistant

## 2017-09-28 VITALS — BP 130/75 | HR 88 | Temp 98.7°F | Resp 16 | Ht 61.0 in | Wt 124.2 lb

## 2017-09-28 DIAGNOSIS — Z7951 Long term (current) use of inhaled steroids: Secondary | ICD-10-CM | POA: Insufficient documentation

## 2017-09-28 DIAGNOSIS — G8929 Other chronic pain: Secondary | ICD-10-CM | POA: Insufficient documentation

## 2017-09-28 DIAGNOSIS — I10 Essential (primary) hypertension: Secondary | ICD-10-CM | POA: Insufficient documentation

## 2017-09-28 DIAGNOSIS — M5416 Radiculopathy, lumbar region: Secondary | ICD-10-CM | POA: Insufficient documentation

## 2017-09-28 DIAGNOSIS — E785 Hyperlipidemia, unspecified: Secondary | ICD-10-CM | POA: Insufficient documentation

## 2017-09-28 DIAGNOSIS — Z79899 Other long term (current) drug therapy: Secondary | ICD-10-CM | POA: Insufficient documentation

## 2017-09-28 DIAGNOSIS — Z8701 Personal history of pneumonia (recurrent): Secondary | ICD-10-CM | POA: Insufficient documentation

## 2017-09-28 DIAGNOSIS — J449 Chronic obstructive pulmonary disease, unspecified: Secondary | ICD-10-CM | POA: Insufficient documentation

## 2017-09-28 DIAGNOSIS — J069 Acute upper respiratory infection, unspecified: Secondary | ICD-10-CM | POA: Insufficient documentation

## 2017-09-28 DIAGNOSIS — L299 Pruritus, unspecified: Secondary | ICD-10-CM | POA: Insufficient documentation

## 2017-09-28 DIAGNOSIS — D573 Sickle-cell trait: Secondary | ICD-10-CM | POA: Insufficient documentation

## 2017-09-28 MED ORDER — HYDROXYZINE HCL 25 MG PO TABS: 25.0000 mg | ORAL_TABLET | Freq: Three times a day (TID) | ORAL | 2 refills | Status: DC | PRN

## 2017-09-28 MED ORDER — BENZONATATE 100 MG PO CAPS: 100.0000 mg | ORAL_CAPSULE | Freq: Three times a day (TID) | ORAL | 0 refills | Status: DC | PRN

## 2017-09-28 MED ORDER — DICLOFENAC SODIUM 75 MG PO TBEC: 75.0000 mg | DELAYED_RELEASE_TABLET | Freq: Two times a day (BID) | ORAL | 0 refills | Status: DC

## 2017-09-28 MED ORDER — MELOXICAM 7.5 MG PO TABS: 15.0000 mg | ORAL_TABLET | Freq: Every day | ORAL | 0 refills | Status: DC

## 2017-09-28 MED ORDER — CETIRIZINE HCL 10 MG PO TABS: 10.0000 mg | ORAL_TABLET | Freq: Every day | ORAL | 11 refills | Status: DC

## 2017-09-28 MED ORDER — AMLODIPINE-VALSARTAN-HCTZ 5-160-12.5 MG PO TABS: 1.0000 | ORAL_TABLET | Freq: Every day | ORAL | 3 refills | Status: DC

## 2017-09-28 MED ORDER — HYDROCORTISONE ACE-PRAMOXINE 1-1 % RE FOAM: 1.0000 | Freq: Two times a day (BID) | RECTAL | 1 refills | Status: DC

## 2017-09-28 MED FILL — hydrOXYzine HCL 25 MG TABS: 25 | 20 days supply | Qty: 60 | Fill #0

## 2017-09-28 MED FILL — !PROCTOFOAM-HC 1%-1% FOAM: 1-1 | 7 days supply | Qty: 10 | Fill #0

## 2017-09-28 MED FILL — ?DICLOFENAC SOD DR 75 MG TA: 75 | 30 days supply | Qty: 60 | Fill #0

## 2017-09-28 MED FILL — ?CETIRIZINE HCL 10 MG TABLE: 10 | 30 days supply | Qty: 30 | Fill #0

## 2017-09-28 MED FILL — BENZONATATE 100 MG CAPSULE: 100 | 6 days supply | Qty: 20 | Fill #0

## 2017-09-28 MED FILL — AMLOD-VALSA-HCTZ 5-160-12.5: 5-160-12.5 | 30 days supply | Qty: 30 | Fill #0

## 2017-09-28 NOTE — Progress Notes (Signed)
Pt has rash all over. States she did not have relief with hydroxyzine

## 2017-09-28 NOTE — Progress Notes (Signed)
Patient ID: Tammy MacadamVerna S Schwartz, female   DOB: 04/08/1956, 61 y.o.   MRN: [id number]   Tammy Schwartz, is a 61 y.o. female  [id number]  [id number]  DOB - 05/12/1956  Subjective:  Chief Complaint and HPI: Tammy Schwartz is a 61 y.o. female here today c/o "rash all over" and generalized itching.  She tells me this has only been going on for about a week, but it has been documented multiple times at other office visits.  Hydroxyzine has been prescribed with some relief.  No new soaps, lotions, detergents, or medications.  No f/c.  She has had a cough for 2-3 days.    BP is controlled on meds.    ROS:   Constitutional:  No f/c, No night sweats, No unexplained weight loss. EENT:  No vision changes, No blurry vision, No hearing changes. No mouth, throat, or ear problems.  Respiratory: No cough, No SOB Cardiac: No CP, no palpitations GI:  No abd pain, No N/V/D. GU: No Urinary s/sx Musculoskeletal: No joint pain Neuro: No headache, no dizziness, no motor weakness.  Skin: + rash Endocrine:  No polydipsia. No polyuria.  Psych: Denies SI/HI  No problems updated.  ALLERGIES: No Known Allergies  PAST MEDICAL HISTORY: Past Medical History:  Diagnosis Date  . Chronic back pain    HNp,spondylosis,radiculopathy  . COPD (chronic obstructive pulmonary disease) (HCC)   . Hyperlipidemia    was on choloesterol meds last yr-samples given in office but not needed since  . Hypertension    takes Exforge daily  . Joint pain   . Pneumonia    hx of;many yrs ago  . Shortness of breath    takes Singulair daily;with exertion  . Sickle cell trait (HCC)   . Spondylolysis   . Tobacco use     MEDICATIONS AT HOME: Prior to Admission medications   Medication Sig Start Date End Date Taking? Authorizing Provider  Amlodipine-Valsartan-HCTZ (EXFORGE HCT) 5-160-12.5 MG TABS Take 1 tablet by mouth daily. 09/28/17  Yes Ebbie Cherry M, PA-C  fluticasone (FLONASE) 50 MCG/ACT nasal spray Place 1  spray into both nostrils daily. 07/02/17  Yes Murrill, Lelon MastSamantha, FNP  fluticasone furoate-vilanterol (BREO ELLIPTA) 100-25 MCG/INH AEPB Inhale 1 puff into the lungs daily. 06/08/17  Yes Jaclyn ShaggyAmao, Enobong, MD  gabapentin (NEURONTIN) 300 MG capsule Take 2 capsules (600 mg total) by mouth 2 (two) times daily. 06/09/17  Yes Jaclyn ShaggyAmao, Enobong, MD  hydrOXYzine (ATARAX/VISTARIL) 25 MG tablet Take 1 tablet (25 mg total) by mouth every 8 (eight) hours as needed for itching. 09/28/17  Yes Georgian CoMcClung, Bailley [place] M, PA-C  meloxicam (MOBIC) 7.5 MG tablet Take 2 tablets (15 mg total) by mouth daily. Prn pain 09/28/17  Yes Anders SimmondsMcClung, Astria Jordahl M, PA-C  montelukast (SINGULAIR) 10 MG tablet Take 10 mg by mouth daily.    Yes [provider]  VENTOLIN HFA 108 (90 Base) MCG/ACT inhaler Inhale 2 puffs into the lungs every 6 (six) hours as needed. 06/08/17  Yes Jaclyn ShaggyAmao, Enobong, MD  benzonatate (TESSALON) 100 MG capsule Take 1 capsule (100 mg total) by mouth 3 (three) times daily as needed for cough. 09/28/17   Anders SimmondsMcClung, Nakiah Osgood M, PA-C  cetirizine (ZYRTEC) 10 MG tablet Take 1 tablet (10 mg total) by mouth daily. 09/28/17   Anders SimmondsMcClung, Ben Habermann M, PA-C  ipratropium (ATROVENT) 0.06 % nasal spray Place 2 sprays into both nostrils 4 (four) times daily. Patient not taking: Reported on 06/09/2017 11/11/16   Linna HoffKindl, James D, MD  lactulose [facility](CHRONULAC) 10 GM/15ML solution Take  15 mLs (10 g total) by mouth 2 (two) times daily as needed for mild constipation. Patient not taking: Reported on 09/28/2017 06/23/17   Jaclyn ShaggyAmao, Enobong, MD  Multiple Vitamin (MULTIVITAMIN WITH MINERALS) TABS Take 1 tablet by mouth daily.    [provider]  polyethylene glycol powder (GLYCOLAX/MIRALAX) powder Take 17 g by mouth daily. Patient not taking: Reported on 09/28/2017 06/09/17   Jaclyn ShaggyAmao, Enobong, MD  pramoxine-hydrocortisone (PROCTOCREAM-HC) 1-1 % rectal cream PLACE 1 APPLICATION RECTALLY 2 (TWO) TIMES DAILY. Patient not taking: Reported on 09/28/2017 09/28/17   Jaclyn ShaggyAmao,  Enobong, MD     Objective:  EXAM:   Vitals:   09/28/17 1453  BP: 130/75  Pulse: 88  Resp: 16  Temp: 98.7 F (37.1 C)  TempSrc: Oral  SpO2: 98%  Weight: 124 lb 3.2 oz (56.3 kg)  Height: 5\' 1"  (1.549 m)    General appearance : A&OX3. NAD. Non-toxic-appearing HEENT: Atraumatic and Normocephalic.  PERRLA. EOM intact.  Neck: supple, no JVD. No cervical lymphadenopathy. No thyromegaly Chest/Lungs:  Breathing-non-labored, Good air entry bilaterally, breath sounds normal without rales or rhonchi.  There is some occasional, mild wheezing  CVS: S1 S2 regular, no murmurs, gallops, rubs  Extremities: Bilateral Lower Ext shows no edema, both legs are warm to touch with = pulse throughout Neurology:  CN II-XII grossly intact, Non focal.   Psych:  Scattered speech.  TP difficult to follow.  Pleasant and appropriately dressed.   Skin:  No Rash.  I am unable to see any rash on her hands, legs, abdomen, trunk, feet, back.    Data Review No results found for: HGBA1C   Assessment & Plan   1. Upper respiratory tract infection, unspecified type/cough X 3 days - benzonatate (TESSALON) 100 MG capsule; Take 1 capsule (100 mg total) by mouth 3 (three) times daily as needed for cough.  Dispense: 20 capsule; Refill: 0  2. Essential hypertension Controlled, continue current regimen.   - Amlodipine-Valsartan-HCTZ (EXFORGE HCT) 5-160-12.5 MG TABS; Take 1 tablet by mouth daily.  Dispense: 30 tablet; Refill: 3  3. Lumbar radiculopathy Wanted RF for leg pains - meloxicam (MOBIC) 7.5 MG tablet; Take 2 tablets (15 mg total) by mouth daily. Prn pain  Dispense: 60 tablet; Refill: 0  4. Pruritus No visible rash.  No jaundice or signs of liver issues.   - hydrOXYzine (ATARAX/VISTARIL) 25 MG tablet; Take 1 tablet (25 mg total) by mouth every 8 (eight) hours as needed for itching.  Dispense: 60 tablet; Refill: 2 - cetirizine (ZYRTEC) 10 MG tablet; Take 1 tablet (10 mg total) by mouth daily.  Dispense: 30  tablet; Refill: 11   Patient have been counseled extensively about nutrition and exercise  Return in about 5 weeks (around 11/02/2017) for keep appt with Dr Alvis LemmingsNewlin for regular f/up.  The patient was given clear instructions to go to ER or return to medical center if symptoms don't improve, worsen or new problems develop. The patient verbalized understanding. The patient was told to call to get lab results if they haven't heard anything in the next week.     Georgian CoAngela Jesse Hirst, PA-C [facility] [place], [place] [phone]   09/28/2017, 4:01 PM

## 2017-10-08 ENCOUNTER — Ambulatory Visit (HOSPITAL_COMMUNITY)
Admission: EM | Admit: 2017-10-08 | Discharge: 2017-10-08 | Disposition: A | Discharge status: Home / Self Care | Payer: Self-pay | Attending: Internal Medicine | Admitting: Internal Medicine

## 2017-10-08 ENCOUNTER — Other Ambulatory Visit: Payer: Self-pay

## 2017-10-08 ENCOUNTER — Encounter (HOSPITAL_COMMUNITY): Payer: Self-pay | Admitting: Emergency Medicine

## 2017-10-08 DIAGNOSIS — M79604 Pain in right leg: Secondary | ICD-10-CM

## 2017-10-08 DIAGNOSIS — R110 Nausea: Secondary | ICD-10-CM

## 2017-10-08 DIAGNOSIS — L299 Pruritus, unspecified: Secondary | ICD-10-CM

## 2017-10-08 DIAGNOSIS — M79605 Pain in left leg: Secondary | ICD-10-CM

## 2017-10-08 MED ORDER — ACETAMINOPHEN 325 MG PO TABS: 650.0000 mg | ORAL_TABLET | Freq: Four times a day (QID) | ORAL | 0 refills | Status: DC | PRN

## 2017-10-08 MED ORDER — ONDANSETRON HCL 4 MG PO TABS: 4.0000 mg | ORAL_TABLET | Freq: Three times a day (TID) | ORAL | 0 refills | Status: DC | PRN

## 2017-10-08 NOTE — ED Triage Notes (Addendum)
Pt reports right leg pain and a rash all over her body for several weeks.  Pt was prescribes Atarax by her PCP on 09/28/17 and states it is not working.

## 2017-10-08 NOTE — ED Provider Notes (Signed)
[facility]    CSN: [id number] Arrival date & time: 10/08/17  1450     History   Chief Complaint Chief Complaint  Patient presents with  . Rash  . Leg Pain    right    HPI Tammy Schwartz is a 61 y.o. female.   Tammy Schwartz presents today with complaints of itching "all over" as well as bilateral she has seen her PCP for this and was given cetirizine and hydroxizine which she states have not been helping. This has been ongoing for weeks she states. Without known exacerbating factors. Denies history of diabetes. Rates pain to her legs 10/10, her med list shows she is prescribed gabapentin, she is unclear if she is taking this or not. No known injury to legs. Pain is worse with standing too long. The pain comes and goes. She feels she has a decreased appetite and feels sick with eating. States this has been ongoing. Denies abdominal pain, vomiting or diarrhea. Denies heartburn or chest pain.     ROS per HPI.       Past Medical History:  Diagnosis Date  . Chronic back pain    HNp,spondylosis,radiculopathy  . COPD (chronic obstructive pulmonary disease) (HCC)   . Hyperlipidemia    was on choloesterol meds last yr-samples given in office but not needed since  . Hypertension    takes Exforge daily  . Joint pain   . Pneumonia    hx of;many yrs ago  . Shortness of breath    takes Singulair daily;with exertion  . Sickle cell trait (HCC)   . Spondylolysis   . Tobacco use     Patient Active Problem List   Diagnosis Date Noted  . Lumbar radiculopathy 02/27/2017  . Poor dentition 02/11/2017  . Acute bronchitis   . Chest pain 05/07/2016  . Chest pain at rest 05/07/2016  . Essential hypertension   . Hyperlipidemia   . Tobacco use   . Spondylolysis   . Chronic obstructive pulmonary disease (HCC)   . Lumbar spondylosis 01/20/2014  . HNP (herniated nucleus pulposus), lumbar 06/04/2012    Past Surgical History:  Procedure Laterality Date  . BACK SURGERY    .  BREAST SURGERY Left    lumpectomy  . COLONOSCOPY    . DILATION AND CURETTAGE OF UTERUS    . LUMBAR LAMINECTOMY/DECOMPRESSION MICRODISCECTOMY  06/04/2012   Procedure: LUMBAR LAMINECTOMY/DECOMPRESSION MICRODISCECTOMY 1 LEVEL;  Surgeon: Carmela Hurt, MD;  Location: [facility];  Service: Neurosurgery;  Laterality: Left;  LEFT Lumbar five-sacral one diskectomy  . LUMBAR LAMINECTOMY/DECOMPRESSION MICRODISCECTOMY Right 06/16/2013   Procedure: RIGHT Lumbar Five-Sacral One Microdiskectomy;  Surgeon: Carmela Hurt, MD;  Location: [facility];  Service: Neurosurgery;  Laterality: Right;  RIGHT Lumbar Five-Sacral One Microdiskectomy  . LUMBAR LAMINECTOMY/DECOMPRESSION MICRODISCECTOMY Right 01/20/2014   Procedure: LUMBAR FOUR TO LUMBAR FIVE LUMBAR LAMINECTOMY/DECOMPRESSION MICRODISCECTOMY 1 LEVEL;  Surgeon: Carmela Hurt, MD;  Location: [facility];  Service: Neurosurgery;  Laterality: Right;  Right L45 laminectomy and foramintomy    OB History    No data available       Home Medications    Prior to Admission medications   Medication Sig Start Date End Date Taking? Authorizing Provider  Amlodipine-Valsartan-HCTZ (EXFORGE HCT) 5-160-12.5 MG TABS Take 1 tablet by mouth daily. 09/28/17  Yes McClung, Angela M, PA-C  benzonatate (TESSALON) 100 MG capsule Take 1 capsule (100 mg total) by mouth 3 (three) times daily as needed for cough. 09/28/17  Yes  Georgian CoMcClung, Angela M, PA-C  cetirizine (ZYRTEC) 10 MG tablet Take 1 tablet (10 mg total) by mouth daily. 09/28/17  Yes Anders SimmondsMcClung, Angela M, PA-C  diclofenac (VOLTAREN) 75 MG EC tablet Take 1 tablet (75 mg total) by mouth 2 (two) times daily. Prn pain 09/28/17  Yes Anders SimmondsMcClung, Angela M, PA-C  fluticasone (FLONASE) 50 MCG/ACT nasal spray Place 1 spray into both nostrils daily. 07/02/17  Yes Murrill, Lelon MastSamantha, FNP  fluticasone furoate-vilanterol (BREO ELLIPTA) 100-25 MCG/INH AEPB Inhale 1 puff into the lungs daily. 06/08/17  Yes Jaclyn ShaggyAmao, Enobong, MD  gabapentin (NEURONTIN)  300 MG capsule Take 2 capsules (600 mg total) by mouth 2 (two) times daily. 06/09/17  Yes Jaclyn ShaggyAmao, Enobong, MD  hydrocortisone-pramoxine (PROCTOFOAM HC) rectal foam Place 1 applicator rectally 2 (two) times daily. 09/28/17  Yes Jaclyn ShaggyAmao, Enobong, MD  hydrOXYzine (ATARAX/VISTARIL) 25 MG tablet Take 1 tablet (25 mg total) by mouth every 8 (eight) hours as needed for itching. 09/28/17  Yes Georgian CoMcClung, Angela M, PA-C  ipratropium (ATROVENT) 0.06 % nasal spray Place 2 sprays into both nostrils 4 (four) times daily. 11/11/16  Yes Kindl, Quita SkyeJames D, MD  lactulose (CHRONULAC) 10 GM/15ML solution Take 15 mLs (10 g total) by mouth 2 (two) times daily as needed for mild constipation. 06/23/17  Yes Amao, Odette HornsEnobong, MD  montelukast (SINGULAIR) 10 MG tablet Take 10 mg by mouth daily.    Yes [provider]  Multiple Vitamin (MULTIVITAMIN WITH MINERALS) TABS Take 1 tablet by mouth daily.   Yes [provider]  polyethylene glycol powder (GLYCOLAX/MIRALAX) powder Take 17 g by mouth daily. 06/09/17  Yes Amao, Odette HornsEnobong, MD  VENTOLIN HFA 108 (90 Base) MCG/ACT inhaler Inhale 2 puffs into the lungs every 6 (six) hours as needed. 06/08/17  Yes Jaclyn ShaggyAmao, Enobong, MD  acetaminophen (TYLENOL) 325 MG tablet Take 2 tablets (650 mg total) by mouth every 6 (six) hours as needed. 10/08/17   Georgetta HaberBurky, Natalie B, NP  ondansetron (ZOFRAN) 4 MG tablet Take 1 tablet (4 mg total) by mouth every 8 (eight) hours as needed for nausea or vomiting. 10/08/17   Georgetta HaberBurky, Natalie B, NP    Family History Family History  Problem Relation Age of Onset  . Cancer - Other Mother   . CAD Father   . Hypertension Sister     Social History Social History   Tobacco Use  . Smoking status: Current Every Day Smoker    Packs/day: 0.25    Years: 20.00    Pack years: 5.00    Types: Cigarettes  . Smokeless tobacco: Never Used  Substance Use Topics  . Alcohol use: No  . Drug use: No     Allergies   Patient has no known allergies.   Review of  Systems Review of Systems   Physical Exam Triage Vital Signs ED Triage Vitals  Enc Vitals Group     BP 10/08/17 1528 132/76     Pulse Rate 10/08/17 1528 87     Resp --      Temp 10/08/17 1528 98.3 F (36.8 C)     Temp Source 10/08/17 1528 Oral     SpO2 10/08/17 1528 100 %     Weight --      Height --      Head Circumference --      Peak Flow --      Pain Score 10/08/17 1529 10     Pain Loc --      Pain Edu? --      Excl.  in GC? --    No data found.  Updated Vital Signs BP 132/76 (BP Location: Right Arm)   Pulse 87   Temp 98.3 F (36.8 C) (Oral)   LMP 03/17/2012   SpO2 100%   Visual Acuity Right Eye Distance:   Left Eye Distance:   Bilateral Distance:    Right Eye Near:   Left Eye Near:    Bilateral Near:     Physical Exam  Constitutional: She is oriented to person, place, and time. She appears well-developed and well-nourished. No distress.  HENT:  Head: Normocephalic and atraumatic.  Right Ear: External ear normal.  Left Ear: External ear normal.  Nose: Nose normal.  Mouth/Throat: Oropharynx is clear and moist.  Eyes: Conjunctivae and EOM are normal. Pupils are equal, round, and reactive to light.  Neck: Normal range of motion.  Cardiovascular: Normal rate, regular rhythm and normal heart sounds.  Pulmonary/Chest: Effort normal and breath sounds normal.  Musculoskeletal:       Right upper leg: Normal. She exhibits no tenderness, no bony tenderness, no swelling and no deformity.       Left upper leg: Normal. She exhibits no tenderness, no bony tenderness, no swelling and no deformity.  Neurological: She is alert and oriented to person, place, and time.  Skin: Skin is warm and dry. No rash noted. No erythema.     UC Treatments / Results  Labs (all labs ordered are listed, but only abnormal results are displayed) Labs Reviewed - No data to display  EKG  EKG Interpretation None       Radiology No results found.  Procedures Procedures  (including critical care time)  Medications Ordered in UC Medications - No data to display   Initial Impression / Assessment and Plan / UC Course  I have reviewed the triage vital signs and the nursing notes.  Pertinent labs & imaging results that were available during my care of the patient were reviewed by me and considered in my medical decision making (see chart for details).     Patient without rash noted to body, thorough skin check performed. Without lesions, without scabbing or excoriation, without yellowing of eyes, without dry skin noted. Glucose may of this year without concern for diabetes. Leg exam without acute findings for injury or trauma. Recommended patient continue to follow with PCP for these apparent chronic concerns, as may need further evaluation or referral to dermatology if itching persists. zofran as needed for nausea, patient currently without nausea. Patient verbalized understanding and agreeable to plan.  Ambulatory out of clinic without difficulty.    Final Clinical Impressions(s) / UC Diagnoses   Final diagnoses:  Pruritus  Nausea  Bilateral leg pain    ED Discharge Orders        Ordered    acetaminophen (TYLENOL) 325 MG tablet  Every 6 hours PRN     10/08/17 1708    ondansetron (ZOFRAN) 4 MG tablet  Every 8 hours PRN     10/08/17 1708       Controlled Substance Prescriptions [place] Controlled Substance Registry consulted? Not Applicable   Georgetta HaberBurky, Natalie B, NP 10/08/17 [phone]

## 2017-10-08 NOTE — Discharge Instructions (Signed)
Please continue taking hydroxyzine as prescribed for skin itching.  Use of a humidifier may be beneficial. Keep showers short and cool as tolerable. Zofran as needed for nausea. Tylenol as needed for leg pain. Please follow up with your PCP in 1-2 weeks for recheck.

## 2017-10-09 ENCOUNTER — Ambulatory Visit: Payer: Self-pay | Attending: Family Medicine | Admitting: Family Medicine

## 2017-10-09 ENCOUNTER — Encounter: Payer: Self-pay | Admitting: Family Medicine

## 2017-10-09 VITALS — BP 116/75 | HR 94 | Temp 97.9°F | Resp 18 | Ht 61.0 in | Wt 120.0 lb

## 2017-10-09 DIAGNOSIS — Z79899 Other long term (current) drug therapy: Secondary | ICD-10-CM | POA: Insufficient documentation

## 2017-10-09 DIAGNOSIS — R630 Anorexia: Secondary | ICD-10-CM | POA: Insufficient documentation

## 2017-10-09 DIAGNOSIS — G4700 Insomnia, unspecified: Secondary | ICD-10-CM | POA: Insufficient documentation

## 2017-10-09 DIAGNOSIS — L299 Pruritus, unspecified: Secondary | ICD-10-CM | POA: Insufficient documentation

## 2017-10-09 DIAGNOSIS — G4709 Other insomnia: Secondary | ICD-10-CM

## 2017-10-09 DIAGNOSIS — M25561 Pain in right knee: Secondary | ICD-10-CM | POA: Insufficient documentation

## 2017-10-09 DIAGNOSIS — Z720 Tobacco use: Secondary | ICD-10-CM | POA: Insufficient documentation

## 2017-10-09 DIAGNOSIS — G8929 Other chronic pain: Secondary | ICD-10-CM | POA: Insufficient documentation

## 2017-10-09 DIAGNOSIS — I10 Essential (primary) hypertension: Secondary | ICD-10-CM | POA: Insufficient documentation

## 2017-10-09 DIAGNOSIS — E785 Hyperlipidemia, unspecified: Secondary | ICD-10-CM | POA: Insufficient documentation

## 2017-10-09 DIAGNOSIS — M5416 Radiculopathy, lumbar region: Secondary | ICD-10-CM | POA: Insufficient documentation

## 2017-10-09 DIAGNOSIS — D573 Sickle-cell trait: Secondary | ICD-10-CM | POA: Insufficient documentation

## 2017-10-09 DIAGNOSIS — J449 Chronic obstructive pulmonary disease, unspecified: Secondary | ICD-10-CM | POA: Insufficient documentation

## 2017-10-09 DIAGNOSIS — M25562 Pain in left knee: Secondary | ICD-10-CM | POA: Insufficient documentation

## 2017-10-09 MED ORDER — MIRTAZAPINE 15 MG PO TABS: 15.0000 mg | ORAL_TABLET | Freq: Every day | ORAL | 3 refills | Status: DC

## 2017-10-09 MED ORDER — TRIAMCINOLONE ACETONIDE 0.1 % EX CREA: 1.0000 "application " | TOPICAL_CREAM | Freq: Two times a day (BID) | CUTANEOUS | 1 refills | Status: DC

## 2017-10-09 MED ORDER — DICLOFENAC SODIUM 75 MG PO TBEC: 75.0000 mg | DELAYED_RELEASE_TABLET | Freq: Two times a day (BID) | ORAL | 3 refills | Status: DC

## 2017-10-09 MED ORDER — GABAPENTIN 300 MG PO CAPS: 600.0000 mg | ORAL_CAPSULE | Freq: Three times a day (TID) | ORAL | 3 refills | Status: DC

## 2017-10-09 MED FILL — TRIAMCINOLONE ACETONIDE 0.1: 0.1 | 20 days supply | Qty: 45 | Fill #0

## 2017-10-09 MED FILL — ?MIRTAZAPINE 15 MG TABLET: 15 | 30 days supply | Qty: 30 | Fill #0

## 2017-10-09 NOTE — Patient Instructions (Signed)

## 2017-10-09 NOTE — Progress Notes (Signed)
Subjective:  Patient ID: Tammy Schwartz, female    DOB: 05/12/1956  Age: 61 y.o. MRN: [id number]  CC: Skin Problem   HPI Tammy Schwartz  is a 61 year old female with history of hypertension, tobacco abuse, COPD, lumbar spondylosis status post back surgery who presents today complaining of complaining of generalized pruritus, breaking out and a 'crawling sensation on her skin'. She had complained of the symptoms 2 months ago at her visit with me but no rash was visible on exam and she had presented for visit with the PA at 1 week ago and again to the ED yesterday.  She has received hydroxyzine, Zyrtec with no improvement in symptoms.  Pruritus occurs during the day and night and she shares a bed with her husband who does not experience similar symptoms. She informs me she is breaking out in a red rash which is not visible to me. Denies allergies to soaps or creams. Also describes symptoms as electricity going through her body-of note she takes gabapentin for lumbar radiculopathy.  Complains of bilateral knee pain which is worse than the right knee and is intermittent and she has used Tylenol and Motrin with no relief in symptoms.  She has difficulty sleeping and has had a reduced appetite and is wondering if she could receive something to help improve her appetite and sleep. His intake of caffeinated products.    Past Medical History:  Diagnosis Date  . Chronic back pain    HNp,spondylosis,radiculopathy  . COPD (chronic obstructive pulmonary disease) (HCC)   . Hyperlipidemia    was on choloesterol meds last yr-samples given in office but not needed since  . Hypertension    takes Exforge daily  . Joint pain   . Pneumonia    hx of;many yrs ago  . Shortness of breath    takes Singulair daily;with exertion  . Sickle cell trait (HCC)   . Spondylolysis   . Tobacco use     Past Surgical History:  Procedure Laterality Date  . BACK SURGERY    . BREAST SURGERY Left    lumpectomy  . COLONOSCOPY    . DILATION AND CURETTAGE OF UTERUS    . LUMBAR LAMINECTOMY/DECOMPRESSION MICRODISCECTOMY  06/04/2012   Procedure: LUMBAR LAMINECTOMY/DECOMPRESSION MICRODISCECTOMY 1 LEVEL;  Surgeon: Carmela HurtKyle L Cabbell, MD;  Location: [facility];  Service: Neurosurgery;  Laterality: Left;  LEFT Lumbar five-sacral one diskectomy  . LUMBAR LAMINECTOMY/DECOMPRESSION MICRODISCECTOMY Right 06/16/2013   Procedure: RIGHT Lumbar Five-Sacral One Microdiskectomy;  Surgeon: Carmela HurtKyle L Cabbell, MD;  Location: [facility];  Service: Neurosurgery;  Laterality: Right;  RIGHT Lumbar Five-Sacral One Microdiskectomy  . LUMBAR LAMINECTOMY/DECOMPRESSION MICRODISCECTOMY Right 01/20/2014   Procedure: LUMBAR FOUR TO LUMBAR FIVE LUMBAR LAMINECTOMY/DECOMPRESSION MICRODISCECTOMY 1 LEVEL;  Surgeon: Carmela HurtKyle L Cabbell, MD;  Location: [facility];  Service: Neurosurgery;  Laterality: Right;  Right L45 laminectomy and foramintomy    No Known Allergies   Outpatient Medications Prior to Visit  Medication Sig Dispense Refill  . acetaminophen (TYLENOL) 325 MG tablet Take 2 tablets (650 mg total) by mouth every 6 (six) hours as needed. 30 tablet 0  . Amlodipine-Valsartan-HCTZ (EXFORGE HCT) 5-160-12.5 MG TABS Take 1 tablet by mouth daily. 30 tablet 3  . benzonatate (TESSALON) 100 MG capsule Take 1 capsule (100 mg total) by mouth 3 (three) times daily as needed for cough. 20 capsule 0  . cetirizine (ZYRTEC) 10 MG tablet Take 1 tablet (10 mg total) by mouth daily. 30 tablet 11  . fluticasone (  FLONASE) 50 MCG/ACT nasal spray Place 1 spray into both nostrils daily. 16 g 0  . fluticasone furoate-vilanterol (BREO ELLIPTA) 100-25 MCG/INH AEPB Inhale 1 puff into the lungs daily. 90 each 3  . hydrocortisone-pramoxine (PROCTOFOAM HC) rectal foam Place 1 applicator rectally 2 (two) times daily. 20 g 1  . hydrOXYzine (ATARAX/VISTARIL) 25 MG tablet Take 1 tablet (25 mg total) by mouth every 8 (eight) hours as needed for itching. 60 tablet 2    . ipratropium (ATROVENT) 0.06 % nasal spray Place 2 sprays into both nostrils 4 (four) times daily. 15 mL 1  . lactulose (CHRONULAC) 10 GM/15ML solution Take 15 mLs (10 g total) by mouth 2 (two) times daily as needed for mild constipation. 946 mL 0  . montelukast (SINGULAIR) 10 MG tablet Take 10 mg by mouth daily.     . Multiple Vitamin (MULTIVITAMIN WITH MINERALS) TABS Take 1 tablet by mouth daily.    . ondansetron (ZOFRAN) 4 MG tablet Take 1 tablet (4 mg total) by mouth every 8 (eight) hours as needed for nausea or vomiting. 20 tablet 0  . polyethylene glycol powder (GLYCOLAX/MIRALAX) powder Take 17 g by mouth daily. 3350 g 1  . VENTOLIN HFA 108 (90 Base) MCG/ACT inhaler Inhale 2 puffs into the lungs every 6 (six) hours as needed. 54 g 3  . diclofenac (VOLTAREN) 75 MG EC tablet Take 1 tablet (75 mg total) by mouth 2 (two) times daily. Prn pain 60 tablet 0  . gabapentin (NEURONTIN) 300 MG capsule Take 2 capsules (600 mg total) by mouth 2 (two) times daily. 120 capsule 3   No facility-administered medications prior to visit.     ROS Review of Systems  Constitutional: Positive for appetite change. Negative for activity change and fatigue.  HENT: Negative for congestion, sinus pressure and sore throat.   Eyes: Negative for visual disturbance.  Respiratory: Negative for cough, chest tightness, shortness of breath and wheezing.   Cardiovascular: Negative for chest pain and palpitations.  Gastrointestinal: Negative for abdominal distention, abdominal pain and constipation.  Endocrine: Negative for polydipsia.  Genitourinary: Negative for dysuria and frequency.  Musculoskeletal:       See hpi  Skin: Positive for rash.  Neurological: Negative for tremors, light-headedness and numbness.  Hematological: Does not bruise/bleed easily.  Psychiatric/Behavioral: Positive for sleep disturbance. Negative for agitation and behavioral problems.    Objective:  BP 116/75 (BP Location: Right Arm,  Patient Position: Sitting, Cuff Size: Normal)   Pulse 94   Temp 97.9 F (36.6 C) (Oral)   Resp 18   Ht 5\' 1"  (1.549 m)   Wt 120 lb (54.4 kg)   LMP 03/17/2012   SpO2 98%   BMI 22.67 kg/m   BP/Weight 10/09/2017 10/08/2017 09/28/2017  Systolic BP 116 132 130  Diastolic BP 75 76 75  Wt. (Lbs) 120 - 124.2  BMI 22.67 - 23.47      Physical Exam  Constitutional: She is oriented to person, place, and time. She appears well-developed and well-nourished.  Cardiovascular: Normal rate, normal heart sounds and intact distal pulses.  No murmur heard. Pulmonary/Chest: Effort normal and breath sounds normal. She has no wheezes. She has no rales. She exhibits no tenderness.  Abdominal: Soft. Bowel sounds are normal. She exhibits no distension and no mass. There is no tenderness.  Musculoskeletal: Normal range of motion.  Normal ROM of both knees  Neurological: She is alert and oriented to person, place, and time.  Skin: Skin is warm and dry. No  rash noted. No erythema.     Assessment & Plan:   1. Lumbar radiculopathy S/p previous surgery Increased dose of gabapentin given nonspecific skin symptoms in the event that she is having paresthesia - gabapentin (NEURONTIN) 300 MG capsule; Take 2 capsules (600 mg total) by mouth 3 (three) times daily.  Dispense: 180 capsule; Refill: 3  2. Chronic pain of both knees Likely underlying osteoarthritis - diclofenac (VOLTAREN) 75 MG EC tablet; Take 1 tablet (75 mg total) by mouth 2 (two) times daily. Prn pain  Dispense: 60 tablet; Refill: 3  3. Pruritus No rash visible on exam We will treat with topical steroid Questionable somatic hallucination-if persisting at next visit we will explore this further - triamcinolone cream (KENALOG) 0.1 %; Apply 1 application topically 2 (two) times daily.  Dispense: 45 g; Refill: 1  4. Other insomnia Discussed sleep hygiene Avoid caffeinated products later in the day - mirtazapine (REMERON) 15 MG tablet; Take 1  tablet (15 mg total) by mouth at bedtime.  Dispense: 30 tablet; Refill: 3  5. Loss of appetite - mirtazapine (REMERON) 15 MG tablet; Take 1 tablet (15 mg total) by mouth at bedtime.  Dispense: 30 tablet; Refill: 3   Meds ordered this encounter  Medications  . diclofenac (VOLTAREN) 75 MG EC tablet    Sig: Take 1 tablet (75 mg total) by mouth 2 (two) times daily. Prn pain    Dispense:  60 tablet    Refill:  3  . gabapentin (NEURONTIN) 300 MG capsule    Sig: Take 2 capsules (600 mg total) by mouth 3 (three) times daily.    Dispense:  180 capsule    Refill:  3  . triamcinolone cream (KENALOG) 0.1 %    Sig: Apply 1 application topically 2 (two) times daily.    Dispense:  45 g    Refill:  1  . mirtazapine (REMERON) 15 MG tablet    Sig: Take 1 tablet (15 mg total) by mouth at bedtime.    Dispense:  30 tablet    Refill:  3    Follow-up: Return in about 3 months (around 01/07/2018) for follow up of chronic medical conditions.   Jaclyn ShaggyEnobong Amao MD

## 2017-10-14 ENCOUNTER — Other Ambulatory Visit: Payer: Self-pay | Admitting: Family Medicine

## 2017-10-26 ENCOUNTER — Other Ambulatory Visit: Payer: Self-pay | Admitting: Obstetrics and Gynecology

## 2017-10-26 DIAGNOSIS — Z1231 Encounter for screening mammogram for malignant neoplasm of breast: Secondary | ICD-10-CM

## 2017-11-02 ENCOUNTER — Ambulatory Visit: Payer: Self-pay | Admitting: Family Medicine

## 2017-11-05 MED FILL — hydrOXYzine HCL 25 MG TABS: 25 | 20 days supply | Qty: 60 | Fill #1

## 2017-11-09 MED FILL — $BREO ELLIPTA 100-25 MCG IH: 100-25 MCG | 30 days supply | Qty: 30 | Fill #2

## 2017-11-09 MED FILL — $VENTOLIN HFA 18G INHALER: 108 (90 BAS | 25 days supply | Qty: 18 | Fill #2

## 2017-11-09 MED FILL — GABAPENTIN 300 MG CAPSULE: 300 | 30 days supply | Qty: 120 | Fill #3

## 2017-11-19 ENCOUNTER — Ambulatory Visit
Admission: RE | Admit: 2017-11-19 | Discharge: 2017-11-19 | Disposition: A | Discharge status: Home / Self Care | Payer: Self-pay | Source: Ambulatory Visit | Attending: Obstetrics and Gynecology | Admitting: Obstetrics and Gynecology

## 2017-11-19 ENCOUNTER — Ambulatory Visit (HOSPITAL_COMMUNITY): Payer: Self-pay | Attending: Obstetrics and Gynecology

## 2017-11-19 DIAGNOSIS — Z1231 Encounter for screening mammogram for malignant neoplasm of breast: Secondary | ICD-10-CM

## 2017-11-20 ENCOUNTER — Ambulatory Visit (HOSPITAL_COMMUNITY)
Admission: EM | Admit: 2017-11-20 | Discharge: 2017-11-20 | Disposition: A | Discharge status: Home / Self Care | Payer: Self-pay | Attending: Internal Medicine | Admitting: Internal Medicine

## 2017-11-20 ENCOUNTER — Ambulatory Visit: Payer: Self-pay | Attending: Family Medicine

## 2017-11-20 ENCOUNTER — Encounter (HOSPITAL_COMMUNITY): Payer: Self-pay | Admitting: Emergency Medicine

## 2017-11-20 DIAGNOSIS — R319 Hematuria, unspecified: Secondary | ICD-10-CM | POA: Insufficient documentation

## 2017-11-20 LAB — POCT URINALYSIS DIP (DEVICE)
Bilirubin Urine: NEGATIVE
Glucose, UA: NEGATIVE mg/dL
KETONES UR: NEGATIVE mg/dL
Leukocytes, UA: NEGATIVE
Nitrite: NEGATIVE
PH: 7 (ref 5.0–8.0)
Protein, ur: 30 mg/dL — AB
SPECIFIC GRAVITY, URINE: 1.02 (ref 1.005–1.030)
Urobilinogen, UA: 0.2 mg/dL (ref 0.0–1.0)

## 2017-11-20 LAB — POCT I-STAT, CHEM 8
BUN: 8 mg/dL (ref 6–20)
CHLORIDE: 110 mmol/L (ref 101–111)
Calcium, Ion: 1.13 mmol/L — ABNORMAL LOW (ref 1.15–1.40)
Creatinine, Ser: 0.9 mg/dL (ref 0.44–1.00)
GLUCOSE: 96 mg/dL (ref 65–99)
HCT: 38 % (ref 36.0–46.0)
Hemoglobin: 12.9 g/dL (ref 12.0–15.0)
POTASSIUM: 3.8 mmol/L (ref 3.5–5.1)
Sodium: 144 mmol/L (ref 135–145)
TCO2: 28 mmol/L (ref 22–32)

## 2017-11-20 NOTE — ED Triage Notes (Signed)
PT C/O: UTI sx onset 2 days associated w/hematuria   Voices no other concerns  DENIES: fevers, dysuria, n/v/d, abd pain   TAKING MEDS: none   A&O x4... NAD... Ambulatory

## 2017-11-20 NOTE — ED Provider Notes (Signed)
[facility]    CSN: [id number] Arrival date & time: 11/20/17  1820     History   Chief Complaint Chief Complaint  Patient presents with  . Urinary Tract Infection    HPI CATHERENE KALETA is a 62 y.o. female.   Reagen presents with complaints of hematuria which started a few days ago. Without pain or frequency with urination. Denies back pain, abdominal pain or fevers. History of htn, hyperlipidemia, copd, smokes daily. Without vaginal or rectal bleeding.    ROS per HPI.       Past Medical History:  Diagnosis Date  . Chronic back pain    HNp,spondylosis,radiculopathy  . COPD (chronic obstructive pulmonary disease) (HCC)   . Hyperlipidemia    was on choloesterol meds last yr-samples given in office but not needed since  . Hypertension    takes Exforge daily  . Joint pain   . Pneumonia    hx of;many yrs ago  . Shortness of breath    takes Singulair daily;with exertion  . Sickle cell trait (HCC)   . Spondylolysis   . Tobacco use     Patient Active Problem List   Diagnosis Date Noted  . Lumbar radiculopathy 02/27/2017  . Poor dentition 02/11/2017  . Acute bronchitis   . Chest pain 05/07/2016  . Chest pain at rest 05/07/2016  . Essential hypertension   . Hyperlipidemia   . Tobacco use   . Spondylolysis   . Chronic obstructive pulmonary disease (HCC)   . Lumbar spondylosis 01/20/2014  . HNP (herniated nucleus pulposus), lumbar 06/04/2012    Past Surgical History:  Procedure Laterality Date  . BACK SURGERY    . BREAST SURGERY Left    lumpectomy  . COLONOSCOPY    . DILATION AND CURETTAGE OF UTERUS    . LUMBAR LAMINECTOMY/DECOMPRESSION MICRODISCECTOMY  06/04/2012   Procedure: LUMBAR LAMINECTOMY/DECOMPRESSION MICRODISCECTOMY 1 LEVEL;  Surgeon: Carmela Hurt, MD;  Location: [facility];  Service: Neurosurgery;  Laterality: Left;  LEFT Lumbar five-sacral one diskectomy  . LUMBAR LAMINECTOMY/DECOMPRESSION MICRODISCECTOMY Right 06/16/2013   Procedure: RIGHT Lumbar Five-Sacral One Microdiskectomy;  Surgeon: Carmela Hurt, MD;  Location: [facility];  Service: Neurosurgery;  Laterality: Right;  RIGHT Lumbar Five-Sacral One Microdiskectomy  . LUMBAR LAMINECTOMY/DECOMPRESSION MICRODISCECTOMY Right 01/20/2014   Procedure: LUMBAR FOUR TO LUMBAR FIVE LUMBAR LAMINECTOMY/DECOMPRESSION MICRODISCECTOMY 1 LEVEL;  Surgeon: Carmela Hurt, MD;  Location: [facility];  Service: Neurosurgery;  Laterality: Right;  Right L45 laminectomy and foramintomy    OB History    No data available       Home Medications    Prior to Admission medications   Medication Sig Start Date End Date Taking? Authorizing Provider  Amlodipine-Valsartan-HCTZ (EXFORGE HCT) 5-160-12.5 MG TABS Take 1 tablet by mouth daily. 09/28/17  Yes Anders Simmonds, PA-C  acetaminophen (TYLENOL) 325 MG tablet Take 2 tablets (650 mg total) by mouth every 6 (six) hours as needed. 10/08/17   Georgetta Haber, NP  benzonatate (TESSALON) 100 MG capsule Take 1 capsule (100 mg total) by mouth 3 (three) times daily as needed for cough. 09/28/17   Anders Simmonds, PA-C  cetirizine (ZYRTEC) 10 MG tablet Take 1 tablet (10 mg total) by mouth daily. 09/28/17   Anders Simmonds, PA-C  diclofenac (VOLTAREN) 75 MG EC tablet Take 1 tablet (75 mg total) by mouth 2 (two) times daily. Prn pain 10/09/17   Hoy Register, MD  fluticasone (FLONASE) 50 MCG/ACT nasal spray Place 1  spray into both nostrils daily. 07/02/17   Lurline Idol, FNP  fluticasone furoate-vilanterol (BREO ELLIPTA) 100-25 MCG/INH AEPB Inhale 1 puff into the lungs daily. 06/08/17   Hoy Register, MD  gabapentin (NEURONTIN) 300 MG capsule Take 2 capsules (600 mg total) by mouth 3 (three) times daily. 10/09/17   Hoy Register, MD  hydrocortisone-pramoxine (PROCTOFOAM HC) rectal foam Place 1 applicator rectally 2 (two) times daily. 09/28/17   Hoy Register, MD  hydrOXYzine (ATARAX/VISTARIL) 25 MG tablet Take 1 tablet (25 mg  total) by mouth every 8 (eight) hours as needed for itching. 09/28/17   Anders Simmonds, PA-C  ipratropium (ATROVENT) 0.06 % nasal spray Place 2 sprays into both nostrils 4 (four) times daily. 11/11/16   Linna Hoff, MD  lactulose (CHRONULAC) 10 GM/15ML solution Take 15 mLs (10 g total) by mouth 2 (two) times daily as needed for mild constipation. 06/23/17   Hoy Register, MD  mirtazapine (REMERON) 15 MG tablet Take 1 tablet (15 mg total) by mouth at bedtime. 10/09/17   Hoy Register, MD  montelukast (SINGULAIR) 10 MG tablet Take 10 mg by mouth daily.     [provider]  Multiple Vitamin (MULTIVITAMIN WITH MINERALS) TABS Take 1 tablet by mouth daily.    [provider]  ondansetron (ZOFRAN) 4 MG tablet Take 1 tablet (4 mg total) by mouth every 8 (eight) hours as needed for nausea or vomiting. 10/08/17   Linus Mako B, NP  polyethylene glycol powder (GLYCOLAX/MIRALAX) powder Take 17 g by mouth daily. 06/09/17   Hoy Register, MD  triamcinolone cream (KENALOG) 0.1 % Apply 1 application topically 2 (two) times daily. 10/09/17   Hoy Register, MD  VENTOLIN HFA 108 (90 Base) MCG/ACT inhaler Inhale 2 puffs into the lungs every 6 (six) hours as needed. 06/08/17   Hoy Register, MD    Family History Family History  Problem Relation Age of Onset  . Cancer - Other Mother   . CAD Father   . Hypertension Sister     Social History Social History   Tobacco Use  . Smoking status: Current Every Day Smoker    Packs/day: 0.25    Years: 20.00    Pack years: 5.00    Types: Cigarettes  . Smokeless tobacco: Never Used  Substance Use Topics  . Alcohol use: No  . Drug use: No     Allergies   Patient has no known allergies.   Review of Systems Review of Systems   Physical Exam Triage Vital Signs ED Triage Vitals [11/20/17 1951]  Enc Vitals Group     BP      Pulse Rate 85     Resp 18     Temp 98.6 F (37 C)     Temp Source Oral     SpO2 100 %     Weight        Height      Head Circumference      Peak Flow      Pain Score      Pain Loc      Pain Edu?      Excl. in GC?    No data found.  Updated Vital Signs Pulse 85   Temp 98.6 F (37 C) (Oral)   Resp 18   LMP 03/17/2012   SpO2 100%   Visual Acuity Right Eye Distance:   Left Eye Distance:   Bilateral Distance:    Right Eye Near:   Left Eye Near:  Bilateral Near:     Physical Exam  Constitutional: She is oriented to person, place, and time. She appears well-developed and well-nourished. No distress.  Cardiovascular: Normal rate, regular rhythm and normal heart sounds.  Pulmonary/Chest: Effort normal and breath sounds normal.  Abdominal: Soft. There is no tenderness.  Genitourinary:  Genitourinary Comments: Without vaginal bleeding on exam; without frank blood with rectal exam  Neurological: She is alert and oriented to person, place, and time.  Skin: Skin is warm and dry.     UC Treatments / Results  Labs (all labs ordered are listed, but only abnormal results are displayed) Labs Reviewed  POCT URINALYSIS DIP (DEVICE) - Abnormal; Notable for the following components:      Result Value   Hgb urine dipstick LARGE (*)    Protein, ur 30 (*)    All other components within normal limits  POCT I-STAT, CHEM 8 - Abnormal; Notable for the following components:   Calcium, Ion 1.13 (*)    All other components within normal limits  URINE CULTURE    EKG  EKG Interpretation None       Radiology No results found.  Procedures Procedures (including critical care time)  Medications Ordered in UC Medications - No data to display   Initial Impression / Assessment and Plan / UC Course  I have reviewed the triage vital signs and the nursing notes.  Pertinent labs & imaging results that were available during my care of the patient were reviewed by me and considered in my medical decision making (see chart for details).     Creatinine Wnl. Without leuks or nitrite  to urine. Without symptoms of UTI. Smoker and hx htn. Urine sent for culture. Encouraged increased fluid intake. To make appointment with pcp on Monday for recheck and follow up. Return precautions provided. Patient verbalized understanding and agreeable to plan.    Final Clinical Impressions(s) / UC Diagnoses   Final diagnoses:  Hematuria, unspecified type    ED Discharge Orders    None       Controlled Substance Prescriptions [place] Controlled Substance Registry consulted? Not Applicable   Georgetta HaberBurky, Natalie B, NP 11/20/17 [id number]

## 2017-11-20 NOTE — Discharge Instructions (Signed)
Please increase your fluid intake to ensure regular emptying of bladder.  We will call you if your culture returns positive for urinary tract infection. Please call your PCP on Monday for recheck as you may need further imaging and evaluation for this. If develop pain, unable to urinate or otherwise worsening please go to [facility].

## 2017-11-22 LAB — URINE CULTURE: Culture: <10000 — AB

## 2017-11-25 MED FILL — TRIAMCINOLONE ACETONIDE 0.1: 0.1 | 20 days supply | Qty: 45 | Fill #1

## 2017-11-25 MED FILL — hydrOXYzine HCL 25 MG TABS: 25 | 20 days supply | Qty: 60 | Fill #2

## 2017-12-09 MED FILL — !VENTOLIN HFA INHALER: 108 (90 BAS | 25 days supply | Qty: 18 | Fill #3

## 2017-12-09 MED FILL — ?MIRTAZAPINE 15 MG TABLET: 15 | 30 days supply | Qty: 30 | Fill #1

## 2017-12-09 MED FILL — GABAPENTIN 300 MG CAPSULE: 300 | 30 days supply | Qty: 180 | Fill #0

## 2017-12-09 MED FILL — !BREO ELLIPTA 100-25 MCG IN: 100-25 | 30 days supply | Qty: 30 | Fill #3

## 2017-12-10 ENCOUNTER — Ambulatory Visit (HOSPITAL_COMMUNITY)
Admission: RE | Admit: 2017-12-10 | Discharge: 2017-12-10 | Disposition: A | Discharge status: Home / Self Care | Payer: Self-pay | Source: Ambulatory Visit | Attending: Obstetrics and Gynecology | Admitting: Obstetrics and Gynecology

## 2017-12-10 ENCOUNTER — Encounter (HOSPITAL_COMMUNITY): Payer: Self-pay

## 2017-12-10 ENCOUNTER — Ambulatory Visit
Admission: RE | Admit: 2017-12-10 | Discharge: 2017-12-10 | Disposition: A | Discharge status: Home / Self Care | Payer: No Typology Code available for payment source | Source: Ambulatory Visit | Attending: Obstetrics and Gynecology | Admitting: Obstetrics and Gynecology

## 2017-12-10 VITALS — BP 116/68 | Ht 61.0 in

## 2017-12-10 DIAGNOSIS — Z1239 Encounter for other screening for malignant neoplasm of breast: Secondary | ICD-10-CM

## 2017-12-10 NOTE — Patient Instructions (Signed)
Explained breast self awareness with Tammy MacadamVerna S Schwartz. Patient did not need a Pap smear today due to last Pap smear and HPV typing was 07/31/2017. Let her know [facility] will cover Pap smears and HPV typing every 5 years unless has a history of abnormal Pap smears. [facility]. Appointment scheduled for Thursday, December 10, 2017 at 1400. Let patient know the Breast Center will follow up with her within the next couple weeks with results of mammogram by letter or phone. Discussed smoking cessation with patient. Referred to the [facility] Quit[facility] and gave resources to free smoking cessation classes at [facility]. Tammy Schwartz verbalized understanding.  Celeste Candelas, Kathaleen Maserhristine Poll, RN 1:37 PM

## 2017-12-10 NOTE — Progress Notes (Signed)
No complaints today.   Pap Smear: Pap smear not completed today. [facility] and normal with negative HPV. Per patient has no history of an abnormal Pap smear. Last Pap smear result is in Epic.  Physical exam: Breasts Breasts symmetrical. No skin abnormalities bilateral breasts. No nipple retraction bilateral breasts. No nipple discharge bilateral breasts. No lymphadenopathy. No lumps palpated bilateral breasts. No complaints of pain or tenderness on exam. Referred [facility] to [facility] for a screening mammogram. Appointment scheduled for Thursday, December 10, 2017 at 1400.        Pelvic/Bimanual No Pap smear completed today since last Pap smear and HPV typing was 07/31/2017. Pap smear not indicated per [facility]CP guidelines.   Smoking History: Patient is a current smoker. Discussed smoking cessation with patient. Referred to the [facility] Quit[facility] and gave resources to free smoking cessation classes at [facility].  Patient Navigation: Patient education provided. Access to services provided for patient through [facility].   Colorectal Cancer Screening: Per patient had a colonoscopy completed in 2017. No complaints today. FIT Test given to patient to complete and return to [facility].  Breast and Cervical Cancer Risk Assessment: Patient has no family history of breast cancer, known genetic mutations, or radiation treatment to the chest before age 63. Patient has no history of cervical dysplasia, immunocompromised, or DES exposure in-utero.

## 2017-12-11 ENCOUNTER — Encounter (HOSPITAL_COMMUNITY): Payer: Self-pay | Admitting: *Deleted

## 2017-12-17 ENCOUNTER — Ambulatory Visit: Payer: Self-pay | Attending: Family Medicine

## 2017-12-18 ENCOUNTER — Other Ambulatory Visit: Payer: Self-pay

## 2017-12-21 LAB — FECAL OCCULT BLOOD, IMMUNOCHEMICAL: FECAL OCCULT BLD: NEGATIVE

## 2017-12-21 LAB — SPECIMEN STATUS REPORT

## 2017-12-22 ENCOUNTER — Encounter (HOSPITAL_COMMUNITY): Payer: Self-pay | Admitting: *Deleted

## 2017-12-22 NOTE — Progress Notes (Signed)
Letter mailed to patient with negative Fit Test results.  

## 2017-12-24 ENCOUNTER — Other Ambulatory Visit: Payer: Self-pay | Admitting: Physician Assistant

## 2017-12-24 DIAGNOSIS — J069 Acute upper respiratory infection, unspecified: Secondary | ICD-10-CM

## 2017-12-24 MED FILL — MELOXICAM 7.5 MG TABLET: 7.5 | 15 days supply | Qty: 30 | Fill #2

## 2017-12-24 MED FILL — ?CETIRIZINE HCL 10 MG TABLE: 10 | 30 days supply | Qty: 30 | Fill #1

## 2017-12-28 MED FILL — BENZONATATE 100 MG CAP: 100 | 6 days supply | Qty: 20 | Fill #0

## 2018-01-01 ENCOUNTER — Encounter: Payer: Self-pay | Admitting: Family Medicine

## 2018-01-01 ENCOUNTER — Ambulatory Visit: Payer: Self-pay | Attending: Family Medicine | Admitting: Family Medicine

## 2018-01-01 VITALS — BP 119/73 | HR 90 | Temp 98.2°F | Ht 61.0 in | Wt 121.0 lb

## 2018-01-01 DIAGNOSIS — G4700 Insomnia, unspecified: Secondary | ICD-10-CM | POA: Insufficient documentation

## 2018-01-01 DIAGNOSIS — M545 Low back pain: Secondary | ICD-10-CM | POA: Insufficient documentation

## 2018-01-01 DIAGNOSIS — Z79899 Other long term (current) drug therapy: Secondary | ICD-10-CM | POA: Insufficient documentation

## 2018-01-01 DIAGNOSIS — G4709 Other insomnia: Secondary | ICD-10-CM

## 2018-01-01 DIAGNOSIS — M5416 Radiculopathy, lumbar region: Secondary | ICD-10-CM

## 2018-01-01 DIAGNOSIS — L659 Nonscarring hair loss, unspecified: Secondary | ICD-10-CM | POA: Insufficient documentation

## 2018-01-01 DIAGNOSIS — I10 Essential (primary) hypertension: Secondary | ICD-10-CM | POA: Insufficient documentation

## 2018-01-01 DIAGNOSIS — G8929 Other chronic pain: Secondary | ICD-10-CM | POA: Insufficient documentation

## 2018-01-01 DIAGNOSIS — D573 Sickle-cell trait: Secondary | ICD-10-CM | POA: Insufficient documentation

## 2018-01-01 DIAGNOSIS — R630 Anorexia: Secondary | ICD-10-CM | POA: Insufficient documentation

## 2018-01-01 DIAGNOSIS — M4726 Other spondylosis with radiculopathy, lumbar region: Secondary | ICD-10-CM | POA: Insufficient documentation

## 2018-01-01 DIAGNOSIS — Z9889 Other specified postprocedural states: Secondary | ICD-10-CM | POA: Insufficient documentation

## 2018-01-01 DIAGNOSIS — E785 Hyperlipidemia, unspecified: Secondary | ICD-10-CM | POA: Insufficient documentation

## 2018-01-01 DIAGNOSIS — F17210 Nicotine dependence, cigarettes, uncomplicated: Secondary | ICD-10-CM | POA: Insufficient documentation

## 2018-01-01 DIAGNOSIS — Z634 Disappearance and death of family member: Secondary | ICD-10-CM | POA: Insufficient documentation

## 2018-01-01 DIAGNOSIS — M5116 Intervertebral disc disorders with radiculopathy, lumbar region: Secondary | ICD-10-CM | POA: Insufficient documentation

## 2018-01-01 DIAGNOSIS — J449 Chronic obstructive pulmonary disease, unspecified: Secondary | ICD-10-CM | POA: Insufficient documentation

## 2018-01-01 MED ORDER — AMLODIPINE-VALSARTAN-HCTZ 5-160-12.5 MG PO TABS: 1.0000 | ORAL_TABLET | Freq: Every day | ORAL | 3 refills | Status: DC

## 2018-01-01 MED ORDER — GABAPENTIN 300 MG PO CAPS: 600.0000 mg | ORAL_CAPSULE | Freq: Three times a day (TID) | ORAL | 3 refills | Status: DC

## 2018-01-01 MED ORDER — TRAMADOL HCL 50 MG PO TABS: 50.0000 mg | ORAL_TABLET | Freq: Every day | ORAL | 1 refills | Status: DC

## 2018-01-01 MED ORDER — MIRTAZAPINE 30 MG PO TABS: 30.0000 mg | ORAL_TABLET | Freq: Every day | ORAL | 3 refills | Status: DC

## 2018-01-01 MED FILL — traMADol HCL 50 MG TABS: 50 | 30 days supply | Qty: 30 | Fill #0

## 2018-01-01 MED FILL — ?MIRTAZAPINE 30 MG TABLET: 30 | 30 days supply | Qty: 30 | Fill #0

## 2018-01-01 MED FILL — AMLOD-VALSA-HCTZ 5-160-12.5: 5-160-12.5 | 30 days supply | Qty: 30 | Fill #0

## 2018-01-01 NOTE — Patient Instructions (Signed)
Alopecia Areata, Adult  Alopecia areata is a condition that causes you to lose hair. You may lose hair on your scalp in patches. In some cases, you may lose all the hair on your scalp (alopecia totalis) or all the hair from your face and body (alopecia universalis).  Alopecia areata is an autoimmune disease. This means that your body's defense system (immune system) mistakes normal parts of the body for germs or other things that can make you sick. When you have alopecia areata, the immune system attacks the hair follicles.  Alopecia areata usually develops in childhood, but it can develop at any age. For some people, their hair grows back on its own and hair loss does not happen again. For others, their hair may fall out and grow back in cycles. The hair loss may last many years. Having this condition can be emotionally difficult, but it is not dangerous.  What are the causes?  The cause of this condition is not known.  What increases the risk?  This condition is more likely to develop in people who have:   A family history of alopecia.   A family history of another autoimmune disease, including type 1 diabetes and rheumatoid arthritis.   Asthma and allergies.   Down syndrome.    What are the signs or symptoms?  Round spots of patchy hair loss on the scalp is the main symptom of this condition. The spots may be mildly itchy. Other symptoms include:   Short dark hairs in the bald patches that are wider at the top (exclamation point hairs).   Dents, white spots, or lines in the fingernails or toenails.   Balding and body hair loss. This is rare.    How is this diagnosed?  This condition is diagnosed based on your symptoms and family history. Your health care provider will also check your scalp skin, teeth, and nails. Your health care provider may refer you to a specialist in hair and skin disorders (dermatologist). You may also have tests, including:   A hair pull test.   Blood tests or other screening tests  to check for autoimmune diseases, such as thyroid disease or diabetes.   Skin biopsy to confirm the diagnosis.   A procedure to examine the skin with a lighted magnifying instrument (dermoscopy).    How is this treated?  There is no cure for alopecia areata. Treatment is aimed at promoting the regrowth of hair and preventing the immune system from overreacting. No single treatment is right for all people with alopecia areata. It depends on the type of hair loss you have and how severe it is. Work with your health care provider to find the best treatment for you. Treatment may include:   Having regular checkups to make sure the condition is not getting worse (watchful waiting).   Steroid creams or pills for 6-8 weeks to stop the immune reaction and help hair to regrow more quickly.   Other topical medicines to alter the immune system response and support the hair growth cycle.   Steroid injections.   Therapy and counseling with a support group or therapist if you are having trouble coping with hair loss.    Follow these instructions at home:   Learn as much as you can about your condition.   Apply topical creams only as told by your health care provider.   Take over-the-counter and prescription medicines only as told by your health care provider.   Consider getting a wig or   products to make hair look fuller or to cover bald spots, if you feel uncomfortable with your appearance.   Get therapy or counseling if you are having a hard time coping with hair loss. Ask your health care provider to recommend a counselor or support group.   Keep all follow-up visits as told by your health care provider. This is important.  Contact a health care provider if:   Your hair loss gets worse, even with treatment.   You have new symptoms.   You are struggling emotionally.  Summary   Alopecia areata is an autoimmune condition that makes your body's defense system (immune system) attack the hair follicles. This causes  you to lose hair.   Treatments may include regular checkups to make sure that the condition is not getting worse (watchful waiting), medicines, and steroid injections.  This information is not intended to replace advice given to you by your health care provider. Make sure you discuss any questions you have with your health care provider.  Document Released: 05/03/2004 Document Revised: 10/17/2016 Document Reviewed: 10/17/2016  Elsevier Interactive Patient Education  2018 [employer]vier Inc[employer]

## 2018-01-01 NOTE — Progress Notes (Signed)
Subjective:  Patient ID: Tammy Schwartz, female    DOB: 1956-07-04  Age: 62 y.o. MRN: [id number]  CC: Hypertension   HPI [name] is a 62 year old female with history of hypertension, tobacco abuse, COPD, lumbar spondylosis status post back surgery who presents today for a follow-up visit. She has difficulty sleeping and has had a reduced appetite and was placed on Remeron at her last office visit however she still suffers from these symptoms and notes losing her brother 4 months ago and she had to struggle to obtain the finances for the funeral with no help from her family.  She is currently stressed and complains of falling out of her hair on the central aspect of her head but also uses chemical products on her head.  She complains of low back pain which causes her to cry herself to sleep; gabapentin dose was  increased at her last office visit which helped but pain sometimes causes her leg to give out and she has had a fall as a result.  She continues to smoke 1 pack of cigarettes per day; denies COPD exacerbation, wheezing or shortness of breath.  Past Medical History:  Diagnosis Date  . Chronic back pain    HNp,spondylosis,radiculopathy  . COPD (chronic obstructive pulmonary disease) (HCC)   . Hyperlipidemia    was on choloesterol meds last yr-samples given in office but not needed since  . Hypertension    takes Exforge daily  . Joint pain   . Pneumonia    hx of;many yrs ago  . Shortness of breath    takes Singulair daily;with exertion  . Sickle cell trait (HCC)   . Spondylolysis   . Tobacco use      Outpatient Medications Prior to Visit  Medication Sig Dispense Refill  . acetaminophen (TYLENOL) 325 MG tablet Take 2 tablets (650 mg total) by mouth every 6 (six) hours as needed. 30 tablet 0  . benzonatate (TESSALON) 100 MG capsule TAKE 1 CAPSULE BY MOUTH 3 TIMES DAILY AS NEEDED FOR COUGH. 20 capsule 0  . cetirizine (ZYRTEC) 10 MG tablet Take 1 tablet (10 mg  total) by mouth daily. 30 tablet 11  . diclofenac (VOLTAREN) 75 MG EC tablet Take 1 tablet (75 mg total) by mouth 2 (two) times daily. Prn pain 60 tablet 3  . fluticasone (FLONASE) 50 MCG/ACT nasal spray Place 1 spray into both nostrils daily. 16 g 0  . fluticasone furoate-vilanterol (BREO ELLIPTA) 100-25 MCG/INH AEPB Inhale 1 puff into the lungs daily. 90 each 3  . hydrOXYzine (ATARAX/VISTARIL) 25 MG tablet Take 1 tablet (25 mg total) by mouth every 8 (eight) hours as needed for itching. 60 tablet 2  . ipratropium (ATROVENT) 0.06 % nasal spray Place 2 sprays into both nostrils 4 (four) times daily. 15 mL 1  . lactulose (CHRONULAC) 10 GM/15ML solution Take 15 mLs (10 g total) by mouth 2 (two) times daily as needed for mild constipation. 946 mL 0  . montelukast (SINGULAIR) 10 MG tablet Take 10 mg by mouth daily.     . Multiple Vitamin (MULTIVITAMIN WITH MINERALS) TABS Take 1 tablet by mouth daily.    . ondansetron (ZOFRAN) 4 MG tablet Take 1 tablet (4 mg total) by mouth every 8 (eight) hours as needed for nausea or vomiting. 20 tablet 0  . polyethylene glycol powder (GLYCOLAX/MIRALAX) powder Take 17 g by mouth daily. 3350 g 1  . triamcinolone cream (KENALOG) 0.1 % Apply 1 application topically 2 (two) times  daily. 45 g 1  . VENTOLIN HFA 108 (90 Base) MCG/ACT inhaler Inhale 2 puffs into the lungs every 6 (six) hours as needed. 54 g 3  . Amlodipine-Valsartan-HCTZ (EXFORGE HCT) 5-160-12.5 MG TABS Take 1 tablet by mouth daily. 30 tablet 3  . gabapentin (NEURONTIN) 300 MG capsule Take 2 capsules (600 mg total) by mouth 3 (three) times daily. 180 capsule 3  . mirtazapine (REMERON) 15 MG tablet Take 1 tablet (15 mg total) by mouth at bedtime. 30 tablet 3  . hydrocortisone-pramoxine (PROCTOFOAM HC) rectal foam Place 1 applicator rectally 2 (two) times daily. (Patient not taking: Reported on 01/01/2018) 20 g 1   No facility-administered medications prior to visit.     ROS Review of Systems    Constitutional: Negative for activity change, appetite change and fatigue.  HENT: Negative for congestion, sinus pressure and sore throat.   Eyes: Negative for visual disturbance.  Respiratory: Negative for cough, chest tightness, shortness of breath and wheezing.   Cardiovascular: Negative for chest pain and palpitations.  Gastrointestinal: Negative for abdominal distention, abdominal pain and constipation.  Endocrine: Negative for polydipsia.  Genitourinary: Negative for dysuria and frequency.  Musculoskeletal: Positive for back pain. Negative for arthralgias.  Skin: Negative for rash.  Neurological: Negative for tremors, light-headedness and numbness.  Hematological: Does not bruise/bleed easily.  Psychiatric/Behavioral: Positive for sleep disturbance. Negative for agitation and behavioral problems.    Objective:  BP 119/73   Pulse 90   Temp 98.2 F (36.8 C) (Oral)   Ht 5\' 1"  (1.549 m)   Wt 121 lb (54.9 kg)   LMP 03/17/2012   SpO2 96%   BMI 22.86 kg/m   BP/Weight 01/01/2018 12/10/2017 10/09/2017  Systolic BP 119 116 116  Diastolic BP 73 68 75  Wt. (Lbs) 121 - 120  BMI 22.86 22.67 22.67      Physical Exam  Constitutional: She is oriented to person, place, and time. She appears well-developed and well-nourished.  Cardiovascular: Normal rate, normal heart sounds and intact distal pulses.  No murmur heard. Pulmonary/Chest: Effort normal and breath sounds normal. She has no wheezes. She has no rales. She exhibits no tenderness.  Abdominal: Soft. Bowel sounds are normal. She exhibits no distension and no mass. There is no tenderness.  Musculoskeletal: Normal range of motion. She exhibits tenderness (TTP of lumbar spine; negative straight leg raise b/l).  Neurological: She is alert and oriented to person, place, and time.  Skin: Skin is warm and dry.  Thinning of hair on central aspect of scalp  Psychiatric: She has a normal mood and affect.     Assessment & Plan:   1.  Other insomnia Uncontrolled, exacerbated by recent stress and bereavement Increased dose of Remeron - mirtazapine (REMERON) 30 MG tablet; Take 1 tablet (30 mg total) by mouth at bedtime.  Dispense: 30 tablet; Refill: 3  2. Loss of appetite Hopefully addition of Remeron will help with this - mirtazapine (REMERON) 30 MG tablet; Take 1 tablet (30 mg total) by mouth at bedtime.  Dispense: 30 tablet; Refill: 3  3. Alopecia - TSH - T4, free  4. Essential hypertension Controlled Low-sodium diet - Lipid panel - Comprehensive metabolic panel - amLODIPine-Valsartan-HCTZ (EXFORGE HCT) 5-160-12.5 MG TABS; Take 1 tablet by mouth daily.  Dispense: 30 tablet; Refill: 3  5. Lumbar radiculopathy Uncontrolled Trial of Tramadol - traMADol (ULTRAM) 50 MG tablet; Take 1 tablet (50 mg total) by mouth at bedtime.  Dispense: 30 tablet; Refill: 1 - gabapentin (NEURONTIN) 300 MG  capsule; Take 2 capsules (600 mg total) by mouth 3 (three) times daily.  Dispense: 180 capsule; Refill: 3  6. Chronic obstructive pulmonary disease, unspecified COPD type (HCC) No recent exacerbation   Meds ordered this encounter  Medications  . traMADol (ULTRAM) 50 MG tablet    Sig: Take 1 tablet (50 mg total) by mouth at bedtime.    Dispense:  30 tablet    Refill:  1  . mirtazapine (REMERON) 30 MG tablet    Sig: Take 1 tablet (30 mg total) by mouth at bedtime.    Dispense:  30 tablet    Refill:  3    Discontinue previous dose  . amLODIPine-Valsartan-HCTZ (EXFORGE HCT) 5-160-12.5 MG TABS    Sig: Take 1 tablet by mouth daily.    Dispense:  30 tablet    Refill:  3  . gabapentin (NEURONTIN) 300 MG capsule    Sig: Take 2 capsules (600 mg total) by mouth 3 (three) times daily.    Dispense:  180 capsule    Refill:  3    Follow-up: Return in about 3 months (around 04/03/2018) for Follow-up of chronic medical conditions.   Hoy Register MD

## 2018-01-02 LAB — TSH: TSH: 1.7 u[IU]/mL (ref 0.450–4.500)

## 2018-01-02 LAB — COMPREHENSIVE METABOLIC PANEL
A/G RATIO: 2 (ref 1.2–2.2)
ALK PHOS: 66 IU/L (ref 39–117)
ALT: 22 IU/L (ref 0–32)
AST: 24 IU/L (ref 0–40)
Albumin: 4.3 g/dL (ref 3.6–4.8)
BUN/Creatinine Ratio: 7 — ABNORMAL LOW (ref 12–28)
BUN: 6 mg/dL — ABNORMAL LOW (ref 8–27)
Bilirubin Total: 0.2 mg/dL (ref 0.0–1.2)
CALCIUM: 9.5 mg/dL (ref 8.7–10.3)
CHLORIDE: 106 mmol/L (ref 96–106)
CO2: 23 mmol/L (ref 20–29)
Creatinine, Ser: 0.84 mg/dL (ref 0.57–1.00)
GFR calc Af Amer: 86 mL/min/{1.73_m2} (ref >59)
GFR, EST NON AFRICAN AMERICAN: 75 mL/min/{1.73_m2} (ref >59)
Globulin, Total: 2.1 g/dL (ref 1.5–4.5)
Glucose: 82 mg/dL (ref 65–99)
POTASSIUM: 4.3 mmol/L (ref 3.5–5.2)
SODIUM: 146 mmol/L — AB (ref 134–144)
Total Protein: 6.4 g/dL (ref 6.0–8.5)

## 2018-01-02 LAB — T4, FREE: FREE T4: 0.86 ng/dL (ref 0.82–1.77)

## 2018-01-02 LAB — LIPID PANEL
Chol/HDL Ratio: 2 ratio (ref 0.0–4.4)
Cholesterol, Total: 204 mg/dL — ABNORMAL HIGH (ref 100–199)
HDL: 102 mg/dL (ref >39)
LDL CALC: 84 mg/dL (ref 0–99)
TRIGLYCERIDES: 92 mg/dL (ref 0–149)
VLDL Cholesterol Cal: 18 mg/dL (ref 5–40)

## 2018-01-04 MED FILL — $VENTOLIN HFA 18G INHALER: 108 (90 BAS | 25 days supply | Qty: 18 | Fill #4

## 2018-01-04 MED FILL — $BREO ELLIPTA 100-25 MCG IH: 100-25 MCG | 30 days supply | Qty: 30 | Fill #4

## 2018-01-04 MED FILL — GABAPENTIN 300 MG CAPSULE: 300 | 30 days supply | Qty: 180 | Fill #1

## 2018-01-06 ENCOUNTER — Telehealth: Payer: Self-pay

## 2018-01-06 NOTE — Telephone Encounter (Signed)
Patient was called and informed of lab results. 

## 2018-02-12 ENCOUNTER — Other Ambulatory Visit: Payer: Self-pay | Admitting: Physician Assistant

## 2018-02-12 DIAGNOSIS — L299 Pruritus, unspecified: Secondary | ICD-10-CM

## 2018-02-12 MED FILL — GABAPENTIN 300 MG CAPSULE: 300 | 30 days supply | Qty: 180 | Fill #2

## 2018-02-12 MED FILL — ?MIRTAZAPINE 30 MG TABLET: 30 | 30 days supply | Qty: 30 | Fill #1

## 2018-02-12 MED FILL — ?CETIRIZINE HCL 10 MG TABLE: 10 | 30 days supply | Qty: 30 | Fill #2

## 2018-02-12 MED FILL — traMADol HCL 50 MG TABS: 50 | 30 days supply | Qty: 30 | Fill #1

## 2018-02-12 MED FILL — AMLOD-VALSA-HCTZ 5-160-12.5: 5-160-12.5 | 30 days supply | Qty: 30 | Fill #1

## 2018-02-17 ENCOUNTER — Other Ambulatory Visit: Payer: Self-pay

## 2018-02-17 ENCOUNTER — Telehealth: Payer: Self-pay | Admitting: Family Medicine

## 2018-02-17 DIAGNOSIS — L299 Pruritus, unspecified: Secondary | ICD-10-CM

## 2018-02-17 MED ORDER — HYDROXYZINE HCL 25 MG PO TABS: 25.0000 mg | ORAL_TABLET | Freq: Three times a day (TID) | ORAL | 0 refills | Status: DC | PRN

## 2018-02-17 MED FILL — $BREO ELLIPTA 100-25 MCG IH: 100-25 MCG | 90 days supply | Qty: 180 | Fill #5

## 2018-02-17 MED FILL — $VENTOLIN HFA 18G INHALER: 108 (90 BAS | 75 days supply | Qty: 54 | Fill #5

## 2018-02-17 MED FILL — hydrOXYzine HCL 25 MG TABS: 25 | 20 days supply | Qty: 60 | Fill #0

## 2018-02-17 NOTE — Telephone Encounter (Signed)
Pt came in to request a medication refill  -hydrOXYzine (ATARAX/VISTARIL) 25 MG tablet  To [facility] pharmacy Please follow up

## 2018-02-18 NOTE — Telephone Encounter (Signed)
Addressed.

## 2018-03-22 ENCOUNTER — Other Ambulatory Visit: Payer: Self-pay | Admitting: Family Medicine

## 2018-03-22 DIAGNOSIS — M5416 Radiculopathy, lumbar region: Secondary | ICD-10-CM

## 2018-03-22 MED ORDER — TRAMADOL HCL 50 MG PO TABS: 50.0000 mg | ORAL_TABLET | Freq: Every day | ORAL | 1 refills | Status: DC

## 2018-03-22 MED FILL — hydrOXYzine HCL 25 MG TABS: 25 | 20 days supply | Qty: 60 | Fill #0

## 2018-03-22 MED FILL — AMLOD-VALSA-HCTZ 5-160-12.5: 5-160-12.5 | 30 days supply | Qty: 30 | Fill #2

## 2018-03-22 MED FILL — GABAPENTIN 300 MG CAPSULE: 300 | 30 days supply | Qty: 180 | Fill #3

## 2018-03-22 MED FILL — ?MIRTAZAPINE 30 MG TABLET: 30 | 30 days supply | Qty: 30 | Fill #2

## 2018-03-22 MED FILL — ?CETIRIZINE HCL 10 MG TABLE: 10 | 30 days supply | Qty: 30 | Fill #3

## 2018-03-24 ENCOUNTER — Other Ambulatory Visit: Payer: Self-pay | Admitting: Internal Medicine

## 2018-03-24 DIAGNOSIS — J069 Acute upper respiratory infection, unspecified: Secondary | ICD-10-CM

## 2018-03-24 MED ORDER — TRAMADOL HCL 50 MG PO TABS: 50.0000 mg | ORAL_TABLET | Freq: Every day | ORAL | 1 refills | Status: DC

## 2018-03-30 ENCOUNTER — Other Ambulatory Visit: Payer: Self-pay | Admitting: Internal Medicine

## 2018-03-30 DIAGNOSIS — J069 Acute upper respiratory infection, unspecified: Secondary | ICD-10-CM

## 2018-03-31 ENCOUNTER — Other Ambulatory Visit: Payer: Self-pay | Admitting: Family Medicine

## 2018-03-31 ENCOUNTER — Ambulatory Visit: Payer: Self-pay | Attending: Family Medicine | Admitting: Family Medicine

## 2018-03-31 ENCOUNTER — Encounter: Payer: Self-pay | Admitting: Family Medicine

## 2018-03-31 ENCOUNTER — Other Ambulatory Visit: Payer: Self-pay | Admitting: Internal Medicine

## 2018-03-31 VITALS — BP 118/71 | HR 90 | Temp 97.6°F | Ht 61.0 in | Wt 122.0 lb

## 2018-03-31 DIAGNOSIS — M25561 Pain in right knee: Secondary | ICD-10-CM | POA: Insufficient documentation

## 2018-03-31 DIAGNOSIS — E785 Hyperlipidemia, unspecified: Secondary | ICD-10-CM | POA: Insufficient documentation

## 2018-03-31 DIAGNOSIS — M5416 Radiculopathy, lumbar region: Secondary | ICD-10-CM | POA: Insufficient documentation

## 2018-03-31 DIAGNOSIS — J069 Acute upper respiratory infection, unspecified: Secondary | ICD-10-CM

## 2018-03-31 DIAGNOSIS — Z6823 Body mass index (BMI) 23.0-23.9, adult: Secondary | ICD-10-CM | POA: Insufficient documentation

## 2018-03-31 DIAGNOSIS — G4709 Other insomnia: Secondary | ICD-10-CM

## 2018-03-31 DIAGNOSIS — G8929 Other chronic pain: Secondary | ICD-10-CM | POA: Insufficient documentation

## 2018-03-31 DIAGNOSIS — J449 Chronic obstructive pulmonary disease, unspecified: Secondary | ICD-10-CM | POA: Insufficient documentation

## 2018-03-31 DIAGNOSIS — M25562 Pain in left knee: Secondary | ICD-10-CM | POA: Insufficient documentation

## 2018-03-31 DIAGNOSIS — L299 Pruritus, unspecified: Secondary | ICD-10-CM | POA: Insufficient documentation

## 2018-03-31 DIAGNOSIS — G4700 Insomnia, unspecified: Secondary | ICD-10-CM | POA: Insufficient documentation

## 2018-03-31 DIAGNOSIS — Z79899 Other long term (current) drug therapy: Secondary | ICD-10-CM | POA: Insufficient documentation

## 2018-03-31 DIAGNOSIS — D573 Sickle-cell trait: Secondary | ICD-10-CM | POA: Insufficient documentation

## 2018-03-31 DIAGNOSIS — R630 Anorexia: Secondary | ICD-10-CM | POA: Insufficient documentation

## 2018-03-31 DIAGNOSIS — I10 Essential (primary) hypertension: Secondary | ICD-10-CM | POA: Insufficient documentation

## 2018-03-31 MED ORDER — HYDROXYZINE HCL 25 MG PO TABS: 25.0000 mg | ORAL_TABLET | Freq: Three times a day (TID) | ORAL | 2 refills | Status: DC | PRN

## 2018-03-31 MED ORDER — MIRTAZAPINE 30 MG PO TABS: 30.0000 mg | ORAL_TABLET | Freq: Every day | ORAL | 3 refills | Status: DC

## 2018-03-31 MED ORDER — DICLOFENAC SODIUM 75 MG PO TBEC: 75.0000 mg | DELAYED_RELEASE_TABLET | Freq: Two times a day (BID) | ORAL | 3 refills | Status: DC

## 2018-03-31 MED ORDER — FLUTICASONE FUROATE-VILANTEROL 100-25 MCG/INH IN AEPB: 1.0000 | INHALATION_SPRAY | Freq: Every day | RESPIRATORY_TRACT | 3 refills | Status: DC

## 2018-03-31 MED ORDER — TIZANIDINE HCL 4 MG PO TABS: 4.0000 mg | ORAL_TABLET | Freq: Three times a day (TID) | ORAL | 3 refills | Status: DC | PRN

## 2018-03-31 MED ORDER — LISINOPRIL-HYDROCHLOROTHIAZIDE 20-12.5 MG PO TABS: 2.0000 | ORAL_TABLET | Freq: Every day | ORAL | 6 refills | Status: DC

## 2018-03-31 MED ORDER — DULOXETINE HCL 60 MG PO CPEP
60.0000 mg | ORAL_CAPSULE | Freq: Every day | ORAL | 3 refills | Status: DC
Start: 2018-03-31 — End: 2018-07-14

## 2018-03-31 MED ORDER — GABAPENTIN 300 MG PO CAPS: 600.0000 mg | ORAL_CAPSULE | Freq: Three times a day (TID) | ORAL | 3 refills | Status: DC

## 2018-03-31 MED ORDER — FLUTICASONE PROPIONATE 50 MCG/ACT NA SUSP: 1.0000 | Freq: Every day | NASAL | 0 refills | Status: DC

## 2018-03-31 MED ORDER — TRAMADOL HCL 50 MG PO TABS: 50.0000 mg | ORAL_TABLET | Freq: Every day | ORAL | 1 refills | Status: DC

## 2018-03-31 MED FILL — ?TIZANIDINE HCL 4MG TABLETS: 4 | 30 days supply | Qty: 90 | Fill #0

## 2018-03-31 MED FILL — ?LISINOPRIL-HCTZ 20/12.5 TA: 20-12.5 | 30 days supply | Qty: 60 | Fill #0

## 2018-03-31 MED FILL — FLUTICASONE PROP 50 MCG SPR: 50 | 30 days supply | Qty: 16 | Fill #0

## 2018-03-31 MED FILL — ?DULoxetine HCL 6OMG CP: 60 | 30 days supply | Qty: 30 | Fill #0

## 2018-03-31 MED FILL — DICLOFENAC SOD EC 75 MG TAB: 75 | 30 days supply | Qty: 60 | Fill #0

## 2018-03-31 MED FILL — traMADol HCL 50 MG TABS: 50 | 30 days supply | Qty: 30 | Fill #0

## 2018-03-31 NOTE — Patient Instructions (Signed)

## 2018-03-31 NOTE — Progress Notes (Signed)
Subjective:  Patient ID: Tammy MacadamVerna S Lapid, female    DOB: 03/08/1956  Age: 62 y.o. MRN: [id number]  CC: Hypertension   HPI Tammy Schwartz is a 62 year old female with history of hypertension, tobacco abuse, COPD, lumbar spondylosis status post back surgery who presents today for a follow-up visit. Her back pain is uncontrolled on her current regimen with complains of her legs giving out.  Pain is described as severe, throbbing and causes her to lose her balance.  Denies numbness in her leg but pain sometimes radiates down her right lower extremity; informs me she has had 4 back surgeries in the past and is not interested in any other surgical options. She complains of intermittent swelling of her ankles even though at this time there is no evident swelling.  Reports doing well on amlodipine which she takes for hypertension. Denies COPD exacerbation is tolerating her inhalers. With regards to healthcare maintenance she states she is up-to-date on her colonoscopy which she had with [place] gastroenterology [place] [address]. Her insomnia and decreased appetite have improved since commencement on Remeron.  Past Medical History:  Diagnosis Date  . Chronic back pain    HNp,spondylosis,radiculopathy  . COPD (chronic obstructive pulmonary disease) (HCC)   . Hyperlipidemia    was on choloesterol meds last yr-samples given in office but not needed since  . Hypertension    takes Exforge daily  . Joint pain   . Pneumonia    hx of;many yrs ago  . Shortness of breath    takes Singulair daily;with exertion  . Sickle cell trait (HCC)   . Spondylolysis   . Tobacco use     Past Surgical History:  Procedure Laterality Date  . BACK SURGERY    . BREAST EXCISIONAL BIOPSY Left 2007   benign  . BREAST SURGERY Left    lumpectomy  . COLONOSCOPY    . DILATION AND CURETTAGE OF UTERUS    . LUMBAR LAMINECTOMY/DECOMPRESSION MICRODISCECTOMY  06/04/2012   Procedure: LUMBAR LAMINECTOMY/DECOMPRESSION  MICRODISCECTOMY 1 LEVEL;  Surgeon: Carmela HurtKyle L Cabbell, MD;  Location: [facility];  Service: Neurosurgery;  Laterality: Left;  LEFT Lumbar five-sacral one diskectomy  . LUMBAR LAMINECTOMY/DECOMPRESSION MICRODISCECTOMY Right 06/16/2013   Procedure: RIGHT Lumbar Five-Sacral One Microdiskectomy;  Surgeon: Carmela HurtKyle L Cabbell, MD;  Location: [facility];  Service: Neurosurgery;  Laterality: Right;  RIGHT Lumbar Five-Sacral One Microdiskectomy  . LUMBAR LAMINECTOMY/DECOMPRESSION MICRODISCECTOMY Right 01/20/2014   Procedure: LUMBAR FOUR TO LUMBAR FIVE LUMBAR LAMINECTOMY/DECOMPRESSION MICRODISCECTOMY 1 LEVEL;  Surgeon: Carmela HurtKyle L Cabbell, MD;  Location: [facility];  Service: Neurosurgery;  Laterality: Right;  Right L45 laminectomy and foramintomy    No Known Allergies   Outpatient Medications Prior to Visit  Medication Sig Dispense Refill  . acetaminophen (TYLENOL) 325 MG tablet Take 2 tablets (650 mg total) by mouth every 6 (six) hours as needed. 30 tablet 0  . cetirizine (ZYRTEC) 10 MG tablet Take 1 tablet (10 mg total) by mouth daily. 30 tablet 11  . ipratropium (ATROVENT) 0.06 % nasal spray Place 2 sprays into both nostrils 4 (four) times daily. 15 mL 1  . lactulose (CHRONULAC) 10 GM/15ML solution Take 15 mLs (10 g total) by mouth 2 (two) times daily as needed for mild constipation. 946 mL 0  . montelukast (SINGULAIR) 10 MG tablet Take 10 mg by mouth daily.     . Multiple Vitamin (MULTIVITAMIN WITH MINERALS) TABS Take 1 tablet by mouth daily.    . polyethylene glycol powder (GLYCOLAX/MIRALAX)  powder Take 17 g by mouth daily. 3350 g 1  . triamcinolone cream (KENALOG) 0.1 % Apply 1 application topically 2 (two) times daily. 45 g 1  . VENTOLIN HFA 108 (90 Base) MCG/ACT inhaler Inhale 2 puffs into the lungs every 6 (six) hours as needed. 54 g 3  . amLODIPine-Valsartan-HCTZ (EXFORGE HCT) 5-160-12.5 MG TABS Take 1 tablet by mouth daily. 30 tablet 3  . diclofenac (VOLTAREN) 75 MG EC tablet Take 1 tablet (75 mg  total) by mouth 2 (two) times daily. Prn pain 60 tablet 3  . fluticasone (FLONASE) 50 MCG/ACT nasal spray Place 1 spray into both nostrils daily. 16 g 0  . fluticasone furoate-vilanterol (BREO ELLIPTA) 100-25 MCG/INH AEPB Inhale 1 puff into the lungs daily. 90 each 3  . gabapentin (NEURONTIN) 300 MG capsule Take 2 capsules (600 mg total) by mouth 3 (three) times daily. 180 capsule 3  . hydrOXYzine (ATARAX/VISTARIL) 25 MG tablet TAKE 1 TABLET BY MOUTH EVERY 8 HOURS AS NEEDED FOR ITCHING. 60 tablet 2  . hydrOXYzine (ATARAX/VISTARIL) 25 MG tablet Take 1 tablet (25 mg total) by mouth every 8 (eight) hours as needed for itching. 60 tablet 0  . mirtazapine (REMERON) 30 MG tablet Take 1 tablet (30 mg total) by mouth at bedtime. 30 tablet 3  . ondansetron (ZOFRAN) 4 MG tablet Take 1 tablet (4 mg total) by mouth every 8 (eight) hours as needed for nausea or vomiting. 20 tablet 0  . traMADol (ULTRAM) 50 MG tablet Take 1 tablet (50 mg total) by mouth at bedtime. 30 tablet 1  . hydrocortisone-pramoxine (PROCTOFOAM HC) rectal foam Place 1 applicator rectally 2 (two) times daily. (Patient not taking: Reported on 01/01/2018) 20 g 1  . benzonatate (TESSALON) 100 MG capsule TAKE 1 CAPSULE BY MOUTH 3 TIMES DAILY AS NEEDED FOR COUGH. (Patient not taking: Reported on 03/31/2018) 20 capsule 0   No facility-administered medications prior to visit.     ROS Review of Systems  Constitutional: Negative for activity change, appetite change and fatigue.  HENT: Negative for congestion, sinus pressure and sore throat.   Eyes: Negative for visual disturbance.  Respiratory: Negative for cough, chest tightness, shortness of breath and wheezing.   Cardiovascular: Negative for chest pain and palpitations.  Gastrointestinal: Negative for abdominal distention, abdominal pain and constipation.  Endocrine: Negative for polydipsia.  Genitourinary: Negative for dysuria and frequency.  Musculoskeletal:       See hpi  Skin: Negative  for rash.  Neurological: Negative for tremors, light-headedness and numbness.  Hematological: Does not bruise/bleed easily.  Psychiatric/Behavioral: Negative for agitation and behavioral problems.    Objective:  BP 118/71   Pulse 90   Temp 97.6 F (36.4 C) (Oral)   Ht 5\' 1"  (1.549 m)   Wt 122 lb (55.3 kg)   LMP 03/17/2012   SpO2 98%   BMI 23.05 kg/m   BP/Weight 03/31/2018 01/01/2018 12/10/2017  Systolic BP 118 119 116  Diastolic BP 71 73 68  Wt. (Lbs) 122 121 -  BMI 23.05 22.86 22.67     Physical Exam  Constitutional: She is oriented to person, place, and time. She appears well-developed and well-nourished.  Cardiovascular: Normal rate, normal heart sounds and intact distal pulses.  No murmur heard. Pulmonary/Chest: Effort normal and breath sounds normal. She has no wheezes. She has no rales. She exhibits no tenderness.  Abdominal: Soft. Bowel sounds are normal. She exhibits no distension and no mass. There is no tenderness.  Musculoskeletal:  Bulging lumbar spine  with tenderness to palpation.  Negative straight leg raise bilaterally  Neurological: She is alert and oriented to person, place, and time. She displays normal reflexes. She exhibits normal muscle tone. Coordination normal.  Skin: Skin is warm and dry.  Psychiatric: She has a normal mood and affect.     Assessment & Plan:   1. Chronic pain of both knees Stable - diclofenac (VOLTAREN) 75 MG EC tablet; Take 1 tablet (75 mg total) by mouth 2 (two) times daily. Prn pain  Dispense: 60 tablet; Refill: 3  2. Chronic obstructive pulmonary disease, unspecified COPD type (HCC) No acute exacerbation - fluticasone furoate-vilanterol (BREO ELLIPTA) 100-25 MCG/INH AEPB; Inhale 1 puff into the lungs daily.  Dispense: 90 each; Refill: 3  3. Lumbar radiculopathy Status post previous back surgery Uncontrolled Cymbalta and tizanidine added to regimen She declines referral for epidural steroid injection - DULoxetine  (CYMBALTA) 60 MG capsule; Take 1 capsule (60 mg total) by mouth daily.  Dispense: 30 capsule; Refill: 3 - gabapentin (NEURONTIN) 300 MG capsule; Take 2 capsules (600 mg total) by mouth 3 (three) times daily.  Dispense: 180 capsule; Refill: 3 - traMADol (ULTRAM) 50 MG tablet; Take 1 tablet (50 mg total) by mouth at bedtime.  Dispense: 30 tablet; Refill: 1 - tiZANidine (ZANAFLEX) 4 MG tablet; Take 1 tablet (4 mg total) by mouth every 8 (eight) hours as needed for muscle spasms.  Dispense: 90 tablet; Refill: 3  4. Pruritus Unknown etiology - hydrOXYzine (ATARAX/VISTARIL) 25 MG tablet; Take 1 tablet (25 mg total) by mouth every 8 (eight) hours as needed.  Dispense: 90 tablet; Refill: 2  5. Other insomnia Improved on Remeron - mirtazapine (REMERON) 30 MG tablet; Take 1 tablet (30 mg total) by mouth at bedtime.  Dispense: 30 tablet; Refill: 3  6. Loss of appetite Improved on Remeron - mirtazapine (REMERON) 30 MG tablet; Take 1 tablet (30 mg total) by mouth at bedtime.  Dispense: 30 tablet; Refill: 3  7. Essential hypertension Controlled Discontinue amlodipine and commence lisinopril/HCTZ due to complaints of pedal edema Counseled on blood pressure goal of less than 130/80, low-sodium, DASH diet, medication compliance, 150 minutes of moderate intensity exercise per week. Discussed medication compliance, adverse effects. - lisinopril-hydrochlorothiazide (ZESTORETIC) 20-12.5 MG tablet; Take 2 tablets by mouth daily.  Dispense: 60 tablet; Refill: 6   Meds ordered this encounter  Medications  . lisinopril-hydrochlorothiazide (ZESTORETIC) 20-12.5 MG tablet    Sig: Take 2 tablets by mouth daily.    Dispense:  60 tablet    Refill:  6    Discontinue Exforge/HCTZ  . DULoxetine (CYMBALTA) 60 MG capsule    Sig: Take 1 capsule (60 mg total) by mouth daily.    Dispense:  30 capsule    Refill:  3  . diclofenac (VOLTAREN) 75 MG EC tablet    Sig: Take 1 tablet (75 mg total) by mouth 2 (two) times  daily. Prn pain    Dispense:  60 tablet    Refill:  3  . fluticasone (FLONASE) 50 MCG/ACT nasal spray    Sig: Place 1 spray into both nostrils daily.    Dispense:  16 g    Refill:  0  . fluticasone furoate-vilanterol (BREO ELLIPTA) 100-25 MCG/INH AEPB    Sig: Inhale 1 puff into the lungs daily.    Dispense:  90 each    Refill:  3  . gabapentin (NEURONTIN) 300 MG capsule    Sig: Take 2 capsules (600 mg total) by mouth 3 (three) times  daily.    Dispense:  180 capsule    Refill:  3  . hydrOXYzine (ATARAX/VISTARIL) 25 MG tablet    Sig: Take 1 tablet (25 mg total) by mouth every 8 (eight) hours as needed.    Dispense:  90 tablet    Refill:  2  . mirtazapine (REMERON) 30 MG tablet    Sig: Take 1 tablet (30 mg total) by mouth at bedtime.    Dispense:  30 tablet    Refill:  3    Discontinue previous dose  . traMADol (ULTRAM) 50 MG tablet    Sig: Take 1 tablet (50 mg total) by mouth at bedtime.    Dispense:  30 tablet    Refill:  1  . tiZANidine (ZANAFLEX) 4 MG tablet    Sig: Take 1 tablet (4 mg total) by mouth every 8 (eight) hours as needed for muscle spasms.    Dispense:  90 tablet    Refill:  3    Follow-up: Return in about 3 months (around 07/01/2018) for Follow-up of chronic medical conditions.   Hoy Register MD

## 2018-04-01 ENCOUNTER — Encounter: Payer: Self-pay | Admitting: Family Medicine

## 2018-04-01 MED FILL — TRIAMCINOLONE 0.1% CREAM: 0.1 | 20 days supply | Qty: 45 | Fill #0

## 2018-04-05 ENCOUNTER — Ambulatory Visit: Payer: No Typology Code available for payment source | Admitting: Family Medicine

## 2018-04-09 ENCOUNTER — Telehealth: Payer: Self-pay | Admitting: Family Medicine

## 2018-04-09 NOTE — Telephone Encounter (Signed)
I  Called Pt to informed her that she need to schedule a Financial appt. Since her CAFA exo 02/17/18 and the bill she brought is for DOS 03/31/18 and the [facility] won't cover the visit, so to please schedule an appt with financial

## 2018-04-13 NOTE — Telephone Encounter (Signed)
Patient called stating that she was prescribed benzonatate (TESSALON) 100 MG capsule and she still has the cough and would like a refill on the medication. Patient uses [facility]. Please f/u

## 2018-04-14 ENCOUNTER — Other Ambulatory Visit: Payer: Self-pay

## 2018-04-14 MED ORDER — BENZONATATE 100 MG PO CAPS: 100.0000 mg | ORAL_CAPSULE | Freq: Two times a day (BID) | ORAL | 0 refills | Status: DC | PRN

## 2018-04-14 MED FILL — BENZONATATE 100 MG CAP: 100 | 10 days supply | Qty: 20 | Fill #0

## 2018-04-14 NOTE — Telephone Encounter (Signed)
Done

## 2018-04-23 ENCOUNTER — Ambulatory Visit: Payer: No Typology Code available for payment source | Attending: Family Medicine

## 2018-05-14 ENCOUNTER — Encounter (HOSPITAL_COMMUNITY): Payer: Self-pay

## 2018-05-14 ENCOUNTER — Ambulatory Visit (HOSPITAL_COMMUNITY)
Admission: EM | Admit: 2018-05-14 | Discharge: 2018-05-14 | Disposition: A | Discharge status: Home / Self Care | Payer: No Typology Code available for payment source | Attending: Internal Medicine | Admitting: Internal Medicine

## 2018-05-14 ENCOUNTER — Other Ambulatory Visit: Payer: Self-pay | Admitting: Family Medicine

## 2018-05-14 DIAGNOSIS — L03115 Cellulitis of right lower limb: Secondary | ICD-10-CM

## 2018-05-14 DIAGNOSIS — M79671 Pain in right foot: Secondary | ICD-10-CM

## 2018-05-14 DIAGNOSIS — M7989 Other specified soft tissue disorders: Secondary | ICD-10-CM

## 2018-05-14 DIAGNOSIS — R2241 Localized swelling, mass and lump, right lower limb: Secondary | ICD-10-CM

## 2018-05-14 MED ORDER — DOXYCYCLINE HYCLATE 100 MG PO CAPS: 100.0000 mg | ORAL_CAPSULE | Freq: Two times a day (BID) | ORAL | 0 refills | Status: DC

## 2018-05-14 MED FILL — GABAPENTIN 300 MG CAPSULE: 300 | 30 days supply | Qty: 180 | Fill #0

## 2018-05-14 MED FILL — ?MIRTAZAPINE 30 MG TABLET: 30 | 30 days supply | Qty: 30 | Fill #3

## 2018-05-14 MED FILL — ?CETIRIZINE HCL 10 MG TABLE: 10 | 30 days supply | Qty: 30 | Fill #4

## 2018-05-14 MED FILL — tiZANidine HCL 4 MG TABS: 4 | 30 days supply | Qty: 90 | Fill #1

## 2018-05-14 MED FILL — BENZONATATE 100 MG CAP: 100 | 10 days supply | Qty: 20 | Fill #0

## 2018-05-14 NOTE — Discharge Instructions (Signed)
Rest and push fluids Prescribed doxycycline.  Take as prescribed and to completion Continue taking diclofenac as prescribed as needed for pain and inflammation Follow up with PCP next week if symptoms persist Return or go to the ER if you have any new or worsening symptoms (fever, chills, chest pain, abdominal pain, changes in bowel or bladder habits, pain radiating into lower legs, etc...)

## 2018-05-14 NOTE — ED Provider Notes (Signed)
[facility]   [id number] 05/14/18 Arrival Time: 1513  SUBJECTIVE: History from: patient. Tammy Schwartz is a 62 y.o. female complains of right foot pain that began 2 days ago.  Denies a precipitating event or specific injury.  Localizes the pain to the top of foot.  Describes the pain as constant and throbbing in character.  Has tried OTC medications without relief.  Symptoms are made worse to the touch.  Reports similar symptoms in the past.  Complains of associated subjective fever, chills, erythema and swelling.  Denies ecchymosis, weakness, numbness and tingling.      ROS: As per HPI.  Past Medical History:  Diagnosis Date  . Chronic back pain    HNp,spondylosis,radiculopathy  . COPD (chronic obstructive pulmonary disease) (HCC)   . Hyperlipidemia    was on choloesterol meds last yr-samples given in office but not needed since  . Hypertension    takes Exforge daily  . Joint pain   . Pneumonia    hx of;many yrs ago  . Shortness of breath    takes Singulair daily;with exertion  . Sickle cell trait (HCC)   . Spondylolysis   . Tobacco use    Past Surgical History:  Procedure Laterality Date  . BACK SURGERY    . BREAST EXCISIONAL BIOPSY Left 2007   benign  . BREAST SURGERY Left    lumpectomy  . COLONOSCOPY    . DILATION AND CURETTAGE OF UTERUS    . LUMBAR LAMINECTOMY/DECOMPRESSION MICRODISCECTOMY  06/04/2012   Procedure: LUMBAR LAMINECTOMY/DECOMPRESSION MICRODISCECTOMY 1 LEVEL;  Surgeon: Carmela Hurt, MD;  Location: [facility];  Service: Neurosurgery;  Laterality: Left;  LEFT Lumbar five-sacral one diskectomy  . LUMBAR LAMINECTOMY/DECOMPRESSION MICRODISCECTOMY Right 06/16/2013   Procedure: RIGHT Lumbar Five-Sacral One Microdiskectomy;  Surgeon: Carmela Hurt, MD;  Location: [facility];  Service: Neurosurgery;  Laterality: Right;  RIGHT Lumbar Five-Sacral One Microdiskectomy  . LUMBAR LAMINECTOMY/DECOMPRESSION MICRODISCECTOMY Right 01/20/2014   Procedure:  LUMBAR FOUR TO LUMBAR FIVE LUMBAR LAMINECTOMY/DECOMPRESSION MICRODISCECTOMY 1 LEVEL;  Surgeon: Carmela Hurt, MD;  Location: [facility];  Service: Neurosurgery;  Laterality: Right;  Right L45 laminectomy and foramintomy   No Known Allergies No current facility-administered medications on file prior to encounter.    Current Outpatient Medications on File Prior to Encounter  Medication Sig Dispense Refill  . acetaminophen (TYLENOL) 325 MG tablet Take 2 tablets (650 mg total) by mouth every 6 (six) hours as needed. 30 tablet 0  . benzonatate (TESSALON) 100 MG capsule TAKE 1 CAPSULE BY MOUTH 2 (TWO) TIMES DAILY AS NEEDED FOR COUGH. 20 capsule 0  . cetirizine (ZYRTEC) 10 MG tablet Take 1 tablet (10 mg total) by mouth daily. 30 tablet 11  . diclofenac (VOLTAREN) 75 MG EC tablet Take 1 tablet (75 mg total) by mouth 2 (two) times daily. Prn pain 60 tablet 3  . DULoxetine (CYMBALTA) 60 MG capsule Take 1 capsule (60 mg total) by mouth daily. 30 capsule 3  . fluticasone (FLONASE) 50 MCG/ACT nasal spray Place 1 spray into both nostrils daily. 16 g 0  . fluticasone furoate-vilanterol (BREO ELLIPTA) 100-25 MCG/INH AEPB Inhale 1 puff into the lungs daily. 90 each 3  . gabapentin (NEURONTIN) 300 MG capsule Take 2 capsules (600 mg total) by mouth 3 (three) times daily. 180 capsule 3  . hydrocortisone-pramoxine (PROCTOFOAM HC) rectal foam Place 1 applicator rectally 2 (two) times daily. (Patient not taking: Reported on 01/01/2018) 20 g 1  . hydrOXYzine (ATARAX/VISTARIL) 25  MG tablet Take 1 tablet (25 mg total) by mouth every 8 (eight) hours as needed. 90 tablet 2  . ipratropium (ATROVENT) 0.06 % nasal spray Place 2 sprays into both nostrils 4 (four) times daily. 15 mL 1  . lactulose (CHRONULAC) 10 GM/15ML solution Take 15 mLs (10 g total) by mouth 2 (two) times daily as needed for mild constipation. 946 mL 0  . lisinopril-hydrochlorothiazide (ZESTORETIC) 20-12.5 MG tablet Take 2 tablets by mouth daily. 60 tablet  6  . mirtazapine (REMERON) 30 MG tablet Take 1 tablet (30 mg total) by mouth at bedtime. 30 tablet 3  . montelukast (SINGULAIR) 10 MG tablet Take 10 mg by mouth daily.     . Multiple Vitamin (MULTIVITAMIN WITH MINERALS) TABS Take 1 tablet by mouth daily.    . polyethylene glycol powder (GLYCOLAX/MIRALAX) powder Take 17 g by mouth daily. 3350 g 1  . tiZANidine (ZANAFLEX) 4 MG tablet Take 1 tablet (4 mg total) by mouth every 8 (eight) hours as needed for muscle spasms. 90 tablet 3  . traMADol (ULTRAM) 50 MG tablet Take 1 tablet (50 mg total) by mouth at bedtime. 30 tablet 1  . triamcinolone cream (KENALOG) 0.1 % APPLY 1 APPLICATION TOPICALLY 2 TIMES DAILY. 45 g 1  . VENTOLIN HFA 108 (90 Base) MCG/ACT inhaler Inhale 2 puffs into the lungs every 6 (six) hours as needed. 54 g 3   Social History   Socioeconomic History  . Marital status: Married    Spouse name: Not on file  . Number of children: Not on file  . Years of education: Not on file  . Highest education level: Not on file  Occupational History  . Not on file  Social Needs  . Financial resource strain: Not on file  . Food insecurity:    Worry: Not on file    Inability: Not on file  . Transportation needs:    Medical: Not on file    Non-medical: Not on file  Tobacco Use  . Smoking status: Current Every Day Smoker    Packs/day: 0.25    Years: 20.00    Pack years: 5.00    Types: Cigarettes  . Smokeless tobacco: Never Used  Substance and Sexual Activity  . Alcohol use: No  . Drug use: No  . Sexual activity: Yes    Birth control/protection: Post-menopausal  Lifestyle  . Physical activity:    Days per week: 0 days    Minutes per session: 0 min  . Stress: Only a little  Relationships  . Social connections:    Talks on phone: More than three times a week    Gets together: Once a week    Attends religious service: More than 4 times per year    Active member of club or organization: No    Attends meetings of clubs or  organizations: Never    Relationship status: Married  . Intimate partner violence:    Fear of current or ex partner: No    Emotionally abused: No    Physically abused: No    Forced sexual activity: No  Other Topics Concern  . Not on file  Social History Narrative  . Not on file   Family History  Problem Relation Age of Onset  . Cancer - Other Mother   . CAD Father   . Hypertension Sister     OBJECTIVE:  Vitals:   05/14/18 1612  BP: 132/70  Pulse: 99  Resp: 18  Temp: 98 F (36.7  C)  SpO2: 99%    General appearance: AOx3; in no acute distress. Appears uncomfortable  Head: NCAT  Lungs: CTA bilaterally Heart: RRR.  Clear S1 and S2 without murmur, gallops, or rubs.  Musculoskeletal: Right foot Inspection: Skin warm, dry, clear and intact without obvious ecchymosis. Localized effusion and erythema to the distal dorsum of her right foot.  Small erythematous papule localized to the distal lateral aspect of right foot Palpation: Tender to palpation about the distal dorsum of foot ROM: FROM active and passive; wiggles toes without difficulty Strength: 5/5 knee flexion, 5/5 knee extension, 5/5 dorsiflexion, 5/5 plantar flexion Skin: warm and dry Neurologic: Ambulates without difficulty; Sensation intact about the lower extremities Psychological: alert and cooperative; normal mood and affect  ASSESSMENT & PLAN:  1. Foot swelling   2. Cellulitis of right lower extremity     @NIMG @  No orders of the defined types were placed in this encounter.  Rest and push fluids Prescribed doxycycline.  Take as prescribed and to completion Continue taking diclofenac as prescribed as needed for pain and inflammation Follow up with PCP next week if symptoms persist Return or go to the ER if you have any new or worsening symptoms (fever, chills, chest pain, abdominal pain, changes in bowel or bladder habits, pain radiating into lower legs, etc...)   Reviewed expectations re: course of  current medical issues. Questions answered. Outlined signs and symptoms indicating need for more acute intervention. Patient verbalized understanding. After Visit Summary given.    Rennis Harding, PA-C 05/14/18 1704    Arby Barrette, MD 06/01/18 [phone]

## 2018-05-14 NOTE — ED Triage Notes (Signed)
Pt presents with right foot pain. Denies any injury.

## 2018-05-15 ENCOUNTER — Telehealth (HOSPITAL_COMMUNITY): Payer: Self-pay | Admitting: Emergency Medicine

## 2018-05-15 MED ORDER — BENZONATATE 100 MG PO CAPS: ORAL_CAPSULE | ORAL | 0 refills | Status: DC

## 2018-05-15 MED ORDER — DOXYCYCLINE HYCLATE 100 MG PO CAPS: 100.0000 mg | ORAL_CAPSULE | Freq: Two times a day (BID) | ORAL | 0 refills | Status: DC

## 2018-05-17 ENCOUNTER — Encounter (HOSPITAL_COMMUNITY): Payer: Self-pay

## 2018-05-17 ENCOUNTER — Inpatient Hospital Stay (HOSPITAL_COMMUNITY)
Admission: EM | Admit: 2018-05-17 | Discharge: 2018-05-20 | DRG: 580 | Disposition: A | Discharge status: Home / Self Care | Payer: No Typology Code available for payment source | Attending: Internal Medicine | Admitting: Internal Medicine

## 2018-05-17 ENCOUNTER — Emergency Department (HOSPITAL_COMMUNITY): Payer: No Typology Code available for payment source

## 2018-05-17 DIAGNOSIS — I10 Essential (primary) hypertension: Secondary | ICD-10-CM | POA: Diagnosis present

## 2018-05-17 DIAGNOSIS — M549 Dorsalgia, unspecified: Secondary | ICD-10-CM | POA: Diagnosis present

## 2018-05-17 DIAGNOSIS — D573 Sickle-cell trait: Secondary | ICD-10-CM | POA: Diagnosis present

## 2018-05-17 DIAGNOSIS — F39 Unspecified mood [affective] disorder: Secondary | ICD-10-CM | POA: Diagnosis present

## 2018-05-17 DIAGNOSIS — J301 Allergic rhinitis due to pollen: Secondary | ICD-10-CM | POA: Diagnosis present

## 2018-05-17 DIAGNOSIS — L02611 Cutaneous abscess of right foot: Secondary | ICD-10-CM | POA: Diagnosis present

## 2018-05-17 DIAGNOSIS — B353 Tinea pedis: Secondary | ICD-10-CM | POA: Diagnosis present

## 2018-05-17 DIAGNOSIS — F17210 Nicotine dependence, cigarettes, uncomplicated: Secondary | ICD-10-CM | POA: Diagnosis present

## 2018-05-17 DIAGNOSIS — Z79899 Other long term (current) drug therapy: Secondary | ICD-10-CM

## 2018-05-17 DIAGNOSIS — L0390 Cellulitis, unspecified: Secondary | ICD-10-CM | POA: Diagnosis present

## 2018-05-17 DIAGNOSIS — E785 Hyperlipidemia, unspecified: Secondary | ICD-10-CM | POA: Diagnosis present

## 2018-05-17 DIAGNOSIS — L97519 Non-pressure chronic ulcer of other part of right foot with unspecified severity: Secondary | ICD-10-CM | POA: Diagnosis present

## 2018-05-17 DIAGNOSIS — J449 Chronic obstructive pulmonary disease, unspecified: Secondary | ICD-10-CM | POA: Diagnosis present

## 2018-05-17 DIAGNOSIS — G8929 Other chronic pain: Secondary | ICD-10-CM | POA: Diagnosis present

## 2018-05-17 DIAGNOSIS — Z1629 Resistance to other single specified antibiotic: Secondary | ICD-10-CM | POA: Diagnosis present

## 2018-05-17 DIAGNOSIS — L309 Dermatitis, unspecified: Secondary | ICD-10-CM | POA: Diagnosis present

## 2018-05-17 DIAGNOSIS — L03115 Cellulitis of right lower limb: Secondary | ICD-10-CM

## 2018-05-17 LAB — CBC WITH DIFFERENTIAL/PLATELET
BASOS ABS: 0 10*3/uL (ref 0.0–0.1)
Basophils Relative: 1 %
Eosinophils Absolute: 0.4 10*3/uL (ref 0.0–0.7)
Eosinophils Relative: 6 %
HCT: 37.4 % (ref 36.0–46.0)
Hemoglobin: 12.9 g/dL (ref 12.0–15.0)
LYMPHS PCT: 48 %
Lymphs Abs: 3.3 10*3/uL (ref 0.7–4.0)
MCH: 29.6 pg (ref 26.0–34.0)
MCHC: 34.5 g/dL (ref 30.0–36.0)
MCV: 85.8 fL (ref 78.0–100.0)
MONO ABS: 0.4 10*3/uL (ref 0.1–1.0)
Monocytes Relative: 6 %
Neutro Abs: 2.6 10*3/uL (ref 1.7–7.7)
Neutrophils Relative %: 39 %
PLATELETS: 261 10*3/uL (ref 150–400)
RBC: 4.36 MIL/uL (ref 3.87–5.11)
RDW: 14.9 % (ref 11.5–15.5)
WBC: 6.6 10*3/uL (ref 4.0–10.5)

## 2018-05-17 LAB — BASIC METABOLIC PANEL
Anion gap: 10 (ref 5–15)
BUN: 8 mg/dL (ref 8–23)
CALCIUM: 9.4 mg/dL (ref 8.9–10.3)
CO2: 26 mmol/L (ref 22–32)
Chloride: 107 mmol/L (ref 98–111)
Creatinine, Ser: 0.94 mg/dL (ref 0.44–1.00)
GFR calc Af Amer: >60 mL/min (ref >60)
GLUCOSE: 77 mg/dL (ref 70–99)
Potassium: 3.7 mmol/L (ref 3.5–5.1)
Sodium: 143 mmol/L (ref 135–145)

## 2018-05-17 LAB — SEDIMENTATION RATE: Sed Rate: 31 mm/hr — ABNORMAL HIGH (ref 0–22)

## 2018-05-17 LAB — C-REACTIVE PROTEIN: CRP: 0.8 mg/dL (ref <1.0)

## 2018-05-17 MED ORDER — PIPERACILLIN-TAZOBACTAM 3.375 G IVPB 30 MIN
3.3750 g | Freq: Once | INTRAVENOUS | Status: AC
  Administered 2018-05-17: 3.375 g via INTRAVENOUS
  Filled 2018-05-17: qty 50

## 2018-05-17 MED ORDER — ACETAMINOPHEN 650 MG RE SUPP: 650.0000 mg | Freq: Four times a day (QID) | RECTAL | Status: DC | PRN

## 2018-05-17 MED ORDER — VANCOMYCIN HCL IN DEXTROSE 1-5 GM/200ML-% IV SOLN
1000.0000 mg | Freq: Once | INTRAVENOUS | Status: AC
  Administered 2018-05-17: 1000 mg via INTRAVENOUS
  Filled 2018-05-17: qty 200

## 2018-05-17 MED ORDER — SODIUM CHLORIDE 0.9 % IV SOLN
INTRAVENOUS | Status: AC
  Administered 2018-05-18: via INTRAVENOUS

## 2018-05-17 MED ORDER — VANCOMYCIN HCL IN DEXTROSE 750-5 MG/150ML-% IV SOLN
750.0000 mg | INTRAVENOUS | Status: DC
  Administered 2018-05-18: 750 mg via INTRAVENOUS
  Filled 2018-05-17 (×2): qty 150

## 2018-05-17 MED ORDER — IPRATROPIUM-ALBUTEROL 0.5-2.5 (3) MG/3ML IN SOLN
3.0000 mL | Freq: Once | RESPIRATORY_TRACT | Status: AC
  Administered 2018-05-17: 3 mL via RESPIRATORY_TRACT
  Filled 2018-05-17: qty 3

## 2018-05-17 MED ORDER — ENOXAPARIN SODIUM 40 MG/0.4ML ~~LOC~~ SOLN
40.0000 mg | Freq: Every day | SUBCUTANEOUS | Status: DC
  Administered 2018-05-18 – 2018-05-20 (×3): 40 mg via SUBCUTANEOUS
  Filled 2018-05-17 (×3): qty 0.4

## 2018-05-17 MED ORDER — SODIUM CHLORIDE 0.9 % IV SOLN
1.0000 g | Freq: Two times a day (BID) | INTRAVENOUS | Status: DC
  Administered 2018-05-18 – 2018-05-20 (×5): 1 g via INTRAVENOUS
  Filled 2018-05-17 (×6): qty 1

## 2018-05-17 MED ORDER — ACETAMINOPHEN 325 MG PO TABS
650.0000 mg | ORAL_TABLET | Freq: Four times a day (QID) | ORAL | Status: DC | PRN
  Administered 2018-05-18 – 2018-05-20 (×4): 650 mg via ORAL
  Filled 2018-05-17 (×4): qty 2

## 2018-05-17 NOTE — ED Notes (Signed)
Patient transported to X-ray 

## 2018-05-17 NOTE — Progress Notes (Signed)
A consult was received from an ED physician for vancomycin and zosyn per pharmacy dosing.  The patient's profile has been reviewed for ht/wt/allergies/indication/available labs.   A one time order has been placed for vancomycin 1g and zosyn 3.375g.    Further antibiotics/pharmacy consults should be ordered by admitting physician if indicated.                       Thank you, Loralee PacasErin Staci Carver, PharmD, BCPS 05/17/2018  8:02 PM

## 2018-05-17 NOTE — ED Provider Notes (Signed)
Patient placed in Quick Look pathway, seen and evaluated   Chief Complaint: R foot infection  HPI:   62 year old female with a history of COPD, chronic daily smoking presents the emergency department chief complaint of worsening foot infection after 3 days of antibiotics.  Patient states that she was seen at the urgent care because she could not give him with her primary care physician on 05/14/2018.  She complains of a painful blister between her  fourth and fifth right toe with swelling of the entire foot, erythema that is worsening.  She denies fever or chills.  ROS: Foot wound, foot pain negative for fever (one)  Physical Exam:   Gen: No distress  Neuro: Awake and Alert  Skin: Warm    Focused Exam: Patient is actively wheezing.  (Her son is bringing her inhaler) Right foot with bulla in between the fourth and fifth digits, exquisitely tender to palpation, erythema and swelling along the dorsum and the plantar surface of the foot in the region of the MTPs.  She has a bounding pulse.  I have begun initial work-up including sed rate CRP BMP and CBC.  Patient will need foot x-ray and higher level of care.   Initiation of care has begun. The patient has been counseled on the process, plan, and necessity for staying for the completion/evaluation, and the remainder of the medical screening examination    Arthor CaptainHarris, Catrell Morrone, PA-C 05/17/18 1816    Terrilee FilesButler, Michael C, MD 05/18/18 [id number]

## 2018-05-17 NOTE — ED Notes (Signed)
ED TO INPATIENT HANDOFF REPORT  Name/Age/Gender Tammy Schwartz 62 y.o. female  Code Status    Code Status Orders  (From admission, onward)        Start     Ordered   05/17/18 2226  Full code  Continuous     05/17/18 2227    Code Status History    Date Active Date Inactive Code Status Order ID Comments User Context   05/07/2016 0711 05/08/2016 2127 Full Code [id number]  Radene Gunning, NP ED   01/20/2014 1148 01/20/2014 2055 Full Code [id number]  Ashok Pall, MD Inpatient      Home/SNF/Other Home  Chief Complaint Right foot pain   Level of Care/Admitting Diagnosis ED Disposition    ED Disposition Condition [place]: [facility] [637858]  Level of Care: Med-Surg [16]  Diagnosis: Cellulitis [850277]  Admitting Physician: Jani Gravel [[id number]]  Attending Physician: Jani Gravel [phone]  Estimated length of stay: past midnight tomorrow  Certification:: I certify this patient will need inpatient services for at least 2 midnights  PT Class (Do Not Modify): Inpatient [101]  PT Acc Code (Do Not Modify): Private [1]       Medical History Past Medical History:  Diagnosis Date  . Chronic back pain    HNp,spondylosis,radiculopathy  . COPD (chronic obstructive pulmonary disease) ([place])   . Hyperlipidemia    was on choloesterol meds last yr-samples given in office but not needed since  . Hypertension    takes Exforge daily  . Joint pain   . Pneumonia    hx of;many yrs ago  . Shortness of breath    takes Singulair daily;with exertion  . Sickle cell trait ([place])   . Spondylolysis   . Tobacco use     Allergies No Known Allergies  IV Location/Drains/Wounds Patient Lines/Drains/Airways Status   Active Line/Drains/Airways    Name:   Placement date:   Placement time:   Site:   Days:   Peripheral IV 05/17/18 Right Antecubital   05/17/18    2049    Antecubital   less than 1   Incision 06/04/12 Back Bilateral   06/04/12    1527      2173   Incision 06/16/13 Back Bilateral   06/16/13    1410     1796   Incision (Closed) 01/20/14 Back Other (Comment)   01/20/14    0938     1578          Labs/Imaging Results for orders placed or performed during the hospital encounter of 05/17/18 (from the past 48 hour(s))  Basic metabolic panel     Status: None   Collection Time: 05/17/18  7:13 PM  Result Value Ref Range   Sodium 143 135 - 145 mmol/L   Potassium 3.7 3.5 - 5.1 mmol/L   Chloride 107 98 - 111 mmol/L   CO2 26 22 - 32 mmol/L   Glucose, Bld 77 70 - 99 mg/dL   BUN 8 8 - 23 mg/dL   Creatinine, Ser 0.94 0.44 - 1.00 mg/dL   Calcium 9.4 8.9 - 10.3 mg/dL   GFR calc non Af Amer >60 >60 mL/min   GFR calc Af Amer >60 >60 mL/min    Comment: (NOTE) The eGFR has been calculated using the CKD EPI equation. This calculation has not been validated in all clinical situations. eGFR's persistently <60 mL/min signify possible Chronic Kidney Disease.    Anion gap 10 5 - 15  Comment: Performed at [facility], [place] [address], [place], [place] [address]  CBC with Differential     Status: None   Collection Time: 05/17/18  7:13 PM  Result Value Ref Range   WBC 6.6 4.0 - 10.5 K/uL   RBC 4.36 3.87 - 5.11 MIL/uL   Hemoglobin 12.9 12.0 - 15.0 g/dL   HCT 37.4 36.0 - 46.0 %   MCV 85.8 78.0 - 100.0 fL   MCH 29.6 26.0 - 34.0 pg   MCHC 34.5 30.0 - 36.0 g/dL   RDW 14.9 11.5 - 15.5 %   Platelets 261 150 - 400 K/uL   Neutrophils Relative % 39 %   Neutro Abs 2.6 1.7 - 7.7 K/uL   Lymphocytes Relative 48 %   Lymphs Abs 3.3 0.7 - 4.0 K/uL   Monocytes Relative 6 %   Monocytes Absolute 0.4 0.1 - 1.0 K/uL   Eosinophils Relative 6 %   Eosinophils Absolute 0.4 0.0 - 0.7 K/uL   Basophils Relative 1 %   Basophils Absolute 0.0 0.0 - 0.1 K/uL    Comment: Performed at [facility], [place] [address], [place], [place] [address]  Sedimentation rate     Status: Abnormal   Collection Time: 05/17/18  7:13 PM   Result Value Ref Range   Sed Rate 31 (H) 0 - 22 mm/hr    Comment: Performed at [facility], [place] [address], [place], [place] [address]  C-reactive protein     Status: None   Collection Time: 05/17/18  7:13 PM  Result Value Ref Range   CRP 0.8 <1.0 mg/dL    Comment: Performed at [facility], [place] [address], [place], [place] [address]   Dg Foot Complete Right  Result Date: 05/17/2018 CLINICAL DATA:  Worsening foot infection after antibiotics for 3 days. Painful blister between fourth and fifth toes with diffuse swelling. EXAM: RIGHT FOOT COMPLETE - 3+ VIEW COMPARISON:  None. FINDINGS: No fracture dislocation. No bony erosion. Soft tissue swelling noted. IMPRESSION: No bony erosion.  Soft tissue swelling. Electronically Signed   By: Dorise Bullion III M.D   On: 05/17/2018 19:14    Pending Labs Unresulted Labs (From admission, onward)   Start     Ordered   05/24/18 0500  Creatinine, serum  (enoxaparin (LOVENOX)    CrCl >/= 30 ml/min)  Weekly,   R    Comments:  while on enoxaparin therapy    05/17/18 2227   05/18/18 0500  Comprehensive metabolic panel  Tomorrow morning,   R     05/17/18 2227   05/18/18 0500  CBC  Tomorrow morning,   R     05/17/18 2227   05/17/18 2229  Aerobic Culture (superficial specimen)  Once,   R    Question:  Patient immune status  Answer:  Normal   05/17/18 2228   05/17/18 2000  Culture, blood (routine x 2)  BLOOD CULTURE X 2,   STAT     05/17/18 1959   05/17/18 1959  Aerobic/Anaerobic Culture (surgical/deep wound)  Once,   R     05/17/18 1958      Vitals/Pain Today's Vitals   05/17/18 1705 05/17/18 2036 05/17/18 2050 05/17/18 2231  BP:   139/81 135/78  Pulse:   73 74  Resp:   16 18  Temp:      TempSrc:      SpO2:  100% 100% 100%  Weight: 120 lb (54.4 kg)  Height: 5' 1" (1.549 m)     PainSc: 10-Worst pain ever       Isolation Precautions No active isolations  Medications Medications  enoxaparin (LOVENOX)  injection 40 mg (has no administration in time range)  acetaminophen (TYLENOL) tablet 650 mg (has no administration in time range)    Or  acetaminophen (TYLENOL) suppository 650 mg (has no administration in time range)  0.9 %  sodium chloride infusion (has no administration in time range)  ipratropium-albuterol (DUONEB) 0.5-2.5 (3) MG/3ML nebulizer solution 3 mL (3 mLs Nebulization Given 05/17/18 2036)  vancomycin (VANCOCIN) IVPB 1000 mg/200 mL premix (0 mg Intravenous Stopped 05/17/18 2135)  piperacillin-tazobactam (ZOSYN) IVPB 3.375 g (0 g Intravenous Stopped 05/17/18 2136)    Mobility walks

## 2018-05-17 NOTE — ED Notes (Signed)
attemted lab draw x2, was unsuccessful

## 2018-05-17 NOTE — ED Notes (Signed)
Dr. Selena BattenKim arrived to room at time of transport, after assessment will take patient upstairs.

## 2018-05-17 NOTE — ED Triage Notes (Signed)
Pt presents with c/o right foot pain. Pt reports she has a sore on the top of her foot, wrapped at this time. [facility] and was told to come here if it became worse.

## 2018-05-17 NOTE — ED Notes (Signed)
Bed: [id number] Expected date:  Expected time:  Means of arrival:  Comments: Dr in with admit pt in room

## 2018-05-17 NOTE — H&P (Signed)
TRH H&P   Patient Demographics:    Tammy Schwartz, is a 62 y.o. female  MRN: [id number]   DOB - 06-29-1956  Admit Date - 05/17/2018  Outpatient Primary MD for the patient is Charlott Rakes, MD  Referring MD/NP/PA:     Outpatient Specialists:    Patient coming from: home   Chief Complaint  Patient presents with  . Foot Pain      HPI:    Tammy Schwartz  is a 62 y.o. female, w hypertension, hyperlipidemia, Copd, presents with right foot pain for the past 2 days. Pt states redness on the dorsum of the foot.  Denies fever, chills, cough, cp, palp, sob, n/v, diarrhea, dysuria. Pt presented to ED.    In ED,   Xray right foot IMPRESSION: No bony erosion.  Soft tissue swelling.  Wbc 6.6, Hgb 12.9, Plt 261 Na 143, K 3.7, Bun 8, Creatinine 0.94 ESR 31 Crp 0.8 Blood culture x 2 pending  Pt will be admitted for cellulitis.     Review of systems:    In addition to the HPI above, No Fever-chills, No Headache, No changes with Vision or hearing, No problems swallowing food or Liquids, No Chest pain, Cough or Shortness of Breath, No Abdominal pain, No Nausea or Vommitting, Bowel movements are regular, No Blood in stool or Urine, No dysuria,  No new joints pains-aches,  No new weakness, tingling, numbness in any extremity, No recent weight gain or loss, No polyuria, polydypsia or polyphagia, No significant Mental Stressors.  A full 10 point Review of Systems was done, except as stated above, all other Review of Systems were negative.   With Past History of the following :    Past Medical History:  Diagnosis Date  . Chronic back pain    HNp,spondylosis,radiculopathy  . COPD (chronic obstructive pulmonary disease) ([place])   . Hyperlipidemia    was on choloesterol meds last yr-samples given in office but not needed since  . Hypertension    takes Exforge daily    . Joint pain   . Pneumonia    hx of;many yrs ago  . Shortness of breath    takes Singulair daily;with exertion  . Sickle cell trait ([place])   . Spondylolysis   . Tobacco use       Past Surgical History:  Procedure Laterality Date  . BACK SURGERY    . BREAST EXCISIONAL BIOPSY Left 2007   benign  . BREAST SURGERY Left    lumpectomy  . COLONOSCOPY    . DILATION AND CURETTAGE OF UTERUS    . LUMBAR LAMINECTOMY/DECOMPRESSION MICRODISCECTOMY  06/04/2012   Procedure: LUMBAR LAMINECTOMY/DECOMPRESSION MICRODISCECTOMY 1 LEVEL;  Surgeon: Winfield Cunas, MD;  Location: [place] [facility];  Service: Neurosurgery;  Laterality: Left;  LEFT Lumbar five-sacral one diskectomy  . LUMBAR LAMINECTOMY/DECOMPRESSION MICRODISCECTOMY Right 06/16/2013   Procedure: RIGHT Lumbar Five-Sacral One Microdiskectomy;  Surgeon: Winfield Cunas, MD;  Location: [place] [facility];  Service: Neurosurgery;  Laterality: Right;  RIGHT Lumbar Five-Sacral One Microdiskectomy  . LUMBAR LAMINECTOMY/DECOMPRESSION MICRODISCECTOMY Right 01/20/2014   Procedure: LUMBAR FOUR TO LUMBAR FIVE LUMBAR LAMINECTOMY/DECOMPRESSION MICRODISCECTOMY 1 LEVEL;  Surgeon: Winfield Cunas, MD;  Location: [place] [facility];  Service: Neurosurgery;  Laterality: Right;  Right L45 laminectomy and foramintomy      Social History:     Social History   Tobacco Use  . Smoking status: Current Every Day Smoker    Packs/day: 0.25    Years: 20.00    Pack years: 5.00    Types: Cigarettes  . Smokeless tobacco: Never Used  Substance Use Topics  . Alcohol use: No     Lives - at home  Mobility - walks by self   Family History :     Family History  Problem Relation Age of Onset  . Cancer - Other Mother   . CAD Father   . Hypertension Sister        Home Medications:   Prior to Admission medications   Medication Sig Start Date End Date Taking? Authorizing Provider  acetaminophen (TYLENOL) 325 MG tablet Take 2 tablets (650 mg total) by mouth every 6 (six) hours as  needed. 10/08/17  Yes Burky, Lanelle Bal B, NP  benzonatate (TESSALON) 100 MG capsule TAKE 1 CAPSULE BY MOUTH 2 (TWO) TIMES DAILY AS NEEDED FOR COUGH. 05/15/18  Yes Wurst, [place], PA-C  cetirizine (ZYRTEC) 10 MG tablet Take 1 tablet (10 mg total) by mouth daily. 09/28/17  Yes Argentina Donovan, PA-C  diclofenac (VOLTAREN) 75 MG EC tablet Take 1 tablet (75 mg total) by mouth 2 (two) times daily. Prn pain 03/31/18  Yes Charlott Rakes, MD  DULoxetine (CYMBALTA) 60 MG capsule Take 1 capsule (60 mg total) by mouth daily. 03/31/18  Yes Newlin, Charlane Ferretti, MD  fluticasone (FLONASE) 50 MCG/ACT nasal spray Place 1 spray into both nostrils daily. 03/31/18  Yes Newlin, En[place]ong, MD  fluticasone furoate-vilanterol (BREO ELLIPTA) 100-25 MCG/INH AEPB Inhale 1 puff into the lungs daily. 03/31/18  Yes Charlott Rakes, MD  gabapentin (NEURONTIN) 300 MG capsule Take 2 capsules (600 mg total) by mouth 3 (three) times daily. 03/31/18  Yes Charlott Rakes, MD  hydrOXYzine (ATARAX/VISTARIL) 25 MG tablet Take 1 tablet (25 mg total) by mouth every 8 (eight) hours as needed. 03/31/18  Yes Newlin, Charlane Ferretti, MD  ipratropium (ATROVENT) 0.06 % nasal spray Place 2 sprays into both nostrils 4 (four) times daily. 11/11/16  Yes Kindl, Nelda Severe, MD  lactulose (CHRONULAC) 10 GM/15ML solution Take 15 mLs (10 g total) by mouth 2 (two) times daily as needed for mild constipation. 06/23/17  Yes Charlott Rakes, MD  lisinopril-hydrochlorothiazide (ZESTORETIC) 20-12.5 MG tablet Take 2 tablets by mouth daily. 03/31/18  Yes Charlott Rakes, MD  mirtazapine (REMERON) 30 MG tablet Take 1 tablet (30 mg total) by mouth at bedtime. 03/31/18  Yes Newlin, Charlane Ferretti, MD  montelukast (SINGULAIR) 10 MG tablet Take 10 mg by mouth daily.    Yes [provider]  Multiple Vitamin (MULTIVITAMIN WITH MINERALS) TABS Take 1 tablet by mouth daily.   Yes [provider]  polyethylene glycol powder (GLYCOLAX/MIRALAX) powder Take 17 g by mouth daily. 06/09/17  Yes  Charlott Rakes, MD  tiZANidine (ZANAFLEX) 4 MG tablet Take 1 tablet (4 mg total) by mouth every 8 (eight) hours as needed for muscle spasms. 03/31/18  Yes Charlott Rakes, MD  traMADol (ULTRAM) 50 MG tablet Take 1  tablet (50 mg total) by mouth at bedtime. 03/31/18  Yes Newlin, Charlane Ferretti, MD  triamcinolone cream (KENALOG) 0.1 % APPLY 1 APPLICATION TOPICALLY 2 TIMES DAILY. 04/01/18  Yes Newlin, Charlane Ferretti, MD  VENTOLIN HFA 108 (90 Base) MCG/ACT inhaler Inhale 2 puffs into the lungs every 6 (six) hours as needed. 06/08/17  Yes Charlott Rakes, MD  doxycycline (VIBRAMYCIN) 100 MG capsule Take 1 capsule (100 mg total) by mouth 2 (two) times daily. 05/15/18   Wurst, [place], PA-C  hydrocortisone-pramoxine (PROCTOFOAM HC) rectal foam Place 1 applicator rectally 2 (two) times daily. Patient not taking: Reported on 01/01/2018 09/28/17   Charlott Rakes, MD     Allergies:    No Known Allergies   Physical Exam:   Vitals  Blood pressure 135/78, pulse 74, temperature 98.2 F (36.8 C), temperature source Oral, resp. rate 18, height '5\' 1"'  (1.549 m), weight 120 lb (54.4 kg), last menstrual period 03/17/2012, SpO2 100 %.   1. General lying in bed in NAD,    2. Normal affect and insight, Not Suicidal or Homicidal, Awake Alert, Oriented X 3.  3. No F.N deficits, ALL C.Nerves Intact, Strength 5/5 all 4 extremities, Sensation intact all 4 extremities, Plantars down going.  4. Ears and Eyes appear Normal, Conjunctivae clear, PERRLA. Moist Oral Mucosa.  5. Supple Neck, No JVD, No cervical lymphadenopathy appriciated, No Carotid Bruits.  6. Symmetrical Chest wall movement, Good air movement bilaterally, CTAB.  7. RRR, No Gallops, Rubs or Murmurs, No Parasternal Heave.  8. Positive Bowel Sounds, Abdomen Soft, No tenderness, No organomegaly appriciated,No rebound -guarding or rigidity.  9.  No Cyanosis, Normal Skin Turgor,  Redness and warmth on the dorsum of the right foot, and also a 45m hole and skin erosion  between the 4-5th toe right foot  10. Good muscle tone,  joints appear normal , no effusions, Normal ROM.  11. No Palpable Lymph Nodes in Neck or Axillae     Data Review:    CBC Recent Labs  Lab 05/17/18 1913  WBC 6.6  HGB 12.9  HCT 37.4  PLT 261  MCV 85.8  MCH 29.6  MCHC 34.5  RDW 14.9  LYMPHSABS 3.3  MONOABS 0.4  EOSABS 0.4  BASOSABS 0.0   ------------------------------------------------------------------------------------------------------------------  Chemistries  Recent Labs  Lab 05/17/18 1913  NA 143  K 3.7  CL 107  CO2 26  GLUCOSE 77  BUN 8  CREATININE 0.94  CALCIUM 9.4   ------------------------------------------------------------------------------------------------------------------ estimated creatinine clearance is 46.2 mL/min (by C-G formula based on SCr of 0.94 mg/dL). ------------------------------------------------------------------------------------------------------------------ No results for input(s): TSH, T4TOTAL, T3FREE, THYROIDAB in the last 72 hours.  Invalid input(s): FREET3  Coagulation profile No results for input(s): INR, PROTIME in the last 168 hours. ------------------------------------------------------------------------------------------------------------------- No results for input(s): DDIMER in the last 72 hours. -------------------------------------------------------------------------------------------------------------------  Cardiac Enzymes No results for input(s): CKMB, TROPONINI, MYOGLOBIN in the last 168 hours.  Invalid input(s): CK ------------------------------------------------------------------------------------------------------------------ No results found for: BNP   ---------------------------------------------------------------------------------------------------------------  Urinalysis    Component Value Date/Time   LABSPEC 1.020 11/20/2017 2006   PHURINE 7.0 11/20/2017 2006   GLUCOSEU NEGATIVE  11/20/2017 2006   HGBUR LARGE (A) 11/20/2017 2006   [place] 11/20/2017 2006   [place] 11/20/2017 2006   PROTEINUR 30 (A) 11/20/2017 2006   UROBILINOGEN 0.2 11/20/2017 2006   NITRITE NEGATIVE 11/20/2017 2006   LEUKOCYTESUR NEGATIVE 11/20/2017 2006    ----------------------------------------------------------------------------------------------------------------   Imaging Results:    Dg Foot Complete Right  Result Date: 05/17/2018 CLINICAL DATA:  Worsening foot infection after antibiotics for 3 days. Painful blister between fourth and fifth toes with diffuse swelling. EXAM: RIGHT FOOT COMPLETE - 3+ VIEW COMPARISON:  None. FINDINGS: No fracture dislocation. No bony erosion. Soft tissue swelling noted. IMPRESSION: No bony erosion.  Soft tissue swelling. Electronically Signed   By: Dorise Bullion III M.D   On: 05/17/2018 19:14       Assessment & Plan:    Principal Problem:   Cellulitis    Cellulitis Blood culture x2 pending Vanco iv, Cefepime iv pharmacy to dose  Hypertension Cont Lisinopril/hydrochlorothiazide  Copd Cont Breo Cont Ventolin HFA Cont Singulair   Hayfever Cont Atrovent ns Cont Flonase ns  Chronic back pain Cont Tramadol  Cont Gabapentin Cont Zanaflex  DVT Prophylaxis Lovenox - SCDs   AM Labs Ordered, also please review Full Orders  Family Communication: Admission, patients condition and plan of care including tests being ordered have been discussed with the patient  who indicate understanding and agree with the plan and Code Status.  Code Status  FULL CODE  Likely DC to  home  Condition GUARDED    Consults called: none  Admission status: inpatient   Time spent in minutes : 60  Jani Gravel M.D on 05/17/2018 at 11:16 PM  Between 7am to 7pm - Pager - [phone] . After 7pm go to [email or web address] - password [facility]  [facility] - Office  [phone]

## 2018-05-17 NOTE — ED Provider Notes (Signed)
[facility] DEPT Provider Note   CSN: [id number] Arrival date & time: 05/17/18  1604     History   Chief Complaint Chief Complaint  Patient presents with  . Foot Pain    HPI Tammy Schwartz is a 62 y.o. female with past medical history of chronic back pain, COPD, hypertension, sickle cell trait, who presents today for evaluation of a wound on her right foot.  She reports that last week she started having generalized pain in the right foot, approximately 5 days ago.  She was seen 3 days ago at [facility] urgent care where she had foot pain where the provider diagnosed her with foot swelling and cellulitis.  She was started on doxycycline which she says she started yesterday.  Today patient presents for a significant worsening along with joint pains.  She reports that since yesterday and into today she has developed a blister in between her right fourth and fifth toes.  This area is not extremely painful.  She also reports that her foot has started swelling.  It is made worse with touch and movement.   HPI  Past Medical History:  Diagnosis Date  . Chronic back pain    HNp,spondylosis,radiculopathy  . COPD (chronic obstructive pulmonary disease) ([place])   . Hyperlipidemia    was on choloesterol meds last yr-samples given in office but not needed since  . Hypertension    takes Exforge daily  . Joint pain   . Pneumonia    hx of;many yrs ago  . Shortness of breath    takes Singulair daily;with exertion  . Sickle cell trait ([place])   . Spondylolysis   . Tobacco use     Patient Active Problem List   Diagnosis Date Noted  . Cellulitis 05/17/2018  . Lumbar radiculopathy 02/27/2017  . Poor dentition 02/11/2017  . Acute bronchitis   . Chest pain 05/07/2016  . Chest pain at rest 05/07/2016  . Essential hypertension   . Hyperlipidemia   . Tobacco use   . Spondylolysis   . Chronic obstructive pulmonary disease ([place])   . Lumbar spondylosis 01/20/2014  .  HNP (herniated nucleus pulposus), lumbar 06/04/2012    Past Surgical History:  Procedure Laterality Date  . BACK SURGERY    . BREAST EXCISIONAL BIOPSY Left 2007   benign  . BREAST SURGERY Left    lumpectomy  . COLONOSCOPY    . DILATION AND CURETTAGE OF UTERUS    . LUMBAR LAMINECTOMY/DECOMPRESSION MICRODISCECTOMY  06/04/2012   Procedure: LUMBAR LAMINECTOMY/DECOMPRESSION MICRODISCECTOMY 1 LEVEL;  Surgeon: Winfield Cunas, MD;  Location: [place] [facility];  Service: Neurosurgery;  Laterality: Left;  LEFT Lumbar five-sacral one diskectomy  . LUMBAR LAMINECTOMY/DECOMPRESSION MICRODISCECTOMY Right 06/16/2013   Procedure: RIGHT Lumbar Five-Sacral One Microdiskectomy;  Surgeon: Winfield Cunas, MD;  Location: [facility];  Service: Neurosurgery;  Laterality: Right;  RIGHT Lumbar Five-Sacral One Microdiskectomy  . LUMBAR LAMINECTOMY/DECOMPRESSION MICRODISCECTOMY Right 01/20/2014   Procedure: LUMBAR FOUR TO LUMBAR FIVE LUMBAR LAMINECTOMY/DECOMPRESSION MICRODISCECTOMY 1 LEVEL;  Surgeon: Winfield Cunas, MD;  Location: [place] [facility];  Service: Neurosurgery;  Laterality: Right;  Right L45 laminectomy and foramintomy     OB History    Gravida  2   Para      Term      Preterm      AB      Living  2     SAB      TAB      Ectopic  Multiple      Live Births  2            Home Medications    Prior to Admission medications   Medication Sig Start Date End Date Taking? Authorizing Provider  acetaminophen (TYLENOL) 325 MG tablet Take 2 tablets (650 mg total) by mouth every 6 (six) hours as needed. 10/08/17  Yes Burky, Lanelle Bal B, NP  benzonatate (TESSALON) 100 MG capsule TAKE 1 CAPSULE BY MOUTH 2 (TWO) TIMES DAILY AS NEEDED FOR COUGH. 05/15/18  Yes Wurst, [place], PA-C  cetirizine (ZYRTEC) 10 MG tablet Take 1 tablet (10 mg total) by mouth daily. 09/28/17  Yes Argentina Donovan, PA-C  diclofenac (VOLTAREN) 75 MG EC tablet Take 1 tablet (75 mg total) by mouth 2 (two) times daily. Prn pain  03/31/18  Yes Charlott Rakes, MD  DULoxetine (CYMBALTA) 60 MG capsule Take 1 capsule (60 mg total) by mouth daily. 03/31/18  Yes Newlin, Charlane Ferretti, MD  fluticasone (FLONASE) 50 MCG/ACT nasal spray Place 1 spray into both nostrils daily. 03/31/18  Yes Newlin, Enobong, MD  fluticasone furoate-vilanterol (BREO ELLIPTA) 100-25 MCG/INH AEPB Inhale 1 puff into the lungs daily. 03/31/18  Yes Charlott Rakes, MD  gabapentin (NEURONTIN) 300 MG capsule Take 2 capsules (600 mg total) by mouth 3 (three) times daily. 03/31/18  Yes Charlott Rakes, MD  hydrOXYzine (ATARAX/VISTARIL) 25 MG tablet Take 1 tablet (25 mg total) by mouth every 8 (eight) hours as needed. 03/31/18  Yes Newlin, Charlane Ferretti, MD  ipratropium (ATROVENT) 0.06 % nasal spray Place 2 sprays into both nostrils 4 (four) times daily. 11/11/16  Yes Kindl, Nelda Severe, MD  lactulose (CHRONULAC) 10 GM/15ML solution Take 15 mLs (10 g total) by mouth 2 (two) times daily as needed for mild constipation. 06/23/17  Yes Charlott Rakes, MD  lisinopril-hydrochlorothiazide (ZESTORETIC) 20-12.5 MG tablet Take 2 tablets by mouth daily. 03/31/18  Yes Charlott Rakes, MD  mirtazapine (REMERON) 30 MG tablet Take 1 tablet (30 mg total) by mouth at bedtime. 03/31/18  Yes Newlin, Charlane Ferretti, MD  montelukast (SINGULAIR) 10 MG tablet Take 10 mg by mouth daily.    Yes [provider]  Multiple Vitamin (MULTIVITAMIN WITH MINERALS) TABS Take 1 tablet by mouth daily.   Yes [provider]  polyethylene glycol powder (GLYCOLAX/MIRALAX) powder Take 17 g by mouth daily. 06/09/17  Yes Charlott Rakes, MD  tiZANidine (ZANAFLEX) 4 MG tablet Take 1 tablet (4 mg total) by mouth every 8 (eight) hours as needed for muscle spasms. 03/31/18  Yes Charlott Rakes, MD  traMADol (ULTRAM) 50 MG tablet Take 1 tablet (50 mg total) by mouth at bedtime. 03/31/18  Yes Newlin, Charlane Ferretti, MD  triamcinolone cream (KENALOG) 0.1 % APPLY 1 APPLICATION TOPICALLY 2 TIMES DAILY. 04/01/18  Yes Newlin, Charlane Ferretti, MD    VENTOLIN HFA 108 (90 Base) MCG/ACT inhaler Inhale 2 puffs into the lungs every 6 (six) hours as needed. 06/08/17  Yes Charlott Rakes, MD  doxycycline (VIBRAMYCIN) 100 MG capsule Take 1 capsule (100 mg total) by mouth 2 (two) times daily. 05/15/18   Wurst, [place], PA-C  hydrocortisone-pramoxine (PROCTOFOAM HC) rectal foam Place 1 applicator rectally 2 (two) times daily. Patient not taking: Reported on 01/01/2018 09/28/17   Charlott Rakes, MD    Family History Family History  Problem Relation Age of Onset  . Cancer - Other Mother   . CAD Father   . Hypertension Sister     Social History Social History   Tobacco Use  . Smoking status: Current Every Day  Smoker    Packs/day: 0.25    Years: 20.00    Pack years: 5.00    Types: Cigarettes  . Smokeless tobacco: Never Used  Substance Use Topics  . Alcohol use: No  . Drug use: No     Allergies   Patient has no known allergies.   Review of Systems Review of Systems  Constitutional: Negative for chills and fever.  HENT: Negative for congestion and sinus pain.   Eyes: Negative for visual disturbance.  Respiratory: Negative for chest tightness and shortness of breath.   Gastrointestinal: Negative for abdominal pain, nausea and vomiting.  Musculoskeletal: Positive for arthralgias. Negative for neck pain and neck stiffness.  Skin: Positive for wound. Negative for color change.  All other systems reviewed and are negative.    Physical Exam Updated Vital Signs BP (!) 115/92 (BP Location: Left Arm)   Pulse 74   Temp 98 F (36.7 C) (Oral)   Resp 18   Ht _0  (1.549 m)   Wt 54.4 kg (120 lb)   LMP 03/17/2012   SpO2 98%   BMI 22.67 kg/m   Physical Exam  Constitutional: She appears well-developed and well-nourished. No distress.  HENT:  Head: Normocephalic and atraumatic.  Eyes: Conjunctivae are normal. Right eye exhibits no discharge. Left eye exhibits no discharge. No scleral icterus.  Neck: Normal range of motion.   Cardiovascular: Normal rate, regular rhythm and intact distal pulses.  Right sided 2+ DP/PT pulses.  Pulmonary/Chest: Effort normal. No stridor. No respiratory distress.  Abdominal: She exhibits no distension.  Musculoskeletal: She exhibits no edema or deformity.  Right foot is generally swollen, worse on the lateral aspect.  There is no crepitus or deformities.  Neurological: She is alert. She exhibits normal muscle tone.  Skin: Skin is warm and dry. She is not diaphoretic.  There is a large bulla in between the fourth and fifth toes.  This is white and devitalized appearing.  The foot is generally red.  Swelling extends up to the malleoli.  Psychiatric: She has a normal mood and affect. Her behavior is normal.  Nursing note and vitals reviewed.        ED Treatments / Results  Labs (all labs ordered are listed, but only abnormal results are displayed) Labs Reviewed  SEDIMENTATION RATE - Abnormal; Notable for the following components:      Result Value   Sed Rate 31 (*)    All other components within normal limits  AEROBIC/ANAEROBIC CULTURE (SURGICAL/DEEP WOUND)  CULTURE, BLOOD (ROUTINE X 2)  CULTURE, BLOOD (ROUTINE X 2)  BASIC METABOLIC PANEL  CBC WITH DIFFERENTIAL/PLATELET  C-REACTIVE PROTEIN  COMPREHENSIVE METABOLIC PANEL  CBC    EKG None  Radiology Dg Foot Complete Right  Result Date: 05/17/2018 CLINICAL DATA:  Worsening foot infection after antibiotics for 3 days. Painful blister between fourth and fifth toes with diffuse swelling. EXAM: RIGHT FOOT COMPLETE - 3+ VIEW COMPARISON:  None. FINDINGS: No fracture dislocation. No bony erosion. Soft tissue swelling noted. IMPRESSION: No bony erosion.  Soft tissue swelling. Electronically Signed   By: Dorise Bullion III M.D   On: 05/17/2018 19:14    Procedures .Marland KitchenIncision and Drainage Date/Time: 05/17/2018 8:05 PM Performed by: Lorin Glass, PA-C Authorized by: Lorin Glass, PA-C   Consent:    Consent  obtained:  Verbal   Consent given by:  Patient   Risks discussed:  Bleeding, incomplete drainage, pain and infection (Damage to other structures, need for additional procedures)  Alternatives discussed:  No treatment, alternative treatment and referral Location:    Type:  Fluid collection   Location: In between right fourth and fifth toes. Pre-procedure details:    Skin preparation:  Chloraprep Anesthesia (see MAR for exact dosages):    Anesthesia method:  None (Debridement was performed of devitalized tissue.) Procedure type:    Complexity:  Simple Procedure details:    Needle aspiration: yes     Incision type: Debridement of devitalized tissue over below performed with iris scissors.   Incision depth:  Subcutaneous   Scalpel blade:  11   Wound management:  Probed and deloculated and irrigated with saline   Drainage:  Serous (yellow, thin, cloudy)   Drainage amount:  Moderate   Wound treatment:  Wound left open   Packing materials:  None Post-procedure details:    Patient tolerance of procedure:  Tolerated well, no immediate complications   (including critical care time)  Medications Ordered in ED Medications  enoxaparin (LOVENOX) injection 40 mg (has no administration in time range)  acetaminophen (TYLENOL) tablet 650 mg (has no administration in time range)    Or  acetaminophen (TYLENOL) suppository 650 mg (has no administration in time range)  0.9 %  sodium chloride infusion ( Intravenous New Bag/Given 05/18/18 0029)  ceFEPIme (MAXIPIME) 1 g in sodium chloride 0.9 % 100 mL IVPB (has no administration in time range)  vancomycin (VANCOCIN) IVPB 750 mg/150 ml premix (has no administration in time range)  DULoxetine (CYMBALTA) DR capsule 60 mg (has no administration in time range)  fluticasone (FLONASE) 50 MCG/ACT nasal spray 1 spray (has no administration in time range)  fluticasone furoate-vilanterol (BREO ELLIPTA) 100-25 MCG/INH 1 puff (has no administration in time range)    gabapentin (NEURONTIN) capsule 600 mg (has no administration in time range)  hydrOXYzine (ATARAX/VISTARIL) tablet 25 mg (has no administration in time range)  ipratropium (ATROVENT) 0.06 % nasal spray 2 spray (has no administration in time range)  lactulose (CHRONULAC) 10 GM/15ML solution 10 g (has no administration in time range)  mirtazapine (REMERON) tablet 30 mg (has no administration in time range)  montelukast (SINGULAIR) tablet 10 mg (has no administration in time range)  polyethylene glycol (MIRALAX / GLYCOLAX) packet 17 g (has no administration in time range)  tiZANidine (ZANAFLEX) tablet 4 mg (has no administration in time range)  traMADol (ULTRAM) tablet 50 mg (has no administration in time range)  albuterol (PROVENTIL) (2.5 MG/3ML) 0.083% nebulizer solution 3 mL (has no administration in time range)  lisinopril (PRINIVIL,ZESTRIL) tablet 40 mg (has no administration in time range)    And  hydrochlorothiazide (HYDRODIURIL) tablet 25 mg (has no administration in time range)  ipratropium-albuterol (DUONEB) 0.5-2.5 (3) MG/3ML nebulizer solution 3 mL (3 mLs Nebulization Given 05/17/18 2036)  vancomycin (VANCOCIN) IVPB 1000 mg/200 mL premix (0 mg Intravenous Stopped 05/17/18 2135)  piperacillin-tazobactam (ZOSYN) IVPB 3.375 g (0 g Intravenous Stopped 05/17/18 2136)     Initial Impression / Assessment and Plan / ED Course  I have reviewed the triage vital signs and the nursing notes.  Pertinent labs & imaging results that were available during my care of the patient were reviewed by me and considered in my medical decision making (see chart for details).    She presents today for evaluation of a painful wound between her right fourth and fifth toes along with foot redness.  She has been on doxycycline as an outpatient however has continued to worsen.  On exam she had a large bullae between  her fourth and fifth toes.  This was sterilely incised and samples were taken.  There was cloudy yellow  fluid in the blister.  Labs were obtained, her white count is not elevated, her ESR is elevated at 31, CRP is pending.  Given that she has been taking doxycycline at home while worsening, and has a significant crater from the wound feel that patient would benefit from inpatient IV antibiotics and wound care.  Hospitalist admitted patient.   Final Clinical Impressions(s) / ED Diagnoses   Final diagnoses:  Cellulitis of right lower extremity    ED Discharge Orders    None       Lorin Glass, PA-C 05/18/18 0055    Charlesetta Shanks, MD 06/01/18 [id number]

## 2018-05-17 NOTE — Progress Notes (Signed)
Pharmacy Antibiotic Note  Tammy MacadamVerna S Schwartz is a 62 y.o. female admitted on 05/17/2018 with cellulitis.  Pharmacy has been consulted for vancomycin and cefepime dosing.  Plan: Cefepime 1 Gm IV q12h Vancomycin 1 gm x1 then 750 mg IV q24h for est AUC = 532 Goal AUC = 400-500 F/u scr/cultures/levels  Height: 5\' 1"  (154.9 cm) Weight: 120 lb (54.4 kg) IBW/kg (Calculated) : 47.8  Temp (24hrs), Avg:98.2 F (36.8 C), Min:98.2 F (36.8 C), Max:98.2 F (36.8 C)  Recent Labs  Lab 05/17/18 1913  WBC 6.6  CREATININE 0.94    Estimated Creatinine Clearance: 46.2 mL/min (by C-G formula based on SCr of 0.94 mg/dL).    No Known Allergies  Antimicrobials this admission: 8/5 zosyn >> x1  8/6 cefepime >>  8/5 vancomycin >>  Dose adjustments this admission:   Microbiology results:  BCx:   UCx:    Sputum:    MRSA PCR:  Thank you for allowing pharmacy to be a part of this patient's care.  Lorenza EvangelistGreen, Shashwat Cleary R 05/17/2018 11:20 PM

## 2018-05-18 ENCOUNTER — Other Ambulatory Visit: Payer: Self-pay

## 2018-05-18 LAB — COMPREHENSIVE METABOLIC PANEL
ALBUMIN: 3.5 g/dL (ref 3.5–5.0)
ALK PHOS: 52 U/L (ref 38–126)
ALT: 24 U/L (ref 0–44)
AST: 26 U/L (ref 15–41)
Anion gap: 8 (ref 5–15)
BUN: 10 mg/dL (ref 8–23)
CALCIUM: 8.9 mg/dL (ref 8.9–10.3)
CHLORIDE: 108 mmol/L (ref 98–111)
CO2: 27 mmol/L (ref 22–32)
CREATININE: 1.05 mg/dL — AB (ref 0.44–1.00)
GFR calc non Af Amer: 55 mL/min — ABNORMAL LOW (ref >60)
GLUCOSE: 81 mg/dL (ref 70–99)
Potassium: 3.3 mmol/L — ABNORMAL LOW (ref 3.5–5.1)
SODIUM: 143 mmol/L (ref 135–145)
Total Bilirubin: 0.8 mg/dL (ref 0.3–1.2)
Total Protein: 6.3 g/dL — ABNORMAL LOW (ref 6.5–8.1)

## 2018-05-18 LAB — CBC
HCT: 34.8 % — ABNORMAL LOW (ref 36.0–46.0)
Hemoglobin: 12.2 g/dL (ref 12.0–15.0)
MCH: 29.4 pg (ref 26.0–34.0)
MCHC: 35.1 g/dL (ref 30.0–36.0)
MCV: 83.9 fL (ref 78.0–100.0)
Platelets: 226 10*3/uL (ref 150–400)
RBC: 4.15 MIL/uL (ref 3.87–5.11)
RDW: 14.9 % (ref 11.5–15.5)
WBC: 5.6 10*3/uL (ref 4.0–10.5)

## 2018-05-18 MED ORDER — LISINOPRIL-HYDROCHLOROTHIAZIDE 20-12.5 MG PO TABS: 2.0000 | ORAL_TABLET | Freq: Every day | ORAL | Status: DC

## 2018-05-18 MED ORDER — LACTULOSE 10 GM/15ML PO SOLN: 10.0000 g | Freq: Two times a day (BID) | ORAL | Status: DC | PRN

## 2018-05-18 MED ORDER — FLUTICASONE FUROATE-VILANTEROL 100-25 MCG/INH IN AEPB
1.0000 | INHALATION_SPRAY | Freq: Every day | RESPIRATORY_TRACT | Status: DC
  Administered 2018-05-18 – 2018-05-20 (×3): 1 via RESPIRATORY_TRACT
  Filled 2018-05-18: qty 28

## 2018-05-18 MED ORDER — POLYETHYLENE GLYCOL 3350 17 G PO PACK
17.0000 g | PACK | Freq: Every day | ORAL | Status: DC
  Administered 2018-05-18 – 2018-05-20 (×3): 17 g via ORAL
  Filled 2018-05-18 (×3): qty 1

## 2018-05-18 MED ORDER — TIZANIDINE HCL 4 MG PO TABS
4.0000 mg | ORAL_TABLET | Freq: Three times a day (TID) | ORAL | Status: DC | PRN
  Administered 2018-05-19: 4 mg via ORAL
  Filled 2018-05-18: qty 1

## 2018-05-18 MED ORDER — DULOXETINE HCL 60 MG PO CPEP
60.0000 mg | ORAL_CAPSULE | Freq: Every day | ORAL | Status: DC
  Administered 2018-05-18 – 2018-05-20 (×3): 60 mg via ORAL
  Filled 2018-05-18 (×3): qty 1

## 2018-05-18 MED ORDER — TRAMADOL HCL 50 MG PO TABS
50.0000 mg | ORAL_TABLET | Freq: Every day | ORAL | Status: DC
  Administered 2018-05-18 (×2): 50 mg via ORAL
  Filled 2018-05-18 (×2): qty 1

## 2018-05-18 MED ORDER — ALBUTEROL SULFATE (2.5 MG/3ML) 0.083% IN NEBU: 3.0000 mL | INHALATION_SOLUTION | Freq: Four times a day (QID) | RESPIRATORY_TRACT | Status: DC | PRN

## 2018-05-18 MED ORDER — POTASSIUM CHLORIDE CRYS ER 20 MEQ PO TBCR
40.0000 meq | EXTENDED_RELEASE_TABLET | Freq: Once | ORAL | Status: AC
  Administered 2018-05-18: 40 meq via ORAL
  Filled 2018-05-18: qty 2

## 2018-05-18 MED ORDER — HYDROCHLOROTHIAZIDE 25 MG PO TABS
25.0000 mg | ORAL_TABLET | Freq: Every day | ORAL | Status: DC
  Administered 2018-05-18 – 2018-05-20 (×3): 25 mg via ORAL
  Filled 2018-05-18 (×3): qty 1

## 2018-05-18 MED ORDER — MIRTAZAPINE 15 MG PO TABS
30.0000 mg | ORAL_TABLET | Freq: Every day | ORAL | Status: DC
  Administered 2018-05-18 – 2018-05-19 (×3): 30 mg via ORAL
  Filled 2018-05-18 (×4): qty 2

## 2018-05-18 MED ORDER — GABAPENTIN 300 MG PO CAPS
600.0000 mg | ORAL_CAPSULE | Freq: Three times a day (TID) | ORAL | Status: DC
  Administered 2018-05-18 – 2018-05-20 (×8): 600 mg via ORAL
  Filled 2018-05-18 (×8): qty 2

## 2018-05-18 MED ORDER — FLUTICASONE PROPIONATE 50 MCG/ACT NA SUSP
1.0000 | Freq: Every day | NASAL | Status: DC
  Administered 2018-05-19 – 2018-05-20 (×2): 1 via NASAL
  Filled 2018-05-18: qty 16

## 2018-05-18 MED ORDER — LISINOPRIL 20 MG PO TABS
40.0000 mg | ORAL_TABLET | Freq: Every day | ORAL | Status: DC
  Administered 2018-05-18 – 2018-05-20 (×3): 40 mg via ORAL
  Filled 2018-05-18 (×3): qty 2

## 2018-05-18 MED ORDER — IPRATROPIUM BROMIDE 0.06 % NA SOLN
2.0000 | Freq: Four times a day (QID) | NASAL | Status: DC
  Administered 2018-05-18 – 2018-05-20 (×4): 2 via NASAL
  Filled 2018-05-18: qty 15

## 2018-05-18 MED ORDER — MONTELUKAST SODIUM 10 MG PO TABS
10.0000 mg | ORAL_TABLET | Freq: Every day | ORAL | Status: DC
  Administered 2018-05-18 – 2018-05-20 (×3): 10 mg via ORAL
  Filled 2018-05-18 (×3): qty 1

## 2018-05-18 MED ORDER — HYDROXYZINE HCL 25 MG PO TABS
25.0000 mg | ORAL_TABLET | Freq: Three times a day (TID) | ORAL | Status: DC | PRN
  Administered 2018-05-19: 25 mg via ORAL
  Filled 2018-05-18: qty 1

## 2018-05-18 NOTE — Progress Notes (Signed)
PROGRESS NOTE    Tammy Schwartz  ZOX:[id number] DOB: November 19, 1955 DOA: 05/17/2018 PCP: Hoy Register, MD   Brief Narrative: Patient is a 62 year old female with past medical history of hypertension, hyperlipidemia, COPD who presents to the emergency department with complaints of right foot pain, drainage for past 2 days.  Patient stated that there was redness on the dorsum of the right foot which progressed to ulcer.  Denies any trauma.  Patient currently being managed for cellulitis/abscess of the right foot between fourth and fifth toe.  Started on antibiotics.  Assessment & Plan:   Principal Problem:   Cellulitis Active Problems:   Essential hypertension   Chronic obstructive pulmonary disease (HCC)  Cellulitis/abscess/ulcer: Started on broad-spectrum antibiotics with vancomycin, cefepime .  Her white cell counts are stable.  She is afebrile this morning. Cellulitis/ulcer looks dry and significantly better today but she still has a lot of pain and slight swelling of the right foot.  There is no foul smell.  Blood cultures, wound cultures have been sent. If the right foot continues to improve tomorrow, probably we can change antibiotics to oral and discharge her.  I have requested for wound care evaluation. She denies any trauma to the foot.  Denies any history of tinea pedes.  Hypertension: On lisinopril and hydrochlorothiazide at home which we will continue  COPD: Continue her home inhalers.  Currently COPD stable.  No wheezes.  Chronic back pain: Continue her home meds.  Supportive care.  Mood disorder: On Cymbalta and Remeron at home.  DVT prophylaxis: Lovenox Code Status: Full Family Communication: None present at the bedside Disposition Plan: Likely home tomorrow if there is improvement in the right foot cellulitis   Consultants: None  Procedures: None  Antimicrobials: Vancomycin and cefepime   Subjective: Patient seen and examined the bedside this morning.  Still  complains of significant pain on the right foot.  Afebrile.  Hemodynamically stable.   Objective: Vitals:   05/18/18 0500 05/18/18 0617 05/18/18 1114 05/18/18 1115  BP:  (!) 147/68    Pulse:  72    Resp:  17    Temp:  98.8 F (37.1 C)    TempSrc:  Oral    SpO2:  97% 98% (!) 88%  Weight: 58.3 kg (128 lb 8.5 oz)     Height:        Intake/Output Summary (Last 24 hours) at 05/18/2018 1405 Last data filed at 05/18/2018 1354 Gross per 24 hour  Intake 1471.26 ml  Output 875 ml  Net 596.26 ml   Filed Weights   05/17/18 1705 05/18/18 0500  Weight: 54.4 kg (120 lb) 58.3 kg (128 lb 8.5 oz)    Examination:  General exam: Appears calm and comfortable ,Not in distress,average built HEENT:PERRL,Oral mucosa moist, Ear/Nose normal on gross exam Respiratory system: Bilateral equal air entry, normal vesicular breath sounds, no wheezes or crackles  Cardiovascular system: S1 & S2 heard, RRR. No JVD, murmurs, rubs, gallops or clicks. No pedal edema. Gastrointestinal system: Abdomen is nondistended, soft and nontender. No organomegaly or masses felt. Normal bowel sounds heard. Central nervous system: Alert and oriented. No focal neurological deficits. Extremities: No edema, no clubbing ,no cyanosis, distal peripheral pulses palpable. Swelling of the right foot.  Wound/ulcer in between fourth and fifth right toe Skin: No rashes, lesions or ulcers,no icterus ,no pallor MSK: Normal muscle bulk,tone ,power Psychiatry: Judgement and insight appear normal. Mood & affect appropriate.     Data Reviewed: I have personally reviewed following labs and  imaging studies  CBC: Recent Labs  Lab 05/17/18 1913 05/18/18 0417  WBC 6.6 5.6  NEUTROABS 2.6  --   HGB 12.9 12.2  HCT 37.4 34.8*  MCV 85.8 83.9  PLT 261 226   Basic Metabolic Panel: Recent Labs  Lab 05/17/18 1913 05/18/18 0417  NA 143 143  K 3.7 3.3*  CL 107 108  CO2 26 27  GLUCOSE 77 81  BUN 8 10  CREATININE 0.94 1.05*  CALCIUM 9.4 8.9    GFR: Estimated Creatinine Clearance: 45 mL/min (A) (by C-G formula based on SCr of 1.05 mg/dL (H)). Liver Function Tests: Recent Labs  Lab 05/18/18 0417  AST 26  ALT 24  ALKPHOS 52  BILITOT 0.8  PROT 6.3*  ALBUMIN 3.5   No results for input(s): LIPASE, AMYLASE in the last 168 hours. No results for input(s): AMMONIA in the last 168 hours. Coagulation Profile: No results for input(s): INR, PROTIME in the last 168 hours. Cardiac Enzymes: No results for input(s): CKTOTAL, CKMB, CKMBINDEX, TROPONINI in the last 168 hours. BNP (last 3 results) No results for input(s): PROBNP in the last 8760 hours. HbA1C: No results for input(s): HGBA1C in the last 72 hours. CBG: No results for input(s): GLUCAP in the last 168 hours. Lipid Profile: No results for input(s): CHOL, HDL, LDLCALC, TRIG, CHOLHDL, LDLDIRECT in the last 72 hours. Thyroid Function Tests: No results for input(s): TSH, T4TOTAL, FREET4, T3FREE, THYROIDAB in the last 72 hours. Anemia Panel: No results for input(s): VITAMINB12, FOLATE, FERRITIN, TIBC, IRON, RETICCTPCT in the last 72 hours. Sepsis Labs: No results for input(s): PROCALCITON, LATICACIDVEN in the last 168 hours.  Recent Results (from the past 240 hour(s))  Aerobic/Anaerobic Culture (surgical/deep wound)     Status: None (Preliminary result)   Collection Time: 05/17/18  8:36 PM  Result Value Ref Range Status   Specimen Description   Final    FOOT RIGHT Performed at [facility], [address] [place], [place] [address]    Special Requests   Final    NONE Performed at [facility], [address] [place], [place] [address]    Gram Stain   Final    ABUNDANT WBC PRESENT,BOTH PMN AND MONONUCLEAR RARE GRAM POSITIVE COCCI Performed at [facility], [address], [place], [place] [address]    Culture PENDING  Incomplete   Report Status PENDING  Incomplete  Culture, blood (routine x 2)     Status: None  (Preliminary result)   Collection Time: 05/17/18  8:36 PM  Result Value Ref Range Status   Specimen Description   Final    BLOOD RIGHT ANTECUBITAL Performed at [facility], [address], [place], [place] [address]    Special Requests   Final    BOTTLES DRAWN AEROBIC AND ANAEROBIC Blood Culture adequate volume Performed at [facility], [address], [place], [place] [address]    Culture   Final    NO GROWTH < 24 HOURS Performed at [facility], [address], [place], [place] [address]    Report Status PENDING  Incomplete  Culture, blood (routine x 2)     Status: None (Preliminary result)   Collection Time: 05/17/18  8:36 PM  Result Value Ref Range Status   Specimen Description   Final    BLOOD BLOOD LEFT FOREARM Performed at [facility], [address], [place], [place] [address]    Special Requests  Final    BOTTLES DRAWN AEROBIC AND ANAEROBIC Blood Culture adequate volume Performed at [facility], [address] [place], [place] [address]    Culture   Final    NO GROWTH < 24 HOURS Performed at [facility], [address] [place], [place] [address]    Report Status PENDING  Incomplete         Radiology Studies: Dg Foot Complete Right  Result Date: 05/17/2018 CLINICAL DATA:  Worsening foot infection after antibiotics for 3 days. Painful blister between fourth and fifth toes with diffuse swelling. EXAM: RIGHT FOOT COMPLETE - 3+ VIEW COMPARISON:  None. FINDINGS: No fracture dislocation. No bony erosion. Soft tissue swelling noted. IMPRESSION: No bony erosion.  Soft tissue swelling. Electronically Signed   By: Gerome Samavid  Williams III M.D   On: 05/17/2018 19:14        Scheduled Meds: . DULoxetine  60 mg Oral Daily  . enoxaparin (LOVENOX) injection  40 mg Subcutaneous Daily  . fluticasone  1 spray Each Nare Daily  . fluticasone furoate-vilanterol  1 puff Inhalation Daily  .  gabapentin  600 mg Oral TID  . lisinopril  40 mg Oral Daily   And  . hydrochlorothiazide  25 mg Oral Daily  . ipratropium  2 spray Each Nare QID  . mirtazapine  30 mg Oral QHS  . montelukast  10 mg Oral Daily  . polyethylene glycol  17 g Oral Daily  . potassium chloride  40 mEq Oral Once  . traMADol  50 mg Oral QHS   Continuous Infusions: . ceFEPime (MAXIPIME) IV 1 g (05/18/18 1307)  . vancomycin       LOS: 1 day    Time spent: 25 mins.More than 50% of that time was spent in counseling and/or coordination of care.      Burnadette PopAmrit Taja Pentland, MD [facility] Pager [phone]  If 7PM-7AM, please contact night-coverage [email or web address] Password [email or web address] 05/18/2018, 2:05 PM

## 2018-05-18 NOTE — Progress Notes (Signed)
Late Entry.  Patient arrived on 5W around 2330 with fresh dressing on right 4th and 5th toest, from early evening I&D of right foot d/t cellulitis.  Patient was medicated for pain to foot.  Marland Kitchen.Antibiotic given as ordered. Patient with good movement, color and sensation on  surrounding toes. VSS.  Patient rested well.

## 2018-05-19 DIAGNOSIS — J449 Chronic obstructive pulmonary disease, unspecified: Secondary | ICD-10-CM

## 2018-05-19 DIAGNOSIS — L03115 Cellulitis of right lower limb: Principal | ICD-10-CM

## 2018-05-19 DIAGNOSIS — I10 Essential (primary) hypertension: Secondary | ICD-10-CM

## 2018-05-19 LAB — BASIC METABOLIC PANEL
ANION GAP: 6 (ref 5–15)
BUN: 10 mg/dL (ref 8–23)
CO2: 25 mmol/L (ref 22–32)
Calcium: 8.7 mg/dL — ABNORMAL LOW (ref 8.9–10.3)
Chloride: 111 mmol/L (ref 98–111)
Creatinine, Ser: 0.84 mg/dL (ref 0.44–1.00)
GFR calc non Af Amer: >60 mL/min (ref >60)
GLUCOSE: 97 mg/dL (ref 70–99)
Potassium: 3.7 mmol/L (ref 3.5–5.1)
Sodium: 142 mmol/L (ref 135–145)

## 2018-05-19 MED ORDER — DIPHENHYDRAMINE-ZINC ACETATE 2-0.1 % EX CREA
TOPICAL_CREAM | Freq: Three times a day (TID) | CUTANEOUS | Status: DC | PRN
  Filled 2018-05-19: qty 28

## 2018-05-19 MED ORDER — NICOTINE 21 MG/24HR TD PT24
21.0000 mg | MEDICATED_PATCH | Freq: Every day | TRANSDERMAL | Status: DC
  Administered 2018-05-19 – 2018-05-20 (×2): 21 mg via TRANSDERMAL
  Filled 2018-05-19 (×2): qty 1

## 2018-05-19 MED ORDER — FLUCONAZOLE 150 MG PO TABS
150.0000 mg | ORAL_TABLET | Freq: Every day | ORAL | Status: DC
  Administered 2018-05-19 – 2018-05-20 (×2): 150 mg via ORAL
  Filled 2018-05-19 (×2): qty 1

## 2018-05-19 MED ORDER — TRAMADOL HCL 50 MG PO TABS
50.0000 mg | ORAL_TABLET | Freq: Two times a day (BID) | ORAL | Status: DC
  Administered 2018-05-19 – 2018-05-20 (×2): 50 mg via ORAL
  Filled 2018-05-19 (×2): qty 1

## 2018-05-19 NOTE — Progress Notes (Signed)
PROGRESS NOTE  Tammy MacadamVerna S Schwartz ZOX:[id number] DOB: 02/21/1956 DOA: 05/17/2018 PCP: Hoy RegisterNewlin, Enobong, MD  HPI/Recap of past 8224 hours: 62 year old female with past medical history significant for hypertension, hyperlipidemia, COPD, presents to the ER complaining of right foot pain with drainage for the past 2 days.  Patient denies any trauma.  Patient currently being managed for cellulitis/abscess of the right foods between fourth and fifth toe.  Patient admitted for further management.   Today, patient still reports significant tenderness between fourth and fifth toe.  Reported swelling and redness on dorsum of right foot has improved.  Denies any fever/chills, abdominal pain, dysuria, chest pain, shortness of breath.  Assessment/Plan: Principal Problem:   Cellulitis Active Problems:   Essential hypertension   Chronic obstructive pulmonary disease (HCC)  Right foot cellulitis with infected interdigit 4th and 5th toe/tinea pedes Afebrile, with no leukocytosis BC X 2 NGTD Wound cultures pending X-ray of right foot showed soft tissue swelling Continue IV cefepime, started on Diflucan D/C vancomycin Wound care on board Monitor closely  Hypertension Continue lisinopril, hydrochlorothiazide  COPD Stable Continue home inhalers  Chronic back pain Continue home meds  Mood disorder Continue home Cymbalta and Remeron     Code Status: Full  Family Communication: Son at bedside  Disposition Plan: Home likely 05/20/2018   Consultants:  None  Procedures:  None  Antimicrobials:  Cefepime  Diflucan  DVT prophylaxis: Lovenox   Objective: Vitals:   05/18/18 2202 05/19/18 0527 05/19/18 0944 05/19/18 1427  BP: (!) 145/80 107/76  (!) 144/97  Pulse: 73 77 81 81  Resp: 18 18 17 16   Temp: 98.6 F (37 C) 97.8 F (36.6 C)  98.7 F (37.1 C)  TempSrc: Oral Oral  Oral  SpO2: 100% 97% 96% 99%  Weight:      Height:        Intake/Output Summary (Last 24 hours) at  05/19/2018 1512 Last data filed at 05/19/2018 1425 Gross per 24 hour  Intake 1704.83 ml  Output 600 ml  Net 1104.83 ml   Filed Weights   05/17/18 1705 05/18/18 0500  Weight: 54.4 kg (120 lb) 58.3 kg (128 lb 8.5 oz)    Exam:   General:  NAD   Cardiovascular: S1, S2 present  Respiratory: CTAB  Abdomen: Soft, NT, ND, BS present  Musculoskeletal: No pedal edema, swelling of right foot, ulcer in between fourth and fifth right toe  Skin: Normal  Psychiatry: Normal mood  Data Reviewed: CBC: Recent Labs  Lab 05/17/18 1913 05/18/18 0417  WBC 6.6 5.6  NEUTROABS 2.6  --   HGB 12.9 12.2  HCT 37.4 34.8*  MCV 85.8 83.9  PLT 261 226   Basic Metabolic Panel: Recent Labs  Lab 05/17/18 1913 05/18/18 0417 05/19/18 0500  NA 143 143 142  K 3.7 3.3* 3.7  CL 107 108 111  CO2 26 27 25   GLUCOSE 77 81 97  BUN 8 10 10   CREATININE 0.94 1.05* 0.84  CALCIUM 9.4 8.9 8.7*   GFR: Estimated Creatinine Clearance: 56.3 mL/min (by C-G formula based on SCr of 0.84 mg/dL). Liver Function Tests: Recent Labs  Lab 05/18/18 0417  AST 26  ALT 24  ALKPHOS 52  BILITOT 0.8  PROT 6.3*  ALBUMIN 3.5   No results for input(s): LIPASE, AMYLASE in the last 168 hours. No results for input(s): AMMONIA in the last 168 hours. Coagulation Profile: No results for input(s): INR, PROTIME in the last 168 hours. Cardiac Enzymes: No results for input(s): CKTOTAL,  CKMB, CKMBINDEX, TROPONINI in the last 168 hours. BNP (last 3 results) No results for input(s): PROBNP in the last 8760 hours. HbA1C: No results for input(s): HGBA1C in the last 72 hours. CBG: No results for input(s): GLUCAP in the last 168 hours. Lipid Profile: No results for input(s): CHOL, HDL, LDLCALC, TRIG, CHOLHDL, LDLDIRECT in the last 72 hours. Thyroid Function Tests: No results for input(s): TSH, T4TOTAL, FREET4, T3FREE, THYROIDAB in the last 72 hours. Anemia Panel: No results for input(s): VITAMINB12, FOLATE, FERRITIN, TIBC,  IRON, RETICCTPCT in the last 72 hours. Urine analysis:    Component Value Date/Time   LABSPEC 1.020 11/20/2017 2006   PHURINE 7.0 11/20/2017 2006   GLUCOSEU NEGATIVE 11/20/2017 2006   HGBUR LARGE (A) 11/20/2017 2006   BILIRUBINUR NEGATIVE 11/20/2017 2006   KETONESUR NEGATIVE 11/20/2017 2006   PROTEINUR 30 (A) 11/20/2017 2006   UROBILINOGEN 0.2 11/20/2017 2006   NITRITE NEGATIVE 11/20/2017 2006   LEUKOCYTESUR NEGATIVE 11/20/2017 2006   Sepsis Labs: @LABRCNTIP (procalcitonin:4,lacticidven:4)  ) Recent Results (from the past 240 hour(s))  Aerobic/Anaerobic Culture (surgical/deep wound)     Status: None (Preliminary result)   Collection Time: 05/17/18  8:36 PM  Result Value Ref Range Status   Specimen Description   Final    FOOT RIGHT Performed at [facility], [address], [place], [place] [address]    Special Requests   Final    NONE Performed at [facility], [address], [place], [place] [address]    Gram Stain   Final    ABUNDANT WBC PRESENT,BOTH PMN AND MONONUCLEAR RARE GRAM POSITIVE COCCI Performed at [facility], [address], [place], [place] [address]    Culture   Final    MODERATE STAPHYLOCOCCUS SPECIES (COAGULASE NEGATIVE)   Report Status PENDING  Incomplete  Culture, blood (routine x 2)     Status: None (Preliminary result)   Collection Time: 05/17/18  8:36 PM  Result Value Ref Range Status   Specimen Description   Final    BLOOD RIGHT ANTECUBITAL Performed at [facility], [address], [place], [place] [address]    Special Requests   Final    BOTTLES DRAWN AEROBIC AND ANAEROBIC Blood Culture adequate volume Performed at [facility], [address], [place], [place] [address]    Culture   Final    NO GROWTH 2 DAYS Performed at [facility], [address] [place], [place] [address]    Report Status PENDING  Incomplete  Culture, blood (routine x 2)      Status: None (Preliminary result)   Collection Time: 05/17/18  8:36 PM  Result Value Ref Range Status   Specimen Description   Final    BLOOD BLOOD LEFT FOREARM Performed at [facility], [address] [place], [place] [address]    Special Requests   Final    BOTTLES DRAWN AEROBIC AND ANAEROBIC Blood Culture adequate volume Performed at [facility], [address], [place], [place] [address]    Culture   Final    NO GROWTH 2 DAYS Performed at [facility], [address], [place], [place] [address]    Report Status PENDING  Incomplete      Studies: No results found.  Scheduled Meds: . DULoxetine  60 mg Oral Daily  . enoxaparin (LOVENOX) injection  40 mg Subcutaneous Daily  . fluconazole  150 mg Oral Daily  . fluticasone  1 spray Each Nare Daily  . fluticasone furoate-vilanterol  1 puff Inhalation Daily  . gabapentin  600 mg Oral TID  . lisinopril  40 mg Oral Daily   And  . hydrochlorothiazide  25 mg Oral Daily  . ipratropium  2 spray Each Nare QID  . mirtazapine  30 mg Oral QHS  . montelukast  10 mg Oral Daily  . nicotine  21 mg Transdermal Daily  . polyethylene glycol  17 g Oral Daily  . traMADol  50 mg Oral QHS    Continuous Infusions: . ceFEPime (MAXIPIME) IV 1 g (05/19/18 1355)  . vancomycin 750 mg (05/18/18 2025)     LOS: 2 days     Briant Cedar, MD [facility]   If 7PM-7AM, please contact night-coverage [email or web address] Password [email or web address] 05/19/2018, 3:12 PM

## 2018-05-19 NOTE — Consult Note (Signed)
WOC Nurse wound consult note Reason for Consult: Right foot lesions Wound type: moisture, specifically intertriginous (intradigital) dermatitis.  Suspected fungal. Pressure Injury POA: N/A Measurement: area of involvement is between the 4th and 5th intra digital spaces and presents a moist white/yellow wound bed.  Patient reports severe itching. Wound bed:as described above Drainage (amount, consistency, odor) small amount yellow exudate on sock. No active moisture/educate Periwound: erythematous (improving with systemic antibiotics for cellulitis), edematous (also improving with elevation) Dressing procedure/placement/frequency: I will implement a POC that includes daily cleansing and thorough (but gentle) drying followed by placement of our house antimicrobial textile (InterDry Ag+. Patient is amenable to this intervention.  If you agree, she would also benefit from a systemic antifungal (e.g., diflucan). If you agree, please order.  WOC nursing team will not follow, but will remain available to this patient, the nursing and medical teams.  Please re-consult if needed. Thanks, Ladona MowLaurie Thomas Mabry, MSN, RN, GNP, Hans EdenCWOCN, CWON-AP, FAAN  Pager# [phone]

## 2018-05-20 DIAGNOSIS — B353 Tinea pedis: Secondary | ICD-10-CM

## 2018-05-20 LAB — CBC WITH DIFFERENTIAL/PLATELET
BASOS ABS: 0 10*3/uL (ref 0.0–0.1)
Basophils Relative: 0 %
EOS PCT: 5 %
Eosinophils Absolute: 0.3 10*3/uL (ref 0.0–0.7)
HCT: 37.5 % (ref 36.0–46.0)
Hemoglobin: 12.9 g/dL (ref 12.0–15.0)
LYMPHS ABS: 2.1 10*3/uL (ref 0.7–4.0)
LYMPHS PCT: 37 %
MCH: 30 pg (ref 26.0–34.0)
MCHC: 34.4 g/dL (ref 30.0–36.0)
MCV: 87.2 fL (ref 78.0–100.0)
MONO ABS: 0.4 10*3/uL (ref 0.1–1.0)
Monocytes Relative: 7 %
NEUTROS ABS: 2.8 10*3/uL (ref 1.7–7.7)
Neutrophils Relative %: 51 %
Platelets: 245 10*3/uL (ref 150–400)
RBC: 4.3 MIL/uL (ref 3.87–5.11)
RDW: 15.2 % (ref 11.5–15.5)
WBC: 5.6 10*3/uL (ref 4.0–10.5)

## 2018-05-20 LAB — BASIC METABOLIC PANEL
ANION GAP: 12 (ref 5–15)
BUN: 9 mg/dL (ref 8–23)
CHLORIDE: 105 mmol/L (ref 98–111)
CO2: 27 mmol/L (ref 22–32)
Calcium: 9.6 mg/dL (ref 8.9–10.3)
Creatinine, Ser: 0.77 mg/dL (ref 0.44–1.00)
GFR calc Af Amer: >60 mL/min (ref >60)
GLUCOSE: 93 mg/dL (ref 70–99)
POTASSIUM: 3.4 mmol/L — AB (ref 3.5–5.1)
Sodium: 144 mmol/L (ref 135–145)

## 2018-05-20 MED ORDER — FLUCONAZOLE 150 MG PO TABS: 150.0000 mg | ORAL_TABLET | ORAL | 0 refills | Status: AC

## 2018-05-20 MED ORDER — DOXYCYCLINE HYCLATE 100 MG PO CAPS: 100.0000 mg | ORAL_CAPSULE | Freq: Two times a day (BID) | ORAL | 0 refills | Status: AC

## 2018-05-20 MED ORDER — CEFEPIME HCL 1 G IJ SOLR
1.0000 g | Freq: Three times a day (TID) | INTRAMUSCULAR | Status: DC
  Administered 2018-05-20: 1 g via INTRAVENOUS
  Filled 2018-05-20 (×2): qty 1

## 2018-05-20 MED FILL — FLUCONAZOLE 150 MG TABS: 150 | 30 days supply | Qty: 4 | Fill #0

## 2018-05-20 MED FILL — DOXYCYCLINE HYCLATE 100 MG: 100 | 5 days supply | Qty: 10 | Fill #0

## 2018-05-20 NOTE — Progress Notes (Signed)
Pharmacy Antibiotic Note  Tammy MacadamVerna S Schwartz is a 62 y.o. female admitted on 05/17/2018 with cellulitis.  Pharmacy has been consulted for vancomycin and cefepime dosing.  Today, 05/20/18  Day 3 antibiotics  Afebrile.   WBC and SCr stable  Right foot wound culture: Coag Neg Staph - resistant only to Bactrim  Plan:  Per current renal function, will change cefepime from 1g IV q12 to 1g IV q8; however, based on current culture results, would suggest narrowing cefepime to PO clindamycin, doxycycline, or Cipro.    Height: 5\' 1"  (154.9 cm) Weight: 126 lb 1.7 oz (57.2 kg) IBW/kg (Calculated) : 47.8  Temp (24hrs), Avg:98.1 F (36.7 C), Min:97.7 F (36.5 C), Max:98.7 F (37.1 C)  Recent Labs  Lab 05/17/18 1913 05/18/18 0417 05/19/18 0500 05/20/18 0457  WBC 6.6 5.6  --  5.6  CREATININE 0.94 1.05* 0.84 0.77    Estimated Creatinine Clearance: 54.3 mL/min (by C-G formula based on SCr of 0.77 mg/dL).    No Known Allergies  Antimicrobials this admission: 8/5 zosyn >> x1  8/6 cefepime >>  8/5 vancomycin >>  Dose adjustments this admission:   Microbiology results: 8/5 BCx: ngtd 8/5 wound: mod CNS - resistant only to Bactrim  Thank you for allowing pharmacy to be a part of this patient's care.  Hessie KnowsJustin M Osiah Haring, PharmD, BCPS Pager [phone] 05/20/2018 10:47 AM

## 2018-05-20 NOTE — Discharge Summary (Signed)
Discharge Summary  Tammy Schwartz [id number] DOB: 03/22/1956  PCP: Hoy RegisterNewlin, Enobong, MD  Admit date: 05/17/2018 Discharge date: 05/20/2018  Time spent: 35 mins  Recommendations for Outpatient Follow-up:  1. PCP   Discharge Diagnoses:  Active Hospital Problems   Diagnosis Date Noted  . Cellulitis 05/17/2018  . Essential hypertension   . Chronic obstructive pulmonary disease [facility](HCC)     Resolved Hospital Problems  No resolved problems to display.    Discharge Condition: Stable  Diet recommendation: Heart healthy  Vitals:   05/20/18 0600 05/20/18 0934  BP: (!) 144/78   Pulse: 78 89  Resp:  16  Temp:    SpO2:  100%    History of present illness:  62 year old female with past medical history significant for hypertension, hyperlipidemia, COPD, presents to the ER complaining of right foot pain with drainage for the past 2 days.  Patient denies any trauma.  Patient currently being managed for cellulitis/abscess of the right foods between fourth and fifth toe.  Patient admitted for further management.  Today, patient reports some improvement with pain between fourth and fifth toe. Swelling and redness of R foot significantly resolved. Denies any new complaints. No fever noted  Hospital Course:  Principal Problem:   Cellulitis Active Problems:   Essential hypertension   Chronic obstructive pulmonary disease (HCC)  Right interdigit tinea pedis with super-imposed bacterial infection Noted infected interdigit (between 4th and 5th toe) Afebrile, with no leukocytosis BC X 2 NGTD Wound cultures growing coagulase negative staph, resistance to bactrim X-ray of right foot showed soft tissue swelling S/P IV cefepime, Vanc Discharge pt on 5 days of PO doxycycline for a total of 7 days of AB and also once weekly PO fluconazole 150 mg for 4 weeks Wound care consulted, appreciate recs Follow up with PCP  Hypertension Continue lisinopril,  hydrochlorothiazide  COPD Stable Continue home inhalers  Chronic back pain Continue home meds  Mood disorder Continue home Cymbalta and Remeron  Procedures:  None   Consultations:  None  Discharge Exam: BP (!) 144/78 (BP Location: Right Arm)   Pulse 89   Temp 97.7 F (36.5 C) (Oral)   Resp 16   Ht 5\' 1"  (1.549 m)   Wt 57.2 kg   LMP 03/17/2012   SpO2 100%   BMI 23.83 kg/m   General: NAD Cardiovascular: S1, S2 present Respiratory: CTAB  Discharge Instructions You were cared for by a hospitalist during your hospital stay. If you have any questions about your discharge medications or the care you received while you were in the hospital after you are discharged, you can call the unit and asked to speak with the hospitalist on call if the hospitalist that took care of you is not available. Once you are discharged, your primary care physician will handle any further medical issues. Please note that NO REFILLS for any discharge medications will be authorized once you are discharged, as it is imperative that you return to your primary care physician (or establish a relationship with a primary care physician if you do not have one) for your aftercare needs so that they can reassess your need for medications and monitor your lab values.   Allergies as of 05/20/2018   No Known Allergies     Medication List    STOP taking these medications   hydrocortisone-pramoxine rectal foam Commonly known as:  PROCTOFOAM-HC     TAKE these medications   acetaminophen 325 MG tablet Commonly known as:  TYLENOL Take 2 tablets (  650 mg total) by mouth every 6 (six) hours as needed.   benzonatate 100 MG capsule Commonly known as:  TESSALON TAKE 1 CAPSULE BY MOUTH 2 (TWO) TIMES DAILY AS NEEDED FOR COUGH.   cetirizine 10 MG tablet Commonly known as:  ZYRTEC Take 1 tablet (10 mg total) by mouth daily.   diclofenac 75 MG EC tablet Commonly known as:  VOLTAREN Take 1 tablet (75 mg  total) by mouth 2 (two) times daily. Prn pain   doxycycline 100 MG capsule Commonly known as:  VIBRAMYCIN Take 1 capsule (100 mg total) by mouth 2 (two) times daily for 5 days.   DULoxetine 60 MG capsule Commonly known as:  CYMBALTA Take 1 capsule (60 mg total) by mouth daily.   fluconazole 150 MG tablet Commonly known as:  DIFLUCAN Take 1 tablet (150 mg total) by mouth once a week for 4 doses. Start taking on:  05/27/2018   fluticasone 50 MCG/ACT nasal spray Commonly known as:  FLONASE Place 1 spray into both nostrils daily.   fluticasone furoate-vilanterol 100-25 MCG/INH Aepb Commonly known as:  BREO ELLIPTA Inhale 1 puff into the lungs daily.   gabapentin 300 MG capsule Commonly known as:  NEURONTIN Take 2 capsules (600 mg total) by mouth 3 (three) times daily.   hydrOXYzine 25 MG tablet Commonly known as:  ATARAX/VISTARIL Take 1 tablet (25 mg total) by mouth every 8 (eight) hours as needed.   ipratropium 0.06 % nasal spray Commonly known as:  ATROVENT Place 2 sprays into both nostrils 4 (four) times daily.   lactulose 10 GM/15ML solution Commonly known as:  CHRONULAC Take 15 mLs (10 g total) by mouth 2 (two) times daily as needed for mild constipation.   lisinopril-hydrochlorothiazide 20-12.5 MG tablet Commonly known as:  PRINZIDE,ZESTORETIC Take 2 tablets by mouth daily.   mirtazapine 30 MG tablet Commonly known as:  REMERON Take 1 tablet (30 mg total) by mouth at bedtime.   montelukast 10 MG tablet Commonly known as:  SINGULAIR Take 10 mg by mouth daily.   multivitamin with minerals Tabs tablet Take 1 tablet by mouth daily.   polyethylene glycol powder powder Commonly known as:  GLYCOLAX/MIRALAX Take 17 g by mouth daily.   tiZANidine 4 MG tablet Commonly known as:  ZANAFLEX Take 1 tablet (4 mg total) by mouth every 8 (eight) hours as needed for muscle spasms.   traMADol 50 MG tablet Commonly known as:  ULTRAM Take 1 tablet (50 mg total) by mouth at  bedtime.   triamcinolone cream 0.1 % Commonly known as:  KENALOG APPLY 1 APPLICATION TOPICALLY 2 TIMES DAILY.   VENTOLIN HFA 108 (90 Base) MCG/ACT inhaler Generic drug:  albuterol Inhale 2 puffs into the lungs every 6 (six) hours as needed.      No Known Allergies Follow-up Information    Hoy Register, MD. Schedule an appointment as soon as possible for a visit in 1 week(s).   Specialty:  Family Medicine Contact information: [address] [place] [address] [phone]            The results of significant diagnostics from this hospitalization (including imaging, microbiology, ancillary and laboratory) are listed below for reference.    Significant Diagnostic Studies: Dg Foot Complete Right  Result Date: 05/17/2018 CLINICAL DATA:  Worsening foot infection after antibiotics for 3 days. Painful blister between fourth and fifth toes with diffuse swelling. EXAM: RIGHT FOOT COMPLETE - 3+ VIEW COMPARISON:  None. FINDINGS: No fracture dislocation. No bony erosion.  Soft tissue swelling noted. IMPRESSION: No bony erosion.  Soft tissue swelling. Electronically Signed   By: Gerome Sam III M.D   On: 05/17/2018 19:14    Microbiology: Recent Results (from the past 240 hour(s))  Aerobic/Anaerobic Culture (surgical/deep wound)     Status: None (Preliminary result)   Collection Time: 05/17/18  8:36 PM  Result Value Ref Range Status   Specimen Description   Final    FOOT RIGHT Performed at [facility], [address], [place], [place] [address]    Special Requests   Final    NONE Performed at [facility], [address], [place], [place] [address]    Gram Stain   Final    ABUNDANT WBC PRESENT,BOTH PMN AND MONONUCLEAR RARE GRAM POSITIVE COCCI Performed at [facility], [address], [place], [place] [address]    Culture   Final    MODERATE STAPHYLOCOCCUS SPECIES (COAGULASE NEGATIVE) NO ANAEROBES ISOLATED; CULTURE IN  PROGRESS FOR 5 DAYS    Report Status PENDING  Incomplete   Organism ID, Bacteria STAPHYLOCOCCUS SPECIES (COAGULASE NEGATIVE)  Final      Susceptibility   Staphylococcus species (coagulase negative) - MIC*    CIPROFLOXACIN <=0.5 SENSITIVE Sensitive     ERYTHROMYCIN <=0.25 SENSITIVE Sensitive     GENTAMICIN <=0.5 SENSITIVE Sensitive     OXACILLIN <=0.25 SENSITIVE Sensitive     TETRACYCLINE 2 SENSITIVE Sensitive     VANCOMYCIN 2 SENSITIVE Sensitive     TRIMETH/SULFA 160 RESISTANT Resistant     CLINDAMYCIN <=0.25 SENSITIVE Sensitive     RIFAMPIN <=0.5 SENSITIVE Sensitive     Inducible Clindamycin NEGATIVE Sensitive     * MODERATE STAPHYLOCOCCUS SPECIES (COAGULASE NEGATIVE)  Culture, blood (routine x 2)     Status: None (Preliminary result)   Collection Time: 05/17/18  8:36 PM  Result Value Ref Range Status   Specimen Description   Final    BLOOD RIGHT ANTECUBITAL Performed at [facility], [address] [place], [place] [address]    Special Requests   Final    BOTTLES DRAWN AEROBIC AND ANAEROBIC Blood Culture adequate volume Performed at [facility], [address], [place], [place] [address]    Culture   Final    NO GROWTH 2 DAYS Performed at [facility], [address], [place], [place] [address]    Report Status PENDING  Incomplete  Culture, blood (routine x 2)     Status: None (Preliminary result)   Collection Time: 05/17/18  8:36 PM  Result Value Ref Range Status   Specimen Description   Final    BLOOD BLOOD LEFT FOREARM Performed at [facility], [address] [place], [place] [address]    Special Requests   Final    BOTTLES DRAWN AEROBIC AND ANAEROBIC Blood Culture adequate volume Performed at [facility], [address] [place], [place] [address]    Culture   Final    NO GROWTH 2 DAYS Performed at [facility], [address], [place], [place] [address]    Report Status  PENDING  Incomplete     Labs: Basic Metabolic Panel: Recent Labs  Lab 05/17/18 1913 05/18/18 0417 05/19/18 0500 05/20/18 0457  NA 143 143 142 144  K 3.7 3.3* 3.7 3.4*  CL 107 108 111 105  CO2 26 27 25 27   GLUCOSE 77 81 97 93  BUN 8 10 10 9   CREATININE  0.94 1.05* 0.84 0.77  CALCIUM 9.4 8.9 8.7* 9.6   Liver Function Tests: Recent Labs  Lab 05/18/18 0417  AST 26  ALT 24  ALKPHOS 52  BILITOT 0.8  PROT 6.3*  ALBUMIN 3.5   No results for input(s): LIPASE, AMYLASE in the last 168 hours. No results for input(s): AMMONIA in the last 168 hours. CBC: Recent Labs  Lab 05/17/18 1913 05/18/18 0417 05/20/18 0457  WBC 6.6 5.6 5.6  NEUTROABS 2.6  --  2.8  HGB 12.9 12.2 12.9  HCT 37.4 34.8* 37.5  MCV 85.8 83.9 87.2  PLT 261 226 245   Cardiac Enzymes: No results for input(s): CKTOTAL, CKMB, CKMBINDEX, TROPONINI in the last 168 hours. BNP: BNP (last 3 results) No results for input(s): BNP in the last 8760 hours.  ProBNP (last 3 results) No results for input(s): PROBNP in the last 8760 hours.  CBG: No results for input(s): GLUCAP in the last 168 hours.     Signed:  Briant Cedar, MD [facility] 05/20/2018, 11:19 AM

## 2018-05-20 NOTE — Progress Notes (Signed)
Assessment unchanged. Pt verbalized understanding of dc instructions through teach back. No scripts at dc. Discharged via wc to front entrance accompanied by NT and family.

## 2018-05-22 LAB — CULTURE, BLOOD (ROUTINE X 2)
Culture: NO GROWTH
Culture: NO GROWTH
SPECIAL REQUESTS: ADEQUATE
Special Requests: ADEQUATE

## 2018-05-23 LAB — AEROBIC/ANAEROBIC CULTURE W GRAM STAIN (SURGICAL/DEEP WOUND)

## 2018-05-23 LAB — AEROBIC/ANAEROBIC CULTURE (SURGICAL/DEEP WOUND)

## 2018-05-24 ENCOUNTER — Ambulatory Visit: Payer: No Typology Code available for payment source | Attending: Nurse Practitioner | Admitting: Nurse Practitioner

## 2018-05-24 ENCOUNTER — Encounter: Payer: Self-pay | Admitting: Nurse Practitioner

## 2018-05-24 VITALS — BP 145/88 | HR 99 | Temp 98.7°F | Resp 14 | Ht 61.0 in | Wt 118.4 lb

## 2018-05-24 DIAGNOSIS — G8929 Other chronic pain: Secondary | ICD-10-CM | POA: Insufficient documentation

## 2018-05-24 DIAGNOSIS — D573 Sickle-cell trait: Secondary | ICD-10-CM | POA: Insufficient documentation

## 2018-05-24 DIAGNOSIS — G4709 Other insomnia: Secondary | ICD-10-CM

## 2018-05-24 DIAGNOSIS — J449 Chronic obstructive pulmonary disease, unspecified: Secondary | ICD-10-CM | POA: Insufficient documentation

## 2018-05-24 DIAGNOSIS — Z79899 Other long term (current) drug therapy: Secondary | ICD-10-CM | POA: Insufficient documentation

## 2018-05-24 DIAGNOSIS — L03115 Cellulitis of right lower limb: Secondary | ICD-10-CM

## 2018-05-24 DIAGNOSIS — G4700 Insomnia, unspecified: Secondary | ICD-10-CM | POA: Insufficient documentation

## 2018-05-24 DIAGNOSIS — I10 Essential (primary) hypertension: Secondary | ICD-10-CM | POA: Insufficient documentation

## 2018-05-24 DIAGNOSIS — B353 Tinea pedis: Secondary | ICD-10-CM | POA: Insufficient documentation

## 2018-05-24 DIAGNOSIS — E785 Hyperlipidemia, unspecified: Secondary | ICD-10-CM | POA: Insufficient documentation

## 2018-05-24 MED ORDER — MIRTAZAPINE 30 MG PO TABS: 30.0000 mg | ORAL_TABLET | Freq: Every day | ORAL | 3 refills | Status: DC

## 2018-05-24 NOTE — Progress Notes (Signed)
Assessment & Plan:  Tammy MartenVerna was seen today for hospitalization follow-up.  Diagnoses and all orders for this visit:  Cellulitis of right lower extremity Improving. Continue fluconazole as prescribed.   Other insomnia -     mirtazapine (REMERON) 30 MG tablet; Take 1 tablet (30 mg total) by mouth at bedtime.    Patient has been counseled on age-appropriate routine health concerns for screening and prevention. These are reviewed and up-to-date. Referrals have been placed accordingly. Immunizations are up-to-date or declined.    Subjective:   Chief Complaint  Patient presents with  . Hospitalization Follow-up    Pt. is here for a hospitalization follow-up.   HPI Tammy MacadamVerna S Schwartz 62 y.o. female presents to office today for hospital follow up.   Right Foot Infection She was admitted to the hospital on 05-17-2018 with interdigital tinea pedis and superimposed bacterial infection of the right foot. She was initially noted for swelling, pain and skin changes between the 4th and 5th toe of the right foot. She was treated with IV cefepime and Vanc and discharged home on PO doxycycline for a total of 7 days and PO fluconazole 150mg  for 4 weeks. She will start the fluconazole in 3 days. Xray was negative for Osteomyelitis.   Today patient endorses improved symptoms. Swelling of the right foot has subsided, skin erosion between the 4th and 5th digits has improved and erythema is no longer present.   Review of Systems  Constitutional: Positive for weight loss. Negative for fever and malaise/fatigue.  HENT: Negative.  Negative for nosebleeds.   Eyes: Negative.  Negative for blurred vision, double vision and photophobia.  Respiratory: Positive for shortness of breath. Negative for cough.   Cardiovascular: Negative.  Negative for chest pain, palpitations and leg swelling.  Gastrointestinal: Negative.  Negative for heartburn, nausea and vomiting.  Musculoskeletal: Positive for back pain. Negative  for myalgias.  Skin: Positive for rash.  Neurological: Negative.  Negative for dizziness, focal weakness, seizures and headaches.  Psychiatric/Behavioral: Negative for suicidal ideas. The patient has insomnia.     Past Medical History:  Diagnosis Date  . Chronic back pain    HNp,spondylosis,radiculopathy  . COPD (chronic obstructive pulmonary disease) (HCC)   . Hyperlipidemia    was on choloesterol meds last yr-samples given in office but not needed since  . Hypertension    takes Exforge daily  . Joint pain   . Pneumonia    hx of;many yrs ago  . Shortness of breath    takes Singulair daily;with exertion  . Sickle cell trait (HCC)   . Spondylolysis   . Tobacco use     Past Surgical History:  Procedure Laterality Date  . BACK SURGERY    . BREAST EXCISIONAL BIOPSY Left 2007   benign  . BREAST SURGERY Left    lumpectomy  . COLONOSCOPY    . DILATION AND CURETTAGE OF UTERUS    . LUMBAR LAMINECTOMY/DECOMPRESSION MICRODISCECTOMY  06/04/2012   Procedure: LUMBAR LAMINECTOMY/DECOMPRESSION MICRODISCECTOMY 1 LEVEL;  Surgeon: Carmela HurtKyle L Cabbell, MD;  Location: [facility];  Service: Neurosurgery;  Laterality: Left;  LEFT Lumbar five-sacral one diskectomy  . LUMBAR LAMINECTOMY/DECOMPRESSION MICRODISCECTOMY Right 06/16/2013   Procedure: RIGHT Lumbar Five-Sacral One Microdiskectomy;  Surgeon: Carmela HurtKyle L Cabbell, MD;  Location: [facility];  Service: Neurosurgery;  Laterality: Right;  RIGHT Lumbar Five-Sacral One Microdiskectomy  . LUMBAR LAMINECTOMY/DECOMPRESSION MICRODISCECTOMY Right 01/20/2014   Procedure: LUMBAR FOUR TO LUMBAR FIVE LUMBAR LAMINECTOMY/DECOMPRESSION MICRODISCECTOMY 1 LEVEL;  Surgeon: Carmela HurtKyle L Cabbell, MD;  Location: [facility];  Service: Neurosurgery;  Laterality: Right;  Right L45 laminectomy and foramintomy    Family History  Problem Relation Age of Onset  . Cancer - Other Mother   . CAD Father   . Hypertension Sister     Social History Reviewed with no changes to be made  today.   Outpatient Medications Prior to Visit  Medication Sig Dispense Refill  . acetaminophen (TYLENOL) 325 MG tablet Take 2 tablets (650 mg total) by mouth every 6 (six) hours as needed. 30 tablet 0  . benzonatate (TESSALON) 100 MG capsule TAKE 1 CAPSULE BY MOUTH 2 (TWO) TIMES DAILY AS NEEDED FOR COUGH. 20 capsule 0  . cetirizine (ZYRTEC) 10 MG tablet Take 1 tablet (10 mg total) by mouth daily. 30 tablet 11  . diclofenac (VOLTAREN) 75 MG EC tablet Take 1 tablet (75 mg total) by mouth 2 (two) times daily. Prn pain 60 tablet 3  . doxycycline (VIBRAMYCIN) 100 MG capsule Take 1 capsule (100 mg total) by mouth 2 (two) times daily for 5 days. 10 capsule 0  . DULoxetine (CYMBALTA) 60 MG capsule Take 1 capsule (60 mg total) by mouth daily. 30 capsule 3  . [START ON 05/27/2018] fluconazole (DIFLUCAN) 150 MG tablet Take 1 tablet (150 mg total) by mouth once a week for 4 doses. 4 tablet 0  . fluticasone (FLONASE) 50 MCG/ACT nasal spray Place 1 spray into both nostrils daily. 16 g 0  . fluticasone furoate-vilanterol (BREO ELLIPTA) 100-25 MCG/INH AEPB Inhale 1 puff into the lungs daily. 90 each 3  . gabapentin (NEURONTIN) 300 MG capsule Take 2 capsules (600 mg total) by mouth 3 (three) times daily. 180 capsule 3  . hydrOXYzine (ATARAX/VISTARIL) 25 MG tablet Take 1 tablet (25 mg total) by mouth every 8 (eight) hours as needed. 90 tablet 2  . ipratropium (ATROVENT) 0.06 % nasal spray Place 2 sprays into both nostrils 4 (four) times daily. 15 mL 1  . lactulose (CHRONULAC) 10 GM/15ML solution Take 15 mLs (10 g total) by mouth 2 (two) times daily as needed for mild constipation. 946 mL 0  . lisinopril-hydrochlorothiazide (ZESTORETIC) 20-12.5 MG tablet Take 2 tablets by mouth daily. 60 tablet 6  . montelukast (SINGULAIR) 10 MG tablet Take 10 mg by mouth daily.     . Multiple Vitamin (MULTIVITAMIN WITH MINERALS) TABS Take 1 tablet by mouth daily.    . polyethylene glycol powder (GLYCOLAX/MIRALAX) powder Take 17 g  by mouth daily. 3350 g 1  . tiZANidine (ZANAFLEX) 4 MG tablet Take 1 tablet (4 mg total) by mouth every 8 (eight) hours as needed for muscle spasms. 90 tablet 3  . traMADol (ULTRAM) 50 MG tablet Take 1 tablet (50 mg total) by mouth at bedtime. 30 tablet 1  . triamcinolone cream (KENALOG) 0.1 % APPLY 1 APPLICATION TOPICALLY 2 TIMES DAILY. 45 g 1  . VENTOLIN HFA 108 (90 Base) MCG/ACT inhaler Inhale 2 puffs into the lungs every 6 (six) hours as needed. 54 g 3  . mirtazapine (REMERON) 30 MG tablet Take 1 tablet (30 mg total) by mouth at bedtime. 30 tablet 3   No facility-administered medications prior to visit.     No Known Allergies     Objective:    BP (!) 145/88 (BP Location: Left Arm, Patient Position: Sitting, Cuff Size: Normal)   Pulse 99   Temp 98.7 F (37.1 C) (Oral)   Resp 14   Ht 5\' 1"  (1.549 m)   Wt 118 lb  6.4 oz (53.7 kg)   LMP 03/17/2012   SpO2 97%   BMI 22.37 kg/m  Wt Readings from Last 3 Encounters:  05/24/18 118 lb 6.4 oz (53.7 kg)  05/20/18 126 lb 1.7 oz (57.2 kg)  03/31/18 122 lb (55.3 kg)    Physical Exam  Constitutional: She is oriented to person, place, and time. She appears well-developed and well-nourished. She is cooperative.  HENT:  Head: Normocephalic and atraumatic.  Cardiovascular: Normal rate, regular rhythm, normal heart sounds and intact distal pulses. Exam reveals no gallop and no friction rub.  No murmur heard. Pulmonary/Chest: Effort normal and breath sounds normal. No tachypnea. No respiratory distress. She has no decreased breath sounds. She has no wheezes. She has no rhonchi. She has no rales. She exhibits no tenderness.  Abdominal: Bowel sounds are normal.  Musculoskeletal: Normal range of motion. She exhibits no edema.       Right foot: There is no bony tenderness and no swelling.       Feet:  Neurological: She is alert and oriented to person, place, and time. Coordination normal.  Skin: Skin is warm and dry.  Psychiatric: She has a  normal mood and affect. Her behavior is normal. Judgment and thought content normal.  Nursing note and vitals reviewed.     Patient has been counseled extensively about nutrition and exercise as well as the importance of adherence with medications and regular follow-up. The patient was given clear instructions to go to ER or return to medical center if symptoms don't improve, worsen or new problems develop. The patient verbalized understanding.   Follow-up: Return if symptoms worsen or fail to improve.   Claiborne Rigg, FNP-BC [facility] [place], [place] [phone]   05/24/2018, 9:09 PM

## 2018-06-21 ENCOUNTER — Emergency Department (HOSPITAL_COMMUNITY): Payer: No Typology Code available for payment source

## 2018-06-21 ENCOUNTER — Encounter (HOSPITAL_COMMUNITY): Payer: Self-pay

## 2018-06-21 ENCOUNTER — Emergency Department (HOSPITAL_COMMUNITY)
Admission: EM | Admit: 2018-06-21 | Discharge: 2018-06-22 | Disposition: A | Discharge status: Home / Self Care | Payer: No Typology Code available for payment source | Attending: Emergency Medicine | Admitting: Emergency Medicine

## 2018-06-21 ENCOUNTER — Other Ambulatory Visit: Payer: Self-pay

## 2018-06-21 DIAGNOSIS — I10 Essential (primary) hypertension: Secondary | ICD-10-CM | POA: Insufficient documentation

## 2018-06-21 DIAGNOSIS — F17210 Nicotine dependence, cigarettes, uncomplicated: Secondary | ICD-10-CM | POA: Insufficient documentation

## 2018-06-21 DIAGNOSIS — J449 Chronic obstructive pulmonary disease, unspecified: Secondary | ICD-10-CM | POA: Insufficient documentation

## 2018-06-21 DIAGNOSIS — R05 Cough: Secondary | ICD-10-CM

## 2018-06-21 DIAGNOSIS — R634 Abnormal weight loss: Secondary | ICD-10-CM

## 2018-06-21 DIAGNOSIS — N390 Urinary tract infection, site not specified: Secondary | ICD-10-CM

## 2018-06-21 DIAGNOSIS — R059 Cough, unspecified: Secondary | ICD-10-CM

## 2018-06-21 LAB — COMPREHENSIVE METABOLIC PANEL
ALBUMIN: 4.3 g/dL (ref 3.5–5.0)
ALK PHOS: 60 U/L (ref 38–126)
ALT: 21 U/L (ref 0–44)
ANION GAP: 13 (ref 5–15)
AST: 24 U/L (ref 15–41)
BUN: 7 mg/dL — ABNORMAL LOW (ref 8–23)
CALCIUM: 9.7 mg/dL (ref 8.9–10.3)
CHLORIDE: 109 mmol/L (ref 98–111)
CO2: 23 mmol/L (ref 22–32)
Creatinine, Ser: 0.91 mg/dL (ref 0.44–1.00)
GFR calc Af Amer: >60 mL/min (ref >60)
GFR calc non Af Amer: >60 mL/min (ref >60)
Glucose, Bld: 82 mg/dL (ref 70–99)
Potassium: 3.4 mmol/L — ABNORMAL LOW (ref 3.5–5.1)
SODIUM: 145 mmol/L (ref 135–145)
Total Bilirubin: 0.5 mg/dL (ref 0.3–1.2)
Total Protein: 7.8 g/dL (ref 6.5–8.1)

## 2018-06-21 LAB — CBC
HEMATOCRIT: 35.6 % — AB (ref 36.0–46.0)
HEMOGLOBIN: 12.4 g/dL (ref 12.0–15.0)
MCH: 29.7 pg (ref 26.0–34.0)
MCHC: 34.8 g/dL (ref 30.0–36.0)
MCV: 85.4 fL (ref 78.0–100.0)
Platelets: 249 10*3/uL (ref 150–400)
RBC: 4.17 MIL/uL (ref 3.87–5.11)
RDW: 15.7 % — ABNORMAL HIGH (ref 11.5–15.5)
WBC: 7.2 10*3/uL (ref 4.0–10.5)

## 2018-06-21 LAB — URINALYSIS, ROUTINE W REFLEX MICROSCOPIC
Bacteria, UA: NONE SEEN
Bilirubin Urine: NEGATIVE
Glucose, UA: NEGATIVE mg/dL
Hgb urine dipstick: NEGATIVE
Ketones, ur: NEGATIVE mg/dL
Nitrite: NEGATIVE
Protein, ur: NEGATIVE mg/dL
SPECIFIC GRAVITY, URINE: 1.014 (ref 1.005–1.030)
WBC, UA: >50 WBC/hpf — ABNORMAL HIGH (ref 0–5)
pH: 6 (ref 5.0–8.0)

## 2018-06-21 LAB — TROPONIN I

## 2018-06-21 LAB — TSH: TSH: 1.706 u[IU]/mL (ref 0.350–4.500)

## 2018-06-21 LAB — CK: CK TOTAL: 77 U/L (ref 38–234)

## 2018-06-21 MED ORDER — CEPHALEXIN 500 MG PO CAPS: 500.0000 mg | ORAL_CAPSULE | Freq: Two times a day (BID) | ORAL | 0 refills | Status: AC

## 2018-06-21 MED FILL — hydrOXYzine HCL 25 MG TABS: 25 | 20 days supply | Qty: 60 | Fill #1

## 2018-06-21 MED FILL — LISINOPRIL-HCTZ 20-12.5 MG: 20-12.5 | 30 days supply | Qty: 60 | Fill #1

## 2018-06-21 MED FILL — traMADol HCL 50 MG TABS: 50 | 30 days supply | Qty: 30 | Fill #1

## 2018-06-21 MED FILL — ?MIRTAZAPINE 30 MG TABLET: 30 | 30 days supply | Qty: 30 | Fill #0

## 2018-06-21 NOTE — ED Notes (Signed)
Pt ambulatory to bathroom

## 2018-06-21 NOTE — ED Provider Notes (Signed)
[facility] Emergency Department Provider Note MRN:  [id number]  Arrival date & time: 06/22/18     Chief Complaint   Fatigue; Weight Loss; and Generalized Body Aches   History of Present Illness   Tammy Schwartz is a 62 y.o. year-old female with a history of COPD presenting to the ED with chief complaint of fatigue.  Patient explains that for the past 1 to 2 weeks, she is been experiencing generalized malaise, fatigue.  Unintentional weight loss, 10 pounds.  No appetite.  Denies headache or vision change, no chest pain or shortness of breath.  No nausea vomiting or diarrhea.  No abdominal pain.  Denies recent fever.  No dysuria.  No rashes.  Explains that she had a recent hospital admission for cellulitis of the right foot, which is healing well.  Review of Systems  A complete 10 system review of systems was obtained and all systems are negative except as noted in the HPI and PMH.   Patient's Health History    Past Medical History:  Diagnosis Date  . Chronic back pain    HNp,spondylosis,radiculopathy  . COPD (chronic obstructive pulmonary disease) (HCC)   . Hyperlipidemia    was on choloesterol meds last yr-samples given in office but not needed since  . Hypertension    takes Exforge daily  . Joint pain   . Pneumonia    hx of;many yrs ago  . Shortness of breath    takes Singulair daily;with exertion  . Sickle cell trait (HCC)   . Spondylolysis   . Tobacco use     Past Surgical History:  Procedure Laterality Date  . BACK SURGERY    . BREAST EXCISIONAL BIOPSY Left 2007   benign  . BREAST SURGERY Left    lumpectomy  . COLONOSCOPY    . DILATION AND CURETTAGE OF UTERUS    . LUMBAR LAMINECTOMY/DECOMPRESSION MICRODISCECTOMY  06/04/2012   Procedure: LUMBAR LAMINECTOMY/DECOMPRESSION MICRODISCECTOMY 1 LEVEL;  Surgeon: Carmela Hurt, MD;  Location: [facility];  Service: Neurosurgery;  Laterality: Left;  LEFT Lumbar five-sacral one diskectomy  . LUMBAR  LAMINECTOMY/DECOMPRESSION MICRODISCECTOMY Right 06/16/2013   Procedure: RIGHT Lumbar Five-Sacral One Microdiskectomy;  Surgeon: Carmela Hurt, MD;  Location: [facility];  Service: Neurosurgery;  Laterality: Right;  RIGHT Lumbar Five-Sacral One Microdiskectomy  . LUMBAR LAMINECTOMY/DECOMPRESSION MICRODISCECTOMY Right 01/20/2014   Procedure: LUMBAR FOUR TO LUMBAR FIVE LUMBAR LAMINECTOMY/DECOMPRESSION MICRODISCECTOMY 1 LEVEL;  Surgeon: Carmela Hurt, MD;  Location: [facility];  Service: Neurosurgery;  Laterality: Right;  Right L45 laminectomy and foramintomy    Family History  Problem Relation Age of Onset  . Cancer - Other Mother   . CAD Father   . Hypertension Sister     Social History   Socioeconomic History  . Marital status: Married    Spouse name: Not on file  . Number of children: Not on file  . Years of education: Not on file  . Highest education level: Not on file  Occupational History  . Not on file  Social Needs  . Financial resource strain: Not on file  . Food insecurity:    Worry: Not on file    Inability: Not on file  . Transportation needs:    Medical: Not on file    Non-medical: Not on file  Tobacco Use  . Smoking status: Current Every Day Smoker    Packs/day: 0.25    Years: 20.00    Pack years: 5.00  Types: Cigarettes  . Smokeless tobacco: Never Used  Substance and Sexual Activity  . Alcohol use: No  . Drug use: No  . Sexual activity: Yes    Birth control/protection: Post-menopausal  Lifestyle  . Physical activity:    Days per week: 0 days    Minutes per session: 0 min  . Stress: Only a little  Relationships  . Social connections:    Talks on phone: More than three times a week    Gets together: Once a week    Attends religious service: More than 4 times per year    Active member of club or organization: No    Attends meetings of clubs or organizations: Never    Relationship status: Married  . Intimate partner violence:    Fear of current or  ex partner: No    Emotionally abused: No    Physically abused: No    Forced sexual activity: No  Other Topics Concern  . Not on file  Social History Narrative  . Not on file     Physical Exam  Vital Signs and Nursing Notes reviewed Vitals:   06/21/18 2301 06/21/18 2330  BP: 127/82 111/78  Pulse: 65 83  Resp: (!) 24 20  Temp:    SpO2: 92% 100%    CONSTITUTIONAL: Well-appearing, NAD NEURO:  Alert and oriented x 3, no focal deficits EYES:  eyes equal and reactive ENT/NECK:  no LAD, no JVD CARDIO: Regular rate, well-perfused, normal S1 and S2 PULM:  CTAB no wheezing or rhonchi GI/GU:  normal bowel sounds, non-distended, non-tender MSK/SPINE:  No gross deformities, no edema SKIN:  no rash, atraumatic PSYCH:  Appropriate speech and behavior  Diagnostic and Interventional Summary    EKG Interpretation  Date/Time:  Monday June 21 2018 20:14:18 EDT Ventricular Rate:  78 PR Interval:    QRS Duration: 79 QT Interval:  400 QTC Calculation: 456 R Axis:   12 Text Interpretation:  Sinus rhythm Confirmed by Kennis Carina [phone]) on 06/21/2018 11:41:35 PM      Labs Reviewed  CBC - Abnormal; Notable for the following components:      Result Value   HCT 35.6 (*)    RDW 15.7 (*)    All other components within normal limits  COMPREHENSIVE METABOLIC PANEL - Abnormal; Notable for the following components:   Potassium 3.4 (*)    BUN 7 (*)    All other components within normal limits  URINALYSIS, ROUTINE W REFLEX MICROSCOPIC - Abnormal; Notable for the following components:   APPearance HAZY (*)    Leukocytes, UA LARGE (*)    WBC, UA >50 (*)    Non Squamous Epithelial 6-10 (*)    All other components within normal limits  TSH  CK  TROPONIN I    DG Chest 2 View  Final Result      Medications - No data to display   Procedures Critical Care  ED Course and Medical Decision Making  I have reviewed the triage vital signs and the nursing notes.  Pertinent labs &  imaging results that were available during my care of the patient were reviewed by me and considered in my medical decision making (see below for details).    Nonspecific symptoms today in this 62 year old female, unclear etiology, will obtain screening labs, x-ray.  Considering metabolic disarray, underlying malignancy.  Work-up largely unremarkable with the exception of a urinalysis that suggests possible infection.  Will treat for UTI with Keflex.  Explained to patient that  this UTI may not explain all of her symptoms and she should follow-up closely with her PCP to discuss further management going forward.  After the discussed management above, the patient was determined to be safe for discharge.  The patient was in agreement with this plan and all questions regarding their care were answered.  ED return precautions were discussed and the patient will return to the ED with any significant worsening of condition.  Elmer Sow. Pilar Plate, MD [facility] [email or web address]  Final Clinical Impressions(s) / ED Diagnoses     ICD-10-CM   1. Lower urinary tract infectious disease N39.0   2. Cough R05 DG Chest 2 View    DG Chest 2 View  3. Weight loss R63.4     ED Discharge Orders         Ordered    cephALEXin (KEFLEX) 500 MG capsule  2 times daily     06/21/18 2345             Sabas Sous, MD 06/22/18 0005

## 2018-06-21 NOTE — ED Notes (Signed)
Patient transported to X-ray 

## 2018-06-21 NOTE — ED Notes (Signed)
Unsuccessful IV attempt x1. 

## 2018-06-21 NOTE — ED Notes (Addendum)
Unsuccessful IV attempt x1. Patient refusing additional attempts at this time. Requesting IV team.

## 2018-06-21 NOTE — Discharge Instructions (Addendum)
You were evaluated in the Emergency Department and after careful evaluation, we did not find any emergent condition requiring admission or further testing in the hospital. ° °Your symptoms today seem to be due to a urinary tract infection. ° °Please return to the Emergency Department if you experience any worsening of your condition.  We encourage you to follow up with a primary care provider.  Thank you for allowing us to be a part of your care. °

## 2018-06-21 NOTE — ED Triage Notes (Addendum)
Patient c/o feeling fatigued, generalized body aches,and having nausea x 2 weeks. Patient states she has lost about 10 lbs in the past 2 weeks. Patient denies any vomiting.

## 2018-06-23 MED FILL — CEPHALEXIN 500 MG CAPSULE: 500 | 7 days supply | Qty: 14 | Fill #0

## 2018-06-24 MED FILL — GABAPENTIN 300 MG CAPSULE: 300 | 30 days supply | Qty: 180 | Fill #1

## 2018-06-25 ENCOUNTER — Other Ambulatory Visit: Payer: Self-pay

## 2018-06-25 ENCOUNTER — Telehealth: Payer: Self-pay | Admitting: Family Medicine

## 2018-06-25 MED ORDER — BENZONATATE 100 MG PO CAPS: ORAL_CAPSULE | ORAL | 0 refills | Status: DC

## 2018-06-25 NOTE — Telephone Encounter (Signed)
Patient came in today wanting to get a refill for Benzonatate 100 MG. Please follow up with patient at [phone].

## 2018-06-26 ENCOUNTER — Encounter (HOSPITAL_COMMUNITY): Payer: Self-pay

## 2018-06-26 ENCOUNTER — Ambulatory Visit (HOSPITAL_COMMUNITY)
Admission: EM | Admit: 2018-06-26 | Discharge: 2018-06-26 | Disposition: A | Discharge status: Home / Self Care | Payer: No Typology Code available for payment source | Attending: Internal Medicine | Admitting: Internal Medicine

## 2018-06-26 DIAGNOSIS — J069 Acute upper respiratory infection, unspecified: Secondary | ICD-10-CM

## 2018-06-26 DIAGNOSIS — R05 Cough: Secondary | ICD-10-CM

## 2018-06-26 DIAGNOSIS — B9789 Other viral agents as the cause of diseases classified elsewhere: Secondary | ICD-10-CM

## 2018-06-26 DIAGNOSIS — R059 Cough, unspecified: Secondary | ICD-10-CM

## 2018-06-26 MED ORDER — BENZONATATE 100 MG PO CAPS: 100.0000 mg | ORAL_CAPSULE | Freq: Three times a day (TID) | ORAL | 0 refills | Status: DC

## 2018-06-26 NOTE — ED Provider Notes (Signed)
[facility]   [id number] 06/26/18 Arrival Time: 1349  CC: Cough  SUBJECTIVE:  Tammy Schwartz is a 62 y.o. female hx significant for COPD, tobacco, HTN, HDL use who presents with cough for 2 days.  Denies positive sick exposure or precipitating event.  Describes cough as constant and dry.  Has tried OTC robitussin without relief.  Denies worsening symptoms.  Reports previous symptoms in the past.   Complains of associated rhinorrhea.  Denies fever, chills, fatigue, sinus pain, rhinorrhea, sore throat, SOB, wheezing, chest pain, nausea, changes in bowel or bladder habits.    Was admitted to the hospital for foot cellulitis beginning of August.    Scheduled appointment with PCP on 07/07/18.    ROS: As per HPI.  Past Medical History:  Diagnosis Date  . Chronic back pain    HNp,spondylosis,radiculopathy  . COPD (chronic obstructive pulmonary disease) (HCC)   . Hyperlipidemia    was on choloesterol meds last yr-samples given in office but not needed since  . Hypertension    takes Exforge daily  . Joint pain   . Pneumonia    hx of;many yrs ago  . Shortness of breath    takes Singulair daily;with exertion  . Sickle cell trait (HCC)   . Spondylolysis   . Tobacco use    Past Surgical History:  Procedure Laterality Date  . BACK SURGERY    . BREAST EXCISIONAL BIOPSY Left 2007   benign  . BREAST SURGERY Left    lumpectomy  . COLONOSCOPY    . DILATION AND CURETTAGE OF UTERUS    . LUMBAR LAMINECTOMY/DECOMPRESSION MICRODISCECTOMY  06/04/2012   Procedure: LUMBAR LAMINECTOMY/DECOMPRESSION MICRODISCECTOMY 1 LEVEL;  Surgeon: Carmela HurtKyle L Cabbell, MD;  Location: [facility];  Service: Neurosurgery;  Laterality: Left;  LEFT Lumbar five-sacral one diskectomy  . LUMBAR LAMINECTOMY/DECOMPRESSION MICRODISCECTOMY Right 06/16/2013   Procedure: RIGHT Lumbar Five-Sacral One Microdiskectomy;  Surgeon: Carmela HurtKyle L Cabbell, MD;  Location: [facility];  Service: Neurosurgery;  Laterality: Right;   RIGHT Lumbar Five-Sacral One Microdiskectomy  . LUMBAR LAMINECTOMY/DECOMPRESSION MICRODISCECTOMY Right 01/20/2014   Procedure: LUMBAR FOUR TO LUMBAR FIVE LUMBAR LAMINECTOMY/DECOMPRESSION MICRODISCECTOMY 1 LEVEL;  Surgeon: Carmela HurtKyle L Cabbell, MD;  Location: [facility];  Service: Neurosurgery;  Laterality: Right;  Right L45 laminectomy and foramintomy   No Known Allergies No current facility-administered medications on file prior to encounter.    Current Outpatient Medications on File Prior to Encounter  Medication Sig Dispense Refill  . acetaminophen (TYLENOL) 325 MG tablet Take 2 tablets (650 mg total) by mouth every 6 (six) hours as needed. (Patient taking differently: Take 650 mg by mouth every 6 (six) hours as needed for mild pain or headache. ) 30 tablet 0  . amLODIPine-Valsartan-HCTZ 5-160-12.5 MG TABS Take 1 tablet by mouth 2 (two) times daily.    . cephALEXin (KEFLEX) 500 MG capsule Take 1 capsule (500 mg total) by mouth 2 (two) times daily for 7 days. 14 capsule 0  . cetirizine (ZYRTEC) 10 MG tablet Take 1 tablet (10 mg total) by mouth daily. 30 tablet 11  . diclofenac (VOLTAREN) 75 MG EC tablet Take 1 tablet (75 mg total) by mouth 2 (two) times daily. Prn pain (Patient taking differently: Take 75 mg by mouth 2 (two) times daily as needed for mild pain. ) 60 tablet 3  . DULoxetine (CYMBALTA) 60 MG capsule Take 1 capsule (60 mg total) by mouth daily. 30 capsule 3  . fluticasone (FLONASE) 50 MCG/ACT nasal spray Place 1  spray into both nostrils daily. 16 g 0  . fluticasone furoate-vilanterol (BREO ELLIPTA) 100-25 MCG/INH AEPB Inhale 1 puff into the lungs daily. 90 each 3  . gabapentin (NEURONTIN) 300 MG capsule Take 2 capsules (600 mg total) by mouth 3 (three) times daily. 180 capsule 3  . hydrOXYzine (ATARAX/VISTARIL) 25 MG tablet Take 1 tablet (25 mg total) by mouth every 8 (eight) hours as needed. (Patient not taking: Reported on 06/21/2018) 90 tablet 2  . ipratropium (ATROVENT) 0.06 % nasal  spray Place 2 sprays into both nostrils 4 (four) times daily. 15 mL 1  . lactulose (CHRONULAC) 10 GM/15ML solution Take 15 mLs (10 g total) by mouth 2 (two) times daily as needed for mild constipation. 946 mL 0  . lisinopril-hydrochlorothiazide (ZESTORETIC) 20-12.5 MG tablet Take 2 tablets by mouth daily. 60 tablet 6  . mirtazapine (REMERON) 30 MG tablet Take 1 tablet (30 mg total) by mouth at bedtime. 30 tablet 3  . montelukast (SINGULAIR) 10 MG tablet Take 10 mg by mouth daily.     . Multiple Vitamin (MULTIVITAMIN WITH MINERALS) TABS Take 1 tablet by mouth daily.    . polyethylene glycol powder (GLYCOLAX/MIRALAX) powder Take 17 g by mouth daily. (Patient taking differently: Take 17 g by mouth daily as needed for mild constipation. ) 3350 g 1  . pregabalin (LYRICA) 150 MG capsule Take 150 mg by mouth at bedtime.    Marland Kitchen tiZANidine (ZANAFLEX) 4 MG tablet Take 1 tablet (4 mg total) by mouth every 8 (eight) hours as needed for muscle spasms. (Patient not taking: Reported on 06/21/2018) 90 tablet 3  . traMADol (ULTRAM) 50 MG tablet Take 1 tablet (50 mg total) by mouth at bedtime. 30 tablet 1  . triamcinolone cream (KENALOG) 0.1 % APPLY 1 APPLICATION TOPICALLY 2 TIMES DAILY. (Patient taking differently: Apply 1 application topically 2 (two) times daily. ) 45 g 1  . VENTOLIN HFA 108 (90 Base) MCG/ACT inhaler Inhale 2 puffs into the lungs every 6 (six) hours as needed. 54 g 3    Social History   Socioeconomic History  . Marital status: Married    Spouse name: Not on file  . Number of children: Not on file  . Years of education: Not on file  . Highest education level: Not on file  Occupational History  . Not on file  Social Needs  . Financial resource strain: Not on file  . Food insecurity:    Worry: Not on file    Inability: Not on file  . Transportation needs:    Medical: Not on file    Non-medical: Not on file  Tobacco Use  . Smoking status: Current Every Day Smoker    Packs/day: 0.25     Years: 20.00    Pack years: 5.00    Types: Cigarettes  . Smokeless tobacco: Never Used  Substance and Sexual Activity  . Alcohol use: No  . Drug use: No  . Sexual activity: Yes    Birth control/protection: Post-menopausal  Lifestyle  . Physical activity:    Days per week: 0 days    Minutes per session: 0 min  . Stress: Only a little  Relationships  . Social connections:    Talks on phone: More than three times a week    Gets together: Once a week    Attends religious service: More than 4 times per year    Active member of club or organization: No    Attends meetings of clubs or organizations:  Never    Relationship status: Married  . Intimate partner violence:    Fear of current or ex partner: No    Emotionally abused: No    Physically abused: No    Forced sexual activity: No  Other Topics Concern  . Not on file  Social History Narrative  . Not on file   Family History  Problem Relation Age of Onset  . Cancer - Other Mother   . CAD Father   . Hypertension Sister      OBJECTIVE:  Vitals:   06/26/18 1455 06/26/18 1458  BP: 117/87   Pulse: 91   Resp: 18   Temp: 97.8 F (36.6 C)   TempSrc: Tympanic   SpO2: 100%   Weight:  114 lb (51.7 kg)    General appearance: AOx3 in no acute distress; nontoxic appearance HEENT: PERRL.  EOM grossly intact.  Nose: mild clear rhinorrhea; Throat: tonsils nonerythematous, uvula midline Neck: supple without LAD Lungs: clear to auscultation bilaterally without adventitious breath sounds; mild dry cough Heart: regular rate and rhythm.  Radial pulses 2+ symmetrical bilaterally Skin: warm and dry Psychological: alert and cooperative; normal mood and affect  ASSESSMENT & PLAN:  1. Cough   2. Viral URI with cough     Meds ordered this encounter  Medications  . benzonatate (TESSALON) 100 MG capsule    Sig: Take 1 capsule (100 mg total) by mouth every 8 (eight) hours.    Dispense:  21 capsule    Refill:  0    Order Specific  Question:   Supervising Provider    Answer:   Isa Rankin [[id number]]    Get plenty of rest and push fluids Use OTC medication as needed for symptomatic relief Prescribed tessolone perles as needed for cough Follow up with PCP if symptoms persist Return or go to ER if you have any new or worsening symptoms   Reviewed expectations re: course of current medical issues. Questions answered. Outlined signs and symptoms indicating need for more acute intervention. Patient verbalized understanding. After Visit Summary given.          Rennis Harding, PA-C 06/26/18 1544

## 2018-06-26 NOTE — Discharge Instructions (Signed)
Get plenty of rest and push fluids °Use OTC medication as needed for symptomatic relief °Prescribed tessolone perles as needed for cough °Follow up with PCP if symptoms persist °Return or go to ER if you have any new or worsening symptoms  °

## 2018-06-26 NOTE — ED Triage Notes (Signed)
Pt states she has a bad cough 2 days.

## 2018-06-28 MED FILL — BENZONATATE 100 MG CAP: 100 | 10 days supply | Qty: 20 | Fill #0

## 2018-07-06 ENCOUNTER — Emergency Department (HOSPITAL_COMMUNITY): Payer: No Typology Code available for payment source

## 2018-07-06 ENCOUNTER — Observation Stay (HOSPITAL_COMMUNITY)
Admission: EM | Admit: 2018-07-06 | Discharge: 2018-07-08 | Disposition: A | Discharge status: Home / Self Care | Payer: No Typology Code available for payment source | Attending: Internal Medicine | Admitting: Internal Medicine

## 2018-07-06 ENCOUNTER — Other Ambulatory Visit: Payer: Self-pay

## 2018-07-06 ENCOUNTER — Encounter (HOSPITAL_COMMUNITY): Payer: Self-pay

## 2018-07-06 DIAGNOSIS — E785 Hyperlipidemia, unspecified: Secondary | ICD-10-CM | POA: Diagnosis present

## 2018-07-06 DIAGNOSIS — Z8249 Family history of ischemic heart disease and other diseases of the circulatory system: Secondary | ICD-10-CM | POA: Insufficient documentation

## 2018-07-06 DIAGNOSIS — Z720 Tobacco use: Secondary | ICD-10-CM | POA: Diagnosis present

## 2018-07-06 DIAGNOSIS — J431 Panlobular emphysema: Secondary | ICD-10-CM

## 2018-07-06 DIAGNOSIS — K5900 Constipation, unspecified: Secondary | ICD-10-CM

## 2018-07-06 DIAGNOSIS — D571 Sickle-cell disease without crisis: Secondary | ICD-10-CM | POA: Insufficient documentation

## 2018-07-06 DIAGNOSIS — E876 Hypokalemia: Secondary | ICD-10-CM | POA: Insufficient documentation

## 2018-07-06 DIAGNOSIS — F17210 Nicotine dependence, cigarettes, uncomplicated: Secondary | ICD-10-CM | POA: Insufficient documentation

## 2018-07-06 DIAGNOSIS — I208 Other forms of angina pectoris: Secondary | ICD-10-CM

## 2018-07-06 DIAGNOSIS — I2584 Coronary atherosclerosis due to calcified coronary lesion: Secondary | ICD-10-CM

## 2018-07-06 DIAGNOSIS — J449 Chronic obstructive pulmonary disease, unspecified: Secondary | ICD-10-CM | POA: Diagnosis present

## 2018-07-06 DIAGNOSIS — Z9889 Other specified postprocedural states: Secondary | ICD-10-CM | POA: Insufficient documentation

## 2018-07-06 DIAGNOSIS — Z7982 Long term (current) use of aspirin: Secondary | ICD-10-CM | POA: Insufficient documentation

## 2018-07-06 DIAGNOSIS — I2510 Atherosclerotic heart disease of native coronary artery without angina pectoris: Secondary | ICD-10-CM

## 2018-07-06 DIAGNOSIS — R079 Chest pain, unspecified: Secondary | ICD-10-CM | POA: Diagnosis present

## 2018-07-06 DIAGNOSIS — R000 Tachycardia, unspecified: Secondary | ICD-10-CM | POA: Insufficient documentation

## 2018-07-06 DIAGNOSIS — R0789 Other chest pain: Principal | ICD-10-CM | POA: Insufficient documentation

## 2018-07-06 DIAGNOSIS — Z23 Encounter for immunization: Secondary | ICD-10-CM | POA: Insufficient documentation

## 2018-07-06 DIAGNOSIS — I10 Essential (primary) hypertension: Secondary | ICD-10-CM | POA: Diagnosis present

## 2018-07-06 DIAGNOSIS — E870 Hyperosmolality and hypernatremia: Secondary | ICD-10-CM | POA: Insufficient documentation

## 2018-07-06 DIAGNOSIS — E782 Mixed hyperlipidemia: Secondary | ICD-10-CM

## 2018-07-06 DIAGNOSIS — R0902 Hypoxemia: Secondary | ICD-10-CM

## 2018-07-06 DIAGNOSIS — Z79899 Other long term (current) drug therapy: Secondary | ICD-10-CM | POA: Insufficient documentation

## 2018-07-06 DIAGNOSIS — Z7951 Long term (current) use of inhaled steroids: Secondary | ICD-10-CM | POA: Insufficient documentation

## 2018-07-06 LAB — TROPONIN I
Troponin I: <0.03 ng/mL (ref <0.03)
Troponin I: <0.03 ng/mL (ref <0.03)

## 2018-07-06 LAB — BASIC METABOLIC PANEL
Anion gap: 9 (ref 5–15)
BUN: 6 mg/dL — AB (ref 8–23)
CO2: 29 mmol/L (ref 22–32)
Calcium: 9.7 mg/dL (ref 8.9–10.3)
Chloride: 109 mmol/L (ref 98–111)
Creatinine, Ser: 1 mg/dL (ref 0.44–1.00)
GFR calc Af Amer: >60 mL/min (ref >60)
GFR, EST NON AFRICAN AMERICAN: 59 mL/min — AB (ref >60)
GLUCOSE: 97 mg/dL (ref 70–99)
POTASSIUM: 3.2 mmol/L — AB (ref 3.5–5.1)
SODIUM: 147 mmol/L — AB (ref 135–145)

## 2018-07-06 LAB — CBC
HEMATOCRIT: 36.9 % (ref 36.0–46.0)
Hemoglobin: 12.3 g/dL (ref 12.0–15.0)
MCH: 29.4 pg (ref 26.0–34.0)
MCHC: 33.3 g/dL (ref 30.0–36.0)
MCV: 88.1 fL (ref 78.0–100.0)
Platelets: 301 10*3/uL (ref 150–400)
RBC: 4.19 MIL/uL (ref 3.87–5.11)
RDW: 15.9 % — ABNORMAL HIGH (ref 11.5–15.5)
WBC: 7.4 10*3/uL (ref 4.0–10.5)

## 2018-07-06 MED ORDER — NITROGLYCERIN 0.4 MG SL SUBL: 0.4000 mg | SUBLINGUAL_TABLET | SUBLINGUAL | Status: DC | PRN

## 2018-07-06 MED ORDER — ASPIRIN 81 MG PO CHEW
324.0000 mg | CHEWABLE_TABLET | Freq: Once | ORAL | Status: AC
  Administered 2018-07-06: 324 mg via ORAL
  Filled 2018-07-06: qty 4

## 2018-07-06 MED ORDER — GI COCKTAIL ~~LOC~~
30.0000 mL | Freq: Once | ORAL | Status: AC
  Administered 2018-07-06: 30 mL via ORAL
  Filled 2018-07-06: qty 30

## 2018-07-06 MED ORDER — IPRATROPIUM-ALBUTEROL 0.5-2.5 (3) MG/3ML IN SOLN
3.0000 mL | Freq: Once | RESPIRATORY_TRACT | Status: AC
  Administered 2018-07-06: 3 mL via RESPIRATORY_TRACT
  Filled 2018-07-06: qty 3

## 2018-07-06 MED ORDER — IOPAMIDOL (ISOVUE-370) INJECTION 76%
INTRAVENOUS | Status: AC
  Filled 2018-07-06: qty 100

## 2018-07-06 MED ORDER — FAMOTIDINE IN NACL 20-0.9 MG/50ML-% IV SOLN
20.0000 mg | Freq: Once | INTRAVENOUS | Status: AC
  Administered 2018-07-06: 20 mg via INTRAVENOUS
  Filled 2018-07-06: qty 50

## 2018-07-06 MED ORDER — IOPAMIDOL (ISOVUE-370) INJECTION 76%
100.0000 mL | Freq: Once | INTRAVENOUS | Status: AC | PRN
  Administered 2018-07-06: 100 mL via INTRAVENOUS

## 2018-07-06 NOTE — H&P (Signed)
History and Physical   Tammy Schwartz WUJ:[id number] DOB: 08/02/1956 DOA: 07/06/2018  Referring MD/NP/PA: Dr. Lockie Molauratolo  PCP: Hoy RegisterNewlin, Enobong, MD   Outpatient Specialists: None   Patient coming from: Home  Chief Complaint: Chest pain  HPI: Tammy MacadamVerna S Menton is a 62 y.o. female with medical history significant of , hypertension, tobacco abuse, COPD and GERD who presented to the ER with substernal chest pain that has been going on for the last 2 days.  Patient had no prior history of coronary artery disease.  Has had some.  Nausea but no vomiting.  Patient has strong family history of coronary artery disease including her father that died in young age from heart disease she has had a reflux type symptoms in the past but the pain this time around is different.  Denies any fever or cough and no significant shortness of breath.  Pain is more pressure-like in the middle of the chest.  Radiated to the left but not to the arm.  Due to risk factors patient is being admitted for MI rule out.  ED Course: Temperature 98.8 blood pressure 149/81, pulse 118.  White count is 7.0, hemoglobin 10.5 with platelet 273.  Sodium 147, potassium 3.2 with creatinine 1.0 and BUN of 6.  Initial CK and troponin are negative.  Glucose 97.  EKG showed no active findings.  Chest x-ray is also negative.  CT angiogram of the chest was negative for PE.  Review of Systems: As per HPI otherwise 10 point review of systems negative.    Past Medical History:  Diagnosis Date  . Chronic back pain    HNp,spondylosis,radiculopathy  . COPD (chronic obstructive pulmonary disease) (HCC)   . Hyperlipidemia    was on choloesterol meds last yr-samples given in office but not needed since  . Hypertension    takes Exforge daily  . Joint pain   . Pneumonia    hx of;many yrs ago  . Shortness of breath    takes Singulair daily;with exertion  . Sickle cell trait (HCC)   . Spondylolysis   . Tobacco use     Past Surgical History:   Procedure Laterality Date  . BACK SURGERY    . BREAST EXCISIONAL BIOPSY Left 2007   benign  . BREAST SURGERY Left    lumpectomy  . COLONOSCOPY    . DILATION AND CURETTAGE OF UTERUS    . LUMBAR LAMINECTOMY/DECOMPRESSION MICRODISCECTOMY  06/04/2012   Procedure: LUMBAR LAMINECTOMY/DECOMPRESSION MICRODISCECTOMY 1 LEVEL;  Surgeon: Carmela HurtKyle L Cabbell, MD;  Location: [facility];  Service: Neurosurgery;  Laterality: Left;  LEFT Lumbar five-sacral one diskectomy  . LUMBAR LAMINECTOMY/DECOMPRESSION MICRODISCECTOMY Right 06/16/2013   Procedure: RIGHT Lumbar Five-Sacral One Microdiskectomy;  Surgeon: Carmela HurtKyle L Cabbell, MD;  Location: [facility];  Service: Neurosurgery;  Laterality: Right;  RIGHT Lumbar Five-Sacral One Microdiskectomy  . LUMBAR LAMINECTOMY/DECOMPRESSION MICRODISCECTOMY Right 01/20/2014   Procedure: LUMBAR FOUR TO LUMBAR FIVE LUMBAR LAMINECTOMY/DECOMPRESSION MICRODISCECTOMY 1 LEVEL;  Surgeon: Carmela HurtKyle L Cabbell, MD;  Location: [facility];  Service: Neurosurgery;  Laterality: Right;  Right L45 laminectomy and foramintomy     reports that she has been smoking cigarettes. She has a 5.00 pack-year smoking history. She has never used smokeless tobacco. She reports that she does not drink alcohol or use drugs.  No Known Allergies  Family History  Problem Relation Age of Onset  . Cancer - Other Mother   . CAD Father   . Hypertension Sister      Prior  to Admission medications   Medication Sig Start Date End Date Taking? Authorizing Provider  amLODIPine-Valsartan-HCTZ 5-160-12.5 MG TABS Take 1 tablet by mouth 2 (two) times daily.   Yes [provider]  aspirin EC 81 MG tablet Take 162 mg by mouth daily.   Yes [provider]  cetirizine (ZYRTEC) 10 MG tablet Take 1 tablet (10 mg total) by mouth daily. 09/28/17  Yes Anders Simmonds, PA-C  diclofenac (VOLTAREN) 75 MG EC tablet Take 1 tablet (75 mg total) by mouth 2 (two) times daily. Prn pain Patient taking differently: Take 75  mg by mouth 2 (two) times daily as needed for mild pain.  03/31/18  Yes Hoy Register, MD  DULoxetine (CYMBALTA) 60 MG capsule Take 1 capsule (60 mg total) by mouth daily. 03/31/18  Yes Newlin, Odette Horns, MD  fluticasone (FLONASE) 50 MCG/ACT nasal spray Place 1 spray into both nostrils daily. 03/31/18  Yes Newlin, Enobong, MD  fluticasone furoate-vilanterol (BREO ELLIPTA) 100-25 MCG/INH AEPB Inhale 1 puff into the lungs daily. 03/31/18  Yes Hoy Register, MD  gabapentin (NEURONTIN) 300 MG capsule Take 2 capsules (600 mg total) by mouth 3 (three) times daily. 03/31/18  Yes Hoy Register, MD  hydrOXYzine (ATARAX/VISTARIL) 25 MG tablet Take 1 tablet (25 mg total) by mouth every 8 (eight) hours as needed. 03/31/18  Yes Hoy Register, MD  lisinopril-hydrochlorothiazide (ZESTORETIC) 20-12.5 MG tablet Take 2 tablets by mouth daily. 03/31/18  Yes Hoy Register, MD  mirtazapine (REMERON) 30 MG tablet Take 1 tablet (30 mg total) by mouth at bedtime. 05/24/18  Yes Claiborne Rigg, NP  Multiple Vitamin (MULTIVITAMIN WITH MINERALS) TABS Take 1 tablet by mouth daily.   Yes [provider]  pregabalin (LYRICA) 150 MG capsule Take 150 mg by mouth at bedtime.   Yes [provider]  tiZANidine (ZANAFLEX) 4 MG tablet Take 1 tablet (4 mg total) by mouth every 8 (eight) hours as needed for muscle spasms. 03/31/18  Yes Hoy Register, MD  traMADol (ULTRAM) 50 MG tablet Take 1 tablet (50 mg total) by mouth at bedtime. 03/31/18  Yes Newlin, Odette Horns, MD  VENTOLIN HFA 108 (90 Base) MCG/ACT inhaler Inhale 2 puffs into the lungs every 6 (six) hours as needed. 06/08/17  Yes Hoy Register, MD  acetaminophen (TYLENOL) 325 MG tablet Take 2 tablets (650 mg total) by mouth every 6 (six) hours as needed. Patient not taking: Reported on 07/06/2018 10/08/17   Linus Mako B, NP  benzonatate (TESSALON) 100 MG capsule Take 1 capsule (100 mg total) by mouth every 8 (eight) hours. Patient not taking: Reported on  07/06/2018 06/26/18   Alvino Chapel, [place], PA-C  ipratropium (ATROVENT) 0.06 % nasal spray Place 2 sprays into both nostrils 4 (four) times daily. Patient not taking: Reported on 07/06/2018 11/11/16   Linna Hoff, MD  lactulose (CHRONULAC) 10 GM/15ML solution Take 15 mLs (10 g total) by mouth 2 (two) times daily as needed for mild constipation. 06/23/17   Hoy Register, MD  montelukast (SINGULAIR) 10 MG tablet Take 10 mg by mouth daily.     [provider]  polyethylene glycol powder (GLYCOLAX/MIRALAX) powder Take 17 g by mouth daily. Patient taking differently: Take 17 g by mouth daily as needed for mild constipation.  06/09/17   Hoy Register, MD  triamcinolone cream (KENALOG) 0.1 % APPLY 1 APPLICATION TOPICALLY 2 TIMES DAILY. Patient not taking: No sig reported 04/01/18   Hoy Register, MD    Physical Exam: Vitals:   07/06/18 1753 07/06/18 2000  07/06/18 2130 07/06/18 2200  BP: (!) 149/81 136/64 (!) 149/78 131/88  Pulse: (!) 113 96 97 87  Resp: 20 (!) 22 18 14   Temp: 98.8 F (37.1 C)     TempSrc: Oral     SpO2: 97% 100% 100% 100%  Weight: 54.4 kg     Height: 5\' 1"  (1.549 m)         Constitutional: NAD, calm, comfortable Vitals:   07/06/18 1753 07/06/18 2000 07/06/18 2130 07/06/18 2200  BP: (!) 149/81 136/64 (!) 149/78 131/88  Pulse: (!) 113 96 97 87  Resp: 20 (!) 22 18 14   Temp: 98.8 F (37.1 C)     TempSrc: Oral     SpO2: 97% 100% 100% 100%  Weight: 54.4 kg     Height: 5\' 1"  (1.549 m)      Eyes: PERRL, lids and conjunctivae normal ENMT: Mucous membranes are moist. Posterior pharynx clear of any exudate or lesions.Normal dentition.  Neck: normal, supple, no masses, no thyromegaly Respiratory: clear to auscultation bilaterally, no wheezing, no crackles. Normal respiratory effort. No accessory muscle use.  Cardiovascular: Regular rate and rhythm, no murmurs / rubs / gallops. No extremity edema. 2+ pedal pulses. No carotid bruits.  Abdomen: no tenderness, no masses  palpated. No hepatosplenomegaly. Bowel sounds positive.  Musculoskeletal: no clubbing / cyanosis. No joint deformity upper and lower extremities. Good ROM, no contractures. Normal muscle tone.  Skin: no rashes, lesions, ulcers. No induration Neurologic: CN 2-12 grossly intact. Sensation intact, DTR normal. Strength 5/5 in all 4.  Psychiatric: Normal judgment and insight. Alert and oriented x 3. Normal mood.     Labs on Admission: I have personally reviewed following labs and imaging studies  CBC: Recent Labs  Lab 07/06/18 1857  WBC 7.4  HGB 12.3  HCT 36.9  MCV 88.1  PLT 301   Basic Metabolic Panel: Recent Labs  Lab 07/06/18 1857  NA 147*  K 3.2*  CL 109  CO2 29  GLUCOSE 97  BUN 6*  CREATININE 1.00  CALCIUM 9.7   GFR: Estimated Creatinine Clearance: 43.5 mL/min (by C-G formula based on SCr of 1 mg/dL). Liver Function Tests: No results for input(s): AST, ALT, ALKPHOS, BILITOT, PROT, ALBUMIN in the last 168 hours. No results for input(s): LIPASE, AMYLASE in the last 168 hours. No results for input(s): AMMONIA in the last 168 hours. Coagulation Profile: No results for input(s): INR, PROTIME in the last 168 hours. Cardiac Enzymes: Recent Labs  Lab 07/06/18 1857 07/06/18 2157  TROPONINI <0.03 <0.03   BNP (last 3 results) No results for input(s): PROBNP in the last 8760 hours. HbA1C: No results for input(s): HGBA1C in the last 72 hours. CBG: No results for input(s): GLUCAP in the last 168 hours. Lipid Profile: No results for input(s): CHOL, HDL, LDLCALC, TRIG, CHOLHDL, LDLDIRECT in the last 72 hours. Thyroid Function Tests: No results for input(s): TSH, T4TOTAL, FREET4, T3FREE, THYROIDAB in the last 72 hours. Anemia Panel: No results for input(s): VITAMINB12, FOLATE, FERRITIN, TIBC, IRON, RETICCTPCT in the last 72 hours. Urine analysis:    Component Value Date/Time   COLORURINE YELLOW 06/21/2018 2302   APPEARANCEUR HAZY (A) 06/21/2018 2302   LABSPEC 1.014  06/21/2018 2302   PHURINE 6.0 06/21/2018 2302   GLUCOSEU NEGATIVE 06/21/2018 2302   HGBUR NEGATIVE 06/21/2018 2302   BILIRUBINUR NEGATIVE 06/21/2018 2302   KETONESUR NEGATIVE 06/21/2018 2302   PROTEINUR NEGATIVE 06/21/2018 2302   UROBILINOGEN 0.2 11/20/2017 2006   NITRITE NEGATIVE 06/21/2018 2302  LEUKOCYTESUR LARGE (A) 06/21/2018 2302   Sepsis Labs: @LABRCNTIP (procalcitonin:4,lacticidven:4) )No results found for this or any previous visit (from the past 240 hour(s)).   Radiological Exams on Admission: Dg Chest 2 View  Result Date: 07/06/2018 CLINICAL DATA:  Chest pain. EXAM: CHEST - 2 VIEW COMPARISON:  Radiographs of June 21, 2018. FINDINGS: The heart size and mediastinal contours are within normal limits. Both lungs are clear. No pneumothorax or pleural effusion is noted. The visualized skeletal structures are unremarkable. IMPRESSION: No active cardiopulmonary disease. Electronically Signed   By: Lupita Raider, M.D.   On: 07/06/2018 19:03   Ct Angio Chest Pe W And/or Wo Contrast  Result Date: 07/06/2018 CLINICAL DATA:  62 year old female with shortness of breath. EXAM: CT ANGIOGRAPHY CHEST WITH CONTRAST TECHNIQUE: Multidetector CT imaging of the chest was performed using the standard protocol during bolus administration of intravenous contrast. Multiplanar CT image reconstructions and MIPs were obtained to evaluate the vascular anatomy. CONTRAST:  ISOVUE-370 IOPAMIDOL (ISOVUE-370) INJECTION 76% COMPARISON:  Chest radiograph dated 07/06/2018 FINDINGS: Cardiovascular: There is no cardiomegaly or pericardial effusion. Coronary vascular calcification of the LAD. Mild atherosclerotic calcification of the aortic arch. There is no CT evidence of pulmonary embolism. Mediastinum/Nodes: No hilar or mediastinal adenopathy. Small hiatal hernia. The esophagus and the thyroid gland are grossly unremarkable. No mediastinal fluid collection. Lungs/Pleura: There is emphysematous changes of the  lungs. No focal consolidation, pleural effusion, or pneumothorax. The central airways are patent. Upper Abdomen: Left adrenal thickening/hyperplasia versus adenoma. An 8 mm left renal interpolar nonobstructing calculus. Musculoskeletal: No chest wall abnormality. No acute or significant osseous findings. Review of the MIP images confirms the above findings. IMPRESSION: No acute intrathoracic pathology. No CT evidence of pulmonary embolism. Electronically Signed   By: Elgie Collard M.D.   On: 07/06/2018 21:50    EKG: Independently reviewed.  EKG showed normal sinus rhythm with artifacts but no significant ST changes  Assessment/Plan Principal Problem:   Chest pain Active Problems:   Essential hypertension   Hyperlipidemia   Tobacco use   Chronic obstructive pulmonary disease (HCC)     #1 chest pain: Patient has significant risk factors for coronary artery disease.  We will admit her and rule out MI.  If enzymes are negative x3 we will proceed with nuclear medicine cardiac stress testing.  Monitor patient on telemetry.  Nitroglycerin ordered.  Beta-blockers as well as supportive care.  #2 hypertension: Patient already on blood pressure medications.  Continue treatment.  Need to confirm correct home regimen in the morning.  #3 tobacco abuse: Counseling provided.  Nicotine patch ordered.  #4 COPD: No exacerbation.  Continue close monitoring.  #5 hyperlipidemia: Patient currently to be on statin.  #6 hypokalemia: Replete potassium.  Most likely due to her hydrochlorothiazide use.  #7 hypernatremia: Mild dehydration probably related to diuretics.   DVT prophylaxis: Lovenox Code Status: Full code Family Communication: Daughter Disposition Plan: Home Consults called: None Admission status: Observation  Severity of Illness: The appropriate patient status for this patient is OBSERVATION. Observation status is judged to be reasonable and necessary in order to provide the required  intensity of service to ensure the patient's safety. The patient's presenting symptoms, physical exam findings, and initial radiographic and laboratory data in the context of their medical condition is felt to place them at decreased risk for further clinical deterioration. Furthermore, it is anticipated that the patient will be medically stable for discharge from the hospital within 2 midnights of admission. The following factors support  the patient status of observation.   " The patient's presenting symptoms include chest pain. " The physical exam findings include no significant findings. " The initial radiographic and laboratory data are normal EKG on troponin.     Lonia Blood MD [facility] Pager [phone]  If 7PM-7AM, please contact night-coverage [email or web address] Password [facility]  07/06/2018, 10:44 PM

## 2018-07-06 NOTE — ED Notes (Signed)
ED TO INPATIENT HANDOFF REPORT  Name/Age/Gender Tammy Schwartz 62 y.o. female  Code Status Code Status History    Date Active Date Inactive Code Status Order ID Comments User Context   05/17/2018 2227 05/20/2018 1944 Full Code [id number]  Jani Gravel, MD ED   05/07/2016 0711 05/08/2016 2127 Full Code [id number]  Radene Gunning, NP ED   01/20/2014 1148 01/20/2014 2055 Full Code [id number]  Ashok Pall, MD Inpatient      Home/SNF/Other Home  Chief Complaint severe chest pains, pressure, hard to swallow  Level of Care/Admitting Diagnosis ED Disposition    ED Disposition Condition Comment   [facility] [[id number]]  Level of Care: Telemetry [5]  Admit to tele based on following criteria: Monitor for Ischemic changes  Diagnosis: Chest pain [008676]  Admitting Physician: Elwyn Reach [[id number]]  Attending Physician: Elwyn Reach [[id number]]  PT Class (Do Not Modify): Observation [104]  PT Acc Code (Do Not Modify): Observation [10022]       Medical History Past Medical History:  Diagnosis Date  . Chronic back pain    HNp,spondylosis,radiculopathy  . COPD (chronic obstructive pulmonary disease) ([place])   . Hyperlipidemia    was on choloesterol meds last yr-samples given in office but not needed since  . Hypertension    takes Exforge daily  . Joint pain   . Pneumonia    hx of;many yrs ago  . Shortness of breath    takes Singulair daily;with exertion  . Sickle cell trait ([place])   . Spondylolysis   . Tobacco use     Allergies No Known Allergies  IV Location/Drains/Wounds Patient Lines/Drains/Airways Status   Active Line/Drains/Airways    Name:   Placement date:   Placement time:   Site:   Days:   Peripheral IV 07/06/18 Right Forearm   07/06/18    2003    Forearm   less than 1   Incision 06/04/12 Back Bilateral   06/04/12    1527     2223   Incision 06/16/13 Back Bilateral   06/16/13    1410     1846   Incision (Closed) 01/20/14 Back  Other (Comment)   01/20/14    0938     1628          Labs/Imaging Results for orders placed or performed during the hospital encounter of 07/06/18 (from the past 48 hour(s))  Basic metabolic panel     Status: Abnormal   Collection Time: 07/06/18  6:57 PM  Result Value Ref Range   Sodium 147 (H) 135 - 145 mmol/L   Potassium 3.2 (L) 3.5 - 5.1 mmol/L   Chloride 109 98 - 111 mmol/L   CO2 29 22 - 32 mmol/L   Glucose, Bld 97 70 - 99 mg/dL   BUN 6 (L) 8 - 23 mg/dL   Creatinine, Ser 1.00 0.44 - 1.00 mg/dL   Calcium 9.7 8.9 - 10.3 mg/dL   GFR calc non Af Amer 59 (L) >60 mL/min   GFR calc Af Amer >60 >60 mL/min    Comment: (NOTE) The eGFR has been calculated using the CKD EPI equation. This calculation has not been validated in all clinical situations. eGFR's persistently <60 mL/min signify possible Chronic Kidney Disease.    Anion gap 9 5 - 15    Comment: Performed at [facility], [place] [address], [place], [place] [address]  CBC     Status: Abnormal   Collection  Time: 07/06/18  6:57 PM  Result Value Ref Range   WBC 7.4 4.0 - 10.5 K/uL   RBC 4.19 3.87 - 5.11 MIL/uL   Hemoglobin 12.3 12.0 - 15.0 g/dL   HCT 36.9 36.0 - 46.0 %   MCV 88.1 78.0 - 100.0 fL   MCH 29.4 26.0 - 34.0 pg   MCHC 33.3 30.0 - 36.0 g/dL   RDW 15.9 (H) 11.5 - 15.5 %   Platelets 301 150 - 400 K/uL    Comment: Performed at [facility], [place] [address] [place], [place] [address]  Troponin I     Status: None   Collection Time: 07/06/18  6:57 PM  Result Value Ref Range   Troponin I <0.03 <0.03 ng/mL    Comment: Performed at [facility], [place] [address] [place], [place] [address]  Troponin I     Status: None   Collection Time: 07/06/18  9:57 PM  Result Value Ref Range   Troponin I <0.03 <0.03 ng/mL    Comment: Performed at [facility], [place] [address] [place],  [address]   Dg Chest 2 View  Result Date: 07/06/2018 CLINICAL  DATA:  Chest pain. EXAM: CHEST - 2 VIEW COMPARISON:  Radiographs of June 21, 2018. FINDINGS: The heart size and mediastinal contours are within normal limits. Both lungs are clear. No pneumothorax or pleural effusion is noted. The visualized skeletal structures are unremarkable. IMPRESSION: No active cardiopulmonary disease. Electronically Signed   By: Marijo Conception, M.D.   On: 07/06/2018 19:03   Ct Angio Chest Pe W And/or Wo Contrast  Result Date: 07/06/2018 CLINICAL DATA:  62 year old female with shortness of breath. EXAM: CT ANGIOGRAPHY CHEST WITH CONTRAST TECHNIQUE: Multidetector CT imaging of the chest was performed using the standard protocol during bolus administration of intravenous contrast. Multiplanar CT image reconstructions and MIPs were obtained to evaluate the vascular anatomy. CONTRAST:  125m ISOVUE-370 IOPAMIDOL (ISOVUE-370) INJECTION 76% COMPARISON:  Chest radiograph dated 07/06/2018 FINDINGS: Cardiovascular: There is no cardiomegaly or pericardial effusion. Coronary vascular calcification of the LAD. Mild atherosclerotic calcification of the aortic arch. There is no CT evidence of pulmonary embolism. Mediastinum/Nodes: No hilar or mediastinal adenopathy. Small hiatal hernia. The esophagus and the thyroid gland are grossly unremarkable. No mediastinal fluid collection. Lungs/Pleura: There is emphysematous changes of the lungs. No focal consolidation, pleural effusion, or pneumothorax. The central airways are patent. Upper Abdomen: Left adrenal thickening/hyperplasia versus adenoma. An 8 mm left renal interpolar nonobstructing calculus. Musculoskeletal: No chest wall abnormality. No acute or significant osseous findings. Review of the MIP images confirms the above findings. IMPRESSION: No acute intrathoracic pathology. No CT evidence of pulmonary embolism. Electronically Signed   By: AAnner CreteM.D.   On: 07/06/2018 21:50    Pending Labs Unresulted Labs (From admission, onward)     Start     Ordered   07/06/18 2237  Urine rapid drug screen (hosp performed)  STAT,   STAT     07/06/18 2237   Signed and Held  HIV antibody (Routine Testing)  Once,   R     Signed and Held   Signed and Held  Troponin I-serum (0, 3, 6 hours)  Now then every 3 hours,   TIMED     Signed and Held   Signed and Held  CBC  (enoxaparin (LOVENOX) full dose)  Once,   R    Comments:  Baseline for enoxaparin therapy IF NOT ALREADY DRAWN.  Notify MD  if PLT < 100 K.    Signed and Held   Signed and Held  Creatinine, serum  (enoxaparin (LOVENOX) full dose)  Once,   R    Comments:  Baseline for enoxaparin therapy IF NOT ALREADY DRAWN.    Signed and Held   Signed and Held  Creatinine, serum  (enoxaparin (LOVENOX) full dose)  Weekly,   R    Comments:  while on enoxaparin therapy.    Signed and Held          Vitals/Pain Today's Vitals   07/06/18 2200 07/06/18 2230 07/06/18 2300 07/06/18 2330  BP: 131/88 128/80 118/72 120/70  Pulse: 87 89 80 84  Resp: '14 11 11 13  ' Temp:      TempSrc:      SpO2: 100% 100% 100% 100%  Weight:      Height:      PainSc:        Isolation Precautions No active isolations  Medications Medications  iopamidol (ISOVUE-370) 76 % injection (has no administration in time range)  aspirin chewable tablet 324 mg (has no administration in time range)  nitroGLYCERIN (NITROSTAT) SL tablet 0.4 mg (has no administration in time range)  gi cocktail (Maalox,Lidocaine,Donnatal) (30 mLs Oral Given 07/06/18 2045)  ipratropium-albuterol (DUONEB) 0.5-2.5 (3) MG/3ML nebulizer solution 3 mL (3 mLs Nebulization Given 07/06/18 2150)  famotidine (PEPCID) IVPB 20 mg premix (0 mg Intravenous Stopped 07/06/18 2233)  iopamidol (ISOVUE-370) 76 % injection 100 mL (100 mLs Intravenous Contrast Given 07/06/18 2123)    Mobility walks

## 2018-07-06 NOTE — ED Triage Notes (Addendum)
Patient c/o intermittent mid chest pain since last night. Patient also c/o difficulty swallowing and states when she swallows water, it does not go down without causing pain  Patient is able to swallow her own saliva.

## 2018-07-06 NOTE — ED Notes (Signed)
Provided patient a warm blanket

## 2018-07-06 NOTE — ED Notes (Signed)
Gave report to [name], Charity fundraiserN for room [phone]

## 2018-07-06 NOTE — ED Provider Notes (Signed)
[facility]-EMERGENCY DEPT Provider Note   CSN: [id number] Arrival date & time: 07/06/18  1747     History   Chief Complaint Chief Complaint  Patient presents with  . Chest Pain  . Dysphagia    HPI Tammy Schwartz is a 62 y.o. female.  HPI  Tammy Schwartz is a 62 y.o. female, with a history of COPD, HTN, and tobacco use, presenting to the ED with chest pain for last 2 days.  Pain is central and upper chest, sharp, moderate to severe, worse with swallowing food or drink, nonradiating. She has had a nonproductive cough, but states this is typical for her.  Denies fever/chills, shortness of breath, N/V/C/D, syncope, lower extremity edema or pain, dizziness, neck pain, throat pain/swelling, drooling, or any other complaints.    Past Medical History:  Diagnosis Date  . Chronic back pain    HNp,spondylosis,radiculopathy  . COPD (chronic obstructive pulmonary disease) ([facility])   . Hyperlipidemia    was on choloesterol meds last yr-samples given in office but not needed since  . Hypertension    takes Exforge daily  . Joint pain   . Pneumonia    hx of;many yrs ago  . Shortness of breath    takes Singulair daily;with exertion  . Sickle cell trait ([facility])   . Spondylolysis   . Tobacco use     Patient Active Problem List   Diagnosis Date Noted  . Cellulitis 05/17/2018  . Lumbar radiculopathy 02/27/2017  . Poor dentition 02/11/2017  . Acute bronchitis   . Chest pain 05/07/2016  . Chest pain at rest 05/07/2016  . Essential hypertension   . Hyperlipidemia   . Tobacco use   . Spondylolysis   . Chronic obstructive pulmonary disease ([facility])   . Lumbar spondylosis 01/20/2014  . HNP (herniated nucleus pulposus), lumbar 06/04/2012    Past Surgical History:  Procedure Laterality Date  . BACK SURGERY    . BREAST EXCISIONAL BIOPSY Left 2007   benign  . BREAST SURGERY Left    lumpectomy  . COLONOSCOPY    . DILATION AND CURETTAGE OF UTERUS    .  LUMBAR LAMINECTOMY/DECOMPRESSION MICRODISCECTOMY  06/04/2012   Procedure: LUMBAR LAMINECTOMY/DECOMPRESSION MICRODISCECTOMY 1 LEVEL;  Surgeon: Carmela Hurt, MD;  Location: [facility];  Service: Neurosurgery;  Laterality: Left;  LEFT Lumbar five-sacral one diskectomy  . LUMBAR LAMINECTOMY/DECOMPRESSION MICRODISCECTOMY Right 06/16/2013   Procedure: RIGHT Lumbar Five-Sacral One Microdiskectomy;  Surgeon: Carmela Hurt, MD;  Location: [facility];  Service: Neurosurgery;  Laterality: Right;  RIGHT Lumbar Five-Sacral One Microdiskectomy  . LUMBAR LAMINECTOMY/DECOMPRESSION MICRODISCECTOMY Right 01/20/2014   Procedure: LUMBAR FOUR TO LUMBAR FIVE LUMBAR LAMINECTOMY/DECOMPRESSION MICRODISCECTOMY 1 LEVEL;  Surgeon: Carmela Hurt, MD;  Location: [facility];  Service: Neurosurgery;  Laterality: Right;  Right L45 laminectomy and foramintomy     OB History    Gravida  2   Para      Term      Preterm      AB      Living  2     SAB      TAB      Ectopic      Multiple      Live Births  2            Home Medications    Prior to Admission medications   Medication Sig Start Date End Date Taking? Authorizing Provider  amLODIPine-Valsartan-HCTZ 5-160-12.5 MG TABS Take 1 tablet by mouth  2 (two) times daily.   Yes [provider]  aspirin EC 81 MG tablet Take 162 mg by mouth daily.   Yes [provider]  cetirizine (ZYRTEC) 10 MG tablet Take 1 tablet (10 mg total) by mouth daily. 09/28/17  Yes Anders Simmonds, PA-C  diclofenac (VOLTAREN) 75 MG EC tablet Take 1 tablet (75 mg total) by mouth 2 (two) times daily. Prn pain Patient taking differently: Take 75 mg by mouth 2 (two) times daily as needed for mild pain.  03/31/18  Yes Hoy Register, MD  DULoxetine (CYMBALTA) 60 MG capsule Take 1 capsule (60 mg total) by mouth daily. 03/31/18  Yes Newlin, Odette Horns, MD  fluticasone (FLONASE) 50 MCG/ACT nasal spray Place 1 spray into both nostrils daily. 03/31/18  Yes Newlin,  Enobong, MD  fluticasone furoate-vilanterol (BREO ELLIPTA) 100-25 MCG/INH AEPB Inhale 1 puff into the lungs daily. 03/31/18  Yes Hoy Register, MD  gabapentin (NEURONTIN) 300 MG capsule Take 2 capsules (600 mg total) by mouth 3 (three) times daily. 03/31/18  Yes Hoy Register, MD  hydrOXYzine (ATARAX/VISTARIL) 25 MG tablet Take 1 tablet (25 mg total) by mouth every 8 (eight) hours as needed. 03/31/18  Yes Hoy Register, MD  lisinopril-hydrochlorothiazide (ZESTORETIC) 20-12.5 MG tablet Take 2 tablets by mouth daily. 03/31/18  Yes Hoy Register, MD  mirtazapine (REMERON) 30 MG tablet Take 1 tablet (30 mg total) by mouth at bedtime. 05/24/18  Yes Claiborne Rigg, NP  Multiple Vitamin (MULTIVITAMIN WITH MINERALS) TABS Take 1 tablet by mouth daily.   Yes [provider]  pregabalin (LYRICA) 150 MG capsule Take 150 mg by mouth at bedtime.   Yes [provider]  tiZANidine (ZANAFLEX) 4 MG tablet Take 1 tablet (4 mg total) by mouth every 8 (eight) hours as needed for muscle spasms. 03/31/18  Yes Hoy Register, MD  traMADol (ULTRAM) 50 MG tablet Take 1 tablet (50 mg total) by mouth at bedtime. 03/31/18  Yes Newlin, Odette Horns, MD  VENTOLIN HFA 108 (90 Base) MCG/ACT inhaler Inhale 2 puffs into the lungs every 6 (six) hours as needed. 06/08/17  Yes Hoy Register, MD  acetaminophen (TYLENOL) 325 MG tablet Take 2 tablets (650 mg total) by mouth every 6 (six) hours as needed. Patient not taking: Reported on 07/06/2018 10/08/17   Linus Mako B, NP  benzonatate (TESSALON) 100 MG capsule Take 1 capsule (100 mg total) by mouth every 8 (eight) hours. Patient not taking: Reported on 07/06/2018 06/26/18   Alvino Chapel, [place], PA-C  ipratropium (ATROVENT) 0.06 % nasal spray Place 2 sprays into both nostrils 4 (four) times daily. Patient not taking: Reported on 07/06/2018 11/11/16   Linna Hoff, MD  lactulose (CHRONULAC) 10 GM/15ML solution Take 15 mLs (10 g total) by mouth 2 (two) times daily as needed  for mild constipation. 06/23/17   Hoy Register, MD  montelukast (SINGULAIR) 10 MG tablet Take 10 mg by mouth daily.     [provider]  polyethylene glycol powder (GLYCOLAX/MIRALAX) powder Take 17 g by mouth daily. Patient taking differently: Take 17 g by mouth daily as needed for mild constipation.  06/09/17   Hoy Register, MD  triamcinolone cream (KENALOG) 0.1 % APPLY 1 APPLICATION TOPICALLY 2 TIMES DAILY. Patient not taking: No sig reported 04/01/18   Hoy Register, MD    Family History Family History  Problem Relation Age of Onset  . Cancer - Other Mother   . CAD Father   . Hypertension Sister     Social History Social  History   Tobacco Use  . Smoking status: Current Every Day Smoker    Packs/day: 0.25    Years: 20.00    Pack years: 5.00    Types: Cigarettes  . Smokeless tobacco: Never Used  Substance Use Topics  . Alcohol use: No  . Drug use: No     Allergies   Patient has no known allergies.   Review of Systems Review of Systems  Constitutional: Negative for chills, diaphoresis and fever.  HENT: Negative for drooling, sore throat, trouble swallowing and voice change.   Respiratory: Negative for shortness of breath.   Cardiovascular: Positive for chest pain. Negative for palpitations and leg swelling.  Gastrointestinal: Negative for abdominal pain, diarrhea, nausea and vomiting.  Musculoskeletal: Negative for back pain and neck pain.  Neurological: Negative for dizziness, syncope, weakness, light-headedness and numbness.  All other systems reviewed and are negative.    Physical Exam Updated Vital Signs BP (!) 149/81 (BP Location: Left Arm)   Pulse (!) 113   Temp 98.8 F (37.1 C) (Oral)   Resp 20   Ht 5\' 1"  (1.549 m)   Wt 54.4 kg   LMP 03/17/2012   SpO2 97%   BMI 22.67 kg/m   Physical Exam  Constitutional: She is oriented to person, place, and time. She appears well-developed and well-nourished. No distress.  HENT:  Head:  Normocephalic and atraumatic.  Mouth/Throat: Oropharynx is clear and moist.  Handles oral secretions without noted difficulty.  Eyes: Conjunctivae are normal.  Neck: Normal range of motion. Neck supple.  Cardiovascular: Normal rate, regular rhythm, normal heart sounds and intact distal pulses.  Pulmonary/Chest: Effort normal and breath sounds normal. No respiratory distress.  No increased work of breathing.  Speaks in full sentences without difficulty.  Abdominal: Soft. There is no tenderness. There is no guarding.  Musculoskeletal: She exhibits no edema.  Lymphadenopathy:    She has no cervical adenopathy.  Neurological: She is alert and oriented to person, place, and time.  Skin: Skin is warm and dry. She is not diaphoretic.  Psychiatric: She has a normal mood and affect. Her behavior is normal.  Nursing note and vitals reviewed.    ED Treatments / Results  Labs (all labs ordered are listed, but only abnormal results are displayed) Labs Reviewed  BASIC METABOLIC PANEL - Abnormal; Notable for the following components:      Result Value   Sodium 147 (*)    Potassium 3.2 (*)    BUN 6 (*)    GFR calc non Af Amer 59 (*)    All other components within normal limits  CBC - Abnormal; Notable for the following components:   RDW 15.9 (*)    All other components within normal limits  TROPONIN I  TROPONIN I  RAPID URINE DRUG SCREEN, HOSP PERFORMED    EKG None  ED ECG REPORT   Date: 07/06/2018  Rate: 81  Rhythm: normal sinus rhythm  QRS Axis: normal  Intervals: QT prolonged  ST/T Wave abnormalities: nonspecific T wave changes  Conduction Disutrbances:none  Narrative Interpretation:   Old EKG Reviewed: unchanged  I have personally reviewed the EKG tracing and agree with the computerized printout as noted.        Radiology Dg Chest 2 View  Result Date: 07/06/2018 CLINICAL DATA:  Chest pain. EXAM: CHEST - 2 VIEW COMPARISON:  Radiographs of June 21, 2018.  FINDINGS: The heart size and mediastinal contours are within normal limits. Both lungs are clear. No pneumothorax or  pleural effusion is noted. The visualized skeletal structures are unremarkable. IMPRESSION: No active cardiopulmonary disease. Electronically Signed   By: Lupita Raider, M.D.   On: 07/06/2018 19:03   Ct Angio Chest Pe W And/or Wo Contrast  Result Date: 07/06/2018 CLINICAL DATA:  62 year old female with shortness of breath. EXAM: CT ANGIOGRAPHY CHEST WITH CONTRAST TECHNIQUE: Multidetector CT imaging of the chest was performed using the standard protocol during bolus administration of intravenous contrast. Multiplanar CT image reconstructions and MIPs were obtained to evaluate the vascular anatomy. CONTRAST:  ISOVUE-370 IOPAMIDOL (ISOVUE-370) INJECTION 76% COMPARISON:  Chest radiograph dated 07/06/2018 FINDINGS: Cardiovascular: There is no cardiomegaly or pericardial effusion. Coronary vascular calcification of the LAD. Mild atherosclerotic calcification of the aortic arch. There is no CT evidence of pulmonary embolism. Mediastinum/Nodes: No hilar or mediastinal adenopathy. Small hiatal hernia. The esophagus and the thyroid gland are grossly unremarkable. No mediastinal fluid collection. Lungs/Pleura: There is emphysematous changes of the lungs. No focal consolidation, pleural effusion, or pneumothorax. The central airways are patent. Upper Abdomen: Left adrenal thickening/hyperplasia versus adenoma. An 8 mm left renal interpolar nonobstructing calculus. Musculoskeletal: No chest wall abnormality. No acute or significant osseous findings. Review of the MIP images confirms the above findings. IMPRESSION: No acute intrathoracic pathology. No CT evidence of pulmonary embolism. Electronically Signed   By: Elgie Collard M.D.   On: 07/06/2018 21:50    Procedures Procedures (including critical care time)  Medications Ordered in ED Medications  iopamidol (ISOVUE-370) 76 % injection (has  no administration in time range)  aspirin chewable tablet 324 mg (has no administration in time range)  nitroGLYCERIN (NITROSTAT) SL tablet 0.4 mg (has no administration in time range)  gi cocktail (Maalox,Lidocaine,Donnatal) (30 mLs Oral Given 07/06/18 2045)  ipratropium-albuterol (DUONEB) 0.5-2.5 (3) MG/3ML nebulizer solution 3 mL (3 mLs Nebulization Given 07/06/18 2150)  famotidine (PEPCID) IVPB 20 mg premix (20 mg Intravenous New Bag/Given (Non-Interop) 07/06/18 2203)  iopamidol (ISOVUE-370) 76 % injection 100 mL (100 mLs Intravenous Contrast Given 07/06/18 2123)     Initial Impression / Assessment and Plan / ED Course  I have reviewed the triage vital signs and the nursing notes.  Pertinent labs & imaging results that were available during my care of the patient were reviewed by me and considered in my medical decision making (see chart for details).  Clinical Course as of Jul 06 2256  Tue Jul 06, 2018  2237 Spoke with Dr. Mikeal Hawthorne, hospitalist.  Agrees to admit the patient.   [SJ]    Clinical Course User Index [SJ] Joy, Shawn C, PA-C    Patient presents with complaint of chest pain.  She is vague and inconsistent in her description of her pain.  The pain seems like it would be atypical for ACS.  However, although the CT of the chest shows no PE, it does show calcification of the LAD. HEART score is 4, indicating moderate risk for a cardiac event. Wells criteria score is 1.5, indicating low risk for PE.  Patient admitted for chest pain rule out.  Findings and plan of care discussed with Virgina Norfolk, DO. Dr. Lockie Mola personally evaluated and examined this patient.  Vitals:   07/06/18 1753 07/06/18 2000 07/06/18 2130 07/06/18 2200  BP: (!) 149/81 136/64 (!) 149/78 131/88  Pulse: (!) 113 96 97 87  Resp: 20 (!) 22 18 14   Temp: 98.8 F (37.1 C)     TempSrc: Oral     SpO2: 97% 100% 100% 100%  Weight: 54.4 kg  Height: 5\' 1"  (1.549 m)         Final Clinical Impressions(s) / ED  Diagnoses   Final diagnoses:  Atypical chest pain  Coronary artery calcification    ED Discharge Orders    None       Concepcion LivingJoy, Shawn C, PA-C 07/06/18 2301    Virgina Norfolkuratolo, Adam, DO 07/07/18 0012

## 2018-07-07 ENCOUNTER — Ambulatory Visit: Payer: No Typology Code available for payment source | Admitting: Family Medicine

## 2018-07-07 ENCOUNTER — Observation Stay (HOSPITAL_COMMUNITY): Payer: No Typology Code available for payment source

## 2018-07-07 ENCOUNTER — Other Ambulatory Visit: Payer: Self-pay

## 2018-07-07 ENCOUNTER — Other Ambulatory Visit: Payer: Self-pay | Admitting: Physician Assistant

## 2018-07-07 ENCOUNTER — Encounter (HOSPITAL_COMMUNITY): Payer: Self-pay | Admitting: Cardiology

## 2018-07-07 DIAGNOSIS — R072 Precordial pain: Secondary | ICD-10-CM

## 2018-07-07 DIAGNOSIS — R079 Chest pain, unspecified: Secondary | ICD-10-CM

## 2018-07-07 DIAGNOSIS — R0789 Other chest pain: Secondary | ICD-10-CM

## 2018-07-07 DIAGNOSIS — I2584 Coronary atherosclerosis due to calcified coronary lesion: Secondary | ICD-10-CM

## 2018-07-07 DIAGNOSIS — I2510 Atherosclerotic heart disease of native coronary artery without angina pectoris: Secondary | ICD-10-CM

## 2018-07-07 LAB — TROPONIN I: Troponin I: <0.03 ng/mL (ref <0.03)

## 2018-07-07 LAB — CBC
HCT: 31 % — ABNORMAL LOW (ref 36.0–46.0)
HEMATOCRIT: 36.5 % (ref 36.0–46.0)
HEMOGLOBIN: 12.3 g/dL (ref 12.0–15.0)
Hemoglobin: 10.5 g/dL — ABNORMAL LOW (ref 12.0–15.0)
MCH: 29.8 pg (ref 26.0–34.0)
MCH: 30 pg (ref 26.0–34.0)
MCHC: 33.7 g/dL (ref 30.0–36.0)
MCHC: 33.9 g/dL (ref 30.0–36.0)
MCV: 88.1 fL (ref 78.0–100.0)
MCV: 89 fL (ref 78.0–100.0)
PLATELETS: 273 10*3/uL (ref 150–400)
Platelets: 281 10*3/uL (ref 150–400)
RBC: 3.52 MIL/uL — AB (ref 3.87–5.11)
RBC: 4.1 MIL/uL (ref 3.87–5.11)
RDW: 15.9 % — ABNORMAL HIGH (ref 11.5–15.5)
RDW: 16.2 % — ABNORMAL HIGH (ref 11.5–15.5)
WBC: 7 10*3/uL (ref 4.0–10.5)
WBC: 9.1 10*3/uL (ref 4.0–10.5)

## 2018-07-07 LAB — CREATININE, SERUM
Creatinine, Ser: 0.93 mg/dL (ref 0.44–1.00)
Creatinine, Ser: 0.97 mg/dL (ref 0.44–1.00)
GFR calc Af Amer: >60 mL/min (ref >60)
GFR calc non Af Amer: >60 mL/min (ref >60)

## 2018-07-07 LAB — RAPID URINE DRUG SCREEN, HOSP PERFORMED
AMPHETAMINES: NOT DETECTED
Barbiturates: POSITIVE — AB
Benzodiazepines: NOT DETECTED
COCAINE: NOT DETECTED
OPIATES: POSITIVE — AB
TETRAHYDROCANNABINOL: NOT DETECTED

## 2018-07-07 LAB — HIV ANTIBODY (ROUTINE TESTING W REFLEX): HIV Screen 4th Generation wRfx: NONREACTIVE

## 2018-07-07 MED ORDER — MORPHINE SULFATE (PF) 2 MG/ML IV SOLN
2.0000 mg | INTRAVENOUS | Status: DC | PRN
  Administered 2018-07-07 (×2): 2 mg via INTRAVENOUS
  Filled 2018-07-07 (×2): qty 1

## 2018-07-07 MED ORDER — TIZANIDINE HCL 4 MG PO TABS: 4.0000 mg | ORAL_TABLET | Freq: Three times a day (TID) | ORAL | Status: DC | PRN

## 2018-07-07 MED ORDER — GI COCKTAIL ~~LOC~~
30.0000 mL | Freq: Four times a day (QID) | ORAL | Status: DC | PRN
  Administered 2018-07-07 – 2018-07-08 (×3): 30 mL via ORAL
  Filled 2018-07-07 (×4): qty 30

## 2018-07-07 MED ORDER — NICOTINE 21 MG/24HR TD PT24
21.0000 mg | MEDICATED_PATCH | Freq: Every day | TRANSDERMAL | Status: DC
  Administered 2018-07-07 – 2018-07-08 (×2): 21 mg via TRANSDERMAL
  Filled 2018-07-07 (×2): qty 1

## 2018-07-07 MED ORDER — PREGABALIN 75 MG PO CAPS
150.0000 mg | ORAL_CAPSULE | Freq: Every day | ORAL | Status: DC
  Administered 2018-07-07: 150 mg via ORAL
  Filled 2018-07-07: qty 2

## 2018-07-07 MED ORDER — MONTELUKAST SODIUM 10 MG PO TABS
10.0000 mg | ORAL_TABLET | Freq: Every day | ORAL | Status: DC
  Administered 2018-07-07 – 2018-07-08 (×2): 10 mg via ORAL
  Filled 2018-07-07 (×2): qty 1

## 2018-07-07 MED ORDER — ALBUTEROL SULFATE HFA 108 (90 BASE) MCG/ACT IN AERS: 2.0000 | INHALATION_SPRAY | Freq: Four times a day (QID) | RESPIRATORY_TRACT | Status: DC | PRN

## 2018-07-07 MED ORDER — DULOXETINE HCL 60 MG PO CPEP
60.0000 mg | ORAL_CAPSULE | Freq: Every day | ORAL | Status: DC
  Administered 2018-07-07 – 2018-07-08 (×2): 60 mg via ORAL
  Filled 2018-07-07 (×2): qty 1

## 2018-07-07 MED ORDER — PANTOPRAZOLE SODIUM 40 MG PO TBEC
40.0000 mg | DELAYED_RELEASE_TABLET | Freq: Every day | ORAL | Status: DC
  Administered 2018-07-07 – 2018-07-08 (×2): 40 mg via ORAL
  Filled 2018-07-07 (×2): qty 1

## 2018-07-07 MED ORDER — LORATADINE 10 MG PO TABS
10.0000 mg | ORAL_TABLET | Freq: Every day | ORAL | Status: DC
  Administered 2018-07-07 – 2018-07-08 (×2): 10 mg via ORAL
  Filled 2018-07-07 (×2): qty 1

## 2018-07-07 MED ORDER — ENOXAPARIN SODIUM 40 MG/0.4ML ~~LOC~~ SOLN
40.0000 mg | SUBCUTANEOUS | Status: DC
  Administered 2018-07-07: 40 mg via SUBCUTANEOUS
  Filled 2018-07-07: qty 0.4

## 2018-07-07 MED ORDER — FLUTICASONE PROPIONATE 50 MCG/ACT NA SUSP
1.0000 | Freq: Every day | NASAL | Status: DC
  Administered 2018-07-07 – 2018-07-08 (×2): 1 via NASAL
  Filled 2018-07-07: qty 16

## 2018-07-07 MED ORDER — GABAPENTIN 300 MG PO CAPS
600.0000 mg | ORAL_CAPSULE | Freq: Three times a day (TID) | ORAL | Status: DC
  Administered 2018-07-07 – 2018-07-08 (×5): 600 mg via ORAL
  Filled 2018-07-07 (×5): qty 2

## 2018-07-07 MED ORDER — DICLOFENAC SODIUM 75 MG PO TBEC
75.0000 mg | DELAYED_RELEASE_TABLET | Freq: Two times a day (BID) | ORAL | Status: DC | PRN
  Filled 2018-07-07: qty 1

## 2018-07-07 MED ORDER — MAGNESIUM CITRATE PO SOLN
1.0000 | Freq: Once | ORAL | Status: AC
  Administered 2018-07-07: 1 via ORAL
  Filled 2018-07-07: qty 296

## 2018-07-07 MED ORDER — ADULT MULTIVITAMIN W/MINERALS CH
1.0000 | ORAL_TABLET | Freq: Every day | ORAL | Status: DC
  Administered 2018-07-07 – 2018-07-08 (×2): 1 via ORAL
  Filled 2018-07-07 (×2): qty 1

## 2018-07-07 MED ORDER — MIRTAZAPINE 15 MG PO TABS
30.0000 mg | ORAL_TABLET | Freq: Every day | ORAL | Status: DC
  Administered 2018-07-07 (×2): 30 mg via ORAL
  Filled 2018-07-07 (×2): qty 2

## 2018-07-07 MED ORDER — ACETAMINOPHEN 325 MG PO TABS: 650.0000 mg | ORAL_TABLET | ORAL | Status: DC | PRN

## 2018-07-07 MED ORDER — INFLUENZA VAC SPLIT QUAD 0.5 ML IM SUSY
0.5000 mL | PREFILLED_SYRINGE | INTRAMUSCULAR | Status: AC
  Administered 2018-07-08: 0.5 mL via INTRAMUSCULAR
  Filled 2018-07-07: qty 0.5

## 2018-07-07 MED ORDER — TRAMADOL HCL 50 MG PO TABS
50.0000 mg | ORAL_TABLET | Freq: Every day | ORAL | Status: DC
  Administered 2018-07-07 (×2): 50 mg via ORAL
  Filled 2018-07-07 (×2): qty 1

## 2018-07-07 MED ORDER — ALBUTEROL SULFATE (2.5 MG/3ML) 0.083% IN NEBU
2.5000 mg | INHALATION_SOLUTION | Freq: Four times a day (QID) | RESPIRATORY_TRACT | Status: DC | PRN
  Filled 2018-07-07: qty 3

## 2018-07-07 MED ORDER — LISINOPRIL-HYDROCHLOROTHIAZIDE 20-12.5 MG PO TABS: 2.0000 | ORAL_TABLET | Freq: Every day | ORAL | Status: DC

## 2018-07-07 MED ORDER — AMLODIPINE-VALSARTAN-HCTZ 5-160-12.5 MG PO TABS: 1.0000 | ORAL_TABLET | Freq: Two times a day (BID) | ORAL | Status: DC

## 2018-07-07 MED ORDER — POTASSIUM CHLORIDE CRYS ER 20 MEQ PO TBCR
40.0000 meq | EXTENDED_RELEASE_TABLET | Freq: Two times a day (BID) | ORAL | Status: AC
  Administered 2018-07-07 (×2): 40 meq via ORAL
  Filled 2018-07-07 (×2): qty 2

## 2018-07-07 MED ORDER — HYDROXYZINE HCL 25 MG PO TABS: 25.0000 mg | ORAL_TABLET | Freq: Three times a day (TID) | ORAL | Status: DC | PRN

## 2018-07-07 MED ORDER — ONDANSETRON HCL 4 MG/2ML IJ SOLN: 4.0000 mg | Freq: Four times a day (QID) | INTRAMUSCULAR | Status: DC | PRN

## 2018-07-07 MED ORDER — POLYETHYLENE GLYCOL 3350 17 G PO PACK
17.0000 g | PACK | Freq: Every day | ORAL | Status: DC | PRN
  Administered 2018-07-08: 17 g via ORAL
  Filled 2018-07-07 (×2): qty 1

## 2018-07-07 MED ORDER — ASPIRIN EC 81 MG PO TBEC
162.0000 mg | DELAYED_RELEASE_TABLET | Freq: Every day | ORAL | Status: DC
  Administered 2018-07-07 – 2018-07-08 (×2): 162 mg via ORAL
  Filled 2018-07-07 (×3): qty 2

## 2018-07-07 MED ORDER — ENOXAPARIN SODIUM 60 MG/0.6ML ~~LOC~~ SOLN: 1.0000 mg/kg | Freq: Two times a day (BID) | SUBCUTANEOUS | Status: DC

## 2018-07-07 MED ORDER — FLUTICASONE FUROATE-VILANTEROL 100-25 MCG/INH IN AEPB
1.0000 | INHALATION_SPRAY | Freq: Every day | RESPIRATORY_TRACT | Status: DC
  Administered 2018-07-08: 1 via RESPIRATORY_TRACT
  Filled 2018-07-07: qty 28

## 2018-07-07 MED ORDER — LACTULOSE 10 GM/15ML PO SOLN: 10.0000 g | Freq: Two times a day (BID) | ORAL | Status: DC | PRN

## 2018-07-07 MED ORDER — ENOXAPARIN SODIUM 60 MG/0.6ML ~~LOC~~ SOLN
55.0000 mg | Freq: Two times a day (BID) | SUBCUTANEOUS | Status: DC
  Administered 2018-07-07 (×2): 55 mg via SUBCUTANEOUS
  Filled 2018-07-07 (×3): qty 0.6

## 2018-07-07 NOTE — Progress Notes (Signed)
Taking over care of patient. Agree with previous RN assessment. Denies any needs at this time. Will continue to monitor.  

## 2018-07-07 NOTE — Consult Note (Signed)
CARDIOLOGY CONSULT NOTE  Patient ID: Tammy Schwartz MRN: [id number] DOB/AGE: 1956-03-18 62 y.o.  Admit date: 07/06/2018 Primary Physician Hoy Register, MD Primary Cardiologist   New (Saw Croitoru once 2 years ago) Chief Complaint  Chest pain Requesting  Red Christians  HPI:   The patient has no past cardiac history other than an evaluation for chest pain in 2017.  I reviewed these records.  She was in the hospital and had normal EF on echo.  She had some grade 2 diastolic dysfunction.  Lexiscan Myoview was without ischemia.  She presents with chest discomfort.  She says this is been going on since Saturday or Sunday.  She says the discomfort was severe and she describes it as constant.  It was worse with bending over.  She says is been coming and going.  She continues to have discomfort though she did say Better with GI cocktail.  She presented to the emergency room.  CT was negative for PE.  I could not find an EKG done in the ED.  However, the one I ordered demonstrated no acute changes.   Enzymes have been negative x6.   She does not report radiation to her jaw that might be a little bit of discomfort in the left arm.  She is not sure whether this was similar to 2017.  She thought it might of been reflux.  She denies any PND or orthopnea.  There is no new palpitations.  She seems to indicate some coughing and gagging with a little bit of vomiting.  She says she has some increased shortness of breath.  She is not describing diaphoresis.   Past Medical History:  Diagnosis Date  . Chronic back pain    HNp,spondylosis,radiculopathy  . COPD (chronic obstructive pulmonary disease) (HCC)   . Hyperlipidemia    was on choloesterol meds last yr-samples given in office but not needed since  . Hypertension    takes Exforge daily  . Joint pain   . Pneumonia    hx of;many yrs ago  . Shortness of breath    takes Singulair daily;with exertion  . Sickle cell trait (HCC)   . Spondylolysis   . Tobacco  use     Past Surgical History:  Procedure Laterality Date  . BACK SURGERY    . BREAST EXCISIONAL BIOPSY Left 2007   benign  . BREAST SURGERY Left    lumpectomy  . COLONOSCOPY    . DILATION AND CURETTAGE OF UTERUS    . LUMBAR LAMINECTOMY/DECOMPRESSION MICRODISCECTOMY  06/04/2012   Procedure: LUMBAR LAMINECTOMY/DECOMPRESSION MICRODISCECTOMY 1 LEVEL;  Surgeon: Carmela Hurt, MD;  Location: [facility];  Service: Neurosurgery;  Laterality: Left;  LEFT Lumbar five-sacral one diskectomy  . LUMBAR LAMINECTOMY/DECOMPRESSION MICRODISCECTOMY Right 06/16/2013   Procedure: RIGHT Lumbar Five-Sacral One Microdiskectomy;  Surgeon: Carmela Hurt, MD;  Location: [facility];  Service: Neurosurgery;  Laterality: Right;  RIGHT Lumbar Five-Sacral One Microdiskectomy  . LUMBAR LAMINECTOMY/DECOMPRESSION MICRODISCECTOMY Right 01/20/2014   Procedure: LUMBAR FOUR TO LUMBAR FIVE LUMBAR LAMINECTOMY/DECOMPRESSION MICRODISCECTOMY 1 LEVEL;  Surgeon: Carmela Hurt, MD;  Location: [facility];  Service: Neurosurgery;  Laterality: Right;  Right L45 laminectomy and foramintomy    No Known Allergies Medications Prior to Admission  Medication Sig Dispense Refill Last Dose  . amLODIPine-Valsartan-HCTZ 5-160-12.5 MG TABS Take 1 tablet by mouth 2 (two) times daily.   07/05/2018 at Unknown time  . aspirin EC 81 MG tablet Take 162 mg by mouth daily.  07/05/2018 at Unknown time  . cetirizine (ZYRTEC) 10 MG tablet Take 1 tablet (10 mg total) by mouth daily. 30 tablet 11 Past Week at Unknown time  . diclofenac (VOLTAREN) 75 MG EC tablet Take 1 tablet (75 mg total) by mouth 2 (two) times daily. Prn pain (Patient taking differently: Take 75 mg by mouth 2 (two) times daily as needed for mild pain. ) 60 tablet 3 Past Week at Unknown time  . DULoxetine (CYMBALTA) 60 MG capsule Take 1 capsule (60 mg total) by mouth daily. 30 capsule 3 Past Week at Unknown time  . fluticasone (FLONASE) 50 MCG/ACT nasal spray Place 1 spray into both  nostrils daily. 16 g 0 07/05/2018 at Unknown time  . fluticasone furoate-vilanterol (BREO ELLIPTA) 100-25 MCG/INH AEPB Inhale 1 puff into the lungs daily. 90 each 3 07/06/2018 at Unknown time  . gabapentin (NEURONTIN) 300 MG capsule Take 2 capsules (600 mg total) by mouth 3 (three) times daily. 180 capsule 3 07/05/2018 at Unknown time  . hydrOXYzine (ATARAX/VISTARIL) 25 MG tablet Take 1 tablet (25 mg total) by mouth every 8 (eight) hours as needed. 90 tablet 2 Past Week at Unknown time  . lisinopril-hydrochlorothiazide (ZESTORETIC) 20-12.5 MG tablet Take 2 tablets by mouth daily. 60 tablet 6 07/05/2018 at Unknown time  . mirtazapine (REMERON) 30 MG tablet Take 1 tablet (30 mg total) by mouth at bedtime. 30 tablet 3 Past Week at Unknown time  . Multiple Vitamin (MULTIVITAMIN WITH MINERALS) TABS Take 1 tablet by mouth daily.   07/05/2018 at Unknown time  . pregabalin (LYRICA) 150 MG capsule Take 150 mg by mouth at bedtime.   07/05/2018 at Unknown time  . tiZANidine (ZANAFLEX) 4 MG tablet Take 1 tablet (4 mg total) by mouth every 8 (eight) hours as needed for muscle spasms. 90 tablet 3 Past Week at Unknown time  . traMADol (ULTRAM) 50 MG tablet Take 1 tablet (50 mg total) by mouth at bedtime. 30 tablet 1 Past Week at Unknown time  . VENTOLIN HFA 108 (90 Base) MCG/ACT inhaler Inhale 2 puffs into the lungs every 6 (six) hours as needed. 54 g 3 07/06/2018 at Unknown time  . acetaminophen (TYLENOL) 325 MG tablet Take 2 tablets (650 mg total) by mouth every 6 (six) hours as needed. (Patient not taking: Reported on 07/06/2018) 30 tablet 0 Not Taking at Unknown time  . benzonatate (TESSALON) 100 MG capsule Take 1 capsule (100 mg total) by mouth every 8 (eight) hours. (Patient not taking: Reported on 07/06/2018) 21 capsule 0 Not Taking at Unknown time  . ipratropium (ATROVENT) 0.06 % nasal spray Place 2 sprays into both nostrils 4 (four) times daily. (Patient not taking: Reported on 07/06/2018) 15 mL 1 Not Taking at Unknown  time  . lactulose (CHRONULAC) 10 GM/15ML solution Take 15 mLs (10 g total) by mouth 2 (two) times daily as needed for mild constipation. 946 mL 0 prn  . montelukast (SINGULAIR) 10 MG tablet Take 10 mg by mouth daily.    unknown  . polyethylene glycol powder (GLYCOLAX/MIRALAX) powder Take 17 g by mouth daily. (Patient taking differently: Take 17 g by mouth daily as needed for mild constipation. ) 3350 g 1 prn  . triamcinolone cream (KENALOG) 0.1 % APPLY 1 APPLICATION TOPICALLY 2 TIMES DAILY. (Patient not taking: No sig reported) 45 g 1 Not Taking at Unknown time   Family History  Problem Relation Age of Onset  . Cancer - Other Mother   . CAD Father   .  Hypertension Sister     Social History   Socioeconomic History  . Marital status: Married    Spouse name: Not on file  . Number of children: Not on file  . Years of education: Not on file  . Highest education level: Not on file  Occupational History  . Not on file  Social Needs  . Financial resource strain: Not on file  . Food insecurity:    Worry: Not on file    Inability: Not on file  . Transportation needs:    Medical: Not on file    Non-medical: Not on file  Tobacco Use  . Smoking status: Current Every Day Smoker    Packs/day: 0.25    Years: 20.00    Pack years: 5.00    Types: Cigarettes  . Smokeless tobacco: Never Used  Substance and Sexual Activity  . Alcohol use: No  . Drug use: No  . Sexual activity: Yes    Birth control/protection: Post-menopausal  Lifestyle  . Physical activity:    Days per week: 0 days    Minutes per session: 0 min  . Stress: Only a little  Relationships  . Social connections:    Talks on phone: More than three times a week    Gets together: Once a week    Attends religious service: More than 4 times per year    Active member of club or organization: No    Attends meetings of clubs or organizations: Never    Relationship status: Married  . Intimate partner violence:    Fear of current  or ex partner: No    Emotionally abused: No    Physically abused: No    Forced sexual activity: No  Other Topics Concern  . Not on file  Social History Narrative  . Not on file     ROS:  Leg weakness, fell recently.    As stated in the HPI and negative for all other systems.  Physical Exam: Blood pressure 137/87, pulse 87, temperature 98 F (36.7 C), temperature source Oral, resp. rate 14, height 5\' 1"  (1.549 m), weight 52.6 kg, last menstrual period 03/17/2012, SpO2 90 %.  GENERAL:  Well appearing HEENT:  Pupils equal round and reactive, fundi not visualized, oral mucosa unremarkable NECK:  No jugular venous distention, waveform within normal limits, carotid upstroke brisk and symmetric, no bruits, no thyromegaly LYMPHATICS:  No cervical, inguinal adenopathy LUNGS:  Clear to auscultation bilaterally BACK:  No CVA tenderness CHEST:  Unremarkable HEART:  PMI not displaced or sustained,S1 and S2 within normal limits, no S3, no S4, no clicks, no rubs, no murmurs ABD:  Flat, positive bowel sounds normal in frequency in pitch, no bruits, no rebound, no guarding, no midline pulsatile mass, no hepatomegaly, no splenomegaly EXT:  2 plus pulses throughout, no edema, no cyanosis no clubbing SKIN:  No rashes no nodules NEURO:  Cranial nerves II through XII grossly intact, motor grossly intact throughout PSYCH:  Cognitively intact, oriented to person place and time  Labs: Lab Results  Component Value Date   BUN 6 (L) 07/06/2018   Lab Results  Component Value Date   CREATININE 0.93 07/07/2018   Lab Results  Component Value Date   NA 147 (H) 07/06/2018   K 3.2 (L) 07/06/2018   CL 109 07/06/2018   CO2 29 07/06/2018   Lab Results  Component Value Date   TROPONINI <0.03 07/07/2018   Lab Results  Component Value Date   WBC 7.0 07/07/2018  HGB 10.5 (L) 07/07/2018   HCT 31.0 (L) 07/07/2018   MCV 88.1 07/07/2018   PLT 273 07/07/2018   Lab Results  Component Value Date   CHOL  204 (H) 01/01/2018   HDL 102 01/01/2018   LDLCALC 84 01/01/2018   TRIG 92 01/01/2018   CHOLHDL 2.0 01/01/2018   Lab Results  Component Value Date   ALT 21 06/21/2018   AST 24 06/21/2018   ALKPHOS 60 06/21/2018   BILITOT 0.5 06/21/2018      Radiology:   CXR: FINDINGS: Cardiovascular: There is no cardiomegaly or pericardial effusion. Coronary vascular calcification of the LAD. Mild atherosclerotic calcification of the aortic arch. There is no CT evidence of pulmonary embolism.  Mediastinum/Nodes: No hilar or mediastinal adenopathy. Small hiatal hernia. The esophagus and the thyroid gland are grossly unremarkable. No mediastinal fluid collection.  Lungs/Pleura: There is emphysematous changes of the lungs. No focal consolidation, pleural effusion, or pneumothorax. The central airways are patent.  Upper Abdomen: Left adrenal thickening/hyperplasia versus adenoma. An 8 mm left renal interpolar nonobstructing calculus.  Musculoskeletal: No chest wall abnormality. No acute or significant osseous findings.  Review of the MIP images confirms the above findings.  IMPRESSION: No acute intrathoracic pathology. No CT evidence of pulmonary Embolism.  EKG:  NSR, rate 91, axis WNL, intervals WNL, no acute ST T wave changes.  Low voltage limb leads. 07/07/2018  ASSESSMENT AND PLAN:   CHEST PAIN:   Enzymes negative x 6. Heart Score is 2.  Low risk.  No need for in patient evaluation.  She can have an out patient stress test.  Doubt she will walk a POET (Plain Old Exercise Treadmill).  We can plan out patient perfusion study.    TOBACCO ABUSE:  Educated.    HTN:   BP is controlled.  (One elevated reading.)  Would continue previous out patient meds.    DYSLIPIDEMIA:  LDL in March was 84.  No change in therapy is indicated.    SignedRollene Rotunda 07/07/2018, 12:52 PM

## 2018-07-07 NOTE — Progress Notes (Signed)
PROGRESS NOTE    Tammy Schwartz  WUJ:[id number] DOB: 04-23-1956 DOA: 07/06/2018 PCP: Hoy Register, MD    Brief Narrative:  62 y.o. female with medical history significant of , hypertension, tobacco abuse, COPD and GERD who presented to the ER with substernal chest pain that has been going on for the last 2 days.  Patient had no prior history of coronary artery disease.  Has had some.  Nausea but no vomiting.  Patient has strong family history of coronary artery disease including her father that died in young age from heart disease she has had a reflux type symptoms in the past but the pain this time around is different.  Denies any fever or cough and no significant shortness of breath.  Pain is more pressure-like in the middle of the chest.  Radiated to the left but not to the arm.  Due to risk factors patient is being admitted for MI rule out.  ED Course: Temperature 98.8 blood pressure 149/81, pulse 118.  White count is 7.0, hemoglobin 10.5 with platelet 273.  Sodium 147, potassium 3.2 with creatinine 1.0 and BUN of 6.  Initial CK and troponin are negative.  Glucose 97.  EKG showed no active findings.  Chest x-ray is also negative.  CT angiogram of the chest was negative for PE.  Assessment & Plan:   Principal Problem:   Chest pain Active Problems:   Essential hypertension   Hyperlipidemia   Tobacco use   Chronic obstructive pulmonary disease (HCC)  #1 chest pain:  -Presented with significant risk factors for coronary artery disease.   -Troponin negative serially -EKG reviewed, unremarkable -Cardiology consulted who did not recommend in-house stress test, outpatient testing ordered per Cardiology -Question GI source. Continue GI cocktail as needed -See below. Findings suggestive of constipation. Chest pain noted on bowel movement - Will repeat troponin x 1. Will give trial of GI cocktail - Will check STAT CXR  - Will start PPI  #2 hypertension:  -Patient already on blood  pressure medications. - Continue current regimen as tolerated  #3 tobacco abuse:  -Tobacco cessation done this admission -Continued on Nicotine patch  #4 COPD:  -No wheezing on auscultation at this time -Cont O2 as needed  #5 hyperlipidemia:  -Continued on statin as tolerated  #6 hypokalemia:  -Replaced -Will repeat bmet in AM  #7 hypernatremia:  -Likely secondary to mild dehydration probably related to diuretics. -Repeat lytes in AM  #8 Acute hypoxemia -New finding -While moving bowels, patient had acute "burning" chest pain. Family describes dry heaving -Suspect emesis related to constipation, see below -Given GI cocktail. Will start PPI -CXR ordered to rule out aspiration PNA  #9 Constipation - ordered abd xray and reviewed -findings of stool throughout colon -Given Mg citrate with results noted  DVT prophylaxis: Lovenox subQ Code Status: Full Family Communication: Pt in room, family at bedside Disposition Plan: Uncertain at this time  Consultants:   Cardiology  Procedures:     Antimicrobials: Anti-infectives (From admission, onward)   None       Subjective: Complaining of chest pain and sob  Objective: Vitals:   07/07/18 0035 07/07/18 0604 07/07/18 0857 07/07/18 1435  BP: 105/73 (!) 173/102 137/87 (!) 149/81  Pulse: 86 87  96  Resp: 16 14  16   Temp: 98.4 F (36.9 C) 98 F (36.7 C)  97.6 F (36.4 C)  TempSrc: Oral Oral  Oral  SpO2: 100% 90%  100%  Weight:      Height:  Intake/Output Summary (Last 24 hours) at 07/07/2018 1747 Last data filed at 07/07/2018 1600 Gross per 24 hour  Intake 480 ml  Output 922 ml  Net -442 ml   Filed Weights   07/06/18 1753 07/07/18 0013  Weight: 54.4 kg 52.6 kg    Examination:  General exam: Appears anxious, holding chest, uncomfortable Respiratory system: Clear to auscultation. Respiratory effort normal. Cardiovascular system: S1 & S2 heard, Tachycardic Gastrointestinal system: Abdomen  is nondistended, soft and nontender. No organomegaly or masses felt. Normal bowel sounds heard. Central nervous system: Alert and oriented. No focal neurological deficits. Extremities: Symmetric 5 x 5 power. Skin: No rashes, lesions Psychiatry: Judgement and insight appear normal. Mood & affect appropriate.   Data Reviewed: I have personally reviewed following labs and imaging studies  CBC: Recent Labs  Lab 07/06/18 1857 07/07/18 0040  WBC 7.4 7.0  HGB 12.3 10.5*  HCT 36.9 31.0*  MCV 88.1 88.1  PLT 301 273   Basic Metabolic Panel: Recent Labs  Lab 07/06/18 1857 07/07/18 0040  NA 147*  --   K 3.2*  --   CL 109  --   CO2 29  --   GLUCOSE 97  --   BUN 6*  --   CREATININE 1.00 0.93  CALCIUM 9.7  --    GFR: Estimated Creatinine Clearance: 46.7 mL/min (by C-G formula based on SCr of 0.93 mg/dL). Liver Function Tests: No results for input(s): AST, ALT, ALKPHOS, BILITOT, PROT, ALBUMIN in the last 168 hours. No results for input(s): LIPASE, AMYLASE in the last 168 hours. No results for input(s): AMMONIA in the last 168 hours. Coagulation Profile: No results for input(s): INR, PROTIME in the last 168 hours. Cardiac Enzymes: Recent Labs  Lab 07/06/18 1857 07/06/18 2157 07/07/18 0040 07/07/18 0312 07/07/18 0611  TROPONINI <0.03 <0.03 <0.03 <0.03 <0.03   BNP (last 3 results) No results for input(s): PROBNP in the last 8760 hours. HbA1C: No results for input(s): HGBA1C in the last 72 hours. CBG: No results for input(s): GLUCAP in the last 168 hours. Lipid Profile: No results for input(s): CHOL, HDL, LDLCALC, TRIG, CHOLHDL, LDLDIRECT in the last 72 hours. Thyroid Function Tests: No results for input(s): TSH, T4TOTAL, FREET4, T3FREE, THYROIDAB in the last 72 hours. Anemia Panel: No results for input(s): VITAMINB12, FOLATE, FERRITIN, TIBC, IRON, RETICCTPCT in the last 72 hours. Sepsis Labs: No results for input(s): PROCALCITON, LATICACIDVEN in the last 168 hours.  No  results found for this or any previous visit (from the past 240 hour(s)).   Radiology Studies: Dg Chest 2 View  Result Date: 07/06/2018 CLINICAL DATA:  Chest pain. EXAM: CHEST - 2 VIEW COMPARISON:  Radiographs of June 21, 2018. FINDINGS: The heart size and mediastinal contours are within normal limits. Both lungs are clear. No pneumothorax or pleural effusion is noted. The visualized skeletal structures are unremarkable. IMPRESSION: No active cardiopulmonary disease. Electronically Signed   By: Lupita Raider, M.D.   On: 07/06/2018 19:03   Ct Angio Chest Pe W And/or Wo Contrast  Result Date: 07/06/2018 CLINICAL DATA:  62 year old female with shortness of breath. EXAM: CT ANGIOGRAPHY CHEST WITH CONTRAST TECHNIQUE: Multidetector CT imaging of the chest was performed using the standard protocol during bolus administration of intravenous contrast. Multiplanar CT image reconstructions and MIPs were obtained to evaluate the vascular anatomy. CONTRAST:  ISOVUE-370 IOPAMIDOL (ISOVUE-370) INJECTION 76% COMPARISON:  Chest radiograph dated 07/06/2018 FINDINGS: Cardiovascular: There is no cardiomegaly or pericardial effusion. Coronary vascular calcification of the LAD.  Mild atherosclerotic calcification of the aortic arch. There is no CT evidence of pulmonary embolism. Mediastinum/Nodes: No hilar or mediastinal adenopathy. Small hiatal hernia. The esophagus and the thyroid gland are grossly unremarkable. No mediastinal fluid collection. Lungs/Pleura: There is emphysematous changes of the lungs. No focal consolidation, pleural effusion, or pneumothorax. The central airways are patent. Upper Abdomen: Left adrenal thickening/hyperplasia versus adenoma. An 8 mm left renal interpolar nonobstructing calculus. Musculoskeletal: No chest wall abnormality. No acute or significant osseous findings. Review of the MIP images confirms the above findings. IMPRESSION: No acute intrathoracic pathology. No CT evidence of  pulmonary embolism. Electronically Signed   By: Elgie Collard M.D.   On: 07/06/2018 21:50   Dg Abd Portable 1v  Result Date: 07/07/2018 CLINICAL DATA:  Constipation EXAM: PORTABLE ABDOMEN - 1 VIEW COMPARISON:  None. FINDINGS: The bowel gas pattern is normal. Moderate bowel content is identified throughout colon. There is no bowel obstruction. Residual contrast is identified in the bladder. There is scoliosis of spine. IMPRESSION: Moderate bowel content identified throughout colon. No bowel obstruction identified. Electronically Signed   By: Sherian Rein M.D.   On: 07/07/2018 11:26    Scheduled Meds: . aspirin EC  162 mg Oral Daily  . DULoxetine  60 mg Oral Daily  . enoxaparin (LOVENOX) injection  55 mg Subcutaneous BID  . fluticasone  1 spray Each Nare Daily  . fluticasone furoate-vilanterol  1 puff Inhalation Daily  . gabapentin  600 mg Oral TID  . [START ON 07/08/2018] Influenza vac split quadrivalent PF  0.5 mL Intramuscular Tomorrow-1000  . loratadine  10 mg Oral Daily  . mirtazapine  30 mg Oral QHS  . montelukast  10 mg Oral Daily  . multivitamin with minerals  1 tablet Oral Daily  . nicotine  21 mg Transdermal Daily  . potassium chloride  40 mEq Oral BID  . traMADol  50 mg Oral QHS   Continuous Infusions:   LOS: 0 days   Rickey Barbara, MD [facility] Pager On Amion  If 7PM-7AM, please contact night-coverage 07/07/2018, 5:47 PM

## 2018-07-07 NOTE — Progress Notes (Signed)
Attempted to contact nuclear medicine at [facility] to make aware that cardiologist has seen [phone]. No answer.

## 2018-07-07 NOTE — Progress Notes (Signed)
nuc order. 

## 2018-07-07 NOTE — Progress Notes (Signed)
Pt had a lg BM after drinking Mag Citrate. She came out of the BR crying with C/O chest ;pain  . GI cocktail given and EEG Performed . O2 SAT dropped to 84 % for   2 MIN then  r/t to 98%. Dr Rhona Leavens was notified and  Enter the PT"s room . Orders received

## 2018-07-07 NOTE — Discharge Instructions (Signed)
Protonix Pepcid Carafate Gi follow up     You have a Stress Test scheduled at [facility]. Your doctor has ordered this test to check the blood flow in your heart arteries.  Please arrive 15 minutes early for paperwork. The whole test will take several hours. You may want to bring reading material to remain occupied while undergoing different parts of the test.  Instructions:  No food/drink after midnight the night before.  It is OK to take your morning meds with a sip of water EXCEPT for those types of medicines listed below or otherwise instructed.  No caffeine/decaf products 24 hours before, including medicines such as Excedrin or Goody Powders. Call if there are any questions.   Wear comfortable clothes and shoes.   Special Medication Instructions:  Beta blockers such as metoprolol (Lopressor/Toprol XL), atenolol (Tenormin), carvedilol (Coreg), nebivolol (Bystolic), bisoprolol (Zebeta), propranolol (Inderal) should not be taken for 24 hours before the test.  Calcium channel blockers such as diltiazem (Cardizem) or verapmil (Calan) should not be taken for 24 hours before the test.  Remove nitroglycerin patches and do not take nitrate preparations such as Imdur/isosorbide the day of your test.  No Persantine/Theophylline or Aggrenox medicines should be used within 24 hours of the test.   If you are diabetic, please ask which medications to hold the day of the test  What To Expect: When you arrive in the lab, the technician will inject a small amount of radioactive tracer into your arm through an IV while you are resting quietly. This helps [place] to form pictures of your heart. You will likely only feel a sting from the IV. After a waiting period, resting pictures will be obtained under a big camera. These are the "before" pictures.  Next, you will be prepped for the stress portion of the test. If your doctor has chosen the pharmacologic test, then you will  receive a medicine through your IV that may cause temporary nausea, flushing, shortness of breath and sometimes chest discomfort or vomiting. This is typically short-lived and usually resolves quickly. If you experience symptoms, that does not automatically mean the test is abnormal. Some patients do not experience any symptoms at all. Your blood pressure and heart rate will be monitored, and we will be watching your EKG on a computer screen for any changes. During this portion of the test, the radiologist will inject another small amount of radioactive tracer into your IV. After a waiting period, you will undergo a second set of pictures. These are the "after" pictures.  The doctor reading the test will compare the before-and-after images to look for evidence of heart blockages or heart weakness. The test usually takes 1 day to complete, but in certain instances (for example, if a patient is over a certain weight limit), the test may be done over the span of 2 days.

## 2018-07-07 NOTE — Evaluation (Signed)
Physical Therapy Evaluation Patient Details Name: Tammy Schwartz MRN: [id number] DOB: 1956-03-31 Today's Date: 07/07/2018   History of Present Illness  Pt admitted to ED on 9/25 for mild chest pain, difficulty swallowing. Pt with recent ED visit 9/9 for fatigue, body aches; admitted August for foot cellulitis. PMH includes COPD, tobacco use, HTN, HLD, chronic back pain, PNA, lumbar decompression and laminectomy surgeries.   Clinical Impression   Pt presents with gait deviations, LE weakness, decreased standing balance both statically and dynamically, and decreased activity tolerance. Pt with no reproducible chest pain during session. Pt to benefit from acute PT to address deficits. Pt ambulated 200 ft with no AD, exhibiting variable step width/length and imbalance. PT recommending OPPT at this time to address balance and gait deficits. Pt states she is able to drive to and from appointments, or son could drive her. PT to continue to follow acutely.     Follow Up Recommendations Outpatient PT    Equipment Recommendations  None recommended by PT    Recommendations for Other Services       Precautions / Restrictions Precautions Precautions: Fall      Mobility  Bed Mobility Overal bed mobility: Needs Assistance Bed Mobility: Supine to Sit     Supine to sit: Modified independent (Device/Increase time)     General bed mobility comments: Increased time and effort to get to EOB.   Transfers Overall transfer level: Needs assistance Equipment used: None Transfers: Sit to/from Stand Sit to Stand: Min guard         General transfer comment: Min guard for safety. Increased effort to stand.   Ambulation/Gait Ambulation/Gait assistance: Supervision Gait Distance (Feet): 200 Feet Assistive device: None Gait Pattern/deviations: Step-through pattern;Wide base of support;Drifts right/left Gait velocity: decr    General Gait Details: Variable step length and step width, at times  ataxic in appearance. Pt with no complaints of chest pain during ambulation, no dyspnea or desats noted.   Stairs            Wheelchair Mobility    Modified Rankin (Stroke Patients Only)       Balance Overall balance assessment: Needs assistance;History of Falls Sitting-balance support: No upper extremity supported;Feet unsupported Sitting balance-Leahy Scale: Good       Standing balance-Leahy Scale: Fair Standing balance comment: no AD required, but unable to accept challenge  Single Leg Stance - Right Leg: 5 Single Leg Stance - Left Leg: 3 Tandem Stance - Right Leg: 10 Tandem Stance - Left Leg: 10 Rhomberg - Eyes Opened: 30 Rhomberg - Eyes Closed: 30                 Pertinent Vitals/Pain Pain Assessment: 0-10 Pain Score: 8  Pain Location: chest  Pain Descriptors / Indicators: Sharp Pain Intervention(s): Limited activity within patient's tolerance;Monitored during session    Home Living Family/patient expects to be discharged to:: Private residence Living Arrangements: Spouse/significant other;Children(son) Available Help at Discharge: Family;Available PRN/intermittently Type of Home: House Home Access: Stairs to enter Entrance Stairs-Rails: None Entrance Stairs-Number of Steps: 3 Home Layout: One level Home Equipment: Cane - single point      Prior Function Level of Independence: Independent with assistive device(s)         Comments: using cane for ambulation as needed; had fall in February/March      Hand Dominance   Dominant Hand: Right    Extremity/Trunk Assessment   Upper Extremity Assessment Upper Extremity Assessment: Overall WFL for tasks assessed  Lower Extremity Assessment Lower Extremity Assessment: RLE deficits/detail;LLE deficits/detail RLE Deficits / Details: MMT, bilat: Hip flexion 3+/5, hip abduction/adduction 3+/5, knee extension 4/5, knee flexion 3+/5 LLE Deficits / Details: MMT, bilat: Hip flexion 3+/5, hip  abduction/adduction 3+/5, knee extension 4/5, knee flexion 3+/5    Cervical / Trunk Assessment Cervical / Trunk Assessment: Normal  Communication   Communication: No difficulties;Other (comment)(difficult to understand at times )  Cognition Arousal/Alertness: Awake/alert Behavior During Therapy: WFL for tasks assessed/performed Overall Cognitive Status: Within Functional Limits for tasks assessed                                        General Comments General comments (skin integrity, edema, etc.): Pt with anxiety and reaching during balance tasks, stating "I'm going to fall". PT performed pec and sternal palpation to rule out musculoskeletal contributions to chest pain, no tenderness to palpation.     Exercises     Assessment/Plan    PT Assessment Patient needs continued PT services  PT Problem List Decreased strength;Pain;Decreased activity tolerance;Decreased balance;Decreased safety awareness;Decreased mobility       PT Treatment Interventions Therapeutic activities;Gait training;Therapeutic exercise;Patient/family education;Stair training;Balance training;Functional mobility training;Neuromuscular re-education    PT Goals (Current goals can be found in the Care Plan section)  Acute Rehab PT Goals PT Goal Formulation: With patient Time For Goal Achievement: 07/21/18 Potential to Achieve Goals: Good    Frequency Min 3X/week   Barriers to discharge        Co-evaluation               AM-PAC PT "6 Clicks" Daily Activity  Outcome Measure Difficulty turning over in bed (including adjusting bedclothes, sheets and blankets)?: None Difficulty moving from lying on back to sitting on the side of the bed? : None Difficulty sitting down on and standing up from a chair with arms (e.g., wheelchair, bedside commode, etc,.)?: Unable Help needed moving to and from a bed to chair (including a wheelchair)?: None Help needed walking in hospital room?: A  Little Help needed climbing 3-5 steps with a railing? : A Little 6 Click Score: 19    End of Session Equipment Utilized During Treatment: Gait belt Activity Tolerance: Patient tolerated treatment well;Patient limited by fatigue Patient left: in chair;with call bell/phone within reach;with family/visitor present Nurse Communication: Mobility status PT Visit Diagnosis: Unsteadiness on feet (R26.81);History of falling (Z91.81)    Time: 4098-1191 PT Time Calculation (min) (ACUTE ONLY): 34 min   Charges:   PT Evaluation $PT Eval Low Complexity: 1 Low PT Treatments $Gait Training: 8-22 mins       Nicola Police, PT Acute Rehabilitation Services Pager [phone]  Office [phone]   Jesalyn Finazzo D Despina Hidden 07/07/2018, 1:44 PM

## 2018-07-07 NOTE — Progress Notes (Addendum)
OP Lexiscan nuclear stress test scheduled 10/1 at 7:30am- arrive at 7:15am  F/u arranged with APP as well  Instructions put in AVS. I could not reach patient as she was in bathroom but spoke with nurse and requested she review instructions.  Orilla Templeman PA-C

## 2018-07-08 DIAGNOSIS — R0902 Hypoxemia: Secondary | ICD-10-CM

## 2018-07-08 LAB — BASIC METABOLIC PANEL
Anion gap: 8 (ref 5–15)
BUN: 8 mg/dL (ref 8–23)
CHLORIDE: 109 mmol/L (ref 98–111)
CO2: 28 mmol/L (ref 22–32)
Calcium: 8.8 mg/dL — ABNORMAL LOW (ref 8.9–10.3)
Creatinine, Ser: 0.91 mg/dL (ref 0.44–1.00)
Glucose, Bld: 115 mg/dL — ABNORMAL HIGH (ref 70–99)
POTASSIUM: 4.6 mmol/L (ref 3.5–5.1)
SODIUM: 145 mmol/L (ref 135–145)

## 2018-07-08 MED ORDER — DICLOFENAC SODIUM 75 MG PO TBEC
75.0000 mg | DELAYED_RELEASE_TABLET | Freq: Two times a day (BID) | ORAL | Status: DC | PRN
  Filled 2018-07-08: qty 1

## 2018-07-08 MED ORDER — PANTOPRAZOLE SODIUM 40 MG PO TBEC: 40.0000 mg | DELAYED_RELEASE_TABLET | Freq: Every day | ORAL | 0 refills | Status: DC

## 2018-07-08 MED ORDER — ENSURE ENLIVE PO LIQD
237.0000 mL | ORAL | Status: DC
  Administered 2018-07-08: 237 mL via ORAL

## 2018-07-08 MED ORDER — SENNOSIDES-DOCUSATE SODIUM 8.6-50 MG PO TABS: 2.0000 | ORAL_TABLET | Freq: Every day | ORAL | 0 refills | Status: AC

## 2018-07-08 MED ORDER — LACTULOSE 10 GM/15ML PO SOLN: 10.0000 g | Freq: Two times a day (BID) | ORAL | Status: DC | PRN

## 2018-07-08 NOTE — Progress Notes (Signed)
Pt ambulated 73ft in hallway on room air. 02 sats 91%

## 2018-07-08 NOTE — Progress Notes (Signed)
Initial Nutrition Assessment  DOCUMENTATION CODES:   Not applicable  INTERVENTION:  - Will order Ensure Enlive once/day, this supplement provides 350 kcal and 20 grams of protein. - Continue to encourage PO intakes.  - If pain/difficulty persists, SLP consult for swallow evaluation may be beneficial.    NUTRITION DIAGNOSIS:   Swallowing difficulty related to acute illness as evidenced by per patient/family report.  GOAL:   Patient will meet greater than or equal to 90% of their needs  MONITOR:   PO intake, Supplement acceptance, Weight trends, Labs  REASON FOR ASSESSMENT:   Consult Assessment of nutrition requirement/status  ASSESSMENT:   62 y.o. female with medical history significant of HTN, tobacco abuse, COPD, and GERD. She presented to the ED with substernal chest pain that had been ongoing for 2 days. She had also been experiencing nausea without vomiting during that time frame. In the past, she has experienced reflux-type symptoms but felt the pain she has been experiencing is different.  Pain is more pressure-like in the middle of the chest, radiated to the left but not to the arm.  Due to risk factors patient is being admitted for MI rule out.  BMI indicates normal weight. Patient is difficult to understand and to keep on topic at times. She had consumed ~50% of breakfast tray (omelet and pancakes) which was on bedside table. Patient states that she has intermittently had difficulty with swallowing for undeterminable amount of time. She states that before it seemed like reflux but now it feels like items are getting stuck and will not go down. She has not thrown up d/t this sensation but feels like she needs to. Patient reported doing well with soft foods for breakfast and that pain with swallowing is still present but has subsided. She does not have teeth and forgot dentures at home, usually wears them when eating. She denies any difficulties with chewing.  Patient  reports pain and weakness to BLE and that she has had falls d/t this. She reports most recent fall was about a month ago and that she hit her head at that time.   Per chart review, current weight is 116 lb and weight on 6/19 was 122 lb. This indicates 6 lb weight loss (5% body weight) in the past 3 months; not significant for time frame.    Medications reviewed; 40 mg oral Protonix/day, 40 mEq oral KCl x1 dose yesterday.  Labs reviewed.      NUTRITION - FOCUSED PHYSICAL EXAM:    Most Recent Value  Orbital Region  No depletion  Upper Arm Region  No depletion  Thoracic and Lumbar Region  No depletion  Buccal Region  No depletion  Temple Region  No depletion  Clavicle Bone Region  No depletion  Clavicle and Acromion Bone Region  Mild depletion  Scapular Bone Region  Mild depletion  Dorsal Hand  No depletion  Patellar Region  No depletion  Anterior Thigh Region  No depletion  Posterior Calf Region  Mild depletion  Edema (RD Assessment)  None  Hair  Reviewed  Eyes  Reviewed  Mouth  Reviewed  Skin  Reviewed  Nails  Reviewed       Diet Order:   Diet Order            Diet Heart Room service appropriate? Yes; Fluid consistency: Thin  Diet effective now              EDUCATION NEEDS:   No education needs have been identified  at this time  Skin:  Skin Assessment: Reviewed RN Assessment  Last BM:  9/26  Height:   Ht Readings from Last 1 Encounters:  07/07/18 5\' 1"  (1.549 m)    Weight:   Wt Readings from Last 1 Encounters:  07/07/18 52.6 kg    Ideal Body Weight:  47.73 kg  BMI:  Body mass index is 21.91 kg/m.  Estimated Nutritional Needs:   Kcal:  4098-1191  Protein:  55-65 grams  Fluid:  >/= 1.5 L/day     Trenton Gammon, MS, RD, LDN, [facility] Inpatient Clinical Dietitian Pager # [phone] After hours/weekend pager # [phone]

## 2018-07-08 NOTE — Discharge Summary (Signed)
Physician Discharge Summary  GEORGI TUEL ZOX:[id number] DOB: [birth year] DOA: 07/06/2018  PCP: Hoy Register, MD  Admit date: 07/06/2018 Discharge date: 07/08/2018  Admitted From: Home Disposition:  Home  Recommendations for Outpatient Follow-up:  1. Follow up with PCP in 1-2 weeks 2. Follow up with Cardiology for outpatient stress test 3. If GERD continues consider GI referral  Discharge Condition:Stable CODE STATUS:Full Diet recommendation: Heart healthy   Brief/Interim Summary: 62 y.o.femalewith medical history significant of, hypertension, tobacco abuse, COPD and GERD who presented to the ER with substernal chest pain that has been going on for the last 2 days. Patient had no prior history of coronary artery disease. Has had some.Nausea but no vomiting. Patient has strong family history of coronary artery disease including her father that died in young age from heart disease she has had a reflux type symptoms in the past but the pain this time around is different. Denies any fever or cough and no significant shortness of breath. Pain is more pressure-like in the middle of the chest. Radiated to the left but not to the arm. Due to risk factors patient is being admitted for MI rule out.  ED Course:Temperature 98.8 blood pressure 149/81, pulse 118.White count is 7.0, hemoglobin 10.5 with platelet 273. Sodium 147, potassium 3.2 with creatinine 1.0 and BUN of 6. Initial CK and troponin are negative. Glucose 97. EKG showed no active findings. Chest x-ray is also negative. CT angiogram of the chest was negative for PE.  #1 chest pain: -Presented with significant risk factors for coronary artery disease.  -Troponin negative serially -EKG reviewed, unremarkable -Cardiology consulted who did not recommend in-house stress test, outpatient testing ordered per Cardiology -Question GI source. Continue GI cocktail as needed -See below. Findings suggestive of  constipation. Chest pain noted on bowel movement - Will repeat troponin x 1. Improved with GI cocktail - Will start PPI  #2 hypertension: -Patient already on blood pressure medications. - Continue current regimen as tolerated  #3 tobacco abuse: -Tobacco cessation done this admission -Continued on Nicotine patch  #4 COPD:  -No wheezing on auscultation at this time -Cont O2 as needed  #5 hyperlipidemia:  -Continued on statin as tolerated  #6 hypokalemia: -Replaced  #7 hypernatremia: -Likely secondary to mild dehydration probably related to diuretics.  #8 Acute hypoxemia -New finding -While moving bowels, patient had acute "burning" chest pain. Family describes dry heaving -Suspect emesis related to constipation, see below -Given GI cocktail. Will start PPI -CXR ordered and reviewed, clear  #9 Constipation - ordered abd xray and reviewed -findings of stool throughout colon -Given Mg citrate with results noted   Discharge Diagnoses:  Principal Problem:   Chest pain Active Problems:   Essential hypertension   Hyperlipidemia   Tobacco use   Chronic obstructive pulmonary disease (HCC)    Discharge Instructions   Allergies as of 07/08/2018   No Known Allergies     Medication List    STOP taking these medications   benzonatate 100 MG capsule Commonly known as:  TESSALON   ipratropium 0.06 % nasal spray Commonly known as:  ATROVENT   triamcinolone cream 0.1 % Commonly known as:  KENALOG     TAKE these medications   acetaminophen 325 MG tablet Commonly known as:  TYLENOL Take 2 tablets (650 mg total) by mouth every 6 (six) hours as needed.   aspirin EC 81 MG tablet Take 162 mg by mouth daily.   cetirizine 10 MG tablet Commonly known as:  ZYRTEC Take  1 tablet (10 mg total) by mouth daily.   diclofenac 75 MG EC tablet Commonly known as:  VOLTAREN Take 1 tablet (75 mg total) by mouth 2 (two) times daily. Prn pain What changed:     when to take this  reasons to take this  additional instructions   DULoxetine 60 MG capsule Commonly known as:  CYMBALTA Take 1 capsule (60 mg total) by mouth daily.   fluticasone 50 MCG/ACT nasal spray Commonly known as:  FLONASE Place 1 spray into both nostrils daily.   fluticasone furoate-vilanterol 100-25 MCG/INH Aepb Commonly known as:  BREO ELLIPTA Inhale 1 puff into the lungs daily.   gabapentin 300 MG capsule Commonly known as:  NEURONTIN Take 2 capsules (600 mg total) by mouth 3 (three) times daily.   hydrOXYzine 25 MG tablet Commonly known as:  ATARAX/VISTARIL Take 1 tablet (25 mg total) by mouth every 8 (eight) hours as needed.   lactulose 10 GM/15ML solution Commonly known as:  CHRONULAC Take 15 mLs (10 g total) by mouth 2 (two) times daily as needed for mild constipation.   lisinopril-hydrochlorothiazide 20-12.5 MG tablet Commonly known as:  PRINZIDE,ZESTORETIC Take 2 tablets by mouth daily.   mirtazapine 30 MG tablet Commonly known as:  REMERON Take 1 tablet (30 mg total) by mouth at bedtime.   montelukast 10 MG tablet Commonly known as:  SINGULAIR Take 10 mg by mouth daily.   multivitamin with minerals Tabs tablet Take 1 tablet by mouth daily.   pantoprazole 40 MG tablet Commonly known as:  PROTONIX Take 1 tablet (40 mg total) by mouth daily. Start taking on:  07/09/2018   polyethylene glycol powder powder Commonly known as:  GLYCOLAX/MIRALAX Take 17 g by mouth daily. What changed:    when to take this  reasons to take this   tiZANidine 4 MG tablet Commonly known as:  ZANAFLEX Take 1 tablet (4 mg total) by mouth every 8 (eight) hours as needed for muscle spasms.   traMADol 50 MG tablet Commonly known as:  ULTRAM Take 1 tablet (50 mg total) by mouth at bedtime.   VENTOLIN HFA 108 (90 Base) MCG/ACT inhaler Generic drug:  albuterol Inhale 2 puffs into the lungs every 6 (six) hours as needed.      Follow-up Information    [facility] Follow up.   Specialty:  Cardiology Why:  [facility] location - you have a nuclear stress test scheduled on 10/1 at 7:30am. Please arrive at 7:15am. Nothing to eat or drink after midnight the night before. - PLEASE SEE END OF YOUR AFTER-VISIT SUMMARY FOR FULL INSTRUCTIONS Contact information: [address] [place] [address] [phone]       [facility] Course, [place] Follow up.   Specialties:  Cardiology, Radiology Why:  [facility] location - Monday Jul 19, 2018 9:00 AM. Wynema Birch is one of the PAs that works with the cardiology team. Please arrive 15 minutes prior to your appointment to check in. Contact information: [address] [place] [address] [phone]          No Known Allergies  Consultations:  Cardiology  Procedures/Studies: Dg Chest 2 View  Result Date: 07/06/2018 CLINICAL DATA:  Chest pain. EXAM: CHEST - 2 VIEW COMPARISON:  Radiographs of June 21, 2018. FINDINGS: The heart size and mediastinal contours are within normal limits. Both lungs are clear. No pneumothorax or pleural effusion is noted. The visualized skeletal structures are unremarkable. IMPRESSION: No  active cardiopulmonary disease. Electronically Signed   By: Lupita Raider, M.D.   On: 07/06/2018 19:03   Dg Chest 2 View  Result Date: 06/21/2018 CLINICAL DATA:  Cough, fatigue, general body aches EXAM: CHEST - 2 VIEW COMPARISON:  02/07/2017 FINDINGS: There is hyperinflation of the lungs compatible with COPD. Heart and mediastinal contours are within normal limits. No focal opacities or effusions. No acute bony abnormality. IMPRESSION: COPD.  No active disease. Electronically Signed   By: Charlett Nose M.D.   On: 06/21/2018 20:21   Ct Angio Chest Pe W And/or Wo Contrast  Result Date: 07/06/2018 CLINICAL DATA:  62 year old female with shortness of breath. EXAM: CT ANGIOGRAPHY CHEST WITH CONTRAST TECHNIQUE: Multidetector  CT imaging of the chest was performed using the standard protocol during bolus administration of intravenous contrast. Multiplanar CT image reconstructions and MIPs were obtained to evaluate the vascular anatomy. CONTRAST:  ISOVUE-370 IOPAMIDOL (ISOVUE-370) INJECTION 76% COMPARISON:  Chest radiograph dated 07/06/2018 FINDINGS: Cardiovascular: There is no cardiomegaly or pericardial effusion. Coronary vascular calcification of the LAD. Mild atherosclerotic calcification of the aortic arch. There is no CT evidence of pulmonary embolism. Mediastinum/Nodes: No hilar or mediastinal adenopathy. Small hiatal hernia. The esophagus and the thyroid gland are grossly unremarkable. No mediastinal fluid collection. Lungs/Pleura: There is emphysematous changes of the lungs. No focal consolidation, pleural effusion, or pneumothorax. The central airways are patent. Upper Abdomen: Left adrenal thickening/hyperplasia versus adenoma. An 8 mm left renal interpolar nonobstructing calculus. Musculoskeletal: No chest wall abnormality. No acute or significant osseous findings. Review of the MIP images confirms the above findings. IMPRESSION: No acute intrathoracic pathology. No CT evidence of pulmonary embolism. Electronically Signed   By: Elgie Collard M.D.   On: 07/06/2018 21:50   Dg Chest Port 1 View  Result Date: 07/07/2018 CLINICAL DATA:  Chest pain, hypoxia EXAM: PORTABLE CHEST 1 VIEW COMPARISON:  07/06/2018 FINDINGS: Heart and mediastinal contours are within normal limits. No focal opacities or effusions. No acute bony abnormality. IMPRESSION: No active disease. Electronically Signed   By: Charlett Nose M.D.   On: 07/07/2018 18:28   Dg Abd Portable 1v  Result Date: 07/07/2018 CLINICAL DATA:  Constipation EXAM: PORTABLE ABDOMEN - 1 VIEW COMPARISON:  None. FINDINGS: The bowel gas pattern is normal. Moderate bowel content is identified throughout colon. There is no bowel obstruction. Residual contrast is identified in  the bladder. There is scoliosis of spine. IMPRESSION: Moderate bowel content identified throughout colon. No bowel obstruction identified. Electronically Signed   By: Sherian Rein M.D.   On: 07/07/2018 11:26     Subjective: Eager to go home  Discharge Exam: Vitals:   07/07/18 2006 07/08/18 0542  BP: 93/70 120/62  Pulse: (!) 121 81  Resp: 18 20  Temp: 98.1 F (36.7 C) 98.2 F (36.8 C)  SpO2: 96% 95%   Vitals:   07/07/18 0857 07/07/18 1435 07/07/18 2006 07/08/18 0542  BP: 137/87 (!) 149/81 93/70 120/62  Pulse:  96 (!) 121 81  Resp:  16 18 20   Temp:  97.6 F (36.4 C) 98.1 F (36.7 C) 98.2 F (36.8 C)  TempSrc:  Oral  Oral  SpO2:  100% 96% 95%  Weight:      Height:        General: Pt is alert, awake, not in acute distress Cardiovascular: RRR, S1/S2 +, no rubs, no gallops Respiratory: CTA bilaterally, no wheezing, no rhonchi Abdominal: Soft, NT, ND, bowel sounds + Extremities: no edema, no cyanosis   The  results of significant diagnostics from this hospitalization (including imaging, microbiology, ancillary and laboratory) are listed below for reference.     Microbiology: No results found for this or any previous visit (from the past 240 hour(s)).   Labs: BNP (last 3 results) No results for input(s): BNP in the last 8760 hours. Basic Metabolic Panel: Recent Labs  Lab 07/06/18 1857 07/07/18 0040 07/07/18 1813 07/08/18 0533  NA 147*  --   --  145  K 3.2*  --   --  4.6  CL 109  --   --  109  CO2 29  --   --  28  GLUCOSE 97  --   --  115*  BUN 6*  --   --  8  CREATININE 1.00 0.93 0.97 0.91  CALCIUM 9.7  --   --  8.8*   Liver Function Tests: No results for input(s): AST, ALT, ALKPHOS, BILITOT, PROT, ALBUMIN in the last 168 hours. No results for input(s): LIPASE, AMYLASE in the last 168 hours. No results for input(s): AMMONIA in the last 168 hours. CBC: Recent Labs  Lab 07/06/18 1857 07/07/18 0040 07/07/18 1813  WBC 7.4 7.0 9.1  HGB 12.3 10.5* 12.3   HCT 36.9 31.0* 36.5  MCV 88.1 88.1 89.0  PLT 301 273 281   Cardiac Enzymes: Recent Labs  Lab 07/06/18 2157 07/07/18 0040 07/07/18 0312 07/07/18 0611 07/07/18 1813  TROPONINI <0.03 <0.03 <0.03 <0.03 <0.03   BNP: Invalid input(s): POCBNP CBG: No results for input(s): GLUCAP in the last 168 hours. D-Dimer No results for input(s): DDIMER in the last 72 hours. Hgb A1c No results for input(s): HGBA1C in the last 72 hours. Lipid Profile No results for input(s): CHOL, HDL, LDLCALC, TRIG, CHOLHDL, LDLDIRECT in the last 72 hours. Thyroid function studies No results for input(s): TSH, T4TOTAL, T3FREE, THYROIDAB in the last 72 hours.  Invalid input(s): FREET3 Anemia work up No results for input(s): VITAMINB12, FOLATE, FERRITIN, TIBC, IRON, RETICCTPCT in the last 72 hours. Urinalysis    Component Value Date/Time   COLORURINE YELLOW 06/21/2018 2302   APPEARANCEUR HAZY (A) 06/21/2018 2302   LABSPEC 1.014 06/21/2018 2302   PHURINE 6.0 06/21/2018 2302   GLUCOSEU NEGATIVE 06/21/2018 2302   HGBUR NEGATIVE 06/21/2018 2302   BILIRUBINUR NEGATIVE 06/21/2018 2302   KETONESUR NEGATIVE 06/21/2018 2302   PROTEINUR NEGATIVE 06/21/2018 2302   UROBILINOGEN 0.2 11/20/2017 2006   NITRITE NEGATIVE 06/21/2018 2302   LEUKOCYTESUR LARGE (A) 06/21/2018 2302   Sepsis Labs Invalid input(s): PROCALCITONIN,  WBC,  LACTICIDVEN Microbiology No results found for this or any previous visit (from the past 240 hour(s)).  Time spent: 30 min  SIGNED:   Rickey Barbara, MD  [facility] 07/08/2018, 4:45 PM  If 7PM-7AM, please contact night-coverage

## 2018-07-08 NOTE — Progress Notes (Signed)
RT attempted to do a breathing Tx on the Pt 4 times. The Pt has been in the restroom for 25 minutes. RT will try to check on Pt again

## 2018-07-08 NOTE — Progress Notes (Signed)
Progress Note  Patient Name: Tammy Schwartz Date of Encounter: 07/08/2018  Primary Cardiologist:   Rollene Rotunda, MD  Subjective   No chest pain.  She reports that she had some blood on her toilet paper.    Inpatient Medications    Scheduled Meds: . aspirin EC  162 mg Oral Daily  . DULoxetine  60 mg Oral Daily  . enoxaparin (LOVENOX) injection  40 mg Subcutaneous Q24H  . fluticasone  1 spray Each Nare Daily  . fluticasone furoate-vilanterol  1 puff Inhalation Daily  . gabapentin  600 mg Oral TID  . Influenza vac split quadrivalent PF  0.5 mL Intramuscular Tomorrow-1000  . loratadine  10 mg Oral Daily  . mirtazapine  30 mg Oral QHS  . montelukast  10 mg Oral Daily  . multivitamin with minerals  1 tablet Oral Daily  . nicotine  21 mg Transdermal Daily  . pantoprazole  40 mg Oral Daily  . traMADol  50 mg Oral QHS   Continuous Infusions:  PRN Meds: acetaminophen, albuterol, diclofenac, gi cocktail, hydrOXYzine, lactulose, morphine injection, nitroGLYCERIN, ondansetron (ZOFRAN) IV, polyethylene glycol, tiZANidine   Vital Signs    Vitals:   07/07/18 0857 07/07/18 1435 07/07/18 2006 07/08/18 0542  BP: 137/87 (!) 149/81 93/70 120/62  Pulse:  96 (!) 121 81  Resp:  16 18 20   Temp:  97.6 F (36.4 C) 98.1 F (36.7 C) 98.2 F (36.8 C)  TempSrc:  Oral  Oral  SpO2:  100% 96% 95%  Weight:      Height:        Intake/Output Summary (Last 24 hours) at 07/08/2018 1219 Last data filed at 07/07/2018 1600 Gross per 24 hour  Intake -  Output 522 ml  Net -522 ml   Filed Weights   07/06/18 1753 07/07/18 0013  Weight: 54.4 kg 52.6 kg    Telemetry    NSR - Personally Reviewed  ECG    Sinus tach , rate 101, axis WNL, intervals WNL, baseline artifact precludes adequate analysis.  No obvious acute ST T wave changes.   - Personally Reviewed  Physical Exam   GEN:  No acute distress.   Neck:  No JVD Cardiac: RRR, no  murmurs, rubs, or gallops.  Respiratory:  Clear to  auscultation bilaterally. GI: Soft, nontender, non-distended  MS: No edema; No deformity. Neuro:   Nonfocal  Psych: Normal affect   Labs    Chemistry Recent Labs  Lab 07/06/18 1857 07/07/18 0040 07/07/18 1813 07/08/18 0533  NA 147*  --   --  145  K 3.2*  --   --  4.6  CL 109  --   --  109  CO2 29  --   --  28  GLUCOSE 97  --   --  115*  BUN 6*  --   --  8  CREATININE 1.00 0.93 0.97 0.91  CALCIUM 9.7  --   --  8.8*  GFRNONAA 59* >60 >60 >60  GFRAA >60 >60 >60 >60  ANIONGAP 9  --   --  8     Hematology Recent Labs  Lab 07/06/18 1857 07/07/18 0040 07/07/18 1813  WBC 7.4 7.0 9.1  RBC 4.19 3.52* 4.10  HGB 12.3 10.5* 12.3  HCT 36.9 31.0* 36.5  MCV 88.1 88.1 89.0  MCH 29.4 29.8 30.0  MCHC 33.3 33.9 33.7  RDW 15.9* 15.9* 16.2*  PLT 301 273 281    Cardiac Enzymes Recent Labs  Lab 07/07/18  0040 07/07/18 0312 07/07/18 0611 07/07/18 1813  TROPONINI <0.03 <0.03 <0.03 <0.03   No results for input(s): TROPIPOC in the last 168 hours.   BNPNo results for input(s): BNP, PROBNP in the last 168 hours.   DDimer No results for input(s): DDIMER in the last 168 hours.   Radiology    Dg Chest 2 View  Result Date: 07/06/2018 CLINICAL DATA:  Chest pain. EXAM: CHEST - 2 VIEW COMPARISON:  Radiographs of June 21, 2018. FINDINGS: The heart size and mediastinal contours are within normal limits. Both lungs are clear. No pneumothorax or pleural effusion is noted. The visualized skeletal structures are unremarkable. IMPRESSION: No active cardiopulmonary disease. Electronically Signed   By: Lupita Raider, M.D.   On: 07/06/2018 19:03   Ct Angio Chest Pe W And/or Wo Contrast  Result Date: 07/06/2018 CLINICAL DATA:  62 year old female with shortness of breath. EXAM: CT ANGIOGRAPHY CHEST WITH CONTRAST TECHNIQUE: Multidetector CT imaging of the chest was performed using the standard protocol during bolus administration of intravenous contrast. Multiplanar CT image reconstructions and  MIPs were obtained to evaluate the vascular anatomy. CONTRAST:  ISOVUE-370 IOPAMIDOL (ISOVUE-370) INJECTION 76% COMPARISON:  Chest radiograph dated 07/06/2018 FINDINGS: Cardiovascular: There is no cardiomegaly or pericardial effusion. Coronary vascular calcification of the LAD. Mild atherosclerotic calcification of the aortic arch. There is no CT evidence of pulmonary embolism. Mediastinum/Nodes: No hilar or mediastinal adenopathy. Small hiatal hernia. The esophagus and the thyroid gland are grossly unremarkable. No mediastinal fluid collection. Lungs/Pleura: There is emphysematous changes of the lungs. No focal consolidation, pleural effusion, or pneumothorax. The central airways are patent. Upper Abdomen: Left adrenal thickening/hyperplasia versus adenoma. An 8 mm left renal interpolar nonobstructing calculus. Musculoskeletal: No chest wall abnormality. No acute or significant osseous findings. Review of the MIP images confirms the above findings. IMPRESSION: No acute intrathoracic pathology. No CT evidence of pulmonary embolism. Electronically Signed   By: Elgie Collard M.D.   On: 07/06/2018 21:50   Dg Chest Port 1 View  Result Date: 07/07/2018 CLINICAL DATA:  Chest pain, hypoxia EXAM: PORTABLE CHEST 1 VIEW COMPARISON:  07/06/2018 FINDINGS: Heart and mediastinal contours are within normal limits. No focal opacities or effusions. No acute bony abnormality. IMPRESSION: No active disease. Electronically Signed   By: Charlett Nose M.D.   On: 07/07/2018 18:28   Dg Abd Portable 1v  Result Date: 07/07/2018 CLINICAL DATA:  Constipation EXAM: PORTABLE ABDOMEN - 1 VIEW COMPARISON:  None. FINDINGS: The bowel gas pattern is normal. Moderate bowel content is identified throughout colon. There is no bowel obstruction. Residual contrast is identified in the bladder. There is scoliosis of spine. IMPRESSION: Moderate bowel content identified throughout colon. No bowel obstruction identified. Electronically Signed    By: Sherian Rein M.D.   On: 07/07/2018 11:26    Cardiac Studies   NA  Patient Profile     62 y.o. female presented with chest pain, constipation.  Pain is atypical without evidence of ischemia.  She has had a negative work up in the past.  HEART Score was 2.    Assessment & Plan    Chest pain:  Events noted with pain last night at the time of a BM.  No EKG changes.  Repeat enzyme negative.  She has a perfusion study scheduled for next week and then a follow up office visit.  [facility] will sign off.   Medication Recommendations:  None Other recommendations (labs, testing, etc):  See scheduled studies Follow up as an  outpatient:   As listed in Epic.  She has follow up [employer] and office visit    Signed, Rollene Rotunda, MD  07/08/2018, 12:19 PM

## 2018-07-08 NOTE — Progress Notes (Signed)
Physical Therapy Treatment Patient Details Name: Tammy Schwartz MRN: [id number] DOB: Aug 15, 1956 Today's Date: 07/08/2018    History of Present Illness Pt admitted to ED on 9/25 for mild chest pain, difficulty swallowing. Pt with recent ED visit 9/9 for fatigue, body aches; admitted August for foot cellulitis. PMH includes COPD, tobacco use, HTN, HLD, chronic back pain, PNA, lumbar decompression and laminectomy surgeries.     PT Comments    Pt tolerated ambulation well today, still presenting with variable gait parameters throughout session. Pt reported heartburn-like symptoms after increased velocity ambulation. Subsided with rest. Pt with fatigue after session. PT to continue to progress mobility as able.    Follow Up Recommendations  Outpatient PT     Equipment Recommendations  None recommended by PT    Recommendations for Other Services       Precautions / Restrictions Precautions Precautions: Fall Restrictions Weight Bearing Restrictions: No    Mobility  Bed Mobility               General bed mobility comments: Pt up in chair upon PT arrival to room   Transfers Overall transfer level: Modified independent Equipment used: None Transfers: Sit to/from Stand           General transfer comment: increased effort to stand, pt with self-steadying upon standing.   Ambulation/Gait Ambulation/Gait assistance: Supervision;Modified independent (Device/Increase time) Gait Distance (Feet): 350 Feet Assistive device: None Gait Pattern/deviations: Step-through pattern;Wide base of support;Drifts right/left;Narrow base of support Gait velocity: decr    General Gait Details: As per yesterday's note, variable step length and step width, at times ataxic in appearance. PT challenged pt's gait with increasing gait speed. Pt stated she was having chest pains, standing rest break after 200 ft ambulation, HR 108 and SPO2 96%. RN notified, pt stating it feels like  burning/heartburn.    Stairs             Wheelchair Mobility    Modified Rankin (Stroke Patients Only)       Balance Overall balance assessment: Needs assistance;History of Falls   Sitting balance-Leahy Scale: Good       Standing balance-Leahy Scale: Fair Standing balance comment: no AD required, but unable to accept challenge                             Cognition Arousal/Alertness: Awake/alert Behavior During Therapy: WFL for tasks assessed/performed Overall Cognitive Status: Within Functional Limits for tasks assessed                                        Exercises      General Comments        Pertinent Vitals/Pain Pain Assessment: Faces Faces Pain Scale: Hurts little more Pain Location: chest, with mobility  Pain Descriptors / Indicators: Tingling;Burning Pain Intervention(s): Limited activity within patient's tolerance;Monitored during session    Home Living                      Prior Function            PT Goals (current goals can now be found in the care plan section) Acute Rehab PT Goals PT Goal Formulation: With patient Time For Goal Achievement: 07/21/18 Potential to Achieve Goals: Good Progress towards PT goals: Progressing toward goals    Frequency    Min  3X/week      PT Plan Current plan remains appropriate    Co-evaluation              AM-PAC PT "6 Clicks" Daily Activity  Outcome Measure  Difficulty turning over in bed (including adjusting bedclothes, sheets and blankets)?: None Difficulty moving from lying on back to sitting on the side of the bed? : None Difficulty sitting down on and standing up from a chair with arms (e.g., wheelchair, bedside commode, etc,.)?: None Help needed moving to and from a bed to chair (including a wheelchair)?: None Help needed walking in hospital room?: A Little Help needed climbing 3-5 steps with a railing? : A Little 6 Click Score: 22    End  of Session Equipment Utilized During Treatment: Gait belt Activity Tolerance: Patient tolerated treatment well;Patient limited by fatigue Patient left: in chair;with call bell/phone within reach;with family/visitor present Nurse Communication: Mobility status PT Visit Diagnosis: Unsteadiness on feet (R26.81);History of falling (Z91.81)     Time: 1610-9604 PT Time Calculation (min) (ACUTE ONLY): 17 min  Charges:  $Gait Training: 8-22 mins                     Nicola Police, PT Acute Rehabilitation Services Pager [phone]  Office [phone]   Tyrone Apple D Despina Hidden 07/08/2018, 2:33 PM

## 2018-07-08 NOTE — Care Management Note (Signed)
Case Management Note  Patient Details  Name: Tammy Schwartz MRN: [id number] Date of Birth: 17-Jul-1956  Subjective/Objective: PT-recc otpt PT-provided patient w/otpt PT resource list. Will need script for otpt PT-MD aware. Informed nurse of patient stating when she wiped while in bathroom there was blood from her rectum. No further CM needs.                  Action/Plan:dc home w/otpt PT.   Expected Discharge Date:                  Expected Discharge Plan:  OP Rehab  In-House Referral:     Discharge planning Services  CM Consult  Post Acute Care Choice:    Choice offered to:     DME Arranged:    DME Agency:     HH Arranged:    HH Agency:     Status of Service:  Completed, signed off  If discussed at [employer] of Stay Meetings, dates discussed:    Additional Comments:  Lanier Clam, RN 07/08/2018, 12:32 PM

## 2018-07-09 ENCOUNTER — Telehealth (HOSPITAL_COMMUNITY): Payer: Self-pay

## 2018-07-09 NOTE — Telephone Encounter (Signed)
Encounter complete. 

## 2018-07-12 ENCOUNTER — Telehealth: Payer: Self-pay | Admitting: Family Medicine

## 2018-07-12 NOTE — Telephone Encounter (Addendum)
Patient called was upset there were no HFU with her provided however, I was able to follow up with her for a sooner appt due to a cancelation.

## 2018-07-13 ENCOUNTER — Ambulatory Visit (HOSPITAL_COMMUNITY)
Admit: 2018-07-13 | Payer: No Typology Code available for payment source | Attending: Physician Assistant | Admitting: Physician Assistant

## 2018-07-14 ENCOUNTER — Ambulatory Visit: Payer: Self-pay | Attending: Family Medicine | Admitting: Family Medicine

## 2018-07-14 ENCOUNTER — Encounter: Payer: Self-pay | Admitting: Family Medicine

## 2018-07-14 VITALS — BP 135/80 | HR 94 | Temp 97.6°F | Ht 61.0 in | Wt 115.8 lb

## 2018-07-14 DIAGNOSIS — Z79899 Other long term (current) drug therapy: Secondary | ICD-10-CM | POA: Insufficient documentation

## 2018-07-14 DIAGNOSIS — I10 Essential (primary) hypertension: Secondary | ICD-10-CM | POA: Insufficient documentation

## 2018-07-14 DIAGNOSIS — R42 Dizziness and giddiness: Secondary | ICD-10-CM | POA: Insufficient documentation

## 2018-07-14 DIAGNOSIS — M961 Postlaminectomy syndrome, not elsewhere classified: Secondary | ICD-10-CM | POA: Insufficient documentation

## 2018-07-14 DIAGNOSIS — Z7982 Long term (current) use of aspirin: Secondary | ICD-10-CM | POA: Insufficient documentation

## 2018-07-14 DIAGNOSIS — J449 Chronic obstructive pulmonary disease, unspecified: Secondary | ICD-10-CM | POA: Insufficient documentation

## 2018-07-14 DIAGNOSIS — Z9889 Other specified postprocedural states: Secondary | ICD-10-CM | POA: Insufficient documentation

## 2018-07-14 DIAGNOSIS — G4700 Insomnia, unspecified: Secondary | ICD-10-CM | POA: Insufficient documentation

## 2018-07-14 DIAGNOSIS — D573 Sickle-cell trait: Secondary | ICD-10-CM | POA: Insufficient documentation

## 2018-07-14 DIAGNOSIS — Z79891 Long term (current) use of opiate analgesic: Secondary | ICD-10-CM | POA: Insufficient documentation

## 2018-07-14 DIAGNOSIS — G4709 Other insomnia: Secondary | ICD-10-CM

## 2018-07-14 DIAGNOSIS — E785 Hyperlipidemia, unspecified: Secondary | ICD-10-CM | POA: Insufficient documentation

## 2018-07-14 DIAGNOSIS — J431 Panlobular emphysema: Secondary | ICD-10-CM | POA: Insufficient documentation

## 2018-07-14 DIAGNOSIS — L299 Pruritus, unspecified: Secondary | ICD-10-CM | POA: Insufficient documentation

## 2018-07-14 DIAGNOSIS — R630 Anorexia: Secondary | ICD-10-CM

## 2018-07-14 DIAGNOSIS — M5416 Radiculopathy, lumbar region: Secondary | ICD-10-CM | POA: Insufficient documentation

## 2018-07-14 DIAGNOSIS — R110 Nausea: Secondary | ICD-10-CM | POA: Insufficient documentation

## 2018-07-14 MED ORDER — GABAPENTIN 300 MG PO CAPS: 600.0000 mg | ORAL_CAPSULE | Freq: Three times a day (TID) | ORAL | 3 refills | Status: DC

## 2018-07-14 MED ORDER — MECLIZINE HCL 25 MG PO TABS: 25.0000 mg | ORAL_TABLET | Freq: Three times a day (TID) | ORAL | 1 refills | Status: DC | PRN

## 2018-07-14 MED ORDER — TIZANIDINE HCL 4 MG PO TABS: 4.0000 mg | ORAL_TABLET | Freq: Three times a day (TID) | ORAL | 3 refills | Status: DC | PRN

## 2018-07-14 MED ORDER — DULOXETINE HCL 60 MG PO CPEP: 60.0000 mg | ORAL_CAPSULE | Freq: Every day | ORAL | 3 refills | Status: DC

## 2018-07-14 MED ORDER — MIRTAZAPINE 30 MG PO TABS: 30.0000 mg | ORAL_TABLET | Freq: Every day | ORAL | 3 refills | Status: DC

## 2018-07-14 MED ORDER — HYDROXYZINE HCL 25 MG PO TABS: 25.0000 mg | ORAL_TABLET | Freq: Three times a day (TID) | ORAL | 2 refills | Status: DC | PRN

## 2018-07-14 MED FILL — ?MIRTAZAPINE 30 MG TABLET: 30 | 30 days supply | Qty: 30 | Fill #0

## 2018-07-14 MED FILL — tiZANidine HCL 4 MG TABS: 4 | 30 days supply | Qty: 90 | Fill #0

## 2018-07-14 MED FILL — ?MECLIZINE HCL 25MG TABS: 25 | 20 days supply | Qty: 60 | Fill #0

## 2018-07-14 MED FILL — ?DULoxetine HCL 60MG CPEP: 60 | 30 days supply | Qty: 30 | Fill #0

## 2018-07-14 MED FILL — hydrOXYzine HCL 25 MG TABS: 25 | 30 days supply | Qty: 90 | Fill #0

## 2018-07-14 NOTE — Patient Instructions (Signed)

## 2018-07-15 ENCOUNTER — Encounter: Payer: Self-pay | Admitting: Family Medicine

## 2018-07-15 NOTE — Progress Notes (Signed)
Subjective:  Patient ID: Tammy Schwartz, female    DOB: 1956/08/09  Age: 62 y.o. MRN: [id number]  CC: Hospitalization Follow-up   HPI Tammy Schwartz is a 62 year old female with history of hypertension, tobacco abuse, COPD, lumbar spondylosis status post back surgery who presents today for a follow-up visit. She had a hospitalization at [facility] from 07/06/18-07/08/18 for chest pains. Troponins were negative, EKG revealed no ST changes, CTA of the chest was negative for PE. She was scheduled for an outpatient  nuclear stress test which was cancelled; symptoms improved with GI cocktail. She was commenced on a PPI as symptoms were thought to be of GI origin. She has an upcoming appointment with Cardiology on 07/19/18.  She presents today denying chest pain but is concerned about her persistent nausea, reduced appetite and weight loss (lost 7lbs in the last 3 months). She has also felt lightheaded but denies tinnitus or hearing loss.She has been taking her PPI for the last few days. She denies constipation or diarrhea. Her last Colonoscopy was by GI on [address] and she states it was normal but we do not have the records available to [place]. Mammogram from 12/2017 was negative for malignancy, PAP smear from 07/2017 was negative for malignancy.  Past Medical History:  Diagnosis Date  . Chronic back pain    HNp,spondylosis,radiculopathy  . COPD (chronic obstructive pulmonary disease) (HCC)   . Hyperlipidemia    was on choloesterol meds last yr-samples given in office but not needed since  . Hypertension    takes Exforge daily  . Joint pain   . Shortness of breath    takes Singulair daily;with exertion  . Sickle cell trait (HCC)   . Spondylolysis   . Tobacco use     Past Surgical History:  Procedure Laterality Date  . BACK SURGERY    . BREAST EXCISIONAL BIOPSY Left 2007   benign  . BREAST SURGERY Left    lumpectomy  . COLONOSCOPY    . DILATION AND CURETTAGE OF  UTERUS    . LUMBAR LAMINECTOMY/DECOMPRESSION MICRODISCECTOMY  06/04/2012   Procedure: LUMBAR LAMINECTOMY/DECOMPRESSION MICRODISCECTOMY 1 LEVEL;  Surgeon: Carmela Hurt, MD;  Location: [facility];  Service: Neurosurgery;  Laterality: Left;  LEFT Lumbar five-sacral one diskectomy  . LUMBAR LAMINECTOMY/DECOMPRESSION MICRODISCECTOMY Right 06/16/2013   Procedure: RIGHT Lumbar Five-Sacral One Microdiskectomy;  Surgeon: Carmela Hurt, MD;  Location: [facility];  Service: Neurosurgery;  Laterality: Right;  RIGHT Lumbar Five-Sacral One Microdiskectomy  . LUMBAR LAMINECTOMY/DECOMPRESSION MICRODISCECTOMY Right 01/20/2014   Procedure: LUMBAR FOUR TO LUMBAR FIVE LUMBAR LAMINECTOMY/DECOMPRESSION MICRODISCECTOMY 1 LEVEL;  Surgeon: Carmela Hurt, MD;  Location: [facility];  Service: Neurosurgery;  Laterality: Right;  Right L45 laminectomy and foramintomy    No Known Allergies   Outpatient Medications Prior to Visit  Medication Sig Dispense Refill  . aspirin EC 81 MG tablet Take 162 mg by mouth daily.    . cetirizine (ZYRTEC) 10 MG tablet Take 1 tablet (10 mg total) by mouth daily. 30 tablet 11  . diclofenac (VOLTAREN) 75 MG EC tablet Take 1 tablet (75 mg total) by mouth 2 (two) times daily. Prn pain (Patient taking differently: Take 75 mg by mouth 2 (two) times daily as needed for mild pain. ) 60 tablet 3  . fluticasone (FLONASE) 50 MCG/ACT nasal spray Place 1 spray into both nostrils daily. 16 g 0  . fluticasone furoate-vilanterol (BREO ELLIPTA) 100-25 MCG/INH AEPB Inhale 1 puff into the lungs  daily. 90 each 3  . lactulose (CHRONULAC) 10 GM/15ML solution Take 15 mLs (10 g total) by mouth 2 (two) times daily as needed for mild constipation. 946 mL 0  . lisinopril-hydrochlorothiazide (ZESTORETIC) 20-12.5 MG tablet Take 2 tablets by mouth daily. 60 tablet 6  . montelukast (SINGULAIR) 10 MG tablet Take 10 mg by mouth daily.     . Multiple Vitamin (MULTIVITAMIN WITH MINERALS) TABS Take 1 tablet by mouth  daily.    . pantoprazole (PROTONIX) 40 MG tablet Take 1 tablet (40 mg total) by mouth daily. 30 tablet 0  . polyethylene glycol powder (GLYCOLAX/MIRALAX) powder Take 17 g by mouth daily. (Patient taking differently: Take 17 g by mouth daily as needed for mild constipation. ) 3350 g 1  . senna-docusate (SENOKOT-S) 8.6-50 MG tablet Take 2 tablets by mouth at bedtime. 60 tablet 0  . traMADol (ULTRAM) 50 MG tablet Take 1 tablet (50 mg total) by mouth at bedtime. 30 tablet 1  . VENTOLIN HFA 108 (90 Base) MCG/ACT inhaler Inhale 2 puffs into the lungs every 6 (six) hours as needed. 54 g 3  . DULoxetine (CYMBALTA) 60 MG capsule Take 1 capsule (60 mg total) by mouth daily. 30 capsule 3  . gabapentin (NEURONTIN) 300 MG capsule Take 2 capsules (600 mg total) by mouth 3 (three) times daily. 180 capsule 3  . hydrOXYzine (ATARAX/VISTARIL) 25 MG tablet Take 1 tablet (25 mg total) by mouth every 8 (eight) hours as needed. 90 tablet 2  . mirtazapine (REMERON) 30 MG tablet Take 1 tablet (30 mg total) by mouth at bedtime. 30 tablet 3  . tiZANidine (ZANAFLEX) 4 MG tablet Take 1 tablet (4 mg total) by mouth every 8 (eight) hours as needed for muscle spasms. 90 tablet 3  . acetaminophen (TYLENOL) 325 MG tablet Take 2 tablets (650 mg total) by mouth every 6 (six) hours as needed. (Patient not taking: Reported on 07/06/2018) 30 tablet 0   No facility-administered medications prior to visit.     ROS Review of Systems  Constitutional: Negative for activity change, appetite change and fatigue.  HENT: Negative for congestion, sinus pressure and sore throat.   Eyes: Negative for visual disturbance.  Respiratory: Negative for cough, chest tightness, shortness of breath and wheezing.   Cardiovascular: Negative for chest pain and palpitations.  Gastrointestinal: Positive for nausea. Negative for abdominal distention, abdominal pain and constipation.  Endocrine: Negative for polydipsia.  Genitourinary: Negative for dysuria  and frequency.  Musculoskeletal: Negative for arthralgias and back pain.  Skin: Negative for rash.  Neurological: Negative for tremors, positive for light-headedness and negative for numbness.  Hematological: Does not bruise/bleed easily.  Psychiatric/Behavioral: Negative for agitation and behavioral problems.    Objective:  BP 135/80   Pulse 94   Temp 97.6 F (36.4 C) (Oral)   Ht 5\' 1"  (1.549 m)   Wt 115 lb 12.8 oz (52.5 kg)   LMP 03/17/2012   SpO2 96%   BMI 21.88 kg/m   BP/Weight 07/14/2018 07/08/2018 07/07/2018  Systolic BP 135 120 -  Diastolic BP 80 62 -  Wt. (Lbs) 115.8 - 115.96  BMI 21.88 - 21.91      Physical Exam  Constitutional: She is oriented to person, place, and time. She appears well-developed and well-nourished.  Cardiovascular: Normal rate, normal heart sounds and intact distal pulses.  No murmur heard. Pulmonary/Chest: Effort normal and breath sounds normal. She has no wheezes. She has no rales. She exhibits no tenderness.  Abdominal: Soft. Bowel sounds  are normal. She exhibits no distension and no mass. There is no tenderness.  Musculoskeletal: Normal range of motion.  Neurological: She is alert and oriented to person, place, and time.  Skin: Skin is warm and dry.  Psychiatric: She has a normal mood and affect.     Assessment & Plan:   1. Lumbar radiculopathy Stable - tiZANidine (ZANAFLEX) 4 MG tablet; Take 1 tablet (4 mg total) by mouth every 8 (eight) hours as needed for muscle spasms.  Dispense: 90 tablet; Refill: 3 - gabapentin (NEURONTIN) 300 MG capsule; Take 2 capsules (600 mg total) by mouth 3 (three) times daily.  Dispense: 180 capsule; Refill: 3 - DULoxetine (CYMBALTA) 60 MG capsule; Take 1 capsule (60 mg total) by mouth daily.  Dispense: 30 capsule; Refill: 3  2. Other insomnia Stable - mirtazapine (REMERON) 30 MG tablet; Take 1 tablet (30 mg total) by mouth at bedtime.  Dispense: 30 tablet; Refill: 3  3. Pruritus Stable - hydrOXYzine  (ATARAX/VISTARIL) 25 MG tablet; Take 1 tablet (25 mg total) by mouth every 8 (eight) hours as needed.  Dispense: 90 tablet; Refill: 2  4. Panlobular emphysema (HCC) Stable Continue Breo and Ventolin HFA  5. Nausea Currently on Protonix which have advised her to hold off on for 2 weeks after which we will perform H. pylori breath test If symptoms persistent but this is negative I will refer to GI for upper endoscopy - H. pylori breath test; Future  6. Poor appetite Could be secondary to nausea, will need to exclude H. pylori antibody Continue Remeron  7. Dizziness She does have dizziness and nausea but no hearing loss; low suspicion for Mnire's disease We will treat as vertigo Negative orthostatics - meclizine (ANTIVERT) 25 MG tablet; Take 1 tablet (25 mg total) by mouth 3 (three) times daily as needed for dizziness.  Dispense: 60 tablet; Refill: 1   Meds ordered this encounter  Medications  . meclizine (ANTIVERT) 25 MG tablet    Sig: Take 1 tablet (25 mg total) by mouth 3 (three) times daily as needed for dizziness.    Dispense:  60 tablet    Refill:  1  . tiZANidine (ZANAFLEX) 4 MG tablet    Sig: Take 1 tablet (4 mg total) by mouth every 8 (eight) hours as needed for muscle spasms.    Dispense:  90 tablet    Refill:  3  . mirtazapine (REMERON) 30 MG tablet    Sig: Take 1 tablet (30 mg total) by mouth at bedtime.    Dispense:  30 tablet    Refill:  3    Discontinue previous dose  . hydrOXYzine (ATARAX/VISTARIL) 25 MG tablet    Sig: Take 1 tablet (25 mg total) by mouth every 8 (eight) hours as needed.    Dispense:  90 tablet    Refill:  2  . gabapentin (NEURONTIN) 300 MG capsule    Sig: Take 2 capsules (600 mg total) by mouth 3 (three) times daily.    Dispense:  180 capsule    Refill:  3  . DULoxetine (CYMBALTA) 60 MG capsule    Sig: Take 1 capsule (60 mg total) by mouth daily.    Dispense:  30 capsule    Refill:  3    Follow-up: Return in about 2 weeks (around  07/28/2018) for nurse visit helicobacter pylori breath test; 1 month with PCP follow up.   Hoy Register MD

## 2018-07-19 ENCOUNTER — Ambulatory Visit: Payer: No Typology Code available for payment source | Admitting: Physician Assistant

## 2018-07-19 NOTE — Progress Notes (Deleted)
Cardiology Office Note    Date:  07/19/2018   ID:  Tammy Schwartz, DOB 10-Oct-1956, MRN [id number]  PCP:  Hoy Register, MD  Cardiologist:  Dr. Royann Shivers  No chief complaint on file.   History of Present Illness:  Tammy Schwartz is a 62 y.o. female with PMH of COPD, HLD, HTN, sickle cell trait, and chronic back pain.  Patient had a chest pain evaluation by Dr. Royann Shivers in the hospital in 2017.  Echocardiogram at the time showed normal EF, grade 2 DD.  Lexiscan Myoview was negative.  Patient presented to the hospital again in late September 2019 with recurrent chest pain.  Symptoms improved with GI cocktail.  CT was negative for PE.  Enzymes negative x6.  Given negative EKG changes and negative work-up, patient was discharged to have outpatient stress test.  Since discharge, patient has canceled the scheduled stress test.  She was seen by her primary care provider on 07/14/2018, she was having persistent nausea, reduced appetite and weight loss.  She also complained of lightheadedness as well.  She was placed on meclizine and had H. pylori test ordered.  No EKG if no recurrent chest pain since hospitalization   Past Medical History:  Diagnosis Date  . Chronic back pain    HNp,spondylosis,radiculopathy  . COPD (chronic obstructive pulmonary disease) (HCC)   . Hyperlipidemia    was on choloesterol meds last yr-samples given in office but not needed since  . Hypertension    takes Exforge daily  . Joint pain   . Shortness of breath    takes Singulair daily;with exertion  . Sickle cell trait (HCC)   . Spondylolysis   . Tobacco use     Past Surgical History:  Procedure Laterality Date  . BACK SURGERY    . BREAST EXCISIONAL BIOPSY Left 2007   benign  . BREAST SURGERY Left    lumpectomy  . COLONOSCOPY    . DILATION AND CURETTAGE OF UTERUS    . LUMBAR LAMINECTOMY/DECOMPRESSION MICRODISCECTOMY  06/04/2012   Procedure: LUMBAR LAMINECTOMY/DECOMPRESSION MICRODISCECTOMY 1 LEVEL;   Surgeon: Carmela Hurt, MD;  Location: [facility];  Service: Neurosurgery;  Laterality: Left;  LEFT Lumbar five-sacral one diskectomy  . LUMBAR LAMINECTOMY/DECOMPRESSION MICRODISCECTOMY Right 06/16/2013   Procedure: RIGHT Lumbar Five-Sacral One Microdiskectomy;  Surgeon: Carmela Hurt, MD;  Location: [facility];  Service: Neurosurgery;  Laterality: Right;  RIGHT Lumbar Five-Sacral One Microdiskectomy  . LUMBAR LAMINECTOMY/DECOMPRESSION MICRODISCECTOMY Right 01/20/2014   Procedure: LUMBAR FOUR TO LUMBAR FIVE LUMBAR LAMINECTOMY/DECOMPRESSION MICRODISCECTOMY 1 LEVEL;  Surgeon: Carmela Hurt, MD;  Location: [facility];  Service: Neurosurgery;  Laterality: Right;  Right L45 laminectomy and foramintomy    Current Medications: Outpatient Medications Prior to Visit  Medication Sig Dispense Refill  . acetaminophen (TYLENOL) 325 MG tablet Take 2 tablets (650 mg total) by mouth every 6 (six) hours as needed. (Patient not taking: Reported on 07/06/2018) 30 tablet 0  . aspirin EC 81 MG tablet Take 162 mg by mouth daily.    . cetirizine (ZYRTEC) 10 MG tablet Take 1 tablet (10 mg total) by mouth daily. 30 tablet 11  . diclofenac (VOLTAREN) 75 MG EC tablet Take 1 tablet (75 mg total) by mouth 2 (two) times daily. Prn pain (Patient taking differently: Take 75 mg by mouth 2 (two) times daily as needed for mild pain. ) 60 tablet 3  . DULoxetine (CYMBALTA) 60 MG capsule Take 1 capsule (60 mg total) by mouth daily. 30  capsule 3  . fluticasone (FLONASE) 50 MCG/ACT nasal spray Place 1 spray into both nostrils daily. 16 g 0  . fluticasone furoate-vilanterol (BREO ELLIPTA) 100-25 MCG/INH AEPB Inhale 1 puff into the lungs daily. 90 each 3  . gabapentin (NEURONTIN) 300 MG capsule Take 2 capsules (600 mg total) by mouth 3 (three) times daily. 180 capsule 3  . hydrOXYzine (ATARAX/VISTARIL) 25 MG tablet Take 1 tablet (25 mg total) by mouth every 8 (eight) hours as needed. 90 tablet 2  . lactulose (CHRONULAC) 10 GM/15ML  solution Take 15 mLs (10 g total) by mouth 2 (two) times daily as needed for mild constipation. 946 mL 0  . lisinopril-hydrochlorothiazide (ZESTORETIC) 20-12.5 MG tablet Take 2 tablets by mouth daily. 60 tablet 6  . meclizine (ANTIVERT) 25 MG tablet Take 1 tablet (25 mg total) by mouth 3 (three) times daily as needed for dizziness. 60 tablet 1  . mirtazapine (REMERON) 30 MG tablet Take 1 tablet (30 mg total) by mouth at bedtime. 30 tablet 3  . montelukast (SINGULAIR) 10 MG tablet Take 10 mg by mouth daily.     . Multiple Vitamin (MULTIVITAMIN WITH MINERALS) TABS Take 1 tablet by mouth daily.    . pantoprazole (PROTONIX) 40 MG tablet Take 1 tablet (40 mg total) by mouth daily. 30 tablet 0  . polyethylene glycol powder (GLYCOLAX/MIRALAX) powder Take 17 g by mouth daily. (Patient taking differently: Take 17 g by mouth daily as needed for mild constipation. ) 3350 g 1  . senna-docusate (SENOKOT-S) 8.6-50 MG tablet Take 2 tablets by mouth at bedtime. 60 tablet 0  . tiZANidine (ZANAFLEX) 4 MG tablet Take 1 tablet (4 mg total) by mouth every 8 (eight) hours as needed for muscle spasms. 90 tablet 3  . traMADol (ULTRAM) 50 MG tablet Take 1 tablet (50 mg total) by mouth at bedtime. 30 tablet 1  . VENTOLIN HFA 108 (90 Base) MCG/ACT inhaler Inhale 2 puffs into the lungs every 6 (six) hours as needed. 54 g 3   No facility-administered medications prior to visit.      Allergies:   Patient has no known allergies.   Social History   Socioeconomic History  . Marital status: Married    Spouse name: Not on file  . Number of children: Not on file  . Years of education: Not on file  . Highest education level: Not on file  Occupational History  . Not on file  Social Needs  . Financial resource strain: Not on file  . Food insecurity:    Worry: Not on file    Inability: Not on file  . Transportation needs:    Medical: Not on file    Non-medical: Not on file  Tobacco Use  . Smoking status: Current Every  Day Smoker    Packs/day: 0.25    Years: 20.00    Pack years: 5.00    Types: Cigarettes  . Smokeless tobacco: Never Used  Substance and Sexual Activity  . Alcohol use: No  . Drug use: No  . Sexual activity: Yes    Birth control/protection: Post-menopausal  Lifestyle  . Physical activity:    Days per week: 0 days    Minutes per session: 0 min  . Stress: Only a little  Relationships  . Social connections:    Talks on phone: More than three times a week    Gets together: Once a week    Attends religious service: More than 4 times per year  Active member of club or organization: No    Attends meetings of clubs or organizations: Never    Relationship status: Married  Other Topics Concern  . Not on file  Social History Narrative   Lives with husband and son.      Family History:  The patient's ***family history includes CAD in her father; Cancer - Other in her mother; Hypertension in her sister.   ROS:   Please see the history of present illness.    ROS All other systems reviewed and are negative.   PHYSICAL EXAM:   VS:  LMP 03/17/2012    GEN: Well nourished, well developed, in no acute distress  HEENT: normal  Neck: no JVD, carotid bruits, or masses Cardiac: ***RRR; no murmurs, rubs, or gallops,no edema  Respiratory:  clear to auscultation bilaterally, normal work of breathing GI: soft, nontender, nondistended, + BS MS: no deformity or atrophy  Skin: warm and dry, no rash Neuro:  Alert and Oriented x 3, Strength and sensation are intact Psych: euthymic mood, full affect  Wt Readings from Last 3 Encounters:  07/14/18 115 lb 12.8 oz (52.5 kg)  07/07/18 115 lb 15.4 oz (52.6 kg)  06/26/18 114 lb (51.7 kg)      Studies/Labs Reviewed:   EKG:  EKG is*** ordered today.  The ekg ordered today demonstrates ***  Recent Labs: 06/21/2018: ALT 21; TSH 1.706 07/07/2018: Hemoglobin 12.3; Platelets 281 07/08/2018: BUN 8; Creatinine, Ser 0.91; Potassium 4.6; Sodium 145   Lipid  Panel    Component Value Date/Time   CHOL 204 (H) 01/01/2018 1223   TRIG 92 01/01/2018 1223   HDL 102 01/01/2018 1223   CHOLHDL 2.0 01/01/2018 1223   CHOLHDL 2.3 05/07/2016 0739   VLDL 20 05/07/2016 0739   LDLCALC 84 01/01/2018 1223    Additional studies/ records that were reviewed today include:  ***    ASSESSMENT:    No diagnosis found.   PLAN:  In order of problems listed above:  1. ***    Medication Adjustments/Labs and Tests Ordered: Current medicines are reviewed at length with the patient today.  Concerns regarding medicines are outlined above.  Medication changes, Labs and Tests ordered today are listed in the Patient Instructions below. There are no Patient Instructions on file for this visit.   Ramond Dial, [place]  07/19/2018 8:30 AM    [facility] [address] [place], [place], [place]  [address] Phone: [phone]; Fax: [phone]

## 2018-07-20 ENCOUNTER — Encounter: Payer: Self-pay | Admitting: *Deleted

## 2018-07-29 ENCOUNTER — Ambulatory Visit: Payer: Self-pay | Attending: Family Medicine | Admitting: *Deleted

## 2018-07-29 DIAGNOSIS — R110 Nausea: Secondary | ICD-10-CM

## 2018-07-29 NOTE — Progress Notes (Signed)
Pt arrived at nurse visit for H pylori breath test.  She has not taken ABX, PPI's or bismuth prep in last 2 weeks.   Test completed and given to the [employer] office in [facility].

## 2018-07-31 LAB — H. PYLORI BREATH TEST: H pylori Breath Test: NEGATIVE

## 2018-08-10 ENCOUNTER — Telehealth: Payer: Self-pay

## 2018-08-10 NOTE — Telephone Encounter (Signed)
-----   Message from Hoy Register, MD sent at 08/02/2018  1:42 PM EDT ----- H. pylori breath test is negative

## 2018-08-10 NOTE — Telephone Encounter (Signed)
Patient was called and informed of lab results. 

## 2018-08-12 ENCOUNTER — Telehealth: Payer: Self-pay | Admitting: Family Medicine

## 2018-08-12 NOTE — Telephone Encounter (Signed)
Dr.heisler was calling because she would like to know if the patient has had any blood work for thyroid. She says she saw evidence that thyroid was elevated and would like to discuss patient foundings. She has also stated that she is able to provide the patient's chart if PCP would like to evaluate. Please follow up with Dr.Heisler.

## 2018-08-13 NOTE — Telephone Encounter (Signed)
Dr. Daisey Must office returned phone call and she would like for patient to get an Orbital CT scan too make sure there is no inflammation. Dr. Daisey Must also states that she will fax over her office notes.

## 2018-08-13 NOTE — Telephone Encounter (Signed)
Patient has not had any thyroid labs. Does she need to have a lab visit?

## 2018-08-13 NOTE — Telephone Encounter (Signed)
TSH from last month 06/21/18 was normal. Please fax labs to Dr [facility] office. Thanks.

## 2018-08-16 NOTE — Telephone Encounter (Signed)
What would the diagnosis for the CT be and could you obtain more information about the indication? Evaluating for inflammation where? We need the specifics to order the right test and for insurance approval.What specialty is Dr Daisey Must? Thanks.

## 2018-08-20 ENCOUNTER — Other Ambulatory Visit: Payer: Self-pay | Admitting: Family Medicine

## 2018-08-20 DIAGNOSIS — H05822 Myopathy of extraocular muscles, left orbit: Secondary | ICD-10-CM

## 2018-08-20 NOTE — Progress Notes (Unsigned)
Could you please schedule  CT orbit for this patient as requested by ophthalmology?  Thank you

## 2018-08-23 ENCOUNTER — Other Ambulatory Visit: Payer: Self-pay | Admitting: Family Medicine

## 2018-08-23 DIAGNOSIS — J449 Chronic obstructive pulmonary disease, unspecified: Secondary | ICD-10-CM

## 2018-08-23 MED FILL — $VENTOLIN HFA 18G INHALER: 108 (90 BAS | 75 days supply | Qty: 54 | Fill #0

## 2018-08-23 MED FILL — hydrOXYzine HCL 25 MG TABS: 25 | 30 days supply | Qty: 90 | Fill #1

## 2018-08-23 MED FILL — ?MIRTAZAPINE 30 MG TABLET: 30 | 30 days supply | Qty: 30 | Fill #1

## 2018-08-23 MED FILL — $BREO ELLIPTA 100-25 MCG IH: 100-25 MCG | 90 days supply | Qty: 180 | Fill #0

## 2018-08-23 MED FILL — ?DULoxetine HCL 60MG CPEP: 60 | 30 days supply | Qty: 30 | Fill #1

## 2018-08-23 NOTE — Progress Notes (Unsigned)
Order has been placed.

## 2018-08-23 NOTE — Progress Notes (Signed)
Patient will need a BMP prior to CT scan on 09/01/19.

## 2018-08-24 MED FILL — BENZONATATE 100 MG CAP: 100 | 10 days supply | Qty: 20 | Fill #0

## 2018-08-26 ENCOUNTER — Ambulatory Visit: Payer: Self-pay | Attending: Family Medicine

## 2018-08-26 ENCOUNTER — Telehealth: Payer: Self-pay | Admitting: Family Medicine

## 2018-08-26 DIAGNOSIS — H05822 Myopathy of extraocular muscles, left orbit: Secondary | ICD-10-CM

## 2018-08-27 LAB — BASIC METABOLIC PANEL
BUN/Creatinine Ratio: 6 — ABNORMAL LOW (ref 12–28)
BUN: 5 mg/dL — ABNORMAL LOW (ref 8–27)
CHLORIDE: 103 mmol/L (ref 96–106)
CO2: 22 mmol/L (ref 20–29)
Calcium: 9.5 mg/dL (ref 8.7–10.3)
Creatinine, Ser: 0.8 mg/dL (ref 0.57–1.00)
GFR, EST AFRICAN AMERICAN: 91 mL/min/{1.73_m2} (ref >59)
GFR, EST NON AFRICAN AMERICAN: 79 mL/min/{1.73_m2} (ref >59)
Glucose: 80 mg/dL (ref 65–99)
POTASSIUM: 3.5 mmol/L (ref 3.5–5.2)
Sodium: 144 mmol/L (ref 134–144)

## 2018-08-31 ENCOUNTER — Other Ambulatory Visit: Payer: Self-pay | Admitting: Family Medicine

## 2018-08-31 ENCOUNTER — Ambulatory Visit (HOSPITAL_COMMUNITY)
Admission: RE | Admit: 2018-08-31 | Discharge: 2018-08-31 | Disposition: A | Discharge status: Home / Self Care | Payer: No Typology Code available for payment source | Source: Ambulatory Visit | Attending: Family Medicine | Admitting: Family Medicine

## 2018-08-31 DIAGNOSIS — H0520 Unspecified exophthalmos: Secondary | ICD-10-CM | POA: Insufficient documentation

## 2018-08-31 DIAGNOSIS — H05822 Myopathy of extraocular muscles, left orbit: Secondary | ICD-10-CM

## 2018-08-31 MED ORDER — IOHEXOL 300 MG/ML  SOLN
75.0000 mL | Freq: Once | INTRAMUSCULAR | Status: AC | PRN
  Administered 2018-08-31: 75 mL via INTRAVENOUS

## 2018-09-01 ENCOUNTER — Telehealth: Payer: Self-pay

## 2018-09-01 NOTE — Telephone Encounter (Signed)
-----   Message from Hoy RegisterEnobong Newlin, MD sent at 09/01/2018  1:24 PM EST ----- CT scan of the orbit reveals no abnormal enlargement of the thigh muscles..  Please print out a copy and fax to her ophthalmologist. Fax : [phone] - [facility] [employer]

## 2018-09-01 NOTE — Telephone Encounter (Signed)
Patient was called and informed of CT scan and results will be faxed over to her ophthalmologist.

## 2018-09-06 ENCOUNTER — Telehealth: Payer: Self-pay | Admitting: Family Medicine

## 2018-09-06 NOTE — Telephone Encounter (Signed)
Patient called back to get her CT scan  Results. Please follow up.

## 2018-09-07 NOTE — Telephone Encounter (Signed)
Patient was called and a message was left informing patient to return phone call for CT results.

## 2018-09-28 ENCOUNTER — Other Ambulatory Visit: Payer: Self-pay | Admitting: Family Medicine

## 2018-09-28 DIAGNOSIS — M5416 Radiculopathy, lumbar region: Secondary | ICD-10-CM

## 2018-09-28 MED FILL — MIRTAZAPINE 30 MG TABLET: 30 | 30 days supply | Qty: 30 | Fill #2

## 2018-09-28 MED FILL — GABAPENTIN 300 MG CAPSULE: 300 | 30 days supply | Qty: 180 | Fill #0

## 2018-09-28 MED FILL — hydrOXYzine HCL 25 MG TABS: 25 | 30 days supply | Qty: 90 | Fill #2

## 2018-09-28 NOTE — Telephone Encounter (Signed)
1) Medication(s) Requested (by name): TRAMADOL & TESSOLON  2) Pharmacy of Choice: [facility]  3) Special Requests: Pt is waiting at the pharmacy for provider to sign for refill.  Approved medications will be sent to the pharmacy, we will reach out if there is an issue.  Requests made after 3pm may not be addressed until the following business day!  If a patient is unsure of the name of the medication(s) please note and ask patient to call back when they are able to provide all info, do not send to responsible party until all information is available!

## 2018-09-29 NOTE — Telephone Encounter (Signed)
Pt last seen: 07/14/18 Next appt: n/a Last RX written on: 03/31/18 Date of original fill: 03/31/18 Date of refill(s): 06/21/18  No other controlled substances were filled during this time, please refill if appropriate.

## 2018-09-30 ENCOUNTER — Other Ambulatory Visit: Payer: Self-pay | Admitting: Family Medicine

## 2018-09-30 DIAGNOSIS — M5416 Radiculopathy, lumbar region: Secondary | ICD-10-CM

## 2018-10-01 MED ORDER — BENZONATATE 100 MG PO CAPS: ORAL_CAPSULE | ORAL | 0 refills | Status: DC

## 2018-10-01 MED ORDER — TRAMADOL HCL 50 MG PO TABS: 50.0000 mg | ORAL_TABLET | Freq: Every day | ORAL | 1 refills | Status: DC

## 2018-10-01 MED FILL — traMADol HCL 50 MG TABS: 50 | 30 days supply | Qty: 30 | Fill #0

## 2018-10-01 MED FILL — BENZONATATE 100 MG CAP: 100 | 10 days supply | Qty: 20 | Fill #0

## 2018-10-01 NOTE — Telephone Encounter (Signed)
1) Medication(s) Requested (by name): Tramadol & tessolon  2) Pharmacy of Choice:  [facility]

## 2018-11-03 ENCOUNTER — Ambulatory Visit: Payer: Self-pay | Attending: Family Medicine

## 2018-11-08 ENCOUNTER — Other Ambulatory Visit: Payer: Self-pay | Admitting: Family Medicine

## 2018-11-08 ENCOUNTER — Encounter: Payer: Self-pay | Admitting: Family Medicine

## 2018-11-08 ENCOUNTER — Ambulatory Visit: Payer: Self-pay | Attending: Family Medicine | Admitting: Family Medicine

## 2018-11-08 VITALS — BP 168/75 | HR 88 | Temp 97.6°F | Ht 61.0 in | Wt 108.0 lb

## 2018-11-08 DIAGNOSIS — Z79899 Other long term (current) drug therapy: Secondary | ICD-10-CM | POA: Insufficient documentation

## 2018-11-08 DIAGNOSIS — E785 Hyperlipidemia, unspecified: Secondary | ICD-10-CM | POA: Insufficient documentation

## 2018-11-08 DIAGNOSIS — G4709 Other insomnia: Secondary | ICD-10-CM | POA: Insufficient documentation

## 2018-11-08 DIAGNOSIS — Z7982 Long term (current) use of aspirin: Secondary | ICD-10-CM | POA: Insufficient documentation

## 2018-11-08 DIAGNOSIS — D573 Sickle-cell trait: Secondary | ICD-10-CM | POA: Insufficient documentation

## 2018-11-08 DIAGNOSIS — R634 Abnormal weight loss: Secondary | ICD-10-CM | POA: Insufficient documentation

## 2018-11-08 DIAGNOSIS — Z6820 Body mass index (BMI) 20.0-20.9, adult: Secondary | ICD-10-CM | POA: Insufficient documentation

## 2018-11-08 DIAGNOSIS — R5383 Other fatigue: Secondary | ICD-10-CM

## 2018-11-08 DIAGNOSIS — J449 Chronic obstructive pulmonary disease, unspecified: Secondary | ICD-10-CM | POA: Insufficient documentation

## 2018-11-08 DIAGNOSIS — G8929 Other chronic pain: Secondary | ICD-10-CM | POA: Insufficient documentation

## 2018-11-08 DIAGNOSIS — F419 Anxiety disorder, unspecified: Secondary | ICD-10-CM | POA: Insufficient documentation

## 2018-11-08 DIAGNOSIS — I10 Essential (primary) hypertension: Secondary | ICD-10-CM | POA: Insufficient documentation

## 2018-11-08 DIAGNOSIS — M5416 Radiculopathy, lumbar region: Secondary | ICD-10-CM | POA: Insufficient documentation

## 2018-11-08 DIAGNOSIS — Z7951 Long term (current) use of inhaled steroids: Secondary | ICD-10-CM | POA: Insufficient documentation

## 2018-11-08 MED ORDER — MEGESTROL ACETATE 20 MG PO TABS: 20.0000 mg | ORAL_TABLET | Freq: Every day | ORAL | 0 refills | Status: DC

## 2018-11-08 MED ORDER — DULOXETINE HCL 60 MG PO CPEP: 60.0000 mg | ORAL_CAPSULE | Freq: Every day | ORAL | 6 refills | Status: DC

## 2018-11-08 MED ORDER — TIZANIDINE HCL 4 MG PO TABS: 4.0000 mg | ORAL_TABLET | Freq: Three times a day (TID) | ORAL | 6 refills | Status: DC | PRN

## 2018-11-08 MED ORDER — FLUTICASONE FUROATE-VILANTEROL 100-25 MCG/INH IN AEPB: 1.0000 | INHALATION_SPRAY | Freq: Every day | RESPIRATORY_TRACT | 3 refills | Status: DC

## 2018-11-08 MED ORDER — PANTOPRAZOLE SODIUM 40 MG PO TBEC: 40.0000 mg | DELAYED_RELEASE_TABLET | Freq: Every day | ORAL | 6 refills | Status: DC

## 2018-11-08 MED ORDER — LISINOPRIL-HYDROCHLOROTHIAZIDE 20-12.5 MG PO TABS: 2.0000 | ORAL_TABLET | Freq: Every day | ORAL | 6 refills | Status: DC

## 2018-11-08 MED ORDER — MIRTAZAPINE 30 MG PO TABS: 30.0000 mg | ORAL_TABLET | Freq: Every day | ORAL | 6 refills | Status: DC

## 2018-11-08 MED ORDER — HYDROXYZINE HCL 25 MG PO TABS: 25.0000 mg | ORAL_TABLET | Freq: Three times a day (TID) | ORAL | 2 refills | Status: DC | PRN

## 2018-11-08 MED FILL — tiZANidine HCL 4 MG TABS: 4 | 30 days supply | Qty: 90 | Fill #0

## 2018-11-08 MED FILL — hydrOXYzine HCL 25 MG TABS: 25 | 20 days supply | Qty: 60 | Fill #2

## 2018-11-08 MED FILL — $VENTOLIN HFA 18G INHALER: 108 (90 BAS | 24 days supply | Qty: 18 | Fill #1

## 2018-11-08 MED FILL — BENZONATATE 100 MG CAP: 100 | 10 days supply | Qty: 20 | Fill #0

## 2018-11-08 MED FILL — MIRTAZAPINE 30 MG TABLET: 30 | 30 days supply | Qty: 30 | Fill #3

## 2018-11-08 MED FILL — ?PANTOPRAZOLE SO DR 40MG TA: 40 | 30 days supply | Qty: 30 | Fill #0

## 2018-11-08 MED FILL — MEGESTROL 20 MG TABLET: 20 | 30 days supply | Qty: 30 | Fill #0

## 2018-11-08 MED FILL — LISINOPRIL-HCTZ 20-12.5 MG: 20-12.5 | 30 days supply | Qty: 60 | Fill #2

## 2018-11-08 NOTE — Telephone Encounter (Signed)
This was marked as 'course completed' today during the patients appointment with you today. I notice that she's gotten benzonatate several times in the last few months, she has COPD but she is also on an ACE inhibitor that could be causing a cough as well. Please advise on how to move forward.

## 2018-11-08 NOTE — Progress Notes (Signed)
Subjective:  Patient ID: Tammy Schwartz, female    DOB: Oct 20, 1955  Age: 62 y.o. MRN: [id number]  CC: Hypertension and Cough   HPI Tammy Schwartz  is a 63 year old female with history of hypertension, tobacco abuse, COPD, lumbar spondylosis status post back surgery who presents today for a follow-up visit. Today she is concerned about weight loss and reduced appetite.  She has lost 10 pounds in the last 6 months and had been placed on Remeron previously to help with insomnia and improve her appetite however she continues to have a decreased appetite. She complains of losing 2 brothers over 3 months in the fall of last year but denies being depressed.  She has no energy. Review of her med list indicates she should be on Cymbalta which she was prescribed for pain but adherence with Cymbalta cannot be ascertained as she is unsure of her medications.  She is also on hydroxyzine.  Her back is stable with no recent flares and she uses tramadol as needed.  She had informed the nurse of a cough but does not make mention of this during the encounter. Denies COPD exacerbations.  Past Medical History:  Diagnosis Date  . Chronic back pain    HNp,spondylosis,radiculopathy  . COPD (chronic obstructive pulmonary disease) (HCC)   . Hyperlipidemia    was on choloesterol meds last yr-samples given in office but not needed since  . Hypertension    takes Exforge daily  . Joint pain   . Shortness of breath    takes Singulair daily;with exertion  . Sickle cell trait (HCC)   . Spondylolysis   . Tobacco use     Past Surgical History:  Procedure Laterality Date  . BACK SURGERY    . BREAST EXCISIONAL BIOPSY Left 2007   benign  . BREAST SURGERY Left    lumpectomy  . COLONOSCOPY    . DILATION AND CURETTAGE OF UTERUS    . LUMBAR LAMINECTOMY/DECOMPRESSION MICRODISCECTOMY  06/04/2012   Procedure: LUMBAR LAMINECTOMY/DECOMPRESSION MICRODISCECTOMY 1 LEVEL;  Surgeon: Carmela Hurt, MD;  Location: [facility];  Service: Neurosurgery;  Laterality: Left;  LEFT Lumbar five-sacral one diskectomy  . LUMBAR LAMINECTOMY/DECOMPRESSION MICRODISCECTOMY Right 06/16/2013   Procedure: RIGHT Lumbar Five-Sacral One Microdiskectomy;  Surgeon: Carmela Hurt, MD;  Location: [facility];  Service: Neurosurgery;  Laterality: Right;  RIGHT Lumbar Five-Sacral One Microdiskectomy  . LUMBAR LAMINECTOMY/DECOMPRESSION MICRODISCECTOMY Right 01/20/2014   Procedure: LUMBAR FOUR TO LUMBAR FIVE LUMBAR LAMINECTOMY/DECOMPRESSION MICRODISCECTOMY 1 LEVEL;  Surgeon: Carmela Hurt, MD;  Location: [facility];  Service: Neurosurgery;  Laterality: Right;  Right L45 laminectomy and foramintomy    No Known Allergies   Outpatient Medications Prior to Visit  Medication Sig Dispense Refill  . cetirizine (ZYRTEC) 10 MG tablet Take 1 tablet (10 mg total) by mouth daily. 30 tablet 11  . fluticasone (FLONASE) 50 MCG/ACT nasal spray Place 1 spray into both nostrils daily. 16 g 0  . gabapentin (NEURONTIN) 300 MG capsule Take 2 capsules (600 mg total) by mouth 3 (three) times daily. 180 capsule 3  . lactulose (CHRONULAC) 10 GM/15ML solution Take 15 mLs (10 g total) by mouth 2 (two) times daily as needed for mild constipation. 946 mL 0  . meclizine (ANTIVERT) 25 MG tablet Take 1 tablet (25 mg total) by mouth 3 (three) times daily as needed for dizziness. 60 tablet 1  . montelukast (SINGULAIR) 10 MG tablet Take 10 mg by mouth daily.     Marland Kitchen  traMADol (ULTRAM) 50 MG tablet Take 1 tablet (50 mg total) by mouth at bedtime. 30 tablet 1  . VENTOLIN HFA 108 (90 Base) MCG/ACT inhaler INHALE 2 PUFFS INTO THE LUNGS EVERY 6 HOURS AS NEEDED 18 g 3  . fluticasone furoate-vilanterol (BREO ELLIPTA) 100-25 MCG/INH AEPB Inhale 1 puff into the lungs daily. 90 each 3  . hydrOXYzine (ATARAX/VISTARIL) 25 MG tablet Take 1 tablet (25 mg total) by mouth every 8 (eight) hours as needed. 90 tablet 2  . lisinopril-hydrochlorothiazide (ZESTORETIC) 20-12.5 MG tablet Take  2 tablets by mouth daily. 60 tablet 6  . mirtazapine (REMERON) 30 MG tablet Take 1 tablet (30 mg total) by mouth at bedtime. 30 tablet 3  . tiZANidine (ZANAFLEX) 4 MG tablet Take 1 tablet (4 mg total) by mouth every 8 (eight) hours as needed for muscle spasms. 90 tablet 3  . acetaminophen (TYLENOL) 325 MG tablet Take 2 tablets (650 mg total) by mouth every 6 (six) hours as needed. (Patient not taking: Reported on 07/06/2018) 30 tablet 0  . aspirin EC 81 MG tablet Take 162 mg by mouth daily.    . diclofenac (VOLTAREN) 75 MG EC tablet Take 1 tablet (75 mg total) by mouth 2 (two) times daily. Prn pain (Patient not taking: Reported on 11/08/2018) 60 tablet 3  . Multiple Vitamin (MULTIVITAMIN WITH MINERALS) TABS Take 1 tablet by mouth daily.    . polyethylene glycol powder (GLYCOLAX/MIRALAX) powder Take 17 g by mouth daily. (Patient not taking: Reported on 11/08/2018) 3350 g 1  . benzonatate (TESSALON) 100 MG capsule TAKE 1 CAPSULE BY MOUTH 2 TIMES DAILY AS NEEDED FOR COUGH. (Patient not taking: Reported on 11/08/2018) 20 capsule 0  . DULoxetine (CYMBALTA) 60 MG capsule Take 1 capsule (60 mg total) by mouth daily. (Patient not taking: Reported on 11/08/2018) 30 capsule 3  . pantoprazole (PROTONIX) 40 MG tablet Take 1 tablet (40 mg total) by mouth daily. 30 tablet 0   No facility-administered medications prior to visit.     ROS Review of Systems  Constitutional: Positive for appetite change and unexpected weight change. Negative for activity change and fatigue.  HENT: Negative for congestion, sinus pressure and sore throat.   Eyes: Negative for visual disturbance.  Respiratory: Negative for cough, chest tightness, shortness of breath and wheezing.   Cardiovascular: Negative for chest pain and palpitations.  Gastrointestinal: Negative for abdominal distention, abdominal pain and constipation.  Endocrine: Negative for polydipsia.  Genitourinary: Negative for dysuria and frequency.  Musculoskeletal:  Negative for arthralgias and back pain.  Skin: Negative for rash.  Neurological: Negative for tremors, light-headedness and numbness.  Hematological: Does not bruise/bleed easily.  Psychiatric/Behavioral: Positive for sleep disturbance. Negative for agitation and behavioral problems.    Objective:  BP (!) 168/75   Pulse 88   Temp 97.6 F (36.4 C) (Oral)   Ht 5\' 1"  (1.549 m)   Wt 108 lb (49 kg)   LMP 03/17/2012   SpO2 97%   BMI 20.41 kg/m   BP/Weight 11/08/2018 07/14/2018 07/08/2018  Systolic BP 168 135 120  Diastolic BP 75 80 62  Wt. (Lbs) 108 115.8 -  BMI 20.41 21.88 -      Physical Exam Constitutional:      Appearance: She is well-developed.     Comments: Thin  Cardiovascular:     Rate and Rhythm: Normal rate.     Heart sounds: Normal heart sounds. No murmur.  Pulmonary:     Effort: Pulmonary effort is normal.  Breath sounds: Normal breath sounds. No wheezing or rales.  Chest:     Chest wall: No tenderness.  Abdominal:     General: Bowel sounds are normal. There is no distension.     Palpations: Abdomen is soft. There is no mass.     Tenderness: There is no abdominal tenderness.  Musculoskeletal: Normal range of motion.  Neurological:     Mental Status: She is alert and oriented to person, place, and time.  Psychiatric:        Mood and Affect: Mood normal.     CMP Latest Ref Rng & Units 08/26/2018 07/08/2018 07/07/2018  Glucose 65 - 99 mg/dL 80 657(Q115(H) -  BUN 8 - 27 mg/dL 5(L) 8 -  Creatinine 4.690.57 - 1.00 mg/dL 6.290.80 5.280.91 4.130.97  Sodium 134 - 144 mmol/L 144 145 -  Potassium 3.5 - 5.2 mmol/L 3.5 4.6 -  Chloride 96 - 106 mmol/L 103 109 -  CO2 20 - 29 mmol/L 22 28 -  Calcium 8.7 - 10.3 mg/dL 9.5 2.4(M8.8(L) -  Total Protein 6.5 - 8.1 g/dL - - -  Total Bilirubin 0.3 - 1.2 mg/dL - - -  Alkaline Phos 38 - 126 U/L - - -  AST 15 - 41 U/L - - -  ALT 0 - 44 U/L - - -    Lipid Panel     Component Value Date/Time   CHOL 204 (H) 01/01/2018 1223   TRIG 92 01/01/2018  1223   HDL 102 01/01/2018 1223   CHOLHDL 2.0 01/01/2018 1223   CHOLHDL 2.3 05/07/2016 0739   VLDL 20 05/07/2016 0739   LDLCALC 84 01/01/2018 1223    Lab Results  Component Value Date   TSH 1.706 06/21/2018     Assessment & Plan:   1. Lumbar radiculopathy Stable She uses tramadol as needed Consider switching to Tylenol 3 to reduce the risk of serotonin syndrome - tiZANidine (ZANAFLEX) 4 MG tablet; Take 1 tablet (4 mg total) by mouth every 8 (eight) hours as needed for muscle spasms.  Dispense: 90 tablet; Refill: 6 - DULoxetine (CYMBALTA) 60 MG capsule; Take 1 capsule (60 mg total) by mouth daily.  Dispense: 30 capsule; Refill: 6  2. 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  Dispense: 30 tablet; Refill: 6  3. Essential hypertension Uncontrolled-yet to take antihypertensives this morning - lisinopril-hydrochlorothiazide (ZESTORETIC) 20-12.5 MG tablet; Take 2 tablets by mouth daily.  Dispense: 60 tablet; Refill: 6  4. Anxiety Uncontrolled due to underlying bereavement Will benefit from grief counseling and I have discussed this with her and options available - hydrOXYzine (ATARAX/VISTARIL) 25 MG tablet; Take 1 tablet (25 mg total) by mouth every 8 (eight) hours as needed.  Dispense: 90 tablet; Refill: 2  5. Chronic obstructive pulmonary disease, unspecified COPD type (HCC) No acute exacerbation - fluticasone furoate-vilanterol (BREO ELLIPTA) 100-25 MCG/INH AEPB; Inhale 1 puff into the lungs daily.  Dispense: 90 each; Refill: 3  6. Other fatigue - VITAMIN D 25 Hydroxy (Vit-D Deficiency, Fractures)  7. Weight loss Due to recent bereavement and reduced appetite Discussed risks and benefit of Megace; will give short course - megestrol (MEGACE) 20 MG tablet; Take 1 tablet (20 mg total) by mouth daily.  Dispense: 30 tablet; Refill: 0   Meds ordered this encounter    Medications  . megestrol (MEGACE) 20 MG tablet    Sig: Take 1 tablet (20  mg total) by mouth daily.    Dispense:  30 tablet    Refill:  0  . tiZANidine (ZANAFLEX) 4 MG tablet    Sig: Take 1 tablet (4 mg total) by mouth every 8 (eight) hours as needed for muscle spasms.    Dispense:  90 tablet    Refill:  6  . pantoprazole (PROTONIX) 40 MG tablet    Sig: Take 1 tablet (40 mg total) by mouth daily for 30 days.    Dispense:  30 tablet    Refill:  6  . mirtazapine (REMERON) 30 MG tablet    Sig: Take 1 tablet (30 mg total) by mouth at bedtime.    Dispense:  30 tablet    Refill:  6    Discontinue previous dose  . lisinopril-hydrochlorothiazide (ZESTORETIC) 20-12.5 MG tablet    Sig: Take 2 tablets by mouth daily.    Dispense:  60 tablet    Refill:  6  . hydrOXYzine (ATARAX/VISTARIL) 25 MG tablet    Sig: Take 1 tablet (25 mg total) by mouth every 8 (eight) hours as needed.    Dispense:  90 tablet    Refill:  2  . fluticasone furoate-vilanterol (BREO ELLIPTA) 100-25 MCG/INH AEPB    Sig: Inhale 1 puff into the lungs daily.    Dispense:  90 each    Refill:  3  . DULoxetine (CYMBALTA) 60 MG capsule    Sig: Take 1 capsule (60 mg total) by mouth daily.    Dispense:  30 capsule    Refill:  6    Follow-up: Return in about 3 months (around 02/07/2019) for Follow-up of chronic medical conditions.   Hoy Register MD

## 2018-11-09 LAB — VITAMIN D 25 HYDROXY (VIT D DEFICIENCY, FRACTURES): Vit D, 25-Hydroxy: 36.4 ng/mL (ref 30.0–100.0)

## 2018-11-15 ENCOUNTER — Telehealth: Payer: Self-pay

## 2018-11-15 NOTE — Telephone Encounter (Signed)
-----   Message from Hoy Register, MD sent at 11/09/2018  4:47 PM EST ----- Please inform the patient that labs are normal. Thank you.

## 2018-11-15 NOTE — Telephone Encounter (Signed)
Patient was called and informed of lab results. 

## 2018-11-17 ENCOUNTER — Other Ambulatory Visit: Payer: Self-pay | Admitting: Family Medicine

## 2018-11-17 DIAGNOSIS — Z1231 Encounter for screening mammogram for malignant neoplasm of breast: Secondary | ICD-10-CM

## 2018-12-02 ENCOUNTER — Other Ambulatory Visit: Payer: Self-pay | Admitting: Family Medicine

## 2018-12-02 MED FILL — $BREO ELLIPTA 100-25 MCG IH: 100-25 MCG | 90 days supply | Qty: 180 | Fill #1

## 2018-12-02 MED FILL — ?PANTOPRAZOLE SOD DR 40MG T: 40 | 30 days supply | Qty: 30 | Fill #1

## 2018-12-02 MED FILL — hydrOXYzine HCL 25 MG TABS: 25 | 30 days supply | Qty: 90 | Fill #0

## 2018-12-02 MED FILL — ?MECLIZINE 25MG TAB: 25 | 20 days supply | Qty: 60 | Fill #1

## 2018-12-08 MED FILL — BENZONATATE 100 MG CAP: 100 | 10 days supply | Qty: 20 | Fill #0

## 2018-12-15 ENCOUNTER — Other Ambulatory Visit: Payer: Self-pay | Admitting: Family Medicine

## 2018-12-15 DIAGNOSIS — R634 Abnormal weight loss: Secondary | ICD-10-CM

## 2018-12-20 ENCOUNTER — Other Ambulatory Visit: Payer: Self-pay | Admitting: Family Medicine

## 2018-12-20 ENCOUNTER — Telehealth: Payer: Self-pay | Admitting: Family Medicine

## 2018-12-20 DIAGNOSIS — R634 Abnormal weight loss: Secondary | ICD-10-CM

## 2018-12-20 NOTE — Telephone Encounter (Signed)
New Message   1) Medication(s) Requested (by name): megestrol (MEGACE) 20 MG tablet  2) Pharmacy of Choice: [facility]  3) Special Requests:   Approved medications will be sent to the pharmacy, we will reach out if there is an issue.  Requests made after 3pm may not be addressed until the following business day!  If a patient is unsure of the name of the medication(s) please note and ask patient to call back when they are able to provide all info, do not send to responsible party until all information is available!

## 2018-12-20 NOTE — Telephone Encounter (Signed)
It was a 30-day supply to assist with weight gain and not meant for chronic use.

## 2018-12-20 NOTE — Telephone Encounter (Signed)
You have denied this request and put 'refill not appropriate' for her megestrol. I am not sure why the patient was given this response since this drug is still on her med list. Please clarify why the medication is not appropriate for refill so the patient can be notified.

## 2018-12-21 ENCOUNTER — Other Ambulatory Visit (HOSPITAL_COMMUNITY): Payer: Self-pay | Admitting: *Deleted

## 2018-12-21 ENCOUNTER — Telehealth: Payer: Self-pay

## 2018-12-21 ENCOUNTER — Telehealth (HOSPITAL_COMMUNITY): Payer: Self-pay | Admitting: *Deleted

## 2018-12-21 DIAGNOSIS — Z1231 Encounter for screening mammogram for malignant neoplasm of breast: Secondary | ICD-10-CM

## 2018-12-21 NOTE — Telephone Encounter (Signed)
Telephoned patient at home number and left message to return call to [facility] 

## 2018-12-21 NOTE — Telephone Encounter (Signed)
Left vm for patient letting her know why this will not be refilled, she was instructed to call back with any additional questions.

## 2018-12-21 NOTE — Telephone Encounter (Signed)
Patient returned call and confirmed DOB. I relayed Dr. Baxter Flattery note to her and she understood. She said the medication was very helpful in increasing her appetite and that she still has a decent appetite despite being out of the medicine for about a week. She has an appointment on 02/08/19 to follow up, she was told to reach out to the office if she notices any significant decreases in her appetite between now and then.

## 2018-12-29 ENCOUNTER — Encounter: Payer: Self-pay | Admitting: Emergency Medicine

## 2018-12-29 ENCOUNTER — Other Ambulatory Visit: Payer: Self-pay

## 2018-12-29 ENCOUNTER — Ambulatory Visit
Admission: EM | Admit: 2018-12-29 | Discharge: 2018-12-29 | Disposition: A | Discharge status: Home / Self Care | Payer: Self-pay | Attending: Emergency Medicine | Admitting: Emergency Medicine

## 2018-12-29 DIAGNOSIS — L089 Local infection of the skin and subcutaneous tissue, unspecified: Secondary | ICD-10-CM | POA: Insufficient documentation

## 2018-12-29 DIAGNOSIS — L03011 Cellulitis of right finger: Secondary | ICD-10-CM | POA: Insufficient documentation

## 2018-12-29 MED ORDER — DOXYCYCLINE HYCLATE 100 MG PO CAPS: 100.0000 mg | ORAL_CAPSULE | Freq: Two times a day (BID) | ORAL | 0 refills | Status: AC

## 2018-12-29 MED ORDER — CHLORHEXIDINE GLUCONATE 4 % EX LIQD: Freq: Every day | CUTANEOUS | 0 refills | Status: DC | PRN

## 2018-12-29 MED ORDER — IBUPROFEN 400 MG PO TABS: 400.0000 mg | ORAL_TABLET | Freq: Four times a day (QID) | ORAL | 0 refills | Status: AC | PRN

## 2018-12-29 NOTE — ED Provider Notes (Signed)
HPI  SUBJECTIVE:  Tammy Schwartz is a right-handed 63 y.o. female who presents with right index finger pain, swelling with a pustular lesion on the dorsum of the finger starting yesterday.  She describes the pain as throbbing, constant.  She denies trauma, known bite.  No fevers, body aches.  No limitation of motion of the finger.  No numbness or tingling.  She tried warm Epsom salt soaks without improvement in her symptoms.  Symptoms worse with palpation.  No contacts with MRSA.  She has no history of MRSA, diabetes.  She has a history of hypertension, is a smoker.  PMD: Hoy Register, MD   Past Medical History:  Diagnosis Date  . Chronic back pain    HNp,spondylosis,radiculopathy  . COPD (chronic obstructive pulmonary disease) (HCC)   . Hyperlipidemia    was on choloesterol meds last yr-samples given in office but not needed since  . Hypertension    takes Exforge daily  . Joint pain   . Shortness of breath    takes Singulair daily;with exertion  . Sickle cell trait (HCC)   . Spondylolysis   . Tobacco use     Past Surgical History:  Procedure Laterality Date  . BACK SURGERY    . BREAST EXCISIONAL BIOPSY Left 2007   benign  . BREAST SURGERY Left    lumpectomy  . COLONOSCOPY    . DILATION AND CURETTAGE OF UTERUS    . LUMBAR LAMINECTOMY/DECOMPRESSION MICRODISCECTOMY  06/04/2012   Procedure: LUMBAR LAMINECTOMY/DECOMPRESSION MICRODISCECTOMY 1 LEVEL;  Surgeon: Carmela Hurt, MD;  Location: [facility];  Service: Neurosurgery;  Laterality: Left;  LEFT Lumbar five-sacral one diskectomy  . LUMBAR LAMINECTOMY/DECOMPRESSION MICRODISCECTOMY Right 06/16/2013   Procedure: RIGHT Lumbar Five-Sacral One Microdiskectomy;  Surgeon: Carmela Hurt, MD;  Location: [facility];  Service: Neurosurgery;  Laterality: Right;  RIGHT Lumbar Five-Sacral One Microdiskectomy  . LUMBAR LAMINECTOMY/DECOMPRESSION MICRODISCECTOMY Right 01/20/2014   Procedure: LUMBAR FOUR TO LUMBAR FIVE LUMBAR  LAMINECTOMY/DECOMPRESSION MICRODISCECTOMY 1 LEVEL;  Surgeon: Carmela Hurt, MD;  Location: [facility];  Service: Neurosurgery;  Laterality: Right;  Right L45 laminectomy and foramintomy    Family History  Problem Relation Age of Onset  . Cancer - Other Mother   . CAD Father        Died age 18  . Hypertension Sister     Social History   Tobacco Use  . Smoking status: Current Every Day Smoker    Packs/day: 0.25    Years: 20.00    Pack years: 5.00    Types: Cigarettes  . Smokeless tobacco: Never Used  Substance Use Topics  . Alcohol use: No  . Drug use: No    No current facility-administered medications for this encounter.   Current Outpatient Medications:  .  aspirin EC 81 MG tablet, Take 162 mg by mouth daily., Disp: , Rfl:  .  cetirizine (ZYRTEC) 10 MG tablet, Take 1 tablet (10 mg total) by mouth daily., Disp: 30 tablet, Rfl: 11 .  chlorhexidine (HIBICLENS) 4 % external liquid, Apply topically daily as needed. Dilute 10-15 mL in water, Use daily when bathing for 1-2 weeks, Disp: 120 mL, Rfl: 0 .  doxycycline (VIBRAMYCIN) 100 MG capsule, Take 1 capsule (100 mg total) by mouth 2 (two) times daily for 5 days., Disp: 10 capsule, Rfl: 0 .  DULoxetine (CYMBALTA) 60 MG capsule, Take 1 capsule (60 mg total) by mouth daily., Disp: 30 capsule, Rfl: 6 .  fluticasone (FLONASE) 50 MCG/ACT nasal  spray, Place 1 spray into both nostrils daily., Disp: 16 g, Rfl: 0 .  fluticasone furoate-vilanterol (BREO ELLIPTA) 100-25 MCG/INH AEPB, Inhale 1 puff into the lungs daily., Disp: 90 each, Rfl: 3 .  gabapentin (NEURONTIN) 300 MG capsule, Take 2 capsules (600 mg total) by mouth 3 (three) times daily., Disp: 180 capsule, Rfl: 3 .  hydrOXYzine (ATARAX/VISTARIL) 25 MG tablet, Take 1 tablet (25 mg total) by mouth every 8 (eight) hours as needed., Disp: 90 tablet, Rfl: 2 .  ibuprofen (ADVIL,MOTRIN) 400 MG tablet, Take 1 tablet (400 mg total) by mouth every 6 (six) hours as needed for up to 7 days., Disp:  28 tablet, Rfl: 0 .  lactulose (CHRONULAC) 10 GM/15ML solution, Take 15 mLs (10 g total) by mouth 2 (two) times daily as needed for mild constipation., Disp: 946 mL, Rfl: 0 .  lisinopril-hydrochlorothiazide (ZESTORETIC) 20-12.5 MG tablet, Take 2 tablets by mouth daily., Disp: 60 tablet, Rfl: 6 .  meclizine (ANTIVERT) 25 MG tablet, Take 1 tablet (25 mg total) by mouth 3 (three) times daily as needed for dizziness., Disp: 60 tablet, Rfl: 1 .  megestrol (MEGACE) 20 MG tablet, Take 1 tablet (20 mg total) by mouth daily., Disp: 30 tablet, Rfl: 0 .  mirtazapine (REMERON) 30 MG tablet, Take 1 tablet (30 mg total) by mouth at bedtime., Disp: 30 tablet, Rfl: 6 .  montelukast (SINGULAIR) 10 MG tablet, Take 10 mg by mouth daily. , Disp: , Rfl:  .  Multiple Vitamin (MULTIVITAMIN WITH MINERALS) TABS, Take 1 tablet by mouth daily., Disp: , Rfl:  .  pantoprazole (PROTONIX) 40 MG tablet, Take 1 tablet (40 mg total) by mouth daily for 30 days., Disp: 30 tablet, Rfl: 6 .  polyethylene glycol powder (GLYCOLAX/MIRALAX) powder, Take 17 g by mouth daily. (Patient not taking: Reported on 11/08/2018), Disp: 3350 g, Rfl: 1 .  tiZANidine (ZANAFLEX) 4 MG tablet, Take 1 tablet (4 mg total) by mouth every 8 (eight) hours as needed for muscle spasms., Disp: 90 tablet, Rfl: 6 .  traMADol (ULTRAM) 50 MG tablet, Take 1 tablet (50 mg total) by mouth at bedtime., Disp: 30 tablet, Rfl: 1 .  VENTOLIN HFA 108 (90 Base) MCG/ACT inhaler, INHALE 2 PUFFS INTO THE LUNGS EVERY 6 HOURS AS NEEDED, Disp: 18 g, Rfl: 3  No Known Allergies   ROS  As noted in HPI.   Physical Exam  BP (!) 144/80 (BP Location: Left Arm)   Pulse 83   Temp 97.9 F (36.6 C) (Oral)   Resp 18   LMP 03/17/2012   SpO2 96%   Constitutional: Well developed, well nourished, no acute distress Eyes:  EOMI, conjunctiva normal bilaterally HENT: Normocephalic, atraumatic,mucus membranes moist Respiratory: Normal inspiratory effort Cardiovascular: Normal rate GI:  nondistended skin: See musculoskeletal exam Musculoskeletal: Tender area of erythema, edema, central pustule on the dorsum of the right index finger.  No tenderness along the flexor tendons.  see picture.      Neurologic: Alert & oriented x 3, no focal neuro deficits Psychiatric: Speech and behavior appropriate   ED Course   Medications - No data to display  Orders Placed This Encounter  Procedures  . Aerobic Culture (superficial specimen)    Standing Status:   Standing    Number of Occurrences:   1    No results found for this or any previous visit (from the past 24 hour(s)). No results found.  ED Clinical Impression  Cellulitis of right index finger  Skin pustule  ED Assessment/Plan  Procedure note: Cleaned area with alcohol.  Using sterile 18-gauge needle to debrief pustule.  Expressed a small amount of purulent bloody drainage.  Sent this off for culture.  Then applied bacitracin and dressing.  Patient tolerated procedure well.  Presentation consistent with a folliculitis/pustule of the finger.  Sending home with doxycycline, chlorhexidine, 40 mg ibuprofen with 500 mg of Tylenol 3-4 times a day as needed for pain.  Follow-up with her PMD in several days if not getting any better.  Discussed  MDM, treatment plan, and plan for follow-up with patient.  patient agrees with plan.   Meds ordered this encounter  Medications  . chlorhexidine (HIBICLENS) 4 % external liquid    Sig: Apply topically daily as needed. Dilute 10-15 mL in water, Use daily when bathing for 1-2 weeks    Dispense:  120 mL    Refill:  0  . doxycycline (VIBRAMYCIN) 100 MG capsule    Sig: Take 1 capsule (100 mg total) by mouth 2 (two) times daily for 5 days.    Dispense:  10 capsule    Refill:  0  . ibuprofen (ADVIL,MOTRIN) 400 MG tablet    Sig: Take 1 tablet (400 mg total) by mouth every 6 (six) hours as needed for up to 7 days.    Dispense:  28 tablet    Refill:  0    *This clinic note was  created using Scientist, clinical (histocompatibility and immunogenetics). Therefore, there may be occasional mistakes despite careful proofreading.   ?   Domenick Gong, MD 12/29/18 2053

## 2018-12-29 NOTE — Discharge Instructions (Addendum)
400 mg of ibuprofen combined with 100 mg of Tylenol together 3 or 4 times a day as needed for pain.  Finish doxycycline, even if you feel better.  You may apply bacitracin to the area.  Keep this clean with chlorhexidine soap and water.  Follow-up with your primary care physician, especially if it is not getting any better.

## 2018-12-29 NOTE — ED Notes (Signed)
Patient able to ambulate independently  

## 2018-12-29 NOTE — ED Triage Notes (Signed)
Pt presents to [facility] for assessment of knuckle for pointer finger swelling and causing her pain.  Denies known injury.  Pustular area noted.

## 2019-01-02 LAB — AEROBIC CULTURE  (SUPERFICIAL SPECIMEN)

## 2019-01-02 LAB — AEROBIC CULTURE W GRAM STAIN (SUPERFICIAL SPECIMEN)

## 2019-01-03 ENCOUNTER — Telehealth (HOSPITAL_COMMUNITY): Payer: Self-pay | Admitting: Emergency Medicine

## 2019-01-03 NOTE — Telephone Encounter (Signed)
Spoke with pt given results verbalized understanding of test results

## 2019-01-04 ENCOUNTER — Other Ambulatory Visit: Payer: Self-pay | Admitting: Family Medicine

## 2019-01-05 ENCOUNTER — Other Ambulatory Visit: Payer: Self-pay | Admitting: Family Medicine

## 2019-01-05 MED FILL — LISINOPRIL-HCTZ 20-12.5 MG: 20-12.5 | 30 days supply | Qty: 60 | Fill #3

## 2019-01-05 MED FILL — PANTOPRAZOLE SOD DR 40 MG T: 40 | 30 days supply | Qty: 30 | Fill #2

## 2019-01-05 MED FILL — GABAPENTIN 300 MG CAPSULE: 300 | 30 days supply | Qty: 180 | Fill #1

## 2019-01-05 MED FILL — hydrOXYzine HCL 25 MG TABS: 25 | 30 days supply | Qty: 90 | Fill #1

## 2019-01-05 MED FILL — MIRTAZAPINE 30 MG TABLET: 30 | 30 days supply | Qty: 30 | Fill #0

## 2019-01-05 NOTE — Telephone Encounter (Signed)
Refill request

## 2019-01-07 MED FILL — BENZONATATE 100 MG CAP: 100 | 10 days supply | Qty: 20 | Fill #0

## 2019-01-08 ENCOUNTER — Other Ambulatory Visit: Payer: Self-pay | Admitting: Family Medicine

## 2019-01-10 ENCOUNTER — Encounter: Payer: Self-pay | Admitting: Family Medicine

## 2019-01-10 NOTE — Telephone Encounter (Signed)
Refill request for [facility]

## 2019-01-11 ENCOUNTER — Telehealth: Payer: Self-pay | Admitting: Family Medicine

## 2019-01-11 NOTE — Telephone Encounter (Signed)
New Message   Pt wants to know why she is still getting bills if they are suppose to be paid and is requesting her letter be faxed. Please f/u

## 2019-02-06 ENCOUNTER — Other Ambulatory Visit: Payer: Self-pay | Admitting: Family Medicine

## 2019-02-07 NOTE — Telephone Encounter (Signed)
Refill request

## 2019-02-08 ENCOUNTER — Other Ambulatory Visit: Payer: Self-pay

## 2019-02-08 ENCOUNTER — Encounter: Payer: Self-pay | Admitting: Family Medicine

## 2019-02-08 ENCOUNTER — Ambulatory Visit: Payer: Self-pay | Attending: Family Medicine | Admitting: Family Medicine

## 2019-02-08 ENCOUNTER — Other Ambulatory Visit: Payer: Self-pay | Admitting: Family Medicine

## 2019-02-08 DIAGNOSIS — L299 Pruritus, unspecified: Secondary | ICD-10-CM

## 2019-02-08 DIAGNOSIS — R05 Cough: Secondary | ICD-10-CM

## 2019-02-08 DIAGNOSIS — R058 Other specified cough: Secondary | ICD-10-CM

## 2019-02-08 DIAGNOSIS — T464X5A Adverse effect of angiotensin-converting-enzyme inhibitors, initial encounter: Principal | ICD-10-CM

## 2019-02-08 DIAGNOSIS — M5416 Radiculopathy, lumbar region: Secondary | ICD-10-CM

## 2019-02-08 DIAGNOSIS — I10 Essential (primary) hypertension: Secondary | ICD-10-CM

## 2019-02-08 MED ORDER — BENZONATATE 100 MG PO CAPS: 100.0000 mg | ORAL_CAPSULE | Freq: Two times a day (BID) | ORAL | 0 refills | Status: DC | PRN

## 2019-02-08 MED ORDER — AMLODIPINE BESYLATE 10 MG PO TABS: 10.0000 mg | ORAL_TABLET | Freq: Every day | ORAL | 1 refills | Status: DC

## 2019-02-08 MED ORDER — CARVEDILOL 3.125 MG PO TABS: 3.1250 mg | ORAL_TABLET | Freq: Two times a day (BID) | ORAL | 3 refills | Status: DC

## 2019-02-08 MED ORDER — TRIAMCINOLONE ACETONIDE 0.1 % EX CREA: 1.0000 "application " | TOPICAL_CREAM | Freq: Two times a day (BID) | CUTANEOUS | 1 refills | Status: DC

## 2019-02-08 MED FILL — ?CARVEDILOL 3.125 MG TABLET: 3.125 | 90 days supply | Qty: 180 | Fill #0

## 2019-02-08 MED FILL — ?AMLODIPINE BESYLATE 10 MG: 10 | 90 days supply | Qty: 90 | Fill #0

## 2019-02-08 MED FILL — BENZONATATE 100 MG CAPS: 100 | 10 days supply | Qty: 20 | Fill #0

## 2019-02-08 NOTE — Progress Notes (Signed)
Patient has been called and DOB has been verified. Patient has been screened and transferred to PCP to start phone visit.  C/C: hypertension, leg pain.

## 2019-02-08 NOTE — Telephone Encounter (Signed)
Patient needs a visit for refill.

## 2019-02-08 NOTE — Progress Notes (Signed)
Virtual Visit via Telephone Note  I connected with Tammy MacadamVerna S Schwartz, on 02/08/2019 at 3:47 PM by telephone and verified that I am speaking with the correct person using two identifiers.   Consent: I discussed the limitations, risks, security and privacy concerns of performing an evaluation and management service by telephone and the availability of in person appointments. I also discussed with the patient that there may be a patient responsible charge related to this service. The patient expressed understanding and agreed to proceed.   Location of Patient: Home  Location of Provider: Clinic   Persons participating in Telemedicine visit: Tammy CasaVerna S Schwartz  Alicia Farrington-CMA Dr. Nelwyn SalisburyNewlin-PCP     History of Present Illness: Tammy Schwartz  is a 63 year old female with history of hypertension, tobacco abuse, COPD, lumbar spondylosis status post back surgery who presents today for valuation for ongoing cough. She has repeatedly requested refills of Tessalon Perles over the last couple of months.  She distressed his cough is a tickle in her throat.  Of note she remains on lisinopril/hydrochlorothiazide for hypertension.  Cough is not associated with dyspnea or chest pain and she has no fever or wheezing or postnasal drip. She also has a history of COPD and is on Proventil,and Breo.  Her lumbar radiculopathy is uncontrolled despite use of Cymbalta, gabapentin and tizanidine.  She is unhappy that symptoms have persisted despite lumbar spine surgery.  She denies recent falls but states that occasionally her legs will give out on her. She is requesting a cream which she has used in the past for generalized pruritus.  During her evaluations at previous office visits no rash has been identified.  She also takes hydroxyzine for same.   Past Medical History:  Diagnosis Date  . Chronic back pain    HNp,spondylosis,radiculopathy  . COPD (chronic obstructive pulmonary disease) (HCC)   .  Hyperlipidemia    was on choloesterol meds last yr-samples given in office but not needed since  . Hypertension    takes Exforge daily  . Joint pain   . Shortness of breath    takes Singulair daily;with exertion  . Sickle cell trait (HCC)   . Spondylolysis   . Tobacco use    No Known Allergies  Current Outpatient Medications on File Prior to Visit  Medication Sig Dispense Refill  . aspirin EC 81 MG tablet Take 162 mg by mouth daily.    . benzonatate (TESSALON) 100 MG capsule TAKE 1 CAPSULE BY MOUTH 2 TIMES DAILY AS NEEDED FOR COUGH. 20 capsule 0  . cetirizine (ZYRTEC) 10 MG tablet Take 1 tablet (10 mg total) by mouth daily. 30 tablet 11  . chlorhexidine (HIBICLENS) 4 % external liquid Apply topically daily as needed. Dilute 10-15 mL in water, Use daily when bathing for 1-2 weeks 120 mL 0  . DULoxetine (CYMBALTA) 60 MG capsule Take 1 capsule (60 mg total) by mouth daily. 30 capsule 6  . fluticasone (FLONASE) 50 MCG/ACT nasal spray Place 1 spray into both nostrils daily. 16 g 0  . fluticasone furoate-vilanterol (BREO ELLIPTA) 100-25 MCG/INH AEPB Inhale 1 puff into the lungs daily. 90 each 3  . gabapentin (NEURONTIN) 300 MG capsule Take 2 capsules (600 mg total) by mouth 3 (three) times daily. 180 capsule 3  . hydrOXYzine (ATARAX/VISTARIL) 25 MG tablet Take 1 tablet (25 mg total) by mouth every 8 (eight) hours as needed. 90 tablet 2  . lactulose (CHRONULAC) 10 GM/15ML solution Take 15 mLs (10 g total) by mouth 2 (  two) times daily as needed for mild constipation. 946 mL 0  . lisinopril-hydrochlorothiazide (ZESTORETIC) 20-12.5 MG tablet Take 2 tablets by mouth daily. 60 tablet 6  . meclizine (ANTIVERT) 25 MG tablet Take 1 tablet (25 mg total) by mouth 3 (three) times daily as needed for dizziness. 60 tablet 1  . mirtazapine (REMERON) 30 MG tablet Take 1 tablet (30 mg total) by mouth at bedtime. 30 tablet 6  . montelukast (SINGULAIR) 10 MG tablet Take 10 mg by mouth daily.     . Multiple  Vitamin (MULTIVITAMIN WITH MINERALS) TABS Take 1 tablet by mouth daily.    Marland Kitchen tiZANidine (ZANAFLEX) 4 MG tablet Take 1 tablet (4 mg total) by mouth every 8 (eight) hours as needed for muscle spasms. 90 tablet 6  . traMADol (ULTRAM) 50 MG tablet Take 1 tablet (50 mg total) by mouth at bedtime. 30 tablet 1  . VENTOLIN HFA 108 (90 Base) MCG/ACT inhaler INHALE 2 PUFFS INTO THE LUNGS EVERY 6 HOURS AS NEEDED 18 g 3  . megestrol (MEGACE) 20 MG tablet Take 1 tablet (20 mg total) by mouth daily. (Patient not taking: Reported on 02/08/2019) 30 tablet 0  . pantoprazole (PROTONIX) 40 MG tablet Take 1 tablet (40 mg total) by mouth daily for 30 days. 30 tablet 6  . polyethylene glycol powder (GLYCOLAX/MIRALAX) powder Take 17 g by mouth daily. (Patient not taking: Reported on 11/08/2018) 3350 g 1   No current facility-administered medications on file prior to visit.     Observations/Objective: Awake, alert, oriented x3 Not in acute distress  Assessment and Plan: 1. Lumbar radiculopathy Status post previous lumbar surgery Uncontrolled Continue Cymbalta, gabapentin, tizanidine Advised to apply heat  2. Cough due to ACE inhibitor Likely due to ACE inhibitor as she has repeatedly requested Tessalon Perles refills Discontinue lisinopril/HCTZ She also has underlying COPD and is on maintenance therapy If cough persists consider upgrading treatment  3. Essential hypertension Discontinue lisinopril/HCTZ due to cough Counseled on blood pressure goal of less than 130/80, low-sodium, DASH diet, medication compliance, 150 minutes of moderate intensity exercise per week. Discussed medication compliance, adverse effects. - amLODipine (NORVASC) 10 MG tablet; Take 1 tablet (10 mg total) by mouth daily.  Dispense: 90 tablet; Refill: 1 - carvedilol (COREG) 3.125 MG tablet; Take 1 tablet (3.125 mg total) by mouth 2 (two) times daily with a meal.  Dispense: 60 tablet; Refill: 3  4. Pruritus No rash evident during exam  at last visit She is on hydroxyzine Could be secondary to xerosis Advised him extremes along with regular lotion - triamcinolone cream (KENALOG) 0.1 %; Apply 1 application topically 2 (two) times daily.  Dispense: 30 g; Refill: 1   Follow Up Instructions: Return in about 3 months (around 05/10/2019).    I discussed the assessment and treatment plan with the patient. The patient was provided an opportunity to ask questions and all were answered. The patient agreed with the plan and demonstrated an understanding of the instructions.   The patient was advised to call back or seek an in-person evaluation if the symptoms worsen or if the condition fails to improve as anticipated.     I provided 25 minutes total of non-face-to-face time during this encounter including median intraservice time, reviewing previous notes, labs, imaging, medications and explaining diagnosis and management.     Hoy Register, MD, FAAFP. [facility] [place], [place] [phone]   02/08/2019, 3:47 PM

## 2019-02-09 ENCOUNTER — Other Ambulatory Visit: Payer: Self-pay | Admitting: Pharmacist

## 2019-02-09 DIAGNOSIS — L299 Pruritus, unspecified: Secondary | ICD-10-CM

## 2019-02-09 MED ORDER — TRIAMCINOLONE ACETONIDE 0.1 % EX CREA: 1.0000 "application " | TOPICAL_CREAM | Freq: Two times a day (BID) | CUTANEOUS | 0 refills | Status: DC

## 2019-02-09 MED FILL — ?TRIAMCINOLONE 0.1% CR: 0.1 | 30 days supply | Qty: 80 | Fill #0

## 2019-03-02 ENCOUNTER — Other Ambulatory Visit: Payer: Self-pay | Admitting: Family Medicine

## 2019-03-02 DIAGNOSIS — J449 Chronic obstructive pulmonary disease, unspecified: Secondary | ICD-10-CM

## 2019-03-02 DIAGNOSIS — L299 Pruritus, unspecified: Secondary | ICD-10-CM

## 2019-03-02 MED ORDER — TRIAMCINOLONE ACETONIDE 0.1 % EX CREA: 1.0000 "application " | TOPICAL_CREAM | Freq: Two times a day (BID) | CUTANEOUS | 0 refills | Status: DC

## 2019-03-02 MED FILL — tiZANidine HCL 4 MG TABS: 4 | 30 days supply | Qty: 90 | Fill #1

## 2019-03-02 MED FILL — $BREO ELLIPTA 100-25 MCG IH: 100-25 MCG | 90 days supply | Qty: 90 | Fill #0

## 2019-03-02 MED FILL — hydrOXYzine HCL 25 MG TABS: 25 | 30 days supply | Qty: 90 | Fill #2

## 2019-03-02 MED FILL — MIRTAZAPINE 30 MG TABLET: 30 | 30 days supply | Qty: 30 | Fill #1

## 2019-03-03 ENCOUNTER — Encounter: Payer: Self-pay | Admitting: Family Medicine

## 2019-03-05 ENCOUNTER — Other Ambulatory Visit: Payer: Self-pay | Admitting: Internal Medicine

## 2019-03-05 DIAGNOSIS — L299 Pruritus, unspecified: Secondary | ICD-10-CM

## 2019-03-08 MED ORDER — TRIAMCINOLONE ACETONIDE 0.1 % EX CREA: 1.0000 "application " | TOPICAL_CREAM | Freq: Two times a day (BID) | CUTANEOUS | 0 refills | Status: DC

## 2019-03-09 MED FILL — $VENTOLIN HFA 18G INHALER: 108 (90 BAS | 25 days supply | Qty: 18 | Fill #0

## 2019-03-09 MED FILL — TRIAMCINOLONE ACETONIDE 0.1: 0.1 | 25 days supply | Qty: 80 | Fill #0

## 2019-03-10 ENCOUNTER — Ambulatory Visit (HOSPITAL_COMMUNITY): Payer: Self-pay

## 2019-03-10 MED FILL — $VENTOLIN HFA 18G INHALER: 108 (90 BAS | 49 days supply | Qty: 36 | Fill #1

## 2019-03-31 ENCOUNTER — Telehealth: Payer: Self-pay | Admitting: Family Medicine

## 2019-03-31 NOTE — Telephone Encounter (Signed)
Patient called stating she has pain when she urinates and would like to get something for a bladder infection. Please follow up.

## 2019-03-31 NOTE — Telephone Encounter (Signed)
Patients call returned.  Patient identified by name and date of birth.  Patient complaining of burning and pain on urination.  Patient has been set up for and appointment in the am.    Patient advised that she would give a urine sample and then the Dr. Lynnda Child determine a course of action.  Patient acknowledged understanding of advice.

## 2019-03-31 NOTE — Telephone Encounter (Signed)
Patient would like to speak to you in regards to her cafa please follow up  °

## 2019-04-01 ENCOUNTER — Encounter: Payer: Self-pay | Admitting: Family Medicine

## 2019-04-01 ENCOUNTER — Ambulatory Visit: Payer: Self-pay | Attending: Family Medicine | Admitting: Family Medicine

## 2019-04-01 ENCOUNTER — Other Ambulatory Visit: Payer: Self-pay

## 2019-04-01 VITALS — BP 131/84 | HR 81 | Temp 98.1°F | Ht 61.0 in | Wt 104.6 lb

## 2019-04-01 DIAGNOSIS — N3001 Acute cystitis with hematuria: Secondary | ICD-10-CM

## 2019-04-01 DIAGNOSIS — R300 Dysuria: Secondary | ICD-10-CM

## 2019-04-01 LAB — POCT URINALYSIS DIP (CLINITEK)
Bilirubin, UA: NEGATIVE
Glucose, UA: 100 mg/dL — AB
Ketones, POC UA: NEGATIVE mg/dL
Nitrite, UA: POSITIVE — AB
POC PROTEIN,UA: NEGATIVE
Spec Grav, UA: 1.01
Urobilinogen, UA: 1 U/dL
pH, UA: 7

## 2019-04-01 MED ORDER — SULFAMETHOXAZOLE-TRIMETHOPRIM 800-160 MG PO TABS: 1.0000 | ORAL_TABLET | Freq: Two times a day (BID) | ORAL | 0 refills | Status: AC

## 2019-04-01 MED ORDER — PHENAZOPYRIDINE HCL 100 MG PO TABS: ORAL_TABLET | ORAL | 0 refills | Status: DC

## 2019-04-01 MED ORDER — BENZONATATE 100 MG PO CAPS: 100.0000 mg | ORAL_CAPSULE | Freq: Two times a day (BID) | ORAL | 0 refills | Status: DC | PRN

## 2019-04-01 MED FILL — BENZONATATE 100 MG CAPS: 100 | 10 days supply | Qty: 20 | Fill #0

## 2019-04-01 MED FILL — SULFAMETHOXAZOLE-TMP DS TAB: 800-160 | 5 days supply | Qty: 10 | Fill #0

## 2019-04-01 NOTE — Progress Notes (Signed)
Established Patient Office Visit  Subjective:  Patient ID: Tammy MacadamVerna S Schwartz, female    DOB: 06/23/1956  Age: 63 y.o. MRN: [id number]  CC: Pain with urination HPI Tammy Schwartz presents for complaint of 3 days of increased pain with urination/burning sensation.  Patient has also had increased urinary frequency with urgency and occasional incontinence due to not getting to the restroom in time when the urge hits to urinate.  She denies any fever or chills.  She has had some left-sided mid back pain and mild nausea.  She did start the use of Azo but still has continued pain with urination.  She also has some mild lower abdominal pressure with urination.  Past Medical History:  Diagnosis Date  . Chronic back pain    HNp,spondylosis,radiculopathy  . COPD (chronic obstructive pulmonary disease) (HCC)   . Hyperlipidemia    was on choloesterol meds last yr-samples given in office but not needed since  . Hypertension    takes Exforge daily  . Joint pain   . Shortness of breath    takes Singulair daily;with exertion  . Sickle cell trait (HCC)   . Spondylolysis   . Tobacco use     Past Surgical History:  Procedure Laterality Date  . BACK SURGERY    . BREAST EXCISIONAL BIOPSY Left 2007   benign  . BREAST SURGERY Left    lumpectomy  . COLONOSCOPY    . DILATION AND CURETTAGE OF UTERUS    . LUMBAR LAMINECTOMY/DECOMPRESSION MICRODISCECTOMY  06/04/2012   Procedure: LUMBAR LAMINECTOMY/DECOMPRESSION MICRODISCECTOMY 1 LEVEL;  Surgeon: Carmela HurtKyle L Cabbell, MD;  Location: [facility];  Service: Neurosurgery;  Laterality: Left;  LEFT Lumbar five-sacral one diskectomy  . LUMBAR LAMINECTOMY/DECOMPRESSION MICRODISCECTOMY Right 06/16/2013   Procedure: RIGHT Lumbar Five-Sacral One Microdiskectomy;  Surgeon: Carmela HurtKyle L Cabbell, MD;  Location: [facility];  Service: Neurosurgery;  Laterality: Right;  RIGHT Lumbar Five-Sacral One Microdiskectomy  . LUMBAR LAMINECTOMY/DECOMPRESSION MICRODISCECTOMY Right  01/20/2014   Procedure: LUMBAR FOUR TO LUMBAR FIVE LUMBAR LAMINECTOMY/DECOMPRESSION MICRODISCECTOMY 1 LEVEL;  Surgeon: Carmela HurtKyle L Cabbell, MD;  Location: [facility];  Service: Neurosurgery;  Laterality: Right;  Right L45 laminectomy and foramintomy    Family History  Problem Relation Age of Onset  . Cancer - Other Mother   . CAD Father        Died age 64  . Hypertension Sister     Social History   Socioeconomic History  . Marital status: Married    Spouse name: Not on file  . Number of children: Not on file  . Years of education: Not on file  . Highest education level: Not on file  Occupational History  . Not on file  Social Needs  . Financial resource strain: Not on file  . Food insecurity    Worry: Not on file    Inability: Not on file  . Transportation needs    Medical: Not on file    Non-medical: Not on file  Tobacco Use  . Smoking status: Current Every Day Smoker    Packs/day: 0.25    Years: 20.00    Pack years: 5.00    Types: Cigarettes  . Smokeless tobacco: Never Used  Substance and Sexual Activity  . Alcohol use: No  . Drug use: No  . Sexual activity: Yes    Birth control/protection: Post-menopausal  Lifestyle  . Physical activity    Days per week: 0 days    Minutes per session: 0 min  .  Stress: Only a little  Relationships  . Social connections    Talks on phone: More than three times a week    Gets together: Once a week    Attends religious service: More than 4 times per year    Active member of club or organization: No    Attends meetings of clubs or organizations: Never    Relationship status: Married  . Intimate partner violence    Fear of current or ex partner: No    Emotionally abused: No    Physically abused: No    Forced sexual activity: No  Other Topics Concern  . Not on file  Social History Narrative   Lives with husband and son.     Outpatient Medications Prior to Visit  Medication Sig Dispense Refill  . amLODipine (NORVASC) 10 MG  tablet Take 1 tablet (10 mg total) by mouth daily. 90 tablet 1  . aspirin EC 81 MG tablet Take 162 mg by mouth daily.    . benzonatate (TESSALON) 100 MG capsule Take 1 capsule (100 mg total) by mouth 2 (two) times daily as needed for cough. 20 capsule 0  . carvedilol (COREG) 3.125 MG tablet Take 1 tablet (3.125 mg total) by mouth 2 (two) times daily with a meal. 60 tablet 3  . cetirizine (ZYRTEC) 10 MG tablet Take 1 tablet (10 mg total) by mouth daily. 30 tablet 11  . chlorhexidine (HIBICLENS) 4 % external liquid Apply topically daily as needed. Dilute 10-15 mL in water, Use daily when bathing for 1-2 weeks 120 mL 0  . DULoxetine (CYMBALTA) 60 MG capsule Take 1 capsule (60 mg total) by mouth daily. 30 capsule 6  . fluticasone (FLONASE) 50 MCG/ACT nasal spray Place 1 spray into both nostrils daily. 16 g 0  . fluticasone furoate-vilanterol (BREO ELLIPTA) 100-25 MCG/INH AEPB Inhale 1 puff into the lungs daily. 90 each 3  . gabapentin (NEURONTIN) 300 MG capsule Take 2 capsules (600 mg total) by mouth 3 (three) times daily. 180 capsule 3  . hydrOXYzine (ATARAX/VISTARIL) 25 MG tablet Take 1 tablet (25 mg total) by mouth every 8 (eight) hours as needed. 90 tablet 2  . lactulose (CHRONULAC) 10 GM/15ML solution Take 15 mLs (10 g total) by mouth 2 (two) times daily as needed for mild constipation. 946 mL 0  . meclizine (ANTIVERT) 25 MG tablet Take 1 tablet (25 mg total) by mouth 3 (three) times daily as needed for dizziness. 60 tablet 1  . mirtazapine (REMERON) 30 MG tablet Take 1 tablet (30 mg total) by mouth at bedtime. 30 tablet 6  . montelukast (SINGULAIR) 10 MG tablet Take 10 mg by mouth daily.     . Multiple Vitamin (MULTIVITAMIN WITH MINERALS) TABS Take 1 tablet by mouth daily.    . pantoprazole (PROTONIX) 40 MG tablet Take 1 tablet (40 mg total) by mouth daily for 30 days. 30 tablet 6  . polyethylene glycol powder (GLYCOLAX/MIRALAX) powder Take 17 g by mouth daily. (Patient not taking: Reported on  11/08/2018) 3350 g 1  . tiZANidine (ZANAFLEX) 4 MG tablet Take 1 tablet (4 mg total) by mouth every 8 (eight) hours as needed for muscle spasms. 90 tablet 6  . traMADol (ULTRAM) 50 MG tablet Take 1 tablet (50 mg total) by mouth at bedtime. 30 tablet 1  . triamcinolone cream (KENALOG) 0.1 % Apply 1 application topically 2 (two) times daily. 80 g 0  . VENTOLIN HFA 108 (90 Base) MCG/ACT inhaler INHALE 2 PUFFS INTO THE  LUNGS EVERY 6 HOURS AS NEEDED 18 g 2   No facility-administered medications prior to visit.     No Known Allergies  ROS Review of Systems  Constitutional: Positive for fatigue. Negative for chills and fever.  HENT: Negative for sore throat and trouble swallowing.   Respiratory: Positive for cough (Occasional, she would like refill of Tessalon Perls).   Cardiovascular: Negative for chest pain, palpitations and leg swelling.  Gastrointestinal: Positive for abdominal pain (Lower abdominal pressure) and nausea. Negative for constipation, diarrhea and vomiting.  Endocrine: Negative for polydipsia, polyphagia and polyuria.  Genitourinary: Positive for dysuria, frequency and urgency.  Musculoskeletal: Positive for back pain. Negative for gait problem.  Neurological: Negative for dizziness and headaches.  Hematological: Negative for adenopathy. Does not bruise/bleed easily.      Objective:    Physical Exam  Constitutional: She is oriented to person, place, and time. She appears well-developed and well-nourished.  Thin framed older female with mild exophthalmic appearance to the eyes who is sitting on the exam table in no acute distress  Cardiovascular: Normal rate and regular rhythm.  Pulmonary/Chest: Effort normal and breath sounds normal.  Abdominal: Soft. There is abdominal tenderness (Mild suprapubic discomfort to palpation). There is no rebound and no guarding.  Musculoskeletal:        General: Tenderness (Mild left CVA discomfort) present. No edema.  Neurological: She is  alert and oriented to person, place, and time.  Skin: Skin is warm and dry.  Psychiatric: She has a normal mood and affect. Her behavior is normal.  Nursing note and vitals reviewed.   LMP 03/17/2012  Wt Readings from Last 3 Encounters:  11/08/18 108 lb (49 kg)  07/14/18 115 lb 12.8 oz (52.5 kg)  07/07/18 115 lb 15.4 oz (52.6 kg)     There are no preventive care reminders to display for this patient.  There are no preventive care reminders to display for this patient.  Lab Results  Component Value Date   TSH 1.706 06/21/2018   Lab Results  Component Value Date   WBC 9.1 07/07/2018   HGB 12.3 07/07/2018   HCT 36.5 07/07/2018   MCV 89.0 07/07/2018   PLT 281 07/07/2018   Lab Results  Component Value Date   NA 144 08/26/2018   K 3.5 08/26/2018   CO2 22 08/26/2018   GLUCOSE 80 08/26/2018   BUN 5 (L) 08/26/2018   CREATININE 0.80 08/26/2018   BILITOT 0.5 06/21/2018   ALKPHOS 60 06/21/2018   AST 24 06/21/2018   ALT 21 06/21/2018   PROT 7.8 06/21/2018   ALBUMIN 4.3 06/21/2018   CALCIUM 9.5 08/26/2018   ANIONGAP 8 07/08/2018   Lab Results  Component Value Date   CHOL 204 (H) 01/01/2018   Lab Results  Component Value Date   HDL 102 01/01/2018   Lab Results  Component Value Date   LDLCALC 84 01/01/2018   Lab Results  Component Value Date   TRIG 92 01/01/2018   Lab Results  Component Value Date   CHOLHDL 2.0 01/01/2018   No results found for: HGBA1C    Assessment & Plan:  1. Dysuria; 2.  Acute cystitis with hematuria Patient with complaint of today being the third day of pain with urination/dysuria.  Patient will have urinalysis to look for possible urinary tract infection.  Urinalysis was positive for leukocytes, nitrites as well as blood, trace.  Patient will be based on Pyridium 100 mg 3 times daily for 2 days to help with pain  with urination as well as prescription for Bactrim DS 1 twice daily for 5 days for treatment of UTI.  She should rest and remain  well-hydrated.  Return next week if symptoms have not improved or worsen.  Information on urinary tract infections given as part of after visit summary/AVS. - POCT URINALYSIS DIP (CLINITEK) - phenazopyridine (PYRIDIUM) 100 MG tablet; One pill three times per day for 2 days to decrease pain  Dispense: 6 tablet; Refill: 0 - sulfamethoxazole-trimethoprim (BACTRIM DS) 800-160 MG tablet; Take 1 tablet by mouth 2 (two) times daily for 5 days.  Dispense: 10 tablet; Refill: 0  An After Visit Summary was printed and given to the patient.  Follow-up: Return if symptoms worsen or fail to improve, for keep scheduled follow-up with your PCP; return in 1-2 weeks if your symptoms are not better.   Cain Saupeammie Beecher Furio, MD

## 2019-04-01 NOTE — Patient Instructions (Signed)
Urinary Tract Infection, Adult A urinary tract infection (UTI) is an infection of any part of the urinary tract. The urinary tract includes:  The kidneys.  The ureters.  The bladder.  The urethra. These organs make, store, and get rid of pee (urine) in the body. What are the causes? This is caused by germs (bacteria) in your genital area. These germs grow and cause swelling (inflammation) of your urinary tract. What increases the risk? You are more likely to develop this condition if:  You have a small, thin tube (catheter) to drain pee.  You cannot control when you pee or poop (incontinence).  You are female, and: ? You use these methods to prevent pregnancy: ? A medicine that kills sperm (spermicide). ? A device that blocks sperm (diaphragm). ? You have low levels of a female hormone (estrogen). ? You are pregnant.  You have genes that add to your risk.  You are sexually active.  You take antibiotic medicines.  You have trouble peeing because of: ? A prostate that is bigger than normal, if you are female. ? A blockage in the part of your body that drains pee from the bladder (urethra). ? A kidney stone. ? A nerve condition that affects your bladder (neurogenic bladder). ? Not getting enough to drink. ? Not peeing often enough.  You have other conditions, such as: ? Diabetes. ? A weak disease-fighting system (immune system). ? Sickle cell disease. ? Gout. ? Injury of the spine. What are the signs or symptoms? Symptoms of this condition include:  Needing to pee right away (urgently).  Peeing often.  Peeing small amounts often.  Pain or burning when peeing.  Blood in the pee.  Pee that smells bad or not like normal.  Trouble peeing.  Pee that is cloudy.  Fluid coming from the vagina, if you are female.  Pain in the belly or lower back. Other symptoms include:  Throwing up (vomiting).  No urge to eat.  Feeling mixed up (confused).  Being tired  and grouchy (irritable).  A fever.  Watery poop (diarrhea). How is this treated? This condition may be treated with:  Antibiotic medicine.  Other medicines.  Drinking enough water. Follow these instructions at home:  Medicines  Take over-the-counter and prescription medicines only as told by your doctor.  If you were prescribed an antibiotic medicine, take it as told by your doctor. Do not stop taking it even if you start to feel better. General instructions  Make sure you: ? Pee until your bladder is empty. ? Do not hold pee for a long time. ? Empty your bladder after sex. ? Wipe from front to back after pooping if you are a female. Use each tissue one time when you wipe.  Drink enough fluid to keep your pee pale yellow.  Keep all follow-up visits as told by your doctor. This is important. Contact a doctor if:  You do not get better after 1-2 days.  Your symptoms go away and then come back. Get help right away if:  You have very bad back pain.  You have very bad pain in your lower belly.  You have a fever.  You are sick to your stomach (nauseous).  You are throwing up. Summary  A urinary tract infection (UTI) is an infection of any part of the urinary tract.  This condition is caused by germs in your genital area.  There are many risk factors for a UTI. These include having a small, thin   tube to drain pee and not being able to control when you pee or poop.  Treatment includes antibiotic medicines for germs.  Drink enough fluid to keep your pee pale yellow. This information is not intended to replace advice given to you by your health care provider. Make sure you discuss any questions you have with your health care provider. Document Released: 03/17/2008 Document Revised: 04/08/2018 Document Reviewed: 04/08/2018 Elsevier Interactive Patient Education  2019 [employer]  

## 2019-04-01 NOTE — Progress Notes (Signed)
Hurt when she pees and itches, per pt she took Azo last night to stop the itch,  Need refills for her cough meds

## 2019-04-01 NOTE — Telephone Encounter (Signed)
I called Pt [facility] to informed her that CAF exp 05/04/19 and she does not need to apply yet until it expired

## 2019-04-19 ENCOUNTER — Other Ambulatory Visit: Payer: Self-pay | Admitting: Family Medicine

## 2019-04-19 DIAGNOSIS — L299 Pruritus, unspecified: Secondary | ICD-10-CM

## 2019-04-19 NOTE — Telephone Encounter (Signed)
Refill if appropriate .

## 2019-04-21 MED ORDER — TRIAMCINOLONE ACETONIDE 0.1 % EX CREA: 1.0000 "application " | TOPICAL_CREAM | Freq: Two times a day (BID) | CUTANEOUS | 0 refills | Status: DC

## 2019-04-21 MED FILL — TRIAMCINOLONE ACETONIDE 0.1: 0.1 | 14 days supply | Qty: 80 | Fill #0

## 2019-04-25 ENCOUNTER — Other Ambulatory Visit: Payer: Self-pay | Admitting: Family Medicine

## 2019-04-25 DIAGNOSIS — F419 Anxiety disorder, unspecified: Secondary | ICD-10-CM

## 2019-04-25 MED FILL — ?MIRTAZAPINE 30 MG TABLET: 30 | 30 days supply | Qty: 30 | Fill #2

## 2019-04-25 MED FILL — hydrOXYzine HCL 25 MG TABS: 25 | 30 days supply | Qty: 90 | Fill #0

## 2019-04-26 ENCOUNTER — Telehealth: Payer: Self-pay | Admitting: Family Medicine

## 2019-04-26 ENCOUNTER — Encounter: Payer: Self-pay | Admitting: Family Medicine

## 2019-04-26 NOTE — Telephone Encounter (Signed)
Please address patients concern if a sample can be dropped or medication prescribed.

## 2019-04-26 NOTE — Telephone Encounter (Signed)
Pt requesting to come in for urine lab for unresolved symptoms. Pt MyChart message also sent earlier. Please advise [phone].

## 2019-04-26 NOTE — Telephone Encounter (Signed)
In person

## 2019-04-26 NOTE — Telephone Encounter (Signed)
Patient saw fulp on 6/19 for the same issue. Tele or in person or should patient just drop of specimen.

## 2019-04-27 ENCOUNTER — Ambulatory Visit (HOSPITAL_BASED_OUTPATIENT_CLINIC_OR_DEPARTMENT_OTHER): Payer: Self-pay | Admitting: Family Medicine

## 2019-04-27 ENCOUNTER — Other Ambulatory Visit: Payer: Self-pay

## 2019-04-27 ENCOUNTER — Encounter: Payer: Self-pay | Admitting: Family Medicine

## 2019-04-27 ENCOUNTER — Ambulatory Visit: Payer: Self-pay | Attending: Family Medicine

## 2019-04-27 VITALS — BP 127/78 | HR 88 | Ht 61.0 in | Wt 100.8 lb

## 2019-04-27 DIAGNOSIS — N3001 Acute cystitis with hematuria: Secondary | ICD-10-CM

## 2019-04-27 LAB — POCT URINALYSIS DIP (CLINITEK)
Glucose, UA: NEGATIVE mg/dL
Ketones, POC UA: NEGATIVE mg/dL
Nitrite, UA: POSITIVE — AB
POC PROTEIN,UA: 30 — AB
Spec Grav, UA: 1.02 (ref 1.010–1.025)
Urobilinogen, UA: 0.2 E.U./dL
pH, UA: 6.5 (ref 5.0–8.0)

## 2019-04-27 MED ORDER — CEPHALEXIN 500 MG PO CAPS: 500.0000 mg | ORAL_CAPSULE | Freq: Two times a day (BID) | ORAL | 0 refills | Status: DC

## 2019-04-27 MED FILL — CEPHALEXIN 500 MG CAPSULE: 500 | 7 days supply | Qty: 14 | Fill #0

## 2019-04-27 NOTE — Progress Notes (Signed)
Patient is having pelvic pain when she urinates.

## 2019-04-27 NOTE — Progress Notes (Signed)
Subjective:  Patient ID: Tammy Schwartz, female    DOB: 08/12/1956  Age: 63 y.o. MRN: [id number]  CC: Urinary Tract Infection   HPI Tammy MacadamVerna S Gaede  is a 63 year old female with history of hypertension, tobacco abuse, COPD, lumbar spondylosis status post back surgery who presents today with complaints of dysuria, lower abdominal pressure similar to her UTI symptoms which she had last month. She was treated with Bactrim at the time and Pyridium. She denies fever, nausea or vomiting.  Also endorses flank pain.  Past Medical History:  Diagnosis Date  . Chronic back pain    HNp,spondylosis,radiculopathy  . COPD (chronic obstructive pulmonary disease) (HCC)   . Hyperlipidemia    was on choloesterol meds last yr-samples given in office but not needed since  . Hypertension    takes Exforge daily  . Joint pain   . Shortness of breath    takes Singulair daily;with exertion  . Sickle cell trait (HCC)   . Spondylolysis   . Tobacco use     Past Surgical History:  Procedure Laterality Date  . BACK SURGERY    . BREAST EXCISIONAL BIOPSY Left 2007   benign  . BREAST SURGERY Left    lumpectomy  . COLONOSCOPY    . DILATION AND CURETTAGE OF UTERUS    . LUMBAR LAMINECTOMY/DECOMPRESSION MICRODISCECTOMY  06/04/2012   Procedure: LUMBAR LAMINECTOMY/DECOMPRESSION MICRODISCECTOMY 1 LEVEL;  Surgeon: Carmela HurtKyle L Cabbell, MD;  Location: [facility];  Service: Neurosurgery;  Laterality: Left;  LEFT Lumbar five-sacral one diskectomy  . LUMBAR LAMINECTOMY/DECOMPRESSION MICRODISCECTOMY Right 06/16/2013   Procedure: RIGHT Lumbar Five-Sacral One Microdiskectomy;  Surgeon: Carmela HurtKyle L Cabbell, MD;  Location: [facility];  Service: Neurosurgery;  Laterality: Right;  RIGHT Lumbar Five-Sacral One Microdiskectomy  . LUMBAR LAMINECTOMY/DECOMPRESSION MICRODISCECTOMY Right 01/20/2014   Procedure: LUMBAR FOUR TO LUMBAR FIVE LUMBAR LAMINECTOMY/DECOMPRESSION MICRODISCECTOMY 1 LEVEL;  Surgeon: Carmela HurtKyle L Cabbell, MD;  Location:  [facility];  Service: Neurosurgery;  Laterality: Right;  Right L45 laminectomy and foramintomy    Family History  Problem Relation Age of Onset  . Cancer - Other Mother   . CAD Father        Died age 64  . Hypertension Sister     No Known Allergies  Outpatient Medications Prior to Visit  Medication Sig Dispense Refill  . amLODipine (NORVASC) 10 MG tablet Take 1 tablet (10 mg total) by mouth daily. 90 tablet 1  . aspirin EC 81 MG tablet Take 162 mg by mouth daily.    . benzonatate (TESSALON) 100 MG capsule Take 1 capsule (100 mg total) by mouth 2 (two) times daily as needed for cough. 20 capsule 0  . carvedilol (COREG) 3.125 MG tablet Take 1 tablet (3.125 mg total) by mouth 2 (two) times daily with a meal. 60 tablet 3  . cetirizine (ZYRTEC) 10 MG tablet Take 1 tablet (10 mg total) by mouth daily. 30 tablet 11  . chlorhexidine (HIBICLENS) 4 % external liquid Apply topically daily as needed. Dilute 10-15 mL in water, Use daily when bathing for 1-2 weeks 120 mL 0  . DULoxetine (CYMBALTA) 60 MG capsule Take 1 capsule (60 mg total) by mouth daily. 30 capsule 6  . fluticasone (FLONASE) 50 MCG/ACT nasal spray Place 1 spray into both nostrils daily. 16 g 0  . fluticasone furoate-vilanterol (BREO ELLIPTA) 100-25 MCG/INH AEPB Inhale 1 puff into the lungs daily. 90 each 3  . gabapentin (NEURONTIN) 300 MG capsule Take 2 capsules (600 mg total)  by mouth 3 (three) times daily. 180 capsule 3  . hydrOXYzine (ATARAX/VISTARIL) 25 MG tablet TAKE 1 TABLET (25 MG TOTAL) BY MOUTH EVERY 8 (EIGHT) HOURS AS NEEDED. 90 tablet 2  . lactulose (CHRONULAC) 10 GM/15ML solution Take 15 mLs (10 g total) by mouth 2 (two) times daily as needed for mild constipation. 946 mL 0  . meclizine (ANTIVERT) 25 MG tablet Take 1 tablet (25 mg total) by mouth 3 (three) times daily as needed for dizziness. 60 tablet 1  . mirtazapine (REMERON) 30 MG tablet Take 1 tablet (30 mg total) by mouth at bedtime. 30 tablet 6  . montelukast  (SINGULAIR) 10 MG tablet Take 10 mg by mouth daily.     . Multiple Vitamin (MULTIVITAMIN WITH MINERALS) TABS Take 1 tablet by mouth daily.    . pantoprazole (PROTONIX) 40 MG tablet Take 1 tablet (40 mg total) by mouth daily for 30 days. 30 tablet 6  . phenazopyridine (PYRIDIUM) 100 MG tablet One pill three times per day for 2 days to decrease pain 6 tablet 0  . polyethylene glycol powder (GLYCOLAX/MIRALAX) powder Take 17 g by mouth daily. 3350 g 1  . tiZANidine (ZANAFLEX) 4 MG tablet Take 1 tablet (4 mg total) by mouth every 8 (eight) hours as needed for muscle spasms. 90 tablet 6  . traMADol (ULTRAM) 50 MG tablet Take 1 tablet (50 mg total) by mouth at bedtime. 30 tablet 1  . triamcinolone cream (KENALOG) 0.1 % Apply 1 application topically 2 (two) times daily. 80 g 0  . VENTOLIN HFA 108 (90 Base) MCG/ACT inhaler INHALE 2 PUFFS INTO THE LUNGS EVERY 6 HOURS AS NEEDED 18 g 2   No facility-administered medications prior to visit.      ROS Review of Systems  Constitutional: Negative for activity change, appetite change and fatigue.  HENT: Negative for congestion, sinus pressure and sore throat.   Eyes: Negative for visual disturbance.  Respiratory: Negative for cough, chest tightness, shortness of breath and wheezing.   Cardiovascular: Negative for chest pain and palpitations.  Gastrointestinal: Negative for abdominal distention, abdominal pain and constipation.  Endocrine: Negative for polydipsia.  Genitourinary: Negative for dysuria and frequency.  Musculoskeletal: Negative for arthralgias and back pain.  Skin: Negative for rash.  Neurological: Negative for tremors, light-headedness and numbness.  Hematological: Does not bruise/bleed easily.  Psychiatric/Behavioral: Negative for agitation and behavioral problems.    Objective:  BP 127/78   Pulse 88   Ht 5\' 1"  (1.549 m)   Wt 100 lb 12.8 oz (45.7 kg)   LMP 03/17/2012   SpO2 98%   BMI 19.05 kg/m   BP/Weight 04/27/2019 04/01/2019  12/29/2018  Systolic BP 127 131 144  Diastolic BP 78 84 80  Wt. (Lbs) 100.8 104.6 -  BMI 19.05 19.76 -      Physical Exam Constitutional: normal appearing,  Eyes: PERRLA HEENT: Head is atraumatic, normal sinuses, normal oropharynx, normal appearing tonsils and palate, tympanic membrane is normal bilaterally. Neck: normal range of motion, no thyromegaly, no JVD Cardiovascular: normal rate and rhythm, normal heart sounds, no murmurs, rub or gallop, no pedal edema Respiratory: Normal breath sounds, clear to auscultation bilaterally, no wheezes, no rales, no rhonchi Abdomen: soft, not tender to palpation, normal bowel sounds, no enlarged organs Musculoskeletal: Full ROM, no tenderness in joints; negative CVA tenderness bilaterally Skin: warm and dry, no lesions. Neurological: alert, oriented x3, cranial nerves I-XII grossly intact , normal motor strength, normal sensation. Psychological: normal mood.   CMP Latest  Ref Rng & Units 08/26/2018 07/08/2018 07/07/2018  Glucose 65 - 99 mg/dL 80 115(H) -  BUN 8 - 27 mg/dL 5(L) 8 -  Creatinine 0.57 - 1.00 mg/dL 0.80 0.91 0.97  Sodium 134 - 144 mmol/L 144 145 -  Potassium 3.5 - 5.2 mmol/L 3.5 4.6 -  Chloride 96 - 106 mmol/L 103 109 -  CO2 20 - 29 mmol/L 22 28 -  Calcium 8.7 - 10.3 mg/dL 9.5 8.8(L) -  Total Protein 6.5 - 8.1 g/dL - - -  Total Bilirubin 0.3 - 1.2 mg/dL - - -  Alkaline Phos 38 - 126 U/L - - -  AST 15 - 41 U/L - - -  ALT 0 - 44 U/L - - -    Lipid Panel     Component Value Date/Time   CHOL 204 (H) 01/01/2018 1223   TRIG 92 01/01/2018 1223   HDL 102 01/01/2018 1223   CHOLHDL 2.0 01/01/2018 1223   CHOLHDL 2.3 05/07/2016 0739   VLDL 20 05/07/2016 0739   LDLCALC 84 01/01/2018 1223    CBC    Component Value Date/Time   WBC 9.1 07/07/2018 1813   RBC 4.10 07/07/2018 1813   HGB 12.3 07/07/2018 1813   HGB 12.7 06/09/2017 1446   HCT 36.5 07/07/2018 1813   HCT 38.4 06/09/2017 1446   PLT 281 07/07/2018 1813   PLT 254  06/09/2017 1446   MCV 89.0 07/07/2018 1813   MCV 87 06/09/2017 1446   MCH 30.0 07/07/2018 1813   MCHC 33.7 07/07/2018 1813   RDW 16.2 (H) 07/07/2018 1813   RDW 15.8 (H) 06/09/2017 1446   LYMPHSABS 2.1 05/20/2018 0457   LYMPHSABS 2.4 06/09/2017 1446   MONOABS 0.4 05/20/2018 0457   EOSABS 0.3 05/20/2018 0457   EOSABS 0.2 06/09/2017 1446   BASOSABS 0.0 05/20/2018 0457   BASOSABS 0.0 06/09/2017 1446    No results found for: HGBA1C  Assessment & Plan:   1. Acute cystitis with hematuria Completed course of Bactrim 1 month ago UA positive for leukocyte esterase, nitrites and blood We will treat with Keflex and send off urine culture - POCT URINALYSIS DIP (CLINITEK) - Urine Culture - cephALEXin (KEFLEX) 500 MG capsule; Take 1 capsule (500 mg total) by mouth 2 (two) times daily.  Dispense: 14 capsule; Refill: 0     Meds ordered this encounter  Medications  . cephALEXin (KEFLEX) 500 MG capsule    Sig: Take 1 capsule (500 mg total) by mouth 2 (two) times daily.    Dispense:  14 capsule    Refill:  0    Follow-up: Return in about 3 months (around 07/28/2019) for medical conditions.       Charlott Rakes, MD, FAAFP. [facility] and [place], [place]   04/27/2019, 3:48 PM

## 2019-04-27 NOTE — Telephone Encounter (Signed)
Patient will be in office today at 245 to drop off specimen.

## 2019-04-29 ENCOUNTER — Encounter: Payer: Self-pay | Admitting: Family Medicine

## 2019-04-29 LAB — URINE CULTURE

## 2019-05-02 ENCOUNTER — Ambulatory Visit: Payer: Self-pay

## 2019-05-10 ENCOUNTER — Ambulatory Visit: Payer: Self-pay | Attending: Family Medicine

## 2019-05-10 ENCOUNTER — Other Ambulatory Visit: Payer: Self-pay

## 2019-05-15 IMAGING — CT CT ANGIO CHEST
2 of 6 series · 19 of 46 positions shown · IV contrast (ISOVUE)
Comparison: Chest radiograph dated 07/06/2018

CLINICAL DATA: 62-year-old female with shortness of breath.

EXAM:
CT ANGIOGRAPHY CHEST WITH CONTRAST
TECHNIQUE: Multidetector CT imaging of the chest was performed using the
standard protocol during bolus administration of intravenous
contrast. Multiplanar CT image reconstructions and MIPs were
obtained to evaluate the vascular anatomy.
CONTRAST:  100mL [id number]-[id number] IOPAMIDOL ([id number]) INJECTION 76%

[Series 5: thins · axial · 0.53mm/px · z∈[-235,+44]mm · 16 of 307 slices shown]
[im 14/307  lung]
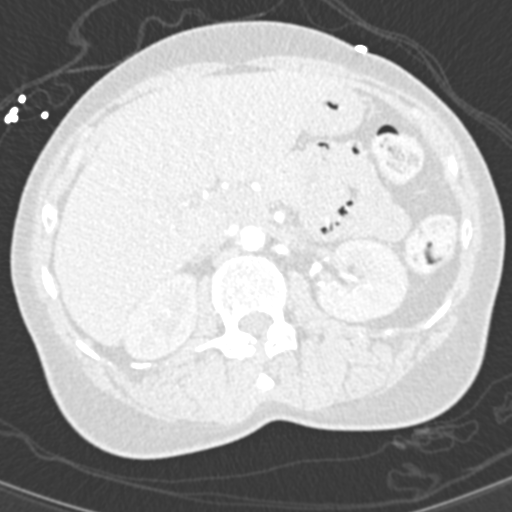
[im 40/307  soft-tissue]
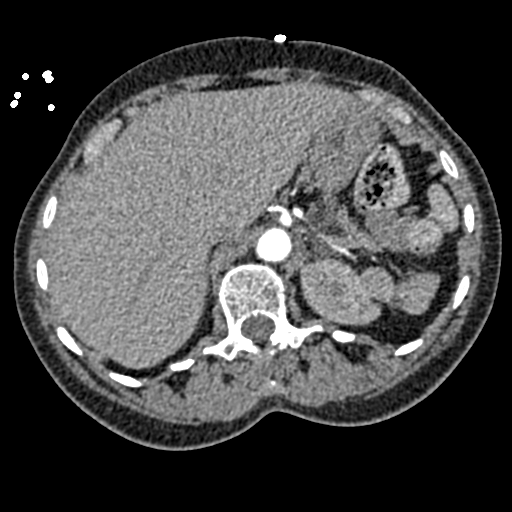
[im 54/307  lung]
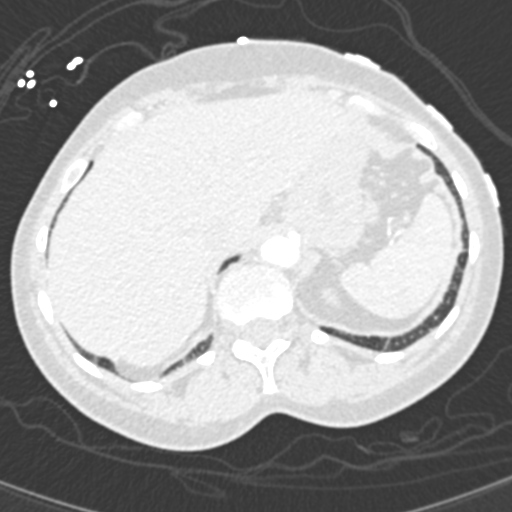
[im 67/307  soft-tissue]
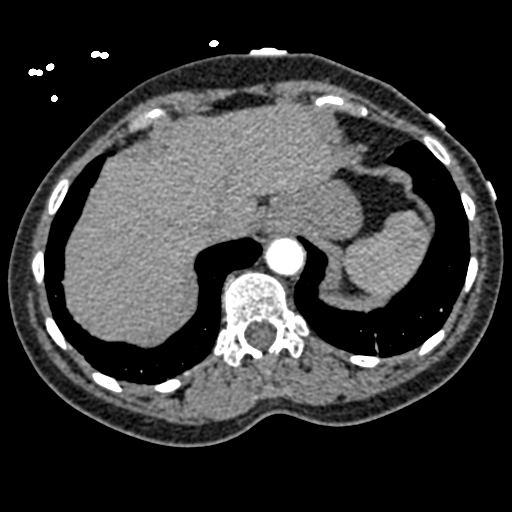
[im 94/307  lung]
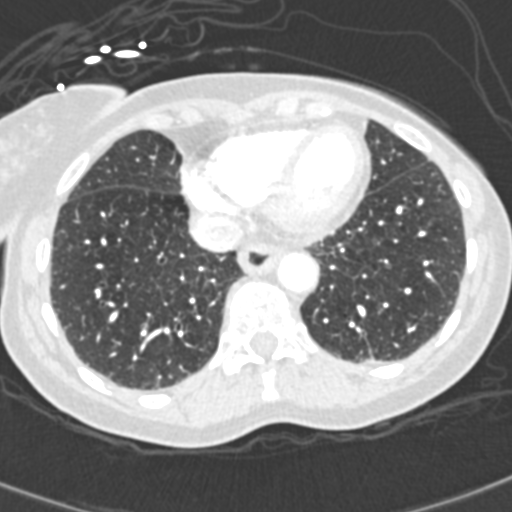
[im 107/307  soft-tissue]
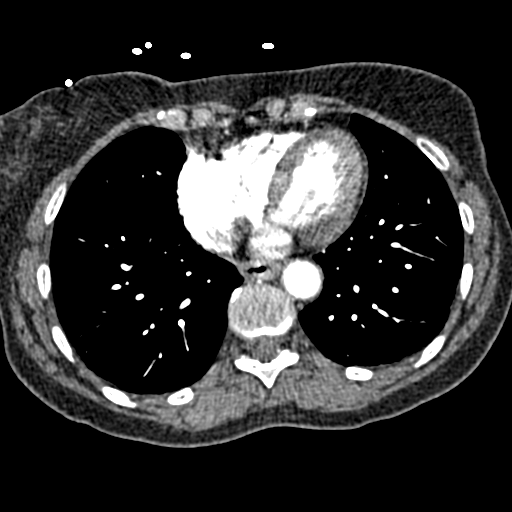
[im 120/307  lung]
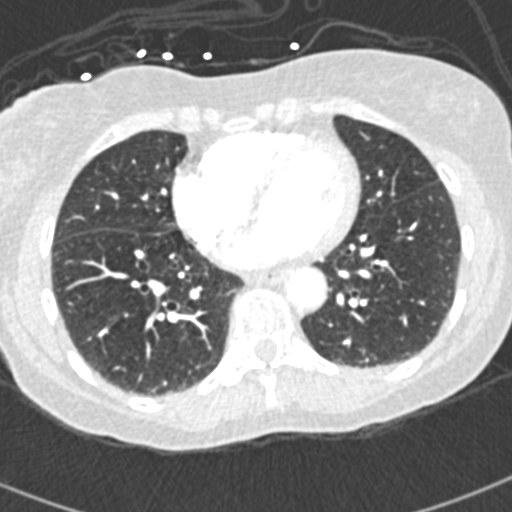
[im 147/307  soft-tissue]
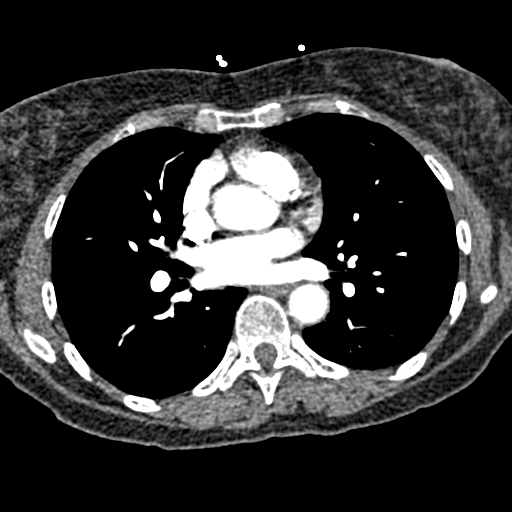
[im 160/307  lung]
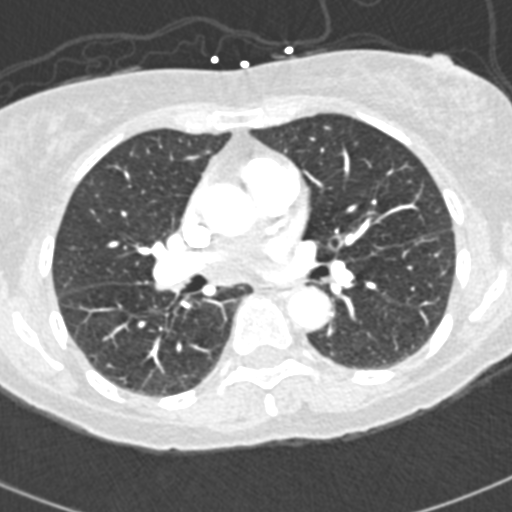
[im 187/307  soft-tissue]
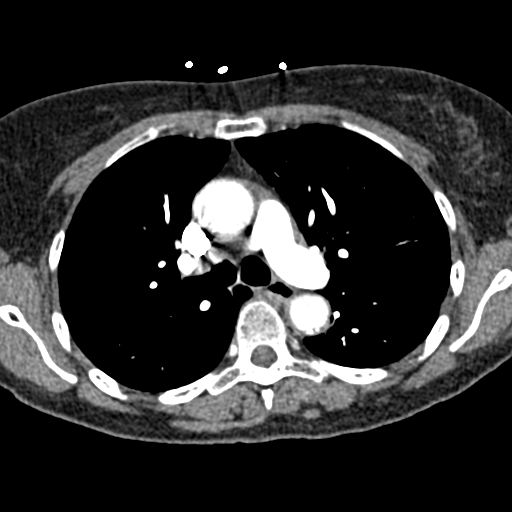
[im 200/307  lung]
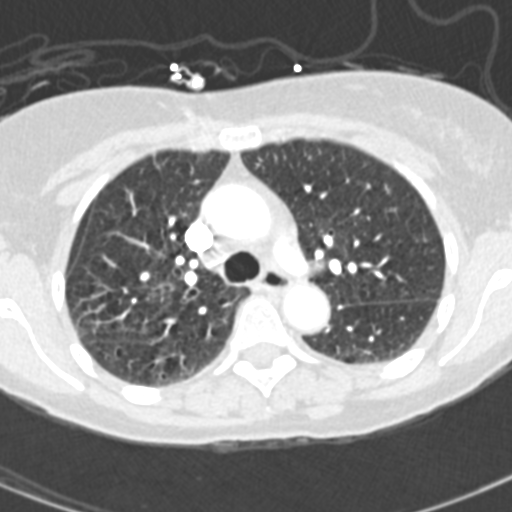
[im 213/307  soft-tissue]
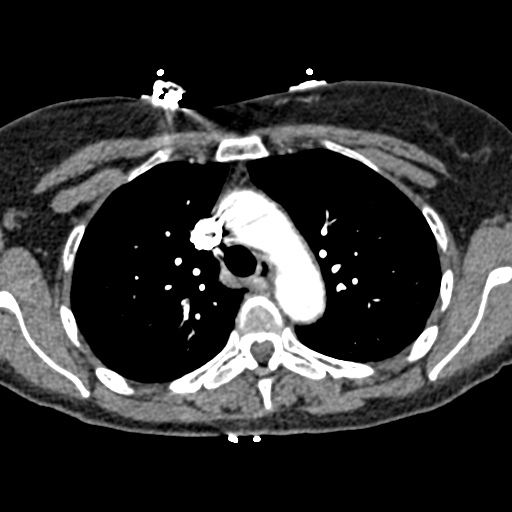
[im 240/307  lung]
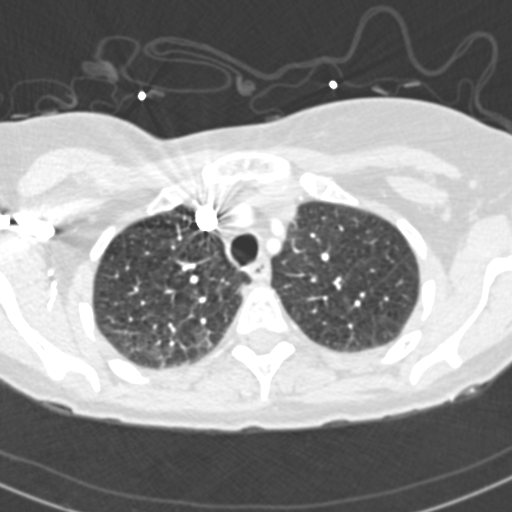
[im 253/307  soft-tissue]
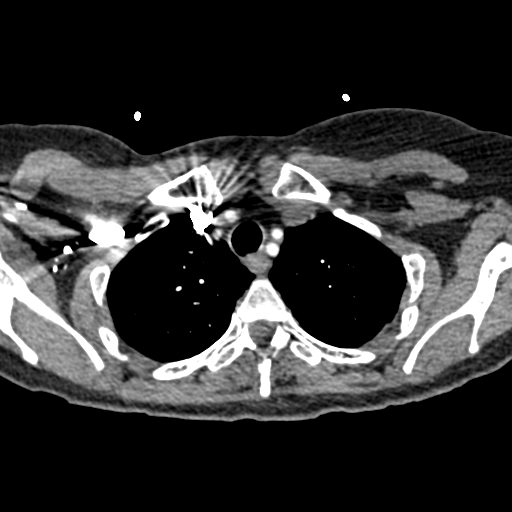
[im 267/307  lung]
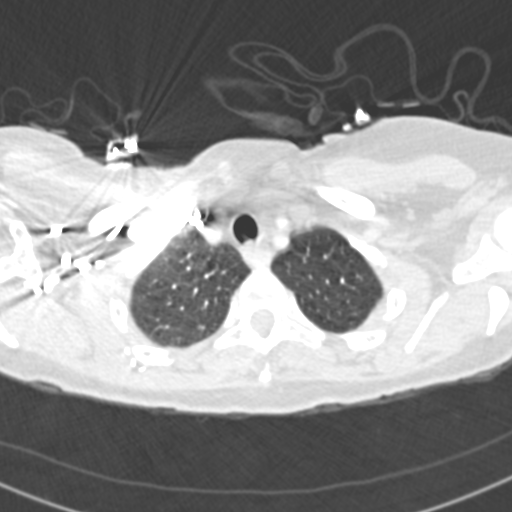
[im 293/307  soft-tissue]
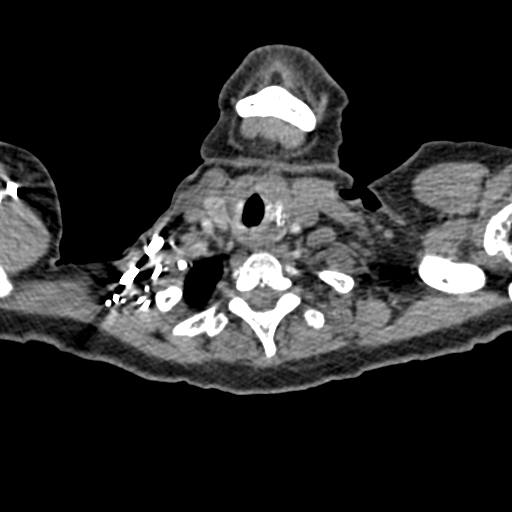

[Series 6: coronal mpr · coronal · 0.54mm/px · 3 of 98 slices shown]
[im 25/98  soft-tissue]
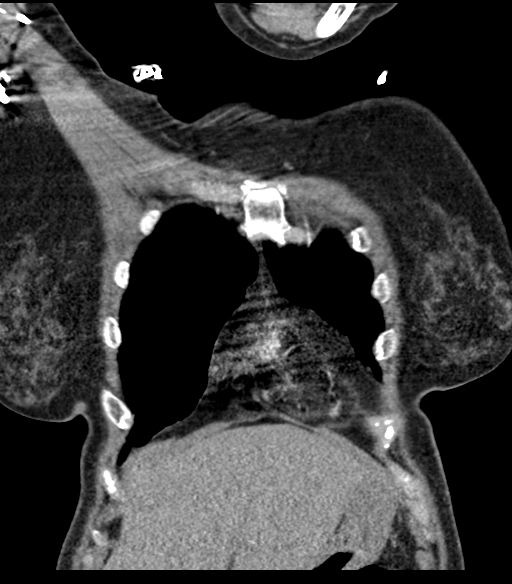
[im 49/98  soft-tissue]
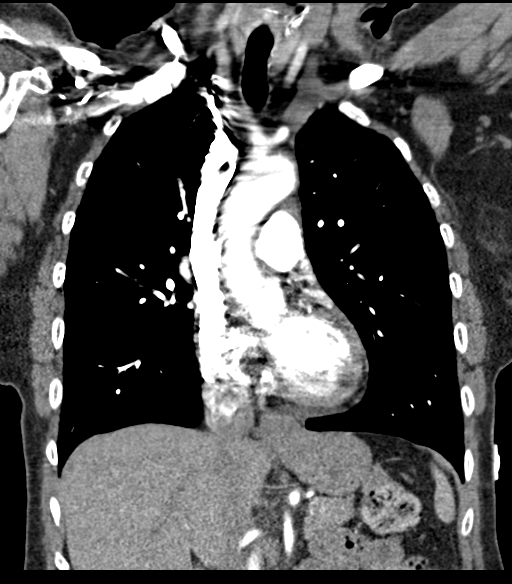
[im 73/98  soft-tissue]
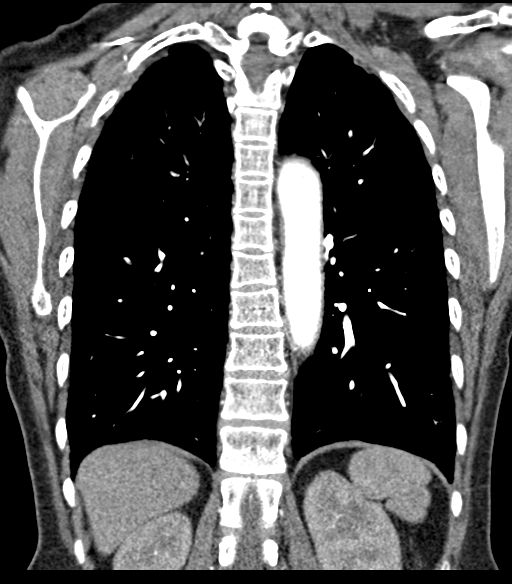

[19 of 46 positions shown; findings below may reference images not displayed]

FINDINGS: Cardiovascular: There is no cardiomegaly or pericardial effusion.
Coronary vascular calcification of the LAD. Mild atherosclerotic
calcification of the aortic arch. There is no CT evidence of
pulmonary embolism.

Mediastinum/Nodes: No hilar or mediastinal adenopathy. Small hiatal
hernia. The esophagus and the thyroid gland are grossly
unremarkable. No mediastinal fluid collection.

Lungs/Pleura: There is emphysematous changes of the lungs. No focal
consolidation, pleural effusion, or pneumothorax. The central
airways are patent.

Upper Abdomen: Left adrenal thickening/hyperplasia versus adenoma.
An 8 mm left renal interpolar nonobstructing calculus.

Musculoskeletal: No chest wall abnormality. No acute or significant
osseous findings.

Review of the MIP images confirms the above findings.
IMPRESSION: No acute intrathoracic pathology. No CT evidence of pulmonary
embolism.

## 2019-05-16 IMAGING — DX DG CHEST 1V PORT
1 series · 1 of 1 positions shown · non-contrast
Comparison: 07/06/2018

CLINICAL DATA: Chest pain, hypoxia

EXAM:
PORTABLE CHEST 1 VIEW

[chest ap]
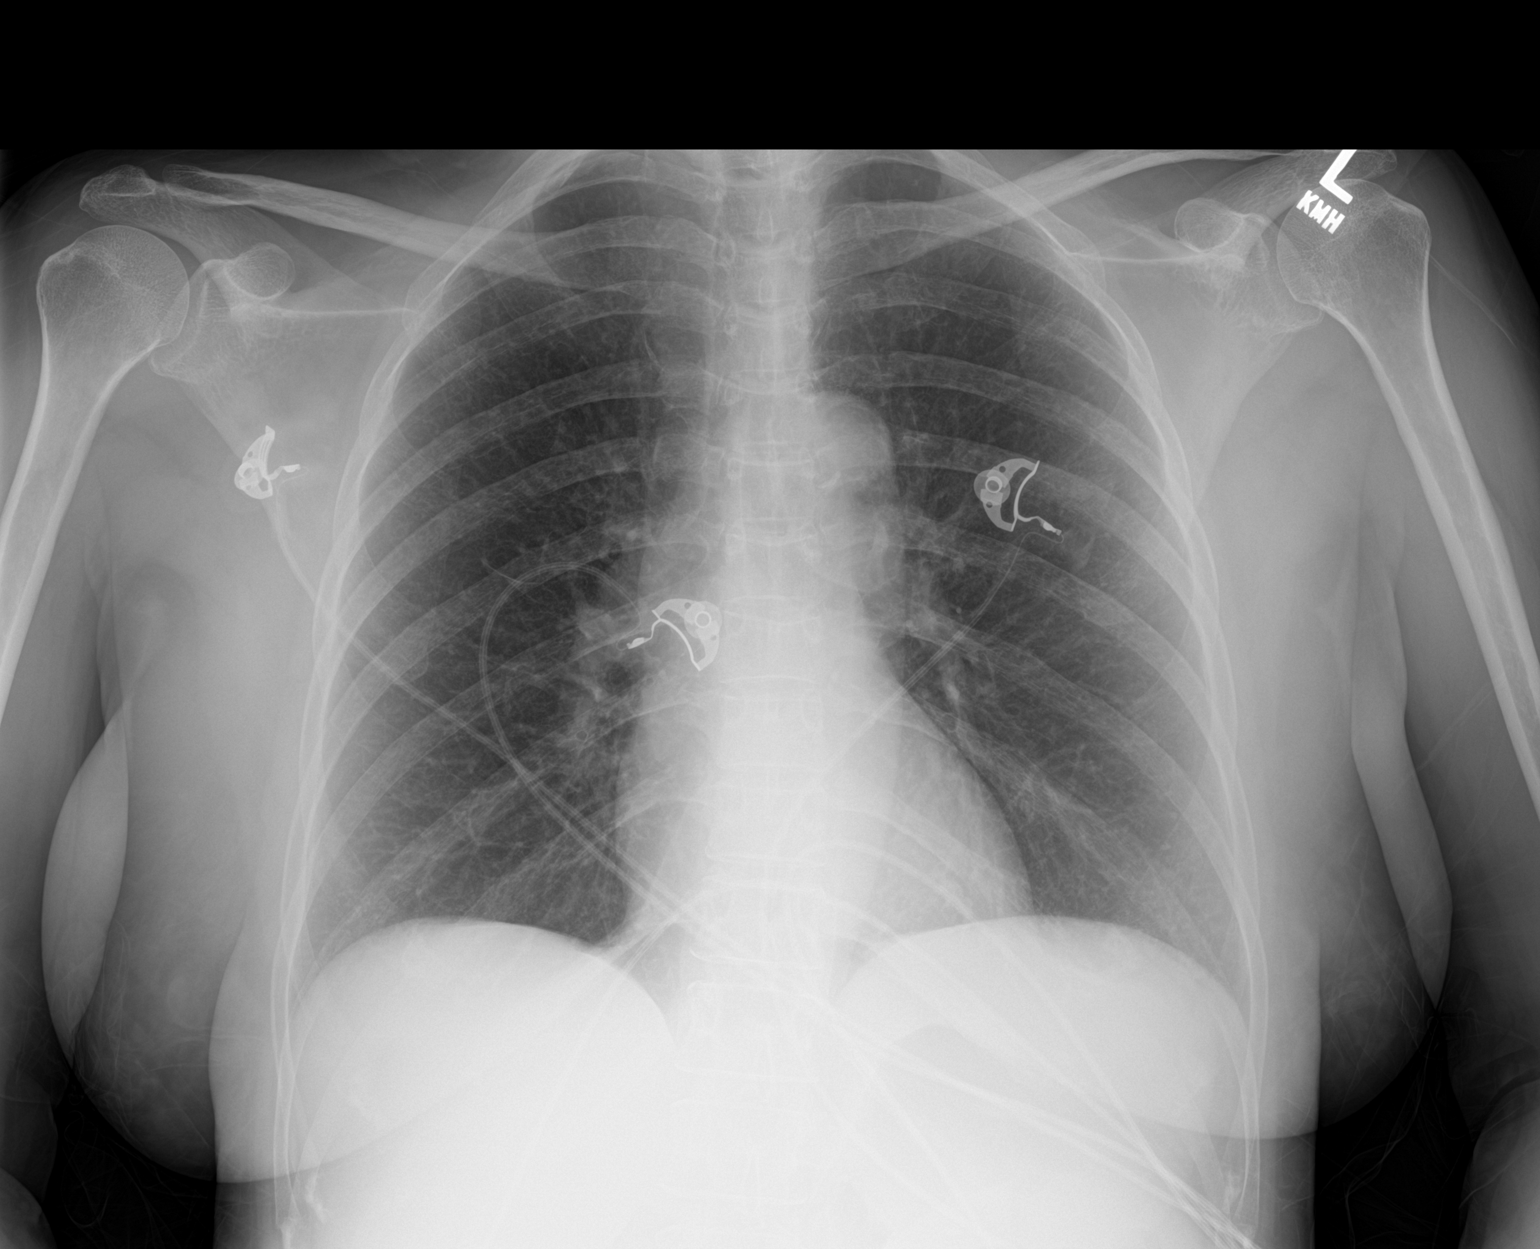

[1 of 1 positions shown; findings below may reference images not displayed]

FINDINGS: Heart and mediastinal contours are within normal limits. No focal
opacities or effusions. No acute bony abnormality.
IMPRESSION: No active disease.

## 2019-06-08 ENCOUNTER — Other Ambulatory Visit: Payer: Self-pay | Admitting: Physician Assistant

## 2019-06-08 ENCOUNTER — Encounter: Payer: Self-pay | Admitting: Family Medicine

## 2019-06-08 ENCOUNTER — Other Ambulatory Visit: Payer: Self-pay | Admitting: Family Medicine

## 2019-06-08 DIAGNOSIS — L299 Pruritus, unspecified: Secondary | ICD-10-CM

## 2019-06-08 MED FILL — TRIAMCINOLONE ACETONIDE 0.1: 0.1 | 22 days supply | Qty: 60 | Fill #0

## 2019-06-08 MED FILL — hydrOXYzine HCL 25 MG TABS: 25 | 30 days supply | Qty: 90 | Fill #1

## 2019-06-08 MED FILL — BENZONATATE 100 MG CAPS: 100 | 10 days supply | Qty: 20 | Fill #0

## 2019-06-08 MED FILL — ?MIRTAZAPINE 30 MG TABLET: 30 | 30 days supply | Qty: 30 | Fill #3

## 2019-06-08 MED FILL — ?CETIRIZINE HCL 10 MG TABLE: 10 | 30 days supply | Qty: 30 | Fill #0

## 2019-06-08 NOTE — Telephone Encounter (Signed)
Please schedule patient for flu vaccine and assist with refills if appropriate

## 2019-06-09 ENCOUNTER — Ambulatory Visit: Payer: Self-pay | Attending: Family Medicine | Admitting: Pharmacist

## 2019-06-09 ENCOUNTER — Other Ambulatory Visit: Payer: Self-pay

## 2019-06-09 DIAGNOSIS — Z23 Encounter for immunization: Secondary | ICD-10-CM

## 2019-06-09 NOTE — Progress Notes (Signed)
Patient presents for vaccination against influenza per orders of Dr. Newlin. Consent given. Counseling provided. No contraindications exists. Vaccine administered without incident.   

## 2019-06-16 MED FILL — ?AMLODIPINE BESYLATE 10 MG: 10 | 90 days supply | Qty: 90 | Fill #1

## 2019-06-16 MED FILL — ?PANTOPRAZOLE SODI DR 40MGT: 40 | 30 days supply | Qty: 30 | Fill #3

## 2019-06-16 MED FILL — ?CARVEDILOL 3.125 MG TABLET: 3.125 | 30 days supply | Qty: 60 | Fill #1

## 2019-06-23 ENCOUNTER — Ambulatory Visit (HOSPITAL_COMMUNITY): Payer: Self-pay

## 2019-06-23 ENCOUNTER — Other Ambulatory Visit (HOSPITAL_COMMUNITY): Payer: Self-pay | Admitting: *Deleted

## 2019-06-23 DIAGNOSIS — Z1231 Encounter for screening mammogram for malignant neoplasm of breast: Secondary | ICD-10-CM

## 2019-07-05 ENCOUNTER — Ambulatory Visit: Payer: Self-pay | Attending: Family Medicine | Admitting: Family Medicine

## 2019-07-05 ENCOUNTER — Encounter: Payer: Self-pay | Admitting: Family Medicine

## 2019-07-05 DIAGNOSIS — J449 Chronic obstructive pulmonary disease, unspecified: Secondary | ICD-10-CM

## 2019-07-05 DIAGNOSIS — Z720 Tobacco use: Secondary | ICD-10-CM

## 2019-07-05 DIAGNOSIS — M5416 Radiculopathy, lumbar region: Secondary | ICD-10-CM

## 2019-07-05 DIAGNOSIS — F419 Anxiety disorder, unspecified: Secondary | ICD-10-CM

## 2019-07-05 DIAGNOSIS — I10 Essential (primary) hypertension: Secondary | ICD-10-CM

## 2019-07-05 DIAGNOSIS — G4709 Other insomnia: Secondary | ICD-10-CM

## 2019-07-05 MED ORDER — DULOXETINE HCL 60 MG PO CPEP: 60.0000 mg | ORAL_CAPSULE | Freq: Every day | ORAL | 1 refills | Status: DC

## 2019-07-05 MED ORDER — GABAPENTIN 300 MG PO CAPS: 600.0000 mg | ORAL_CAPSULE | Freq: Three times a day (TID) | ORAL | 3 refills | Status: DC

## 2019-07-05 MED ORDER — CARVEDILOL 3.125 MG PO TABS: 3.1250 mg | ORAL_TABLET | Freq: Two times a day (BID) | ORAL | 1 refills | Status: DC

## 2019-07-05 MED ORDER — BREO ELLIPTA 100-25 MCG/INH IN AEPB: 1.0000 | INHALATION_SPRAY | Freq: Every day | RESPIRATORY_TRACT | 3 refills | Status: DC

## 2019-07-05 MED ORDER — TIZANIDINE HCL 4 MG PO TABS: 4.0000 mg | ORAL_TABLET | Freq: Three times a day (TID) | ORAL | 6 refills | Status: DC | PRN

## 2019-07-05 MED ORDER — MIRTAZAPINE 30 MG PO TABS: 30.0000 mg | ORAL_TABLET | Freq: Every day | ORAL | 6 refills | Status: DC

## 2019-07-05 MED ORDER — AMLODIPINE BESYLATE 10 MG PO TABS: 10.0000 mg | ORAL_TABLET | Freq: Every day | ORAL | 1 refills | Status: DC

## 2019-07-05 MED ORDER — HYDROXYZINE HCL 25 MG PO TABS: 25.0000 mg | ORAL_TABLET | Freq: Three times a day (TID) | ORAL | 2 refills | Status: DC | PRN

## 2019-07-05 MED FILL — GABAPENTIN 300 MG CAPSULE: 300 | 30 days supply | Qty: 180 | Fill #0

## 2019-07-05 MED FILL — ?DULOXETINE HCL 60 MG CPEP: 60 | 30 days supply | Qty: 30 | Fill #0

## 2019-07-05 MED FILL — hydrOXYzine HCL 25 MG TABS: 25 | 30 days supply | Qty: 90 | Fill #0

## 2019-07-05 MED FILL — tiZANidine HCL 4 MG TABS: 4 | 30 days supply | Qty: 90 | Fill #0

## 2019-07-05 MED FILL — $BREO ELLIPTA 100-25 MCG IH: 100-25 MCG | 90 days supply | Qty: 180 | Fill #0

## 2019-07-05 MED FILL — ?MIRTAZAPINE 30 MG TABLET: 30 | 30 days supply | Qty: 30 | Fill #0

## 2019-07-05 NOTE — Progress Notes (Signed)
Virtual Visit via Telephone Note  I connected with Tammy Schwartz, on 07/05/2019 at 1:31 PM by telephone due to the COVID-19 pandemic and verified that I am speaking with the correct person using two identifiers.   Consent: I discussed the limitations, risks, security and privacy concerns of performing an evaluation and management service by telephone and the availability of in person appointments. I also discussed with the patient that there may be a patient responsible charge related to this service. The patient expressed understanding and agreed to proceed.   Location of Patient: Home  Location of Provider: Clinic   Persons participating in Telemedicine visit: Kariana Wiles Farrington-CMA Dr. Nelwyn Salisbury     History of Present Illness: Tammy Schwartz  is a 63 year old female with history of hypertension, tobacco abuse, COPD, lumbar spondylosis status post back surgery who presents today for follow-up visit. She has persistent low back pain which radiates down legs and sometimes her back gives up on her.  Denies recent falls, loss of sphincteric function and rates pain as a 9/10 prior to using medications and this improves to 5/10 afterwards. Her medications- Gabapentin and muscle relaxants have been beneficial; gabapentin is sedating. Her R foot hurts and feels like "it is on fire" She would not like to have any more back surgery.  Compliant with her antihypertensive and inhalers for COPD with no recent exacerbations.  She continues to smoke and is not ready to quit at this time.  She has run out of her Remeron which she takes for insomnia and is needing refill.  Anxiety is controlled.  Past Medical History:  Diagnosis Date  . Chronic back pain    HNp,spondylosis,radiculopathy  . COPD (chronic obstructive pulmonary disease) (HCC)   . Hyperlipidemia    was on choloesterol meds last yr-samples given in office but not needed since  . Hypertension    takes  Exforge daily  . Joint pain   . Shortness of breath    takes Singulair daily;with exertion  . Sickle cell trait (HCC)   . Spondylolysis   . Tobacco use    No Known Allergies  Current Outpatient Medications on File Prior to Visit  Medication Sig Dispense Refill  . amLODipine (NORVASC) 10 MG tablet Take 1 tablet (10 mg total) by mouth daily. 90 tablet 1  . aspirin EC 81 MG tablet Take 162 mg by mouth daily.    . carvedilol (COREG) 3.125 MG tablet Take 1 tablet (3.125 mg total) by mouth 2 (two) times daily with a meal. 60 tablet 3  . cetirizine (ZYRTEC) 10 MG tablet TAKE 1 TABLET BY MOUTH DAILY. 30 tablet 2  . chlorhexidine (HIBICLENS) 4 % external liquid Apply topically daily as needed. Dilute 10-15 mL in water, Use daily when bathing for 1-2 weeks 120 mL 0  . DULoxetine (CYMBALTA) 60 MG capsule Take 1 capsule (60 mg total) by mouth daily. 30 capsule 6  . fluticasone (FLONASE) 50 MCG/ACT nasal spray Place 1 spray into both nostrils daily. 16 g 0  . fluticasone furoate-vilanterol (BREO ELLIPTA) 100-25 MCG/INH AEPB Inhale 1 puff into the lungs daily. 90 each 3  . gabapentin (NEURONTIN) 300 MG capsule Take 2 capsules (600 mg total) by mouth 3 (three) times daily. 180 capsule 3  . hydrOXYzine (ATARAX/VISTARIL) 25 MG tablet TAKE 1 TABLET (25 MG TOTAL) BY MOUTH EVERY 8 (EIGHT) HOURS AS NEEDED. 90 tablet 2  . lactulose (CHRONULAC) 10 GM/15ML solution Take 15 mLs (10 g total) by mouth  2 (two) times daily as needed for mild constipation. 946 mL 0  . meclizine (ANTIVERT) 25 MG tablet Take 1 tablet (25 mg total) by mouth 3 (three) times daily as needed for dizziness. 60 tablet 1  . mirtazapine (REMERON) 30 MG tablet Take 1 tablet (30 mg total) by mouth at bedtime. 30 tablet 6  . montelukast (SINGULAIR) 10 MG tablet Take 10 mg by mouth daily.     . Multiple Vitamin (MULTIVITAMIN WITH MINERALS) TABS Take 1 tablet by mouth daily.    . polyethylene glycol powder (GLYCOLAX/MIRALAX) powder Take 17 g by mouth  daily. 3350 g 1  . tiZANidine (ZANAFLEX) 4 MG tablet Take 1 tablet (4 mg total) by mouth every 8 (eight) hours as needed for muscle spasms. 90 tablet 6  . traMADol (ULTRAM) 50 MG tablet Take 1 tablet (50 mg total) by mouth at bedtime. 30 tablet 1  . triamcinolone cream (KENALOG) 0.1 % Apply 1 application topically 2 (two) times daily. 80 g 0  . VENTOLIN HFA 108 (90 Base) MCG/ACT inhaler INHALE 2 PUFFS INTO THE LUNGS EVERY 6 HOURS AS NEEDED 18 g 2  . benzonatate (TESSALON) 100 MG capsule TAKE 1 CAPSULE BY MOUTH 2 TIMES DAILY AS NEEDED FOR COUGH. (Patient not taking: Reported on 07/05/2019) 20 capsule 0  . cephALEXin (KEFLEX) 500 MG capsule Take 1 capsule (500 mg total) by mouth 2 (two) times daily. (Patient not taking: Reported on 07/05/2019) 14 capsule 0  . pantoprazole (PROTONIX) 40 MG tablet Take 1 tablet (40 mg total) by mouth daily for 30 days. 30 tablet 6  . phenazopyridine (PYRIDIUM) 100 MG tablet One pill three times per day for 2 days to decrease pain (Patient not taking: Reported on 07/05/2019) 6 tablet 0   No current facility-administered medications on file prior to visit.     Observations/Objective: Awake, alert, oriented x3 Not in acute distress  Assessment and Plan: 1. Essential hypertension Controlled Counseled on blood pressure goal of less than 130/80, low-sodium, DASH diet, medication compliance, 150 minutes of moderate intensity exercise per week. Discussed medication compliance, adverse effects. - amLODipine (NORVASC) 10 MG tablet; Take 1 tablet (10 mg total) by mouth daily.  Dispense: 90 tablet; Refill: 1 - carvedilol (COREG) 3.125 MG tablet; Take 1 tablet (3.125 mg total) by mouth 2 (two) times daily with a meal.  Dispense: 180 tablet; Refill: 1  2. Lumbar radiculopathy Uncontrolled Referred to PT - DULoxetine (CYMBALTA) 60 MG capsule; Take 1 capsule (60 mg total) by mouth daily.  Dispense: 90 capsule; Refill: 1 - Ambulatory referral to Physical Therapy - tiZANidine  (ZANAFLEX) 4 MG tablet; Take 1 tablet (4 mg total) by mouth every 8 (eight) hours as needed for muscle spasms.  Dispense: 90 tablet; Refill: 6 - gabapentin (NEURONTIN) 300 MG capsule; Take 2 capsules (600 mg total) by mouth 3 (three) times daily.  Dispense: 180 capsule; Refill: 3  3. Chronic obstructive pulmonary disease, unspecified COPD type (HCC) Stable - fluticasone furoate-vilanterol (BREO ELLIPTA) 100-25 MCG/INH AEPB; Inhale 1 puff into the lungs daily.  Dispense: 90 each; Refill: 3  4. Anxiety Controlled - hydrOXYzine (ATARAX/VISTARIL) 25 MG tablet; Take 1 tablet (25 mg total) by mouth every 8 (eight) hours as needed.  Dispense: 90 tablet; Refill: 2  5. Other insomnia Gabapentin has been beneficial Refilled Remeron as per request - mirtazapine (REMERON) 30 MG tablet; Take 1 tablet (30 mg total) by mouth at bedtime.  Dispense: 30 tablet; Refill: 6  6. Tobacco use Spent 3  minutes counseling on cessation and she is not ready to quit at this time   Follow Up Instructions: 3 months medical conditions-in person   I discussed the assessment and treatment plan with the patient. The patient was provided an opportunity to ask questions and all were answered. The patient agreed with the plan and demonstrated an understanding of the instructions.   The patient was advised to call back or seek an in-person evaluation if the symptoms worsen or if the condition fails to improve as anticipated.     I provided 17 minutes total of non-face-to-face time during this encounter including median intraservice time, reviewing previous notes, labs, imaging, medications, management and patient verbalized understanding.     Charlott Rakes, MD, FAAFP. [facility] and [facility] [place], [place]   07/05/2019, 1:31 PM

## 2019-07-05 NOTE — Progress Notes (Signed)
Patient has been called and DOB has been verified. Patient has been screened and transferred to PCP to start phone visit.   Patient is having pain in back and legs. 

## 2019-07-26 ENCOUNTER — Ambulatory Visit (HOSPITAL_COMMUNITY)
Admission: RE | Admit: 2019-07-26 | Discharge: 2019-07-26 | Disposition: A | Discharge status: Home / Self Care | Payer: Self-pay | Source: Ambulatory Visit | Attending: Obstetrics and Gynecology | Admitting: Obstetrics and Gynecology

## 2019-07-26 ENCOUNTER — Encounter (HOSPITAL_COMMUNITY): Payer: Self-pay

## 2019-07-26 ENCOUNTER — Other Ambulatory Visit: Payer: Self-pay

## 2019-07-26 DIAGNOSIS — Z1239 Encounter for other screening for malignant neoplasm of breast: Secondary | ICD-10-CM | POA: Insufficient documentation

## 2019-07-26 NOTE — Addendum Note (Signed)
Encounter addended by: Loletta Parish, RN on: 07/26/2019 3:09 PM  Actions taken: Follow-up modified, Clinical Note Signed

## 2019-07-26 NOTE — Patient Instructions (Addendum)
Explained breast self awareness with Tammy Schwartz. Patient did not need a Pap smear today due to last Pap smear and HPV typing was 07/31/2017. Let her know [facility] will cover Pap smears and HPV typing every 5 years unless has a history of abnormal Pap smears. Referred patient to the [facility] for a screening mammogram. Appointment scheduled for Thursday, July 28, 2019 at 0910. Patient aware of appointment and will be there. Let patient know the [facility] will follow up with her within the next couple weeks with results of her mammogram by letter or phone. Discussed smoking cessation with patient. Referred to the [facility] Quitline and gave resources to the free smoking cessation classes at [facility]. Tammy Schwartz verbalized understanding.  Cordale Manera, Arvil Chaco, RN 2:54 PM

## 2019-07-26 NOTE — Progress Notes (Addendum)
No complaints today.   Pap Smear: Pap smear not completed today. Last Pap smear was 07/31/2017 at [facility] and normal with negative HPV. Per patient has no history of an abnormal Pap smear. Last Pap smear result is in Epic.  Physical exam: Breasts Right breast slightly larger than left breast due to patient has a history of a left breast lumpectomy in 2006 or 2007 due to benign breast lump. No skin abnormalities bilateral breasts. No nipple retraction bilateral breasts. No nipple discharge bilateral breasts. No lymphadenopathy. No lumps palpated bilateral breasts. No complaints of pain or tenderness on exam. Referred patient to the [facility] for a screening mammogram. Appointment scheduled for Thursday, July 28, 2019 at 0910.        Pelvic/Bimanual No Pap smear completed today since last Pap smear and HPV typing was 07/31/2017. Pap smear not indicated per [facility]CP guidelines.   Smoking History: Patient is a current smoker. Discussed smoking cessation with patient. Referred to the [facility] Quitline and gave resources to the free smoking cessation classes at [facility].  Patient Navigation: Patient education provided. Access to services provided for patient through [place] program.   Colorectal Cancer Screening: Patient had a colonoscopy completed 12/27/2014. Patient completed a FIT Test 12/18/2017 that was negative. No complaints today.  Breast and Cervical Cancer Risk Assessment: Patient has no family history of breast cancer, known genetic mutations, or radiation treatment to the chest before age 75. Patient has no history of cervical dysplasia, immunocompromised, or DES exposure in-utero.  Risk Assessment    Risk Scores      07/26/2019   Last edited by: Loletta Parish, RN   5-year risk: 1.6 %   Lifetime risk: 6.1 %

## 2019-07-28 ENCOUNTER — Ambulatory Visit
Admission: RE | Admit: 2019-07-28 | Discharge: 2019-07-28 | Disposition: A | Discharge status: Home / Self Care | Payer: No Typology Code available for payment source | Source: Ambulatory Visit | Attending: Obstetrics and Gynecology | Admitting: Obstetrics and Gynecology

## 2019-07-28 ENCOUNTER — Other Ambulatory Visit: Payer: Self-pay

## 2019-07-28 DIAGNOSIS — Z1231 Encounter for screening mammogram for malignant neoplasm of breast: Secondary | ICD-10-CM

## 2019-08-01 ENCOUNTER — Other Ambulatory Visit: Payer: Self-pay

## 2019-08-01 ENCOUNTER — Encounter: Payer: Self-pay | Admitting: Physical Therapy

## 2019-08-01 ENCOUNTER — Ambulatory Visit: Payer: Self-pay | Attending: Family Medicine | Admitting: Physical Therapy

## 2019-08-01 DIAGNOSIS — M6281 Muscle weakness (generalized): Secondary | ICD-10-CM | POA: Insufficient documentation

## 2019-08-01 DIAGNOSIS — R262 Difficulty in walking, not elsewhere classified: Secondary | ICD-10-CM | POA: Insufficient documentation

## 2019-08-01 DIAGNOSIS — M5416 Radiculopathy, lumbar region: Secondary | ICD-10-CM | POA: Insufficient documentation

## 2019-08-01 NOTE — Therapy (Signed)
[facility] [address] [place], [place], [address] Phone: [phone]   Fax:  [phone]  Physical Therapy Treatment  Patient Details  Name: Tammy Schwartz MRN: [id number] Date of Birth: 03-31-1956 Referring Provider (PT): Hoy Register, MD   Encounter Date: 08/01/2019  PT End of Session - 08/01/19 1515    Visit Number  1    Number of Visits  12    Date for PT Re-Evaluation  09/12/19    Authorization Type  CAFA 100% 05/10/19-11/10/2019    PT Start Time  1429   arrived 14 min late for eval   PT Stop Time  1502    PT Time Calculation (min)  33 min    Activity Tolerance  Patient limited by pain    Behavior During Therapy  [facility] for tasks assessed/performed       Past Medical History:  Diagnosis Date  . Chronic back pain    HNp,spondylosis,radiculopathy  . COPD (chronic obstructive pulmonary disease) (HCC)   . Hyperlipidemia    was on choloesterol meds last yr-samples given in office but not needed since  . Hypertension    takes Exforge daily  . Joint pain   . Shortness of breath    takes Singulair daily;with exertion  . Sickle cell trait (HCC)   . Spondylolysis   . Tobacco use     Past Surgical History:  Procedure Laterality Date  . BACK SURGERY    . BREAST EXCISIONAL BIOPSY Left 2007   benign  . BREAST SURGERY Left    lumpectomy  . COLONOSCOPY    . DILATION AND CURETTAGE OF UTERUS    . LUMBAR LAMINECTOMY/DECOMPRESSION MICRODISCECTOMY  06/04/2012   Procedure: LUMBAR LAMINECTOMY/DECOMPRESSION MICRODISCECTOMY 1 LEVEL;  Surgeon: Carmela Hurt, MD;  Location: [facility];  Service: Neurosurgery;  Laterality: Left;  LEFT Lumbar five-sacral one diskectomy  . LUMBAR LAMINECTOMY/DECOMPRESSION MICRODISCECTOMY Right 06/16/2013   Procedure: RIGHT Lumbar Five-Sacral One Microdiskectomy;  Surgeon: Carmela Hurt, MD;  Location: [facility];  Service: Neurosurgery;  Laterality: Right;  RIGHT Lumbar Five-Sacral One  Microdiskectomy  . LUMBAR LAMINECTOMY/DECOMPRESSION MICRODISCECTOMY Right 01/20/2014   Procedure: LUMBAR FOUR TO LUMBAR FIVE LUMBAR LAMINECTOMY/DECOMPRESSION MICRODISCECTOMY 1 LEVEL;  Surgeon: Carmela Hurt, MD;  Location: [facility];  Service: Neurosurgery;  Laterality: Right;  Right L45 laminectomy and foramintomy    There were no vitals filed for this visit.  Subjective Assessment - 08/01/19 1434    Subjective  Pt. is a 63 y/o female referred to PT with chronic LBP and right>left LE radicular symptoms. She has a history of multiple lumbar surgeries including laminectomy with decompression 06/04/12, L5-S1 microdiscectomy 06/16/13, L4-5 laminectomy and decompression 01/20/14. She reports chronic pain has persisted but has been worse the past 2 months. No new mechanism of onset or injury noted. She reports that her legs sometimes feel like they will "give out" with walking and she has been using SPC as a result. Pain is in midline lumbar region but radiaties into both legs and feet moreso on the right. No recent/new imaging. Pt. denies bowel or bladder changes.    Pertinent History  chronic LBP with multiple surgeries, sicke cell trait, COPD, tobacco use    Limitations  Sitting;House hold activities;Lifting;Standing;Walking    Diagnostic tests  last MRI in 2014, X-rays 2015    Patient Stated Goals  relieve pain, avoid another surgery    Currently in Pain?  Yes    Pain Score  9  Pain Location  Back    Pain Orientation  Right;Left    Pain Descriptors / Indicators  Burning;Throbbing    Pain Type  Chronic pain    Pain Radiating Towards  bilateral legs worse on right-goes into feet    Pain Onset  More than a month ago    Pain Frequency  Constant    Aggravating Factors   walking, bending to pick up items from floor    Pain Relieving Factors  medication    Effect of Pain on Daily Activities  limits walking tolerance, difficulty with chores, cleaning         [facility] PT Assessment - 08/01/19 0001       Assessment   Medical Diagnosis  Lumbar radiculopathy    Referring Provider (PT)  Charlott Rakes, MD    Onset Date/Surgical Date  --   exacerbated 2 months ago, muliple years of pain prior   Hand Dominance  Right    Prior Therapy  no recent therapy   reports did PT for back at another facility 20 years ago     Precautions   Precautions  None      Restrictions   Weight Bearing Restrictions  No      Balance Screen   Has the patient fallen in the past 6 months  Yes    How many times?  [place] residence    Living Arrangements  Spouse/significant other;Children    Type of [place] Access  Level entry    [place] - single point      Prior Function   Level of Independence  --   uses SPC for prolonged ambulation otherwise independent     Cognition   Overall Cognitive Status  Within Functional Limits for tasks assessed      Observation/Other Assessments   Focus on Therapeutic Outcomes (FOTO)   61% limited      Sensation   Light Touch  Appears Intact   bilat. L2-S2 dermatomes     ROM / Strength   AROM / PROM / Strength  AROM;PROM;Strength      AROM   AROM Assessment Site  Hip;Lumbar    Right/Left Hip  Right;Left    Right Hip Flexion  90    Right Hip External Rotation   40    Right Hip Internal Rotation   10    Right Hip ABduction  30    Left Hip Flexion  90    Left Hip External Rotation   40    Left Hip Internal Rotation   20    Left Hip ABduction  30    Lumbar Flexion  45    Lumbar Extension  10   increased LBP   Lumbar - Right Side Bend  18    Lumbar - Left Side Bend  20    Lumbar - Right Rotation  50%    Lumbar - Left Rotation  50%      PROM   Overall PROM Comments  Guarding with hip PROM some cogwheeling noted, hip PROM limited grossly consistent with AROM as noted      Strength   Strength Assessment Site  Hip;Knee;Ankle    Right/Left Hip  Right;Left     Right Hip Flexion  4-/5    Right Hip External Rotation   4-/5  Right Hip Internal Rotation  4-/5    Left Hip Flexion  4/5    Left Hip External Rotation  4/5    Left Hip Internal Rotation  4/5    Right/Left Knee  Right;Left    Right Knee Flexion  4/5    Right Knee Extension  4/5    Left Knee Flexion  4+/5    Left Knee Extension  4+/5    Right/Left Ankle  Right;Left    Right Ankle Dorsiflexion  4+/5    Right Ankle Inversion  4+/5    Right Ankle Eversion  4/5    Left Ankle Dorsiflexion  5/5    Left Ankle Inversion  5/5    Left Ankle Eversion  5/5      Ambulation/Gait   Gait Comments  Pt. ambulates in clinic independently without AD, she reports SPC use for more prolonged/community ambulation due to issues with her legs giving out                           PT Education - 08/01/19 1515    Education Details  POC, pain science education    Person(s) Educated  Patient    Methods  Explanation    Comprehension  Verbalized understanding          PT Long Term Goals - 08/01/19 1522      PT LONG TERM GOAL #1   Title  Independent with HEP    Baseline  needs HEP    Time  6    Period  Weeks    Status  New    Target Date  09/12/19      PT LONG TERM GOAL #2   Title  Improve FOTO outcome measure score to 41% or less limitation due to LBP    Baseline  61% limited    Time  6    Period  Weeks    Status  New    Target Date  09/12/19      PT LONG TERM GOAL #3   Title  Increase trunk flexion AROM at least 20 deg to improve ability for bending for chores    Baseline  45 deg    Time  6    Period  Weeks    Status  New    Target Date  09/12/19      PT LONG TERM GOAL #4   Title  Increase right knee strength at least 1/2 MMT grade to improve stability for gait to decrease fall risk    Baseline  4/5    Time  6    Period  Weeks    Status  New    Target Date  09/12/19      PT LONG TERM GOAL #5   Title  Tolerate standing and ambulation for grocery shopping for  periods at least 20-30 minutes with pain 5-6/10 at worst    Baseline  8-9/10    Time  6    Period  Weeks    Status  New    Target Date  09/12/19            Plan - 08/01/19 1516    Clinical Impression Statement  Pt. presents with chronic LBP exacerbated the past 2 months with right>left LE radicular symptoms. Mild flexion directional preference noted at eval and given this and past MRI findings with most pain exacerbation noted with walking would suspect underlying degenerative changes/stenosis. Pt. would benefit from  PT to help relieve pain and address current associated functional limitations for mobility.    Personal Factors and Comorbidities  Comorbidity 3+    Comorbidities  COPD, chronic LBP history, multiple previous lumbar surgeries, tobacco use    Examination-Activity Limitations  Bathing;Lift;Stand;Locomotion Level;Bend;Carry;Sit;Sleep;Squat;Stairs    Examination-Participation Restrictions  Meal Prep;Cleaning;Community Activity;Shop;Laundry    Stability/Clinical Decision Making  Evolving/Moderate complexity    Clinical Decision Making  Moderate    Rehab Potential  Fair    PT Frequency  2x / week    PT Duration  6 weeks    PT Treatment/Interventions  ADLs/Self Care Home Management;Electrical Stimulation;Moist Heat;Traction;Ultrasound;Cryotherapy;Therapeutic activities;Functional mobility training;Gait training;Therapeutic exercise;Dry needling;Manual techniques;Spinal Manipulations;Patient/family education;Neuromuscular re-education;Taping    PT Next Visit Plan  Try NUSTEP warm up, try pelvic tilts, flexion bias ROM with SKTC, gentle core activation and stretches, modalities prn    PT Home Exercise Plan  no time for HEP at eval given late arrival    Consulted and Agree with Plan of Care  Patient       Patient will benefit from skilled therapeutic intervention in order to improve the following deficits and impairments:  Pain, Impaired flexibility, Difficulty walking, Decreased  strength, Decreased range of motion, Decreased activity tolerance  Visit Diagnosis: Radiculopathy, lumbar region  Muscle weakness (generalized)  Difficulty in walking, not elsewhere classified     Problem List Patient Active Problem List   Diagnosis Date Noted  . Screening breast examination 07/26/2019  . Cellulitis 05/17/2018  . Lumbar radiculopathy 02/27/2017  . Poor dentition 02/11/2017  . Acute bronchitis   . Chest pain 05/07/2016  . Chest pain at rest 05/07/2016  . Essential hypertension   . Hyperlipidemia   . Tobacco use   . Spondylolysis   . Chronic obstructive pulmonary disease (HCC)   . Lumbar spondylosis 01/20/2014  . HNP (herniated nucleus pulposus), lumbar 06/04/2012    Lazarus Gowdahristopher Suetta Hoffmeister, PT, DPT 08/01/19 3:27 PM  [facility] [address] [place], [place], [address] Phone: [phone]   Fax:  [phone]  Name: Tammy Schwartz MRN: [id number] Date of Birth: 03/03/1956

## 2019-08-16 ENCOUNTER — Encounter: Payer: Self-pay | Admitting: Physical Therapy

## 2019-08-16 ENCOUNTER — Other Ambulatory Visit: Payer: Self-pay

## 2019-08-16 ENCOUNTER — Ambulatory Visit: Payer: No Typology Code available for payment source | Attending: Family Medicine | Admitting: Physical Therapy

## 2019-08-16 DIAGNOSIS — M6281 Muscle weakness (generalized): Secondary | ICD-10-CM

## 2019-08-16 DIAGNOSIS — R262 Difficulty in walking, not elsewhere classified: Secondary | ICD-10-CM | POA: Insufficient documentation

## 2019-08-16 DIAGNOSIS — M5416 Radiculopathy, lumbar region: Secondary | ICD-10-CM | POA: Insufficient documentation

## 2019-08-16 NOTE — Therapy (Addendum)
[facility] [address] [place], [place], [address] Phone: [phone]   Fax:  [phone]  Physical Therapy Treatment  Patient Details  Name: Tammy Schwartz MRN: [id number] Date of Birth: 1956-02-27 Referring Provider (PT): Hoy Register, MD   Encounter Date: 08/16/2019  PT End of Session - 08/16/19 1239    Visit Number  2    Number of Visits  12    Date for PT Re-Evaluation  09/12/19    Authorization Type  CAFA 100% 05/10/19-11/10/2019    PT Start Time  1233    PT Stop Time  1314    PT Time Calculation (min)  41 min       Past Medical History:  Diagnosis Date  . Chronic back pain    HNp,spondylosis,radiculopathy  . COPD (chronic obstructive pulmonary disease) (HCC)   . Hyperlipidemia    was on choloesterol meds last yr-samples given in office but not needed since  . Hypertension    takes Exforge daily  . Joint pain   . Shortness of breath    takes Singulair daily;with exertion  . Sickle cell trait (HCC)   . Spondylolysis   . Tobacco use     Past Surgical History:  Procedure Laterality Date  . BACK SURGERY    . BREAST EXCISIONAL BIOPSY Left 2007   benign  . BREAST SURGERY Left    lumpectomy  . COLONOSCOPY    . DILATION AND CURETTAGE OF UTERUS    . LUMBAR LAMINECTOMY/DECOMPRESSION MICRODISCECTOMY  06/04/2012   Procedure: LUMBAR LAMINECTOMY/DECOMPRESSION MICRODISCECTOMY 1 LEVEL;  Surgeon: Carmela Hurt, MD;  Location: [facility];  Service: Neurosurgery;  Laterality: Left;  LEFT Lumbar five-sacral one diskectomy  . LUMBAR LAMINECTOMY/DECOMPRESSION MICRODISCECTOMY Right 06/16/2013   Procedure: RIGHT Lumbar Five-Sacral One Microdiskectomy;  Surgeon: Carmela Hurt, MD;  Location: [facility];  Service: Neurosurgery;  Laterality: Right;  RIGHT Lumbar Five-Sacral One Microdiskectomy  . LUMBAR LAMINECTOMY/DECOMPRESSION MICRODISCECTOMY Right 01/20/2014   Procedure: LUMBAR FOUR TO LUMBAR FIVE LUMBAR  LAMINECTOMY/DECOMPRESSION MICRODISCECTOMY 1 LEVEL;  Surgeon: Carmela Hurt, MD;  Location: [facility];  Service: Neurosurgery;  Laterality: Right;  Right L45 laminectomy and foramintomy    There were no vitals filed for this visit.  Subjective Assessment - 08/16/19 1236    Subjective  I am so tired of my back and legs hurting.    Currently in Pain?  Yes    Pain Score  8     Pain Location  Back    Pain Orientation  Lower    Pain Descriptors / Indicators  Aching;Burning    Pain Type  Chronic pain    Pain Radiating Towards  bilateral anterior thighs into feet.    Aggravating Factors   walking, bending, bending    Pain Relieving Factors  med                       [facility] Adult PT Treatment/Exercise - 08/16/19 0001      Self-Care   Self-Care  Other Self-Care Comments    Other Self-Care Comments   use of roller to bilateral thighs       Lumbar Exercises: Stretches   Single Knee to Chest Stretch  3 reps;30 seconds    Lower Trunk Rotation  10 seconds    Hip Flexor Stretch  Right;1 rep;30 seconds    Hip Flexor Stretch Limitations  difficulty relaxing       Lumbar Exercises: Aerobic  Nustep  6 minutes L3 UE/LE      Lumbar Exercises: Supine   Ab Set  5 reps    AB Set Limitations  cues for breathing     Pelvic Tilt  15 reps    Pelvic Tilt Limitations  mod cues     Clam  20 reps    Clam Limitations  cues for breathing and abdominal draw in     Bent Knee Raise  20 reps    Bent Knee Raise Limitations  max cues for technique, breathing and synchronizing              PT Education - 08/16/19 1318    Education Details  HEP    Person(s) Educated  Patient    Methods  Explanation;Handout    Comprehension  Verbalized understanding          PT Long Term Goals - 08/01/19 1522      PT LONG TERM GOAL #1   Title  Independent with HEP    Baseline  needs HEP    Time  6    Period  Weeks    Status  New    Target Date  09/12/19      PT LONG TERM GOAL #2    Title  Improve FOTO outcome measure score to 41% or less limitation due to LBP    Baseline  61% limited    Time  6    Period  Weeks    Status  New    Target Date  09/12/19      PT LONG TERM GOAL #3   Title  Increase trunk flexion AROM at least 20 deg to improve ability for bending for chores    Baseline  45 deg    Time  6    Period  Weeks    Status  New    Target Date  09/12/19      PT LONG TERM GOAL #4   Title  Increase right knee strength at least 1/2 MMT grade to improve stability for gait to decrease fall risk    Baseline  4/5    Time  6    Period  Weeks    Status  New    Target Date  09/12/19      PT LONG TERM GOAL #5   Title  Tolerate standing and ambulation for grocery shopping for periods at least 20-30 minutes with pain 5-6/10 at worst    Baseline  8-9/10    Time  6    Period  Weeks    Status  New    Target Date  09/12/19            Plan - 08/16/19 1405    Clinical Impression Statement  Pt arrives with 8/10 pain in back and bilateral legs. Began with Nustep and initiated HEP for flexion baised core and stretching. She required cues for abdominal recruitment. Instructed her in self massage and issued rollers to perform on anterior thighs distal to proximal rolling and gliding. Hep handout given. Appointment made for next week as she di not have anything scheduled.    PT Next Visit Plan  continueNUSTEP warm up, review pelvic tilts, flexion bias ROM with SKTC, gentle core activation and stretches, modalities prn    PT Home Exercise Plan  no time for HEP at eval given late arrival; added knee to chest , LTR, and pelvic tilit.       Patient will benefit from skilled therapeutic  intervention in order to improve the following deficits and impairments:  Pain, Impaired flexibility, Difficulty walking, Decreased strength, Decreased range of motion, Decreased activity tolerance  Visit Diagnosis: Radiculopathy, lumbar region  Muscle weakness (generalized)  Difficulty  in walking, not elsewhere classified     Problem List Patient Active Problem List   Diagnosis Date Noted  . Screening breast examination 07/26/2019  . Cellulitis 05/17/2018  . Lumbar radiculopathy 02/27/2017  . Poor dentition 02/11/2017  . Acute bronchitis   . Chest pain 05/07/2016  . Chest pain at rest 05/07/2016  . Essential hypertension   . Hyperlipidemia   . Tobacco use   . Spondylolysis   . Chronic obstructive pulmonary disease (HCC)   . Lumbar spondylosis 01/20/2014  . HNP (herniated nucleus pulposus), lumbar 06/04/2012    Sherrie Mustacheonoho, Emileigh Kellett McGee, PTA 08/16/2019, 2:12 PM  [facility] [address] [place], [place], [address] Phone: [phone]   Fax:  [phone]  Name: Tammy Schwartz MRN: [id number] Date of Birth: 07/30/1956

## 2019-08-19 ENCOUNTER — Ambulatory Visit: Payer: Self-pay | Admitting: Physical Therapy

## 2019-08-23 ENCOUNTER — Ambulatory Visit: Payer: Self-pay | Admitting: Physical Therapy

## 2019-08-26 ENCOUNTER — Other Ambulatory Visit: Payer: Self-pay | Admitting: Family Medicine

## 2019-08-26 MED FILL — PANTOPRAZOLE SOD DR 40 MG T: 40 | 30 days supply | Qty: 30 | Fill #4

## 2019-08-26 MED FILL — ?MIRTAZAPINE 30 MG TABLET: 30 | 30 days supply | Qty: 30 | Fill #1

## 2019-08-26 MED FILL — TRIAMCINOLONE ACETONIDE 0.1: 0.1 | 5 days supply | Qty: 15 | Fill #1

## 2019-08-26 MED FILL — ?DULOXETINE HCL 60 MG CPEP: 60 | 30 days supply | Qty: 30 | Fill #1

## 2019-08-29 MED FILL — BENZONATATE 100 MG CAPS: 100 | 10 days supply | Qty: 20 | Fill #0

## 2019-08-31 ENCOUNTER — Encounter: Payer: Self-pay | Admitting: Physical Therapy

## 2019-08-31 ENCOUNTER — Ambulatory Visit: Payer: No Typology Code available for payment source | Admitting: Physical Therapy

## 2019-09-14 ENCOUNTER — Encounter: Payer: Self-pay | Admitting: Family Medicine

## 2019-09-14 ENCOUNTER — Ambulatory Visit
Admission: EM | Admit: 2019-09-14 | Discharge: 2019-09-14 | Disposition: A | Discharge status: Home / Self Care | Payer: No Typology Code available for payment source | Attending: Physician Assistant | Admitting: Physician Assistant

## 2019-09-14 DIAGNOSIS — I10 Essential (primary) hypertension: Secondary | ICD-10-CM

## 2019-09-14 DIAGNOSIS — N3001 Acute cystitis with hematuria: Secondary | ICD-10-CM | POA: Insufficient documentation

## 2019-09-14 DIAGNOSIS — B9689 Other specified bacterial agents as the cause of diseases classified elsewhere: Secondary | ICD-10-CM

## 2019-09-14 DIAGNOSIS — N3090 Cystitis, unspecified without hematuria: Secondary | ICD-10-CM | POA: Insufficient documentation

## 2019-09-14 LAB — POCT URINALYSIS DIP (MANUAL ENTRY)
Bilirubin, UA: NEGATIVE
Glucose, UA: NEGATIVE mg/dL
Ketones, POC UA: NEGATIVE mg/dL
Nitrite, UA: NEGATIVE
Protein Ur, POC: 30 mg/dL — AB
Spec Grav, UA: 1.02 (ref 1.010–1.025)
Urobilinogen, UA: 0.2 E.U./dL
pH, UA: 7 (ref 5.0–8.0)

## 2019-09-14 MED ORDER — CEPHALEXIN 500 MG PO CAPS: 500.0000 mg | ORAL_CAPSULE | Freq: Two times a day (BID) | ORAL | 0 refills | Status: DC

## 2019-09-14 MED ORDER — PHENAZOPYRIDINE HCL 200 MG PO TABS: 200.0000 mg | ORAL_TABLET | Freq: Three times a day (TID) | ORAL | 0 refills | Status: DC

## 2019-09-14 MED FILL — ?CEPHALEXIN 500MG CAPSULE: 500 | 7 days supply | Qty: 14 | Fill #0

## 2019-09-14 NOTE — ED Triage Notes (Signed)
Pt c/o urinary frequency, urgency, pain and unable to empty bladder since last night

## 2019-09-14 NOTE — ED Provider Notes (Signed)
[name]    CSN: [id number] Arrival date & time: 09/14/19  1546      History   Chief Complaint Chief Complaint  Patient presents with   Urinary Tract Infection    HPI Tammy Schwartz is a 63 y.o. female.   63 year old female comes in for 2 day history of urinary symptoms.  She has had urinary frequency, urgency, dysuria.  States has had some hesitancy, and feels that she is not emptying her bladder completely.  Denies fever, chills, body aches.  Denies abdominal pain, nausea, vomiting.  Denies vaginal discharge, itching.  Postmenopausal.  No changes in hygiene product.     Past Medical History:  Diagnosis Date   Chronic back pain    HNp,spondylosis,radiculopathy   COPD (chronic obstructive pulmonary disease) (HCC)    Hyperlipidemia    was on choloesterol meds last yr-samples given in office but not needed since   Hypertension    takes Exforge daily   Joint pain    Shortness of breath    takes Singulair daily;with exertion   Sickle cell trait (HCC)    Spondylolysis    Tobacco use     Patient Active Problem List   Diagnosis Date Noted   Screening breast examination 07/26/2019   Cellulitis 05/17/2018   Lumbar radiculopathy 02/27/2017   Poor dentition 02/11/2017   Acute bronchitis    Chest pain 05/07/2016   Chest pain at rest 05/07/2016   Essential hypertension    Hyperlipidemia    Tobacco use    Spondylolysis    Chronic obstructive pulmonary disease (HCC)    Lumbar spondylosis 01/20/2014   HNP (herniated nucleus pulposus), lumbar 06/04/2012    Past Surgical History:  Procedure Laterality Date   BACK SURGERY     BREAST EXCISIONAL BIOPSY Left 2007   benign   BREAST SURGERY Left    lumpectomy   COLONOSCOPY     DILATION AND CURETTAGE OF UTERUS     LUMBAR LAMINECTOMY/DECOMPRESSION MICRODISCECTOMY  06/04/2012   Procedure: LUMBAR LAMINECTOMY/DECOMPRESSION MICRODISCECTOMY 1 LEVEL;  Surgeon: Carmela Hurt, MD;  Location: [facility];  Service: Neurosurgery;  Laterality: Left;  LEFT Lumbar five-sacral one diskectomy   LUMBAR LAMINECTOMY/DECOMPRESSION MICRODISCECTOMY Right 06/16/2013   Procedure: RIGHT Lumbar Five-Sacral One Microdiskectomy;  Surgeon: Carmela Hurt, MD;  Location: [facility];  Service: Neurosurgery;  Laterality: Right;  RIGHT Lumbar Five-Sacral One Microdiskectomy   LUMBAR LAMINECTOMY/DECOMPRESSION MICRODISCECTOMY Right 01/20/2014   Procedure: LUMBAR FOUR TO LUMBAR FIVE LUMBAR LAMINECTOMY/DECOMPRESSION MICRODISCECTOMY 1 LEVEL;  Surgeon: Carmela Hurt, MD;  Location: [facility];  Service: Neurosurgery;  Laterality: Right;  Right L45 laminectomy and foramintomy    OB History    Gravida  2   Para      Term      Preterm      AB      Living  2     SAB      TAB      Ectopic      Multiple      Live Births  2            Home Medications    Prior to Admission medications   Medication Sig Start Date End Date Taking? Authorizing Provider  amLODipine (NORVASC) 10 MG tablet Take 1 tablet (10 mg total) by mouth daily. 07/05/19   Hoy Register, MD  aspirin EC 81 MG tablet Take 162 mg by mouth daily.    [provider]  benzonatate (TESSALON) 100 MG capsule TAKE 1 CAPSULE BY MOUTH 2 TIMES DAILY AS NEEDED FOR COUGH. 08/29/19   Hoy Register, MD  carvedilol (COREG) 3.125 MG tablet Take 1 tablet (3.125 mg total) by mouth 2 (two) times daily with a meal. 07/05/19   Hoy Register, MD  cephALEXin (KEFLEX) 500 MG capsule Take 1 capsule (500 mg total) by mouth 2 (two) times daily. 09/14/19   Cathie Hoops, Tom Ragsdale V, PA-C  cetirizine (ZYRTEC) 10 MG tablet TAKE 1 TABLET BY MOUTH DAILY. 06/08/19   Hoy Register, MD  chlorhexidine (HIBICLENS) 4 % external liquid Apply topically daily as needed. Dilute 10-15 mL in water, Use daily when bathing for 1-2 weeks 12/29/18   Domenick Gong, MD  DULoxetine (CYMBALTA) 60 MG capsule Take 1 capsule (60 mg total) by mouth daily.  07/05/19   Hoy Register, MD  fluticasone (FLONASE) 50 MCG/ACT nasal spray Place 1 spray into both nostrils daily. 03/31/18   Hoy Register, MD  fluticasone furoate-vilanterol (BREO ELLIPTA) 100-25 MCG/INH AEPB Inhale 1 puff into the lungs daily. 07/05/19   Hoy Register, MD  gabapentin (NEURONTIN) 300 MG capsule Take 2 capsules (600 mg total) by mouth 3 (three) times daily. 07/05/19   Hoy Register, MD  hydrOXYzine (ATARAX/VISTARIL) 25 MG tablet Take 1 tablet (25 mg total) by mouth every 8 (eight) hours as needed. 07/05/19   Hoy Register, MD  lactulose (CHRONULAC) 10 GM/15ML solution Take 15 mLs (10 g total) by mouth 2 (two) times daily as needed for mild constipation. 06/23/17   Hoy Register, MD  meclizine (ANTIVERT) 25 MG tablet Take 1 tablet (25 mg total) by mouth 3 (three) times daily as needed for dizziness. 07/14/18   Hoy Register, MD  mirtazapine (REMERON) 30 MG tablet Take 1 tablet (30 mg total) by mouth at bedtime. 07/05/19   Hoy Register, MD  montelukast (SINGULAIR) 10 MG tablet Take 10 mg by mouth daily.     [provider]  Multiple Vitamin (MULTIVITAMIN WITH MINERALS) TABS Take 1 tablet by mouth daily.    [provider]  pantoprazole (PROTONIX) 40 MG tablet Take 1 tablet (40 mg total) by mouth daily for 30 days. 11/08/18 04/01/19  Hoy Register, MD  phenazopyridine (PYRIDIUM) 200 MG tablet Take 1 tablet (200 mg total) by mouth 3 (three) times daily. 09/14/19   Cathie Hoops, Melisia Leming V, PA-C  polyethylene glycol powder (GLYCOLAX/MIRALAX) powder Take 17 g by mouth daily. 06/09/17   Hoy Register, MD  tiZANidine (ZANAFLEX) 4 MG tablet Take 1 tablet (4 mg total) by mouth every 8 (eight) hours as needed for muscle spasms. 07/05/19   Hoy Register, MD  traMADol (ULTRAM) 50 MG tablet Take 1 tablet (50 mg total) by mouth at bedtime. 10/01/18   Hoy Register, MD  triamcinolone cream (KENALOG) 0.1 % Apply 1 application topically 2 (two) times daily. 04/21/19   Hoy Register, MD    VENTOLIN HFA 108 (90 Base) MCG/ACT inhaler INHALE 2 PUFFS INTO THE LUNGS EVERY 6 HOURS AS NEEDED 03/02/19   Hoy Register, MD    Family History Family History  Problem Relation Age of Onset   Cancer - Other Mother    CAD Father        Died age 51   Hypertension Sister     Social History Social History   Tobacco Use   Smoking status: Current Every Day Smoker    Packs/day: 0.25    Years: 20.00    Pack years: 5.00    Types: Cigarettes   Smokeless  tobacco: Never Used  Substance Use Topics   Alcohol use: No   Drug use: No     Allergies   Patient has no known allergies.   Review of Systems Review of Systems  Reason unable to perform ROS: See HPI as above.     Physical Exam Triage Vital Signs ED Triage Vitals [09/14/19 1610]  Enc Vitals Group     BP (!) 164/81     Pulse Rate 92     Resp 18     Temp 98.1 F (36.7 C)     Temp Source Oral     SpO2 100 %     Weight      Height      Head Circumference      Peak Flow      Pain Score 5     Pain Loc      Pain Edu?      Excl. in [place]?    No data found.  Updated Vital Signs BP (!) 164/81 (BP Location: Left Arm)    Pulse 92    Temp 98.1 F (36.7 C) (Oral)    Resp 18    LMP 03/17/2012    SpO2 100%   Physical Exam Constitutional:      General: She is not in acute distress.    Appearance: She is well-developed. She is not ill-appearing, toxic-appearing or diaphoretic.  HENT:     Head: Normocephalic and atraumatic.  Eyes:     Conjunctiva/sclera: Conjunctivae normal.     Pupils: Pupils are equal, round, and reactive to light.  Cardiovascular:     Rate and Rhythm: Normal rate and regular rhythm.     Heart sounds: Normal heart sounds. No murmur. No friction rub. No gallop.   Pulmonary:     Effort: Pulmonary effort is normal.     Breath sounds: Normal breath sounds. No wheezing or rales.  Abdominal:     General: Bowel sounds are normal.     Palpations: Abdomen is soft.     Tenderness: There is no  abdominal tenderness. There is no right CVA tenderness, left CVA tenderness, guarding or rebound.  Skin:    General: Skin is warm and dry.  Neurological:     Mental Status: She is alert and oriented to person, place, and time.  Psychiatric:        Behavior: Behavior normal.        Judgment: Judgment normal.      UC Treatments / Results  Labs (all labs ordered are listed, but only abnormal results are displayed) Labs Reviewed  POCT URINALYSIS DIP (MANUAL ENTRY) - Abnormal; Notable for the following components:      Result Value   Clarity, UA cloudy (*)    Blood, UA large (*)    Protein Ur, POC =30 (*)    Leukocytes, UA Moderate (2+) (*)    All other components within normal limits  URINE CULTURE    EKG   Radiology No results found.  Procedures Procedures (including critical care time)  Medications Ordered in UC Medications - No data to display  Initial Impression / Assessment and Plan / UC Course  I have reviewed the triage vital signs and the nursing notes.  Pertinent labs & imaging results that were available during my care of the patient were reviewed by me and considered in my medical decision making (see chart for details).    Urine dipstick positive for UTI. Start keflex as directed. Push fluids. Return precautions  given.  Final Clinical Impressions(s) / UC Diagnoses   Final diagnoses:  Cystitis   ED Prescriptions    Medication Sig Dispense Auth. Provider   cephALEXin (KEFLEX) 500 MG capsule Take 1 capsule (500 mg total) by mouth 2 (two) times daily. 14 capsule Fuad Forget V, PA-C   phenazopyridine (PYRIDIUM) 200 MG tablet Take 1 tablet (200 mg total) by mouth 3 (three) times daily. 6 tablet Belinda FisherYu, Lynnel Zanetti V, PA-C     PDMP not reviewed this encounter.   Belinda FisherYu, Avyon Herendeen V, PA-C 09/14/19 1626

## 2019-09-14 NOTE — Discharge Instructions (Signed)
Your urine was positive for an urinary tract infection. Start keflex as directed. Keep hydrated, urine should be clear to pale yellow in color. Monitor for any worsening of symptoms, fever, worsening abdominal pain, nausea/vomiting, flank pain, follow up for reevaluation.  ° °

## 2019-09-17 LAB — URINE CULTURE: Culture: 70000 — AB

## 2019-09-22 NOTE — Therapy (Signed)
[place], [place], [address] Phone: [phone]   Fax:  [phone]  Physical Therapy Treatment/Discharge  Patient Details  Name: Tammy Schwartz MRN: [id number] Date of Birth: 18-Mar-1956 Referring Provider (PT): Charlott Rakes, MD   Encounter Date: 08/16/2019    Past Medical History:  Diagnosis Date  . Chronic back pain    HNp,spondylosis,radiculopathy  . COPD (chronic obstructive pulmonary disease) ([place])   . Hyperlipidemia    was on choloesterol meds last yr-samples given in office but not needed since  . Hypertension    takes Exforge daily  . Joint pain   . Shortness of breath    takes Singulair daily;with exertion  . Sickle cell trait ([place])   . Spondylolysis   . Tobacco use     Past Surgical History:  Procedure Laterality Date  . BACK SURGERY    . BREAST EXCISIONAL BIOPSY Left 2007   benign  . BREAST SURGERY Left    lumpectomy  . COLONOSCOPY    . DILATION AND CURETTAGE OF UTERUS    . LUMBAR LAMINECTOMY/DECOMPRESSION MICRODISCECTOMY  06/04/2012   Procedure: LUMBAR LAMINECTOMY/DECOMPRESSION MICRODISCECTOMY 1 LEVEL;  Surgeon: Winfield Cunas, MD;  Location: [facility];  Service: Neurosurgery;  Laterality: Left;  LEFT Lumbar five-sacral one diskectomy  . LUMBAR LAMINECTOMY/DECOMPRESSION MICRODISCECTOMY Right 06/16/2013   Procedure: RIGHT Lumbar Five-Sacral One Microdiskectomy;  Surgeon: Winfield Cunas, MD;  Location: [facility];  Service: Neurosurgery;  Laterality: Right;  RIGHT Lumbar Five-Sacral One Microdiskectomy  . LUMBAR LAMINECTOMY/DECOMPRESSION MICRODISCECTOMY Right 01/20/2014   Procedure: LUMBAR FOUR TO LUMBAR FIVE LUMBAR LAMINECTOMY/DECOMPRESSION MICRODISCECTOMY 1 LEVEL;  Surgeon: Winfield Cunas, MD;  Location: [facility];  Service: Neurosurgery;  Laterality: Right;  Right L45 laminectomy and foramintomy    There were no vitals filed for this  visit.                                 PT Long Term Goals - 08/01/19 1522      PT LONG TERM GOAL #1   Title  Independent with HEP    Baseline  needs HEP    Time  6    Period  Weeks    Status  New    Target Date  09/12/19      PT LONG TERM GOAL #2   Title  Improve FOTO outcome measure score to 41% or less limitation due to LBP    Baseline  61% limited    Time  6    Period  Weeks    Status  New    Target Date  09/12/19      PT LONG TERM GOAL #3   Title  Increase trunk flexion AROM at least 20 deg to improve ability for bending for chores    Baseline  45 deg    Time  6    Period  Weeks    Status  New    Target Date  09/12/19      PT LONG TERM GOAL #4   Title  Increase right knee strength at least 1/2 MMT grade to improve stability for gait to decrease fall risk    Baseline  4/5    Time  6    Period  Weeks    Status  New    Target Date  09/12/19      PT LONG TERM GOAL #5  Title  Tolerate standing and ambulation for grocery shopping for periods at least 20-30 minutes with pain 5-6/10 at worst    Baseline  8-9/10    Time  6    Period  Weeks    Status  New    Target Date  09/12/19              Patient will benefit from skilled therapeutic intervention in order to improve the following deficits and impairments:  Pain, Impaired flexibility, Difficulty walking, Decreased strength, Decreased range of motion, Decreased activity tolerance  Visit Diagnosis: Radiculopathy, lumbar region  Muscle weakness (generalized)  Difficulty in walking, not elsewhere classified     Problem List Patient Active Problem List   Diagnosis Date Noted  . Screening breast examination 07/26/2019  . Cellulitis 05/17/2018  . Lumbar radiculopathy 02/27/2017  . Poor dentition 02/11/2017  . Acute bronchitis   . Chest pain 05/07/2016  . Chest pain at rest 05/07/2016  . Essential hypertension   . Hyperlipidemia   . Tobacco use   . Spondylolysis   .  Chronic obstructive pulmonary disease ([place])   . Lumbar spondylosis 01/20/2014  . HNP (herniated nucleus pulposus), lumbar 06/04/2012        PHYSICAL THERAPY DISCHARGE SUMMARY  Visits from Start of Care: 2  Current functional level related to goals / functional outcomes: Patient did not return for further therapy after last visit 08/16/19.   Remaining deficits: NA   Education / Equipment: NA Plan:                                                    Patient goals were not met. Patient is being discharged due to not returning since the last visit.  ?????            Beaulah Dinning, PT, DPT 09/22/19 11:30 AM       [facility] [address] [place], [place], [address] Phone: [phone]   Fax:  [phone]  Name: Kylah Maresh MRN: [id number] Date of Birth: [birth year]

## 2019-09-26 MED FILL — ?DULOXETINE HCL 60 MG CPEP: 60 | 30 days supply | Qty: 30 | Fill #2

## 2019-09-26 MED FILL — ?CARVEDILOL 3.125 MG TABLET: 3.125 | 30 days supply | Qty: 60 | Fill #0

## 2019-09-26 MED FILL — PANTOPRAZOLE SOD DR 40 MG T: 40 | 30 days supply | Qty: 30 | Fill #5

## 2019-09-30 ENCOUNTER — Ambulatory Visit
Admission: EM | Admit: 2019-09-30 | Discharge: 2019-09-30 | Disposition: A | Discharge status: Home / Self Care | Payer: No Typology Code available for payment source | Attending: Physician Assistant | Admitting: Physician Assistant

## 2019-09-30 ENCOUNTER — Other Ambulatory Visit: Payer: Self-pay

## 2019-09-30 DIAGNOSIS — N390 Urinary tract infection, site not specified: Secondary | ICD-10-CM | POA: Insufficient documentation

## 2019-09-30 DIAGNOSIS — B9689 Other specified bacterial agents as the cause of diseases classified elsewhere: Secondary | ICD-10-CM

## 2019-09-30 LAB — POCT URINALYSIS DIP (MANUAL ENTRY)
Glucose, UA: 100 mg/dL — AB
Nitrite, UA: POSITIVE — AB
Protein Ur, POC: 100 mg/dL — AB
Spec Grav, UA: 1.02 (ref 1.010–1.025)
Urobilinogen, UA: 2 E.U./dL — AB
pH, UA: 5.5 (ref 5.0–8.0)

## 2019-09-30 MED ORDER — SULFAMETHOXAZOLE-TRIMETHOPRIM 800-160 MG PO TABS: 1.0000 | ORAL_TABLET | Freq: Two times a day (BID) | ORAL | 0 refills | Status: DC

## 2019-09-30 MED FILL — ?SULFAMETHOXAZOLE-TMP DS TB: 800-160 | 10 days supply | Qty: 20 | Fill #0

## 2019-09-30 NOTE — ED Provider Notes (Signed)
[name]    CSN: [id number] Arrival date & time: 09/30/19  1344      History   Chief Complaint Chief Complaint  Patient presents with  . Urinary Frequency    HPI Tammy Schwartz is a 63 y.o. female.   The history is provided by the patient. No language interpreter was used.  Urinary Frequency This is a new problem. The problem occurs constantly. Pertinent negatives include no abdominal pain. Nothing aggravates the symptoms. Nothing relieves the symptoms. She has tried nothing for the symptoms. The treatment provided no relief.    Past Medical History:  Diagnosis Date  . Chronic back pain    HNp,spondylosis,radiculopathy  . COPD (chronic obstructive pulmonary disease) ([place])   . Hyperlipidemia    was on choloesterol meds last yr-samples given in office but not needed since  . Hypertension    takes Exforge daily  . Joint pain   . Shortness of breath    takes Singulair daily;with exertion  . Sickle cell trait ([place])   . Spondylolysis   . Tobacco use     Patient Active Problem List   Diagnosis Date Noted  . Screening breast examination 07/26/2019  . Cellulitis 05/17/2018  . Lumbar radiculopathy 02/27/2017  . Poor dentition 02/11/2017  . Acute bronchitis   . Chest pain 05/07/2016  . Chest pain at rest 05/07/2016  . Essential hypertension   . Hyperlipidemia   . Tobacco use   . Spondylolysis   . Chronic obstructive pulmonary disease ([place])   . Lumbar spondylosis 01/20/2014  . HNP (herniated nucleus pulposus), lumbar 06/04/2012    Past Surgical History:  Procedure Laterality Date  . BACK SURGERY    . BREAST EXCISIONAL BIOPSY Left 2007   benign  . BREAST SURGERY Left    lumpectomy  . COLONOSCOPY    . DILATION AND CURETTAGE OF UTERUS    . LUMBAR LAMINECTOMY/DECOMPRESSION MICRODISCECTOMY  06/04/2012   Procedure: LUMBAR LAMINECTOMY/DECOMPRESSION MICRODISCECTOMY 1 LEVEL;  Surgeon: Winfield Cunas, MD;  Location: [facility];  Service:  Neurosurgery;  Laterality: Left;  LEFT Lumbar five-sacral one diskectomy  . LUMBAR LAMINECTOMY/DECOMPRESSION MICRODISCECTOMY Right 06/16/2013   Procedure: RIGHT Lumbar Five-Sacral One Microdiskectomy;  Surgeon: Winfield Cunas, MD;  Location: [facility];  Service: Neurosurgery;  Laterality: Right;  RIGHT Lumbar Five-Sacral One Microdiskectomy  . LUMBAR LAMINECTOMY/DECOMPRESSION MICRODISCECTOMY Right 01/20/2014   Procedure: LUMBAR FOUR TO LUMBAR FIVE LUMBAR LAMINECTOMY/DECOMPRESSION MICRODISCECTOMY 1 LEVEL;  Surgeon: Winfield Cunas, MD;  Location: [facility];  Service: Neurosurgery;  Laterality: Right;  Right L45 laminectomy and foramintomy    OB History    Gravida  2   Para      Term      Preterm      AB      Living  2     SAB      TAB      Ectopic      Multiple      Live Births  2            Home Medications    Prior to Admission medications   Medication Sig Start Date End Date Taking? Authorizing Provider  amLODipine (NORVASC) 10 MG tablet Take 1 tablet (10 mg total) by mouth daily. 07/05/19   Charlott Rakes, MD  aspirin EC 81 MG tablet Take 162 mg by mouth daily.    [provider]  benzonatate (TESSALON) 100 MG capsule TAKE 1 CAPSULE BY MOUTH 2 TIMES DAILY  AS NEEDED FOR COUGH. 08/29/19   Hoy Register, MD  carvedilol (COREG) 3.125 MG tablet Take 1 tablet (3.125 mg total) by mouth 2 (two) times daily with a meal. 07/05/19   Hoy Register, MD  cephALEXin (KEFLEX) 500 MG capsule Take 1 capsule (500 mg total) by mouth 2 (two) times daily. 09/14/19   Cathie Hoops, Amy V, PA-C  cetirizine (ZYRTEC) 10 MG tablet TAKE 1 TABLET BY MOUTH DAILY. 06/08/19   Hoy Register, MD  chlorhexidine (HIBICLENS) 4 % external liquid Apply topically daily as needed. Dilute 10-15 mL in water, Use daily when bathing for 1-2 weeks 12/29/18   Domenick Gong, MD  DULoxetine (CYMBALTA) 60 MG capsule Take 1 capsule (60 mg total) by mouth daily. 07/05/19   Hoy Register, MD  fluticasone  (FLONASE) 50 MCG/ACT nasal spray Place 1 spray into both nostrils daily. 03/31/18   Hoy Register, MD  fluticasone furoate-vilanterol (BREO ELLIPTA) 100-25 MCG/INH AEPB Inhale 1 puff into the lungs daily. 07/05/19   Hoy Register, MD  gabapentin (NEURONTIN) 300 MG capsule Take 2 capsules (600 mg total) by mouth 3 (three) times daily. 07/05/19   Hoy Register, MD  hydrOXYzine (ATARAX/VISTARIL) 25 MG tablet Take 1 tablet (25 mg total) by mouth every 8 (eight) hours as needed. 07/05/19   Hoy Register, MD  lactulose (CHRONULAC) 10 GM/15ML solution Take 15 mLs (10 g total) by mouth 2 (two) times daily as needed for mild constipation. 06/23/17   Hoy Register, MD  meclizine (ANTIVERT) 25 MG tablet Take 1 tablet (25 mg total) by mouth 3 (three) times daily as needed for dizziness. 07/14/18   Hoy Register, MD  mirtazapine (REMERON) 30 MG tablet Take 1 tablet (30 mg total) by mouth at bedtime. 07/05/19   Hoy Register, MD  montelukast (SINGULAIR) 10 MG tablet Take 10 mg by mouth daily.     [provider]  Multiple Vitamin (MULTIVITAMIN WITH MINERALS) TABS Take 1 tablet by mouth daily.    [provider]  pantoprazole (PROTONIX) 40 MG tablet Take 1 tablet (40 mg total) by mouth daily for 30 days. 11/08/18 04/01/19  Hoy Register, MD  phenazopyridine (PYRIDIUM) 200 MG tablet Take 1 tablet (200 mg total) by mouth 3 (three) times daily. 09/14/19   Cathie Hoops, Amy V, PA-C  polyethylene glycol powder (GLYCOLAX/MIRALAX) powder Take 17 g by mouth daily. 06/09/17   Hoy Register, MD  sulfamethoxazole-trimethoprim (BACTRIM DS) 800-160 MG tablet Take 1 tablet by mouth 2 (two) times daily. 09/30/19   Elson Areas, PA-C  tiZANidine (ZANAFLEX) 4 MG tablet Take 1 tablet (4 mg total) by mouth every 8 (eight) hours as needed for muscle spasms. 07/05/19   Hoy Register, MD  traMADol (ULTRAM) 50 MG tablet Take 1 tablet (50 mg total) by mouth at bedtime. 10/01/18   Hoy Register, MD  triamcinolone cream  (KENALOG) 0.1 % Apply 1 application topically 2 (two) times daily. 04/21/19   Hoy Register, MD  VENTOLIN HFA 108 (90 Base) MCG/ACT inhaler INHALE 2 PUFFS INTO THE LUNGS EVERY 6 HOURS AS NEEDED 03/02/19   Hoy Register, MD    Family History Family History  Problem Relation Age of Onset  . Cancer - Other Mother   . CAD Father        Died age 34  . Hypertension Sister     Social History Social History   Tobacco Use  . Smoking status: Current Every Day Smoker    Packs/day: 0.25    Years: 20.00    Pack years:  5.00    Types: Cigarettes  . Smokeless tobacco: Never Used  Substance Use Topics  . Alcohol use: No  . Drug use: No     Allergies   Patient has no known allergies.   Review of Systems Review of Systems  Gastrointestinal: Negative for abdominal pain.  Genitourinary: Positive for frequency.  All other systems reviewed and are negative.    Physical Exam Triage Vital Signs ED Triage Vitals [09/30/19 1444]  Enc Vitals Group     BP (!) 157/82     Pulse Rate 89     Resp 18     Temp 98.6 F (37 C)     Temp Source Oral     SpO2 95 %     Weight      Height      Head Circumference      Peak Flow      Pain Score 9     Pain Loc      Pain Edu?      Excl. in [place]?    No data found.  Updated Vital Signs BP (!) 157/82 (BP Location: Left Arm)   Pulse 89   Temp 98.6 F (37 C) (Oral)   Resp 18   LMP 03/17/2012   SpO2 95%   Visual Acuity Right Eye Distance:   Left Eye Distance:   Bilateral Distance:    Right Eye Near:   Left Eye Near:    Bilateral Near:     Physical Exam Vitals and nursing note reviewed.  Constitutional:      General: She is not in acute distress.    Appearance: She is well-developed.  HENT:     Head: Normocephalic and atraumatic.  Eyes:     Conjunctiva/sclera: Conjunctivae normal.  Cardiovascular:     Rate and Rhythm: Normal rate and regular rhythm.     Heart sounds: No murmur.  Pulmonary:     Effort: Pulmonary effort is  normal. No respiratory distress.     Breath sounds: Normal breath sounds.  Abdominal:     Palpations: Abdomen is soft.     Tenderness: There is no abdominal tenderness.  Musculoskeletal:     Cervical back: Neck supple.  Skin:    General: Skin is warm and dry.  Neurological:     Mental Status: She is alert.  Psychiatric:        Mood and Affect: Mood normal.      UC Treatments / Results  Labs (all labs ordered are listed, but only abnormal results are displayed) Labs Reviewed  POCT URINALYSIS DIP (MANUAL ENTRY) - Abnormal; Notable for the following components:      Result Value   Color, UA orange (*)    Clarity, UA cloudy (*)    Glucose, UA =100 (*)    Bilirubin, UA small (*)    Ketones, POC UA trace (5) (*)    Blood, UA trace-intact (*)    Protein Ur, POC =100 (*)    Urobilinogen, UA 2.0 (*)    Nitrite, UA Positive (*)    Leukocytes, UA Large (3+) (*)    All other components within normal limits  URINE CULTURE    EKG   Radiology No results found.  Procedures Procedures (including critical care time)  Medications Ordered in UC Medications - No data to display  Initial Impression / Assessment and Plan / UC Course  I have reviewed the triage vital signs and the nursing notes.  Pertinent labs & imaging results  that were available during my care of the patient were reviewed by me and considered in my medical decision making (see chart for details).     (Pt recently on keflex, Pt given rx for bactrim) Final Clinical Impressions(s) / UC Diagnoses   Final diagnoses:  Urinary tract infection without hematuria, site unspecified     Discharge Instructions     Return if any problems.    ED Prescriptions    Medication Sig Dispense Auth. Provider   sulfamethoxazole-trimethoprim (BACTRIM DS) 800-160 MG tablet Take 1 tablet by mouth 2 (two) times daily. 20 tablet Elson Areas, [place]     PDMP not reviewed this encounter.  An After Visit Summary was printed  and given to the patient.    Elson Areas, [place] 09/30/19 [phone]

## 2019-09-30 NOTE — ED Triage Notes (Signed)
Pt states tx'd for a bladder infection 12/02 and started having pain and pressure on urination again last night

## 2019-09-30 NOTE — Discharge Instructions (Signed)
Return if any problems.

## 2019-10-07 LAB — URINE CULTURE: Culture: >=100000 — AB

## 2019-10-10 ENCOUNTER — Telehealth (HOSPITAL_COMMUNITY): Payer: Self-pay | Admitting: Emergency Medicine

## 2019-10-10 NOTE — Telephone Encounter (Signed)
Spoke with hallie PA regarding test results. If pt still having symptoms, may send macrobid. Called and spoke to patient regarding results and symptoms. Pt states she is symptom free and back to normal. No further intervention necessary.

## 2019-10-11 ENCOUNTER — Other Ambulatory Visit: Payer: Self-pay | Admitting: Family Medicine

## 2019-10-11 MED FILL — BENZONATATE 100 MG CAPS: 100 | 10 days supply | Qty: 20 | Fill #0

## 2019-10-20 MED FILL — ?AMLODIPINE BESYLATE 10 MG: 10 | 30 days supply | Qty: 30 | Fill #0

## 2019-10-20 MED FILL — hydrOXYzine HCL 25 MG TABS: 25 | 30 days supply | Qty: 90 | Fill #1

## 2019-10-20 MED FILL — ?CARVEDILOL 3.125 MG TABLET: 3.125 | 30 days supply | Qty: 60 | Fill #1

## 2019-10-20 MED FILL — ?DULOXETINE HCL 60 MG CPEP: 60 | 30 days supply | Qty: 30 | Fill #3

## 2019-10-20 MED FILL — ?CETIRIZINE HCL 10 MG TABLE: 10 | 30 days supply | Qty: 30 | Fill #1

## 2019-11-08 ENCOUNTER — Other Ambulatory Visit: Payer: Self-pay | Admitting: Family Medicine

## 2019-11-08 DIAGNOSIS — J449 Chronic obstructive pulmonary disease, unspecified: Secondary | ICD-10-CM

## 2019-11-08 MED FILL — $BREO ELLIPTA 100-25 MCG IH: 100-25 MCG | 90 days supply | Qty: 180 | Fill #1

## 2019-11-08 MED FILL — TRIAMCINOLONE ACETONIDE 0.1: 0.1 | 14 days supply | Qty: 30 | Fill #0

## 2019-11-09 NOTE — Telephone Encounter (Signed)
Please approve refill if patient may take chronically.

## 2019-11-11 MED FILL — BENZONATATE 100 MG CAPS: 100 | 10 days supply | Qty: 20 | Fill #0

## 2019-11-11 MED FILL — $VENTOLIN HFA 18G INHALER: 108 (90 BAS | 25 days supply | Qty: 18 | Fill #0

## 2019-11-28 MED FILL — DULoxetine HCL 60 MG CPEP: 60 | 30 days supply | Qty: 30 | Fill #4

## 2019-11-29 ENCOUNTER — Other Ambulatory Visit: Payer: Self-pay

## 2019-11-29 ENCOUNTER — Ambulatory Visit: Payer: Self-pay | Attending: Family Medicine | Admitting: Family Medicine

## 2019-11-29 ENCOUNTER — Other Ambulatory Visit: Payer: Self-pay | Admitting: Family Medicine

## 2019-11-29 ENCOUNTER — Ambulatory Visit: Payer: No Typology Code available for payment source

## 2019-11-29 ENCOUNTER — Encounter: Payer: Self-pay | Admitting: Family Medicine

## 2019-11-29 DIAGNOSIS — M5416 Radiculopathy, lumbar region: Secondary | ICD-10-CM

## 2019-11-29 DIAGNOSIS — F419 Anxiety disorder, unspecified: Secondary | ICD-10-CM

## 2019-11-29 DIAGNOSIS — I10 Essential (primary) hypertension: Secondary | ICD-10-CM

## 2019-11-29 DIAGNOSIS — L299 Pruritus, unspecified: Secondary | ICD-10-CM

## 2019-11-29 DIAGNOSIS — R42 Dizziness and giddiness: Secondary | ICD-10-CM

## 2019-11-29 MED ORDER — GABAPENTIN 300 MG PO CAPS: 600.0000 mg | ORAL_CAPSULE | Freq: Three times a day (TID) | ORAL | 3 refills | Status: DC

## 2019-11-29 MED ORDER — TRIAMCINOLONE ACETONIDE 0.1 % EX CREA: 1.0000 "application " | TOPICAL_CREAM | Freq: Two times a day (BID) | CUTANEOUS | 0 refills | Status: DC

## 2019-11-29 MED ORDER — MECLIZINE HCL 25 MG PO TABS: 25.0000 mg | ORAL_TABLET | Freq: Three times a day (TID) | ORAL | 1 refills | Status: AC | PRN

## 2019-11-29 MED ORDER — AMLODIPINE BESYLATE 10 MG PO TABS: 10.0000 mg | ORAL_TABLET | Freq: Every day | ORAL | 1 refills | Status: DC

## 2019-11-29 MED ORDER — PANTOPRAZOLE SODIUM 40 MG PO TBEC: 40.0000 mg | DELAYED_RELEASE_TABLET | Freq: Every day | ORAL | 6 refills | Status: DC

## 2019-11-29 MED ORDER — CARVEDILOL 3.125 MG PO TABS: 3.1250 mg | ORAL_TABLET | Freq: Two times a day (BID) | ORAL | 1 refills | Status: DC

## 2019-11-29 MED ORDER — TIZANIDINE HCL 4 MG PO TABS: 4.0000 mg | ORAL_TABLET | Freq: Three times a day (TID) | ORAL | 6 refills | Status: DC | PRN

## 2019-11-29 MED ORDER — DULOXETINE HCL 60 MG PO CPEP: 60.0000 mg | ORAL_CAPSULE | Freq: Every day | ORAL | 1 refills | Status: DC

## 2019-11-29 MED ORDER — HYDROXYZINE HCL 25 MG PO TABS: 25.0000 mg | ORAL_TABLET | Freq: Three times a day (TID) | ORAL | 2 refills | Status: DC | PRN

## 2019-11-29 MED FILL — ?MECLIZINE 25MG TAB: 25 | 20 days supply | Qty: 60 | Fill #0

## 2019-11-29 MED FILL — ?AMLODIPINE BESYL 10MG TABL: 10 | 30 days supply | Qty: 30 | Fill #0

## 2019-11-29 MED FILL — hydrOXYzine HCL 25 MG TABS: 25 | 30 days supply | Qty: 90 | Fill #0

## 2019-11-29 MED FILL — tiZANidine HCL 4 MG TABS: 4 | 30 days supply | Qty: 90 | Fill #0

## 2019-11-29 MED FILL — ?CARVEDILOL 3.125 MG TABLET: 3.125 | 30 days supply | Qty: 60 | Fill #0

## 2019-11-29 MED FILL — GABAPENTIN 300 MG CAPSULE: 300 | 30 days supply | Qty: 180 | Fill #0

## 2019-11-29 MED FILL — TRIAMCINOLONE ACETONIDE 0.1: 0.1 | 15 days supply | Qty: 80 | Fill #0

## 2019-11-29 MED FILL — ?PANTOPRAZOLE SO DR 40MG TA: 40 | 30 days supply | Qty: 30 | Fill #0

## 2019-11-29 NOTE — Progress Notes (Signed)
pain back and legs, has rash on hands.

## 2019-11-29 NOTE — Progress Notes (Signed)
Subjective:  Patient ID: Tammy Schwartz, female    DOB: 01-01-1956  Age: 64 y.o. MRN: [id number]  CC: Hypertension   HPI Tammy Schwartz is a 64 year old female with history of hypertension, tobacco abuse, COPD, lumbar spondylosis status post back surgery who presents today for follow-up visit.  She complains of pain in her hands which she states is constant but no swelling of the joints of her hands.  She also has a rash on her hands of unknown duration and rash is sometimes itchy. With regards to her back pain, PT made her pain worse and she is not open to epidural spinal injections.  She is currently on Cymbalta, tizanidine and gabapentin.  She states gabapentin is sometimes effective. LBP is 8/10 and sometimes radiates down either of her lower extremities. She denies recent falls. COPD is controlled with no flares.  Past Medical History:  Diagnosis Date  . Chronic back pain    HNp,spondylosis,radiculopathy  . COPD (chronic obstructive pulmonary disease) (HCC)   . Hyperlipidemia    was on choloesterol meds last yr-samples given in office but not needed since  . Hypertension    takes Exforge daily  . Joint pain   . Shortness of breath    takes Singulair daily;with exertion  . Sickle cell trait (HCC)   . Spondylolysis   . Tobacco use     Past Surgical History:  Procedure Laterality Date  . BACK SURGERY    . BREAST EXCISIONAL BIOPSY Left 2007   benign  . BREAST SURGERY Left    lumpectomy  . COLONOSCOPY    . DILATION AND CURETTAGE OF UTERUS    . LUMBAR LAMINECTOMY/DECOMPRESSION MICRODISCECTOMY  06/04/2012   Procedure: LUMBAR LAMINECTOMY/DECOMPRESSION MICRODISCECTOMY 1 LEVEL;  Surgeon: Carmela Hurt, MD;  Location: [facility];  Service: Neurosurgery;  Laterality: Left;  LEFT Lumbar five-sacral one diskectomy  . LUMBAR LAMINECTOMY/DECOMPRESSION MICRODISCECTOMY Right 06/16/2013   Procedure: RIGHT Lumbar Five-Sacral One Microdiskectomy;  Surgeon: Carmela Hurt, MD;  Location: [facility];  Service: Neurosurgery;  Laterality: Right;  RIGHT Lumbar Five-Sacral One Microdiskectomy  . LUMBAR LAMINECTOMY/DECOMPRESSION MICRODISCECTOMY Right 01/20/2014   Procedure: LUMBAR FOUR TO LUMBAR FIVE LUMBAR LAMINECTOMY/DECOMPRESSION MICRODISCECTOMY 1 LEVEL;  Surgeon: Carmela Hurt, MD;  Location: [facility];  Service: Neurosurgery;  Laterality: Right;  Right L45 laminectomy and foramintomy    Family History  Problem Relation Age of Onset  . Cancer - Other Mother   . CAD Father        Died age 70  . Hypertension Sister     No Known Allergies  Outpatient Medications Prior to Visit  Medication Sig Dispense Refill  . cetirizine (ZYRTEC) 10 MG tablet TAKE 1 TABLET BY MOUTH DAILY. 30 tablet 2  . chlorhexidine (HIBICLENS) 4 % external liquid Apply topically daily as needed. Dilute 10-15 mL in water, Use daily when bathing for 1-2 weeks 120 mL 0  . DULoxetine (CYMBALTA) 60 MG capsule Take 1 capsule (60 mg total) by mouth daily. 90 capsule 1  . fluticasone (FLONASE) 50 MCG/ACT nasal spray Place 1 spray into both nostrils daily. 16 g 0  . fluticasone furoate-vilanterol (BREO ELLIPTA) 100-25 MCG/INH AEPB Inhale 1 puff into the lungs daily. 90 each 3  . gabapentin (NEURONTIN) 300 MG capsule Take 2 capsules (600 mg total) by mouth 3 (three) times daily. 180 capsule 3  . hydrOXYzine (ATARAX/VISTARIL) 25 MG tablet Take 1 tablet (25 mg total) by mouth every 8 (eight) hours as  needed. 90 tablet 2  . lactulose (CHRONULAC) 10 GM/15ML solution Take 15 mLs (10 g total) by mouth 2 (two) times daily as needed for mild constipation. 946 mL 0  . meclizine (ANTIVERT) 25 MG tablet Take 1 tablet (25 mg total) by mouth 3 (three) times daily as needed for dizziness. 60 tablet 1  . mirtazapine (REMERON) 30 MG tablet Take 1 tablet (30 mg total) by mouth at bedtime. 30 tablet 6  . montelukast (SINGULAIR) 10 MG tablet Take 10 mg by mouth daily.     . Multiple Vitamin (MULTIVITAMIN  WITH MINERALS) TABS Take 1 tablet by mouth daily.    Marland Kitchen tiZANidine (ZANAFLEX) 4 MG tablet Take 1 tablet (4 mg total) by mouth every 8 (eight) hours as needed for muscle spasms. 90 tablet 6  . VENTOLIN HFA 108 (90 Base) MCG/ACT inhaler INHALE 2 PUFFS INTO THE LUNGS EVERY 6 HOURS AS NEEDED 36 g 2  . amLODipine (NORVASC) 10 MG tablet Take 1 tablet (10 mg total) by mouth daily. 90 tablet 1  . aspirin EC 81 MG tablet Take 162 mg by mouth daily.    . benzonatate (TESSALON) 100 MG capsule TAKE 1 CAPSULE BY MOUTH 2 TIMES DAILY AS NEEDED FOR COUGH. (Patient not taking: Reported on 11/29/2019) 20 capsule 0  . carvedilol (COREG) 3.125 MG tablet Take 1 tablet (3.125 mg total) by mouth 2 (two) times daily with a meal. 180 tablet 1  . cephALEXin (KEFLEX) 500 MG capsule Take 1 capsule (500 mg total) by mouth 2 (two) times daily. 14 capsule 0  . pantoprazole (PROTONIX) 40 MG tablet Take 1 tablet (40 mg total) by mouth daily for 30 days. 30 tablet 6  . phenazopyridine (PYRIDIUM) 200 MG tablet Take 1 tablet (200 mg total) by mouth 3 (three) times daily. (Patient not taking: Reported on 11/29/2019) 6 tablet 0  . polyethylene glycol powder (GLYCOLAX/MIRALAX) powder Take 17 g by mouth daily. 3350 g 1  . sulfamethoxazole-trimethoprim (BACTRIM DS) 800-160 MG tablet Take 1 tablet by mouth 2 (two) times daily. (Patient not taking: Reported on 11/29/2019) 20 tablet 0  . traMADol (ULTRAM) 50 MG tablet Take 1 tablet (50 mg total) by mouth at bedtime. (Patient not taking: Reported on 11/29/2019) 30 tablet 1  . triamcinolone cream (KENALOG) 0.1 % Apply 1 application topically 2 (two) times daily. (Patient not taking: Reported on 11/29/2019) 80 g 0   No facility-administered medications prior to visit.     ROS Review of Systems  Constitutional: Negative for activity change, appetite change and fatigue.  HENT: Negative for congestion, sinus pressure and sore throat.   Eyes: Negative for visual disturbance.  Respiratory: Negative  for cough, chest tightness, shortness of breath and wheezing.   Cardiovascular: Negative for chest pain and palpitations.  Gastrointestinal: Negative for abdominal distention, abdominal pain and constipation.  Endocrine: Negative for polydipsia.  Genitourinary: Negative for dysuria and frequency.  Musculoskeletal: Positive for back pain. Negative for arthralgias.  Skin: Positive for rash.  Neurological: Negative for tremors, light-headedness and numbness.  Hematological: Does not bruise/bleed easily.  Psychiatric/Behavioral: Negative for agitation and behavioral problems.    Objective:  BP 130/71   Pulse 84   Wt 117 lb (53.1 kg)   LMP 03/17/2012   SpO2 97%   BMI 22.11 kg/m   BP/Weight 11/29/2019 09/30/2019 40/06/8118  Systolic BP 147 829 562  Diastolic BP 71 82 81  Wt. (Lbs) 117 - -  BMI 22.11 - -      Physical  Exam Constitutional:      Appearance: She is well-developed.  Neck:     Vascular: No JVD.  Cardiovascular:     Rate and Rhythm: Normal rate.     Heart sounds: Normal heart sounds. No murmur.  Pulmonary:     Effort: Pulmonary effort is normal.     Breath sounds: Normal breath sounds. No wheezing or rales.  Chest:     Chest wall: No tenderness.  Abdominal:     General: Bowel sounds are normal. There is no distension.     Palpations: Abdomen is soft. There is no mass.     Tenderness: There is no abdominal tenderness.  Musculoskeletal:        General: Tenderness (lumbar spine; positive straight leg raise on the left) present.     Right lower leg: No edema.     Left lower leg: No edema.  Skin:    Comments: Rash on dorsum of first webspace of hands bilaterally  Neurological:     Mental Status: She is alert and oriented to person, place, and time.  Psychiatric:        Mood and Affect: Mood normal.     CMP Latest Ref Rng & Units 08/26/2018 07/08/2018 07/07/2018  Glucose 65 - 99 mg/dL 80 272(Z) -  BUN 8 - 27 mg/dL 5(L) 8 -  Creatinine 3.66 - 1.00 mg/dL 4.40  3.47 4.25  Sodium 134 - 144 mmol/L 144 145 -  Potassium 3.5 - 5.2 mmol/L 3.5 4.6 -  Chloride 96 - 106 mmol/L 103 109 -  CO2 20 - 29 mmol/L 22 28 -  Calcium 8.7 - 10.3 mg/dL 9.5 9.5(G) -  Total Protein 6.5 - 8.1 g/dL - - -  Total Bilirubin 0.3 - 1.2 mg/dL - - -  Alkaline Phos 38 - 126 U/L - - -  AST 15 - 41 U/L - - -  ALT 0 - 44 U/L - - -    Lipid Panel     Component Value Date/Time   CHOL 204 (H) 01/01/2018 1223   TRIG 92 01/01/2018 1223   HDL 102 01/01/2018 1223   CHOLHDL 2.0 01/01/2018 1223   CHOLHDL 2.3 05/07/2016 0739   VLDL 20 05/07/2016 0739   LDLCALC 84 01/01/2018 1223    CBC    Component Value Date/Time   WBC 9.1 07/07/2018 1813   RBC 4.10 07/07/2018 1813   HGB 12.3 07/07/2018 1813   HGB 12.7 06/09/2017 1446   HCT 36.5 07/07/2018 1813   HCT 38.4 06/09/2017 1446   PLT 281 07/07/2018 1813   PLT 254 06/09/2017 1446   MCV 89.0 07/07/2018 1813   MCV 87 06/09/2017 1446   MCH 30.0 07/07/2018 1813   MCHC 33.7 07/07/2018 1813   RDW 16.2 (H) 07/07/2018 1813   RDW 15.8 (H) 06/09/2017 1446   LYMPHSABS 2.1 05/20/2018 0457   LYMPHSABS 2.4 06/09/2017 1446   MONOABS 0.4 05/20/2018 0457   EOSABS 0.3 05/20/2018 0457   EOSABS 0.2 06/09/2017 1446   BASOSABS 0.0 05/20/2018 0457   BASOSABS 0.0 06/09/2017 1446    No results found for: HGBA1C  Assessment & Plan:   1. Essential hypertension Controlled Counseled on blood pressure goal of less than 130/80, low-sodium, DASH diet, medication compliance, 150 minutes of moderate intensity exercise per week. Discussed medication compliance, adverse effects. - Basic Metabolic Panel - amLODipine (NORVASC) 10 MG tablet; Take 1 tablet (10 mg total) by mouth daily.  Dispense: 90 tablet; Refill: 1 - carvedilol (COREG) 3.125  MG tablet; Take 1 tablet (3.125 mg total) by mouth 2 (two) times daily with a meal.  Dispense: 180 tablet; Refill: 1  2. Lumbar radiculopathy Uncontrolled PT was ineffective Declines ESI Advised to apply  heat - DULoxetine (CYMBALTA) 60 MG capsule; Take 1 capsule (60 mg total) by mouth daily.  Dispense: 90 capsule; Refill: 1 - gabapentin (NEURONTIN) 300 MG capsule; Take 2 capsules (600 mg total) by mouth 3 (three) times daily.  Dispense: 180 capsule; Refill: 3 - tiZANidine (ZANAFLEX) 4 MG tablet; Take 1 tablet (4 mg total) by mouth every 8 (eight) hours as needed for muscle spasms.  Dispense: 90 tablet; Refill: 6  3. Anxiety Stable - hydrOXYzine (ATARAX/VISTARIL) 25 MG tablet; Take 1 tablet (25 mg total) by mouth every 8 (eight) hours as needed.  Dispense: 90 tablet; Refill: 2  4. Dizziness Controlled - meclizine (ANTIVERT) 25 MG tablet; Take 1 tablet (25 mg total) by mouth 3 (three) times daily as needed for dizziness.  Dispense: 60 tablet; Refill: 1  5. Pruritus Secondary to rash on dorsum of hand - triamcinolone cream (KENALOG) 0.1 %; Apply 1 application topically 2 (two) times daily.  Dispense: 80 g; Refill: 0     Hoy Register, MD, FAAFP. [facility] [place], [place] [phone]   11/29/2019, 3:07 PM

## 2019-11-30 LAB — BASIC METABOLIC PANEL
BUN/Creatinine Ratio: 7 — ABNORMAL LOW (ref 12–28)
BUN: 7 mg/dL — ABNORMAL LOW (ref 8–27)
CO2: 23 mmol/L (ref 20–29)
Calcium: 9.5 mg/dL (ref 8.7–10.3)
Chloride: 104 mmol/L (ref 96–106)
Creatinine, Ser: 0.98 mg/dL (ref 0.57–1.00)
GFR calc Af Amer: 71 mL/min/{1.73_m2} (ref >59)
GFR calc non Af Amer: 61 mL/min/{1.73_m2} (ref >59)
Glucose: 93 mg/dL (ref 65–99)
Potassium: 4.3 mmol/L (ref 3.5–5.2)
Sodium: 143 mmol/L (ref 134–144)

## 2019-12-01 ENCOUNTER — Telehealth: Payer: Self-pay

## 2019-12-01 NOTE — Telephone Encounter (Signed)
-----   Message from Hoy Register, MD sent at 12/01/2019  2:21 PM EST ----- Please inform the patient that labs are normal. Thank you.

## 2019-12-01 NOTE — Telephone Encounter (Signed)
Patient name and DOB has been verified Patient was informed of lab results. Patient had no questions.  

## 2019-12-22 ENCOUNTER — Other Ambulatory Visit: Payer: Self-pay

## 2019-12-22 ENCOUNTER — Ambulatory Visit
Admission: EM | Admit: 2019-12-22 | Discharge: 2019-12-22 | Disposition: A | Discharge status: Home / Self Care | Payer: No Typology Code available for payment source | Attending: Internal Medicine | Admitting: Internal Medicine

## 2019-12-22 ENCOUNTER — Encounter: Payer: Self-pay | Admitting: Emergency Medicine

## 2019-12-22 DIAGNOSIS — N390 Urinary tract infection, site not specified: Secondary | ICD-10-CM | POA: Insufficient documentation

## 2019-12-22 DIAGNOSIS — B9689 Other specified bacterial agents as the cause of diseases classified elsewhere: Secondary | ICD-10-CM

## 2019-12-22 DIAGNOSIS — I10 Essential (primary) hypertension: Secondary | ICD-10-CM

## 2019-12-22 LAB — POCT URINALYSIS DIP (MANUAL ENTRY)
Bilirubin, UA: NEGATIVE
Glucose, UA: 100 mg/dL — AB
Ketones, POC UA: NEGATIVE mg/dL
Nitrite, UA: POSITIVE — AB
Protein Ur, POC: 30 mg/dL — AB
Spec Grav, UA: 1.02 (ref 1.010–1.025)
Urobilinogen, UA: 1 E.U./dL
pH, UA: 6.5 (ref 5.0–8.0)

## 2019-12-22 MED ORDER — PHENAZOPYRIDINE HCL 200 MG PO TABS: 200.0000 mg | ORAL_TABLET | Freq: Three times a day (TID) | ORAL | 0 refills | Status: DC

## 2019-12-22 MED ORDER — CEPHALEXIN 500 MG PO CAPS: 500.0000 mg | ORAL_CAPSULE | Freq: Two times a day (BID) | ORAL | 0 refills | Status: AC

## 2019-12-22 NOTE — Discharge Instructions (Addendum)
Urine showed evidence of infection. We are treating you with keflex- twice daily for 1 week. Be sure to take full course. Stay hydrated- urine should be pale yellow to clear.   Pyridium as needed for pain/burning  Please return or follow up with your primary provider if symptoms not improving with treatment. Please return sooner if you have worsening of symptoms or develop fever, nausea, vomiting, abdominal pain, back pain, lightheadedness, dizziness.

## 2019-12-22 NOTE — ED Provider Notes (Signed)
[name]    CSN: [id number] Arrival date & time: 12/22/19  1349      History   Chief Complaint Chief Complaint  Patient presents with  . Dysuria    HPI Tammy Schwartz is a 64 y.o. female history of COPD, tobacco use, hypertension, hyperlipidemia presenting today for evaluation of dysuria.  Patient states that beginning yesterday she began to develop painful urination as well as lower abdominal/bladder pressure.  Reports feel similar to prior UTIs.  Had recent UTI in December, approximately 3 months ago.  Denies any vaginal bleeding.  Denies itching or irritation.  Denies fevers, nausea or vomiting.  Denies back pain different from typical.  HPI  Past Medical History:  Diagnosis Date  . Chronic back pain    HNp,spondylosis,radiculopathy  . COPD (chronic obstructive pulmonary disease) ([place])   . Hyperlipidemia    was on choloesterol meds last yr-samples given in office but not needed since  . Hypertension    takes Exforge daily  . Joint pain   . Shortness of breath    takes Singulair daily;with exertion  . Sickle cell trait ([place])   . Spondylolysis   . Tobacco use     Patient Active Problem List   Diagnosis Date Noted  . Screening breast examination 07/26/2019  . Cellulitis 05/17/2018  . Lumbar radiculopathy 02/27/2017  . Poor dentition 02/11/2017  . Acute bronchitis   . Chest pain 05/07/2016  . Chest pain at rest 05/07/2016  . Essential hypertension   . Hyperlipidemia   . Tobacco use   . Spondylolysis   . Chronic obstructive pulmonary disease ([place])   . Lumbar spondylosis 01/20/2014  . HNP (herniated nucleus pulposus), lumbar 06/04/2012    Past Surgical History:  Procedure Laterality Date  . BACK SURGERY    . BREAST EXCISIONAL BIOPSY Left 2007   benign  . BREAST SURGERY Left    lumpectomy  . COLONOSCOPY    . DILATION AND CURETTAGE OF UTERUS    . LUMBAR LAMINECTOMY/DECOMPRESSION MICRODISCECTOMY  06/04/2012   Procedure: LUMBAR  LAMINECTOMY/DECOMPRESSION MICRODISCECTOMY 1 LEVEL;  Surgeon: Winfield Cunas, MD;  Location: [place] [facility];  Service: Neurosurgery;  Laterality: Left;  LEFT Lumbar five-sacral one diskectomy  . LUMBAR LAMINECTOMY/DECOMPRESSION MICRODISCECTOMY Right 06/16/2013   Procedure: RIGHT Lumbar Five-Sacral One Microdiskectomy;  Surgeon: Winfield Cunas, MD;  Location: [place] [facility];  Service: Neurosurgery;  Laterality: Right;  RIGHT Lumbar Five-Sacral One Microdiskectomy  . LUMBAR LAMINECTOMY/DECOMPRESSION MICRODISCECTOMY Right 01/20/2014   Procedure: LUMBAR FOUR TO LUMBAR FIVE LUMBAR LAMINECTOMY/DECOMPRESSION MICRODISCECTOMY 1 LEVEL;  Surgeon: Winfield Cunas, MD;  Location: [place] [facility];  Service: Neurosurgery;  Laterality: Right;  Right L45 laminectomy and foramintomy    OB History    Gravida  2   Para      Term      Preterm      AB      Living  2     SAB      TAB      Ectopic      Multiple      Live Births  2            Home Medications    Prior to Admission medications   Medication Sig Start Date End Date Taking? Authorizing Provider  amLODipine (NORVASC) 10 MG tablet Take 1 tablet (10 mg total) by mouth daily. 11/29/19   Charlott Rakes, MD  aspirin EC 81 MG tablet Take 162 mg by mouth daily.  [provider]  benzonatate (TESSALON) 100 MG capsule TAKE 1 CAPSULE BY MOUTH 2 TIMES DAILY AS NEEDED FOR COUGH. Patient not taking: Reported on 11/29/2019 11/11/19   Hoy Register, MD  carvedilol (COREG) 3.125 MG tablet Take 1 tablet (3.125 mg total) by mouth 2 (two) times daily with a meal. 11/29/19   Hoy Register, MD  cephALEXin (KEFLEX) 500 MG capsule Take 1 capsule (500 mg total) by mouth 2 (two) times daily for 7 days. 12/22/19 12/29/19  Sala Tague C, PA-C  cetirizine (ZYRTEC) 10 MG tablet TAKE 1 TABLET BY MOUTH DAILY. 06/08/19   Hoy Register, MD  chlorhexidine (HIBICLENS) 4 % external liquid Apply topically daily as needed. Dilute 10-15 mL in water, Use daily when  bathing for 1-2 weeks 12/29/18   Domenick Gong, MD  DULoxetine (CYMBALTA) 60 MG capsule Take 1 capsule (60 mg total) by mouth daily. 11/29/19   Hoy Register, MD  fluticasone (FLONASE) 50 MCG/ACT nasal spray Place 1 spray into both nostrils daily. 03/31/18   Hoy Register, MD  fluticasone furoate-vilanterol (BREO ELLIPTA) 100-25 MCG/INH AEPB Inhale 1 puff into the lungs daily. 07/05/19   Hoy Register, MD  gabapentin (NEURONTIN) 300 MG capsule Take 2 capsules (600 mg total) by mouth 3 (three) times daily. 11/29/19   Hoy Register, MD  hydrOXYzine (ATARAX/VISTARIL) 25 MG tablet Take 1 tablet (25 mg total) by mouth every 8 (eight) hours as needed. 11/29/19   Hoy Register, MD  lactulose (CHRONULAC) 10 GM/15ML solution Take 15 mLs (10 g total) by mouth 2 (two) times daily as needed for mild constipation. 06/23/17   Hoy Register, MD  meclizine (ANTIVERT) 25 MG tablet Take 1 tablet (25 mg total) by mouth 3 (three) times daily as needed for dizziness. 11/29/19   Hoy Register, MD  mirtazapine (REMERON) 30 MG tablet Take 1 tablet (30 mg total) by mouth at bedtime. 07/05/19   Hoy Register, MD  montelukast (SINGULAIR) 10 MG tablet Take 10 mg by mouth daily.     [provider]  Multiple Vitamin (MULTIVITAMIN WITH MINERALS) TABS Take 1 tablet by mouth daily.    [provider]  pantoprazole (PROTONIX) 40 MG tablet Take 1 tablet (40 mg total) by mouth daily. 11/29/19 12/29/19  Hoy Register, MD  phenazopyridine (PYRIDIUM) 200 MG tablet Take 1 tablet (200 mg total) by mouth 3 (three) times daily. 12/22/19   Claudis Giovanelli C, PA-C  polyethylene glycol powder (GLYCOLAX/MIRALAX) powder Take 17 g by mouth daily. 06/09/17   Hoy Register, MD  tiZANidine (ZANAFLEX) 4 MG tablet Take 1 tablet (4 mg total) by mouth every 8 (eight) hours as needed for muscle spasms. 11/29/19   Hoy Register, MD  traMADol (ULTRAM) 50 MG tablet Take 1 tablet (50 mg total) by mouth at bedtime. Patient not  taking: Reported on 11/29/2019 10/01/18   Hoy Register, MD  triamcinolone cream (KENALOG) 0.1 % Apply 1 application topically 2 (two) times daily. 11/29/19   Hoy Register, MD  VENTOLIN HFA 108 (90 Base) MCG/ACT inhaler INHALE 2 PUFFS INTO THE LUNGS EVERY 6 HOURS AS NEEDED 11/11/19   Hoy Register, MD    Family History Family History  Problem Relation Age of Onset  . Cancer - Other Mother   . CAD Father        Died age 52  . Hypertension Sister     Social History Social History   Tobacco Use  . Smoking status: Current Every Day Smoker    Packs/day: 0.25    Years:  20.00    Pack years: 5.00    Types: Cigarettes  . Smokeless tobacco: Never Used  Substance Use Topics  . Alcohol use: No  . Drug use: No     Allergies   Patient has no known allergies.   Review of Systems Review of Systems  Constitutional: Negative for fever.  Respiratory: Negative for shortness of breath.   Cardiovascular: Negative for chest pain.  Gastrointestinal: Positive for abdominal pain. Negative for diarrhea, nausea and vomiting.  Genitourinary: Positive for dysuria. Negative for flank pain, genital sores, hematuria, menstrual problem, vaginal bleeding, vaginal discharge and vaginal pain.  Musculoskeletal: Negative for back pain.  Skin: Negative for rash.  Neurological: Negative for dizziness, light-headedness and headaches.     Physical Exam Triage Vital Signs ED Triage Vitals  Enc Vitals Group     BP 12/22/19 1405 (!) 145/81     Pulse Rate 12/22/19 1405 98     Resp 12/22/19 1405 18     Temp 12/22/19 1405 98.6 F (37 C)     Temp Source 12/22/19 1405 Temporal     SpO2 12/22/19 1405 98 %     Weight --      Height --      Head Circumference --      Peak Flow --      Pain Score 12/22/19 1406 0     Pain Loc --      Pain Edu? --      Excl. in GC? --    No data found.  Updated Vital Signs BP (!) 145/81 (BP Location: Right Arm)   Pulse 98   Temp 98.6 F (37 C) (Temporal)   Resp  18   LMP 03/17/2012   SpO2 98%   Visual Acuity Right Eye Distance:   Left Eye Distance:   Bilateral Distance:    Right Eye Near:   Left Eye Near:    Bilateral Near:     Physical Exam Vitals and nursing note reviewed.  Constitutional:      Appearance: She is well-developed.     Comments: No acute distress  HENT:     Head: Normocephalic and atraumatic.     Nose: Nose normal.  Eyes:     Conjunctiva/sclera: Conjunctivae normal.  Cardiovascular:     Rate and Rhythm: Normal rate.  Pulmonary:     Effort: Pulmonary effort is normal. No respiratory distress.  Abdominal:     General: There is no distension.     Comments: Soft, nondistended, tenderness to palpation over suprapubic area, nontender throughout right and left upper and lower quadrants No CVA tenderness  Musculoskeletal:        General: Normal range of motion.     Cervical back: Neck supple.  Skin:    General: Skin is warm and dry.  Neurological:     Mental Status: She is alert and oriented to person, place, and time.      UC Treatments / Results  Labs (all labs ordered are listed, but only abnormal results are displayed) Labs Reviewed  POCT URINALYSIS DIP (MANUAL ENTRY) - Abnormal; Notable for the following components:      Result Value   Color, UA orange (*)    Clarity, UA cloudy (*)    Glucose, UA =100 (*)    Blood, UA large (*)    Protein Ur, POC =30 (*)    Nitrite, UA Positive (*)    Leukocytes, UA Large (3+) (*)    All other components within  normal limits  URINE CULTURE    EKG   Radiology No results found.  Procedures Procedures (including critical care time)  Medications Ordered in UC Medications - No data to display  Initial Impression / Assessment and Plan / UC Course  I have reviewed the triage vital signs and the nursing notes.  Pertinent labs & imaging results that were available during my care of the patient were reviewed by me and considered in my medical decision making (see  chart for details).     UA consistent with UTI.  Will treat with Keflex twice daily x1 week, Pyridium for dysuria.  Culture pending, will call with results and alter treatment as needed.  Discussed strict return precautions. Patient verbalized understanding and is agreeable with plan.  Final Clinical Impressions(s) / UC Diagnoses   Final diagnoses:  Lower urinary tract infection, acute     Discharge Instructions     Urine showed evidence of infection. We are treating you with keflex- twice daily for 1 week. Be sure to take full course. Stay hydrated- urine should be pale yellow to clear.   Pyridium as needed for pain/burning  Please return or follow up with your primary provider if symptoms not improving with treatment. Please return sooner if you have worsening of symptoms or develop fever, nausea, vomiting, abdominal pain, back pain, lightheadedness, dizziness.   ED Prescriptions    Medication Sig Dispense Auth. Provider   cephALEXin (KEFLEX) 500 MG capsule Take 1 capsule (500 mg total) by mouth 2 (two) times daily for 7 days. 14 capsule Zunaira Lamy C, PA-C   phenazopyridine (PYRIDIUM) 200 MG tablet Take 1 tablet (200 mg total) by mouth 3 (three) times daily. 6 tablet Jullian Clayson, [place] C, PA-C     PDMP not reviewed this encounter.   Lew Dawes, PA-C 12/22/19 1624

## 2019-12-22 NOTE — ED Triage Notes (Signed)
Pt presents to [facility] for assessment of painful urination since yesterday.

## 2019-12-24 LAB — URINE CULTURE: Culture: >=100000 — AB

## 2019-12-27 ENCOUNTER — Ambulatory Visit: Payer: No Typology Code available for payment source | Admitting: Family Medicine

## 2019-12-29 ENCOUNTER — Ambulatory Visit: Payer: No Typology Code available for payment source

## 2020-01-09 ENCOUNTER — Other Ambulatory Visit: Payer: Self-pay | Admitting: Family Medicine

## 2020-01-09 MED FILL — DULoxetine HCL 60 MG CPEP: 60 | 30 days supply | Qty: 30 | Fill #5

## 2020-01-09 MED FILL — ?PANTOPRAZOLE SO DR 40MG TA: 40 | 30 days supply | Qty: 30 | Fill #1

## 2020-01-10 MED FILL — BENZONATATE 100 MG CAPS: 100 | 10 days supply | Qty: 20 | Fill #0

## 2020-01-18 ENCOUNTER — Telehealth: Payer: Self-pay

## 2020-01-18 ENCOUNTER — Encounter: Payer: Self-pay | Admitting: Physician Assistant

## 2020-01-18 ENCOUNTER — Ambulatory Visit
Admission: EM | Admit: 2020-01-18 | Discharge: 2020-01-18 | Disposition: A | Discharge status: Home / Self Care | Payer: Self-pay | Attending: Physician Assistant | Admitting: Physician Assistant

## 2020-01-18 DIAGNOSIS — H9201 Otalgia, right ear: Secondary | ICD-10-CM

## 2020-01-18 MED ORDER — IBUPROFEN 600 MG PO TABS: 600.0000 mg | ORAL_TABLET | Freq: Four times a day (QID) | ORAL | 0 refills | Status: DC | PRN

## 2020-01-18 MED ORDER — AZELASTINE HCL 0.1 % NA SOLN: 2.0000 | Freq: Two times a day (BID) | NASAL | 0 refills | Status: DC

## 2020-01-18 MED ORDER — FLUTICASONE PROPIONATE 50 MCG/ACT NA SUSP: 2.0000 | Freq: Every day | NASAL | 0 refills | Status: DC

## 2020-01-18 NOTE — ED Triage Notes (Signed)
Pt states 3 day history of right ear pain. no drainage. no fever, URI symptoms. sweet oil without relief

## 2020-01-18 NOTE — Discharge Instructions (Signed)
No signs of ear infection. Start flonase and azelastine as directed. Ibuprofen 600-800mg  three times a day. Follow up with ENT/PCP if symptoms not improving.

## 2020-01-18 NOTE — ED Provider Notes (Signed)
[name]    CSN: [id number] Arrival date & time: 01/18/20  1513      History   Chief Complaint Chief Complaint  Patient presents with   Otalgia    HPI Tammy Schwartz is a 64 y.o. female.   64 year old female comes in for 3 day history of right ear pain. Denies muffled hearing, ear drainage. Denies URI symptoms. Denies fever. No cotton swab use. Sweat oil without relief.      Past Medical History:  Diagnosis Date   Chronic back pain    HNp,spondylosis,radiculopathy   COPD (chronic obstructive pulmonary disease) (HCC)    Hyperlipidemia    was on choloesterol meds last yr-samples given in office but not needed since   Hypertension    takes Exforge daily   Joint pain    Shortness of breath    takes Singulair daily;with exertion   Sickle cell trait (HCC)    Spondylolysis    Tobacco use     Patient Active Problem List   Diagnosis Date Noted   Screening breast examination 07/26/2019   Cellulitis 05/17/2018   Lumbar radiculopathy 02/27/2017   Poor dentition 02/11/2017   Acute bronchitis    Chest pain 05/07/2016   Chest pain at rest 05/07/2016   Essential hypertension    Hyperlipidemia    Tobacco use    Spondylolysis    Chronic obstructive pulmonary disease (HCC)    Lumbar spondylosis 01/20/2014   HNP (herniated nucleus pulposus), lumbar 06/04/2012    Past Surgical History:  Procedure Laterality Date   BACK SURGERY     BREAST EXCISIONAL BIOPSY Left 2007   benign   BREAST SURGERY Left    lumpectomy   COLONOSCOPY     DILATION AND CURETTAGE OF UTERUS     LUMBAR LAMINECTOMY/DECOMPRESSION MICRODISCECTOMY  06/04/2012   Procedure: LUMBAR LAMINECTOMY/DECOMPRESSION MICRODISCECTOMY 1 LEVEL;  Surgeon: Carmela Hurt, MD;  Location: [facility];  Service: Neurosurgery;  Laterality: Left;  LEFT Lumbar five-sacral one diskectomy   LUMBAR LAMINECTOMY/DECOMPRESSION MICRODISCECTOMY Right 06/16/2013   Procedure: RIGHT  Lumbar Five-Sacral One Microdiskectomy;  Surgeon: Carmela Hurt, MD;  Location: [facility];  Service: Neurosurgery;  Laterality: Right;  RIGHT Lumbar Five-Sacral One Microdiskectomy   LUMBAR LAMINECTOMY/DECOMPRESSION MICRODISCECTOMY Right 01/20/2014   Procedure: LUMBAR FOUR TO LUMBAR FIVE LUMBAR LAMINECTOMY/DECOMPRESSION MICRODISCECTOMY 1 LEVEL;  Surgeon: Carmela Hurt, MD;  Location: [facility];  Service: Neurosurgery;  Laterality: Right;  Right L45 laminectomy and foramintomy    OB History    Gravida  2   Para      Term      Preterm      AB      Living  2     SAB      TAB      Ectopic      Multiple      Live Births  2            Home Medications    Prior to Admission medications   Medication Sig Start Date End Date Taking? Authorizing Provider  amLODipine (NORVASC) 10 MG tablet Take 1 tablet (10 mg total) by mouth daily. 11/29/19   Hoy Register, MD  aspirin EC 81 MG tablet Take 162 mg by mouth daily.    [provider]  azelastine (ASTELIN) 0.1 % nasal spray Place 2 sprays into both nostrils 2 (two) times daily. 01/18/20   Cathie Hoops, Bogdan Vivona V, PA-C  benzonatate (TESSALON) 100 MG capsule TAKE  1 CAPSULE BY MOUTH 2 TIMES DAILY AS NEEDED FOR COUGH. 01/10/20   Hoy Register, MD  carvedilol (COREG) 3.125 MG tablet Take 1 tablet (3.125 mg total) by mouth 2 (two) times daily with a meal. 11/29/19   Hoy Register, MD  cetirizine (ZYRTEC) 10 MG tablet TAKE 1 TABLET BY MOUTH DAILY. 06/08/19   Hoy Register, MD  chlorhexidine (HIBICLENS) 4 % external liquid Apply topically daily as needed. Dilute 10-15 mL in water, Use daily when bathing for 1-2 weeks 12/29/18   Domenick Gong, MD  DULoxetine (CYMBALTA) 60 MG capsule Take 1 capsule (60 mg total) by mouth daily. 11/29/19   Hoy Register, MD  fluticasone (FLONASE) 50 MCG/ACT nasal spray Place 2 sprays into both nostrils daily. 01/18/20   Cathie Hoops, Sharonda Llamas V, PA-C  fluticasone furoate-vilanterol (BREO ELLIPTA) 100-25 MCG/INH AEPB  Inhale 1 puff into the lungs daily. 07/05/19   Hoy Register, MD  gabapentin (NEURONTIN) 300 MG capsule Take 2 capsules (600 mg total) by mouth 3 (three) times daily. 11/29/19   Hoy Register, MD  hydrOXYzine (ATARAX/VISTARIL) 25 MG tablet Take 1 tablet (25 mg total) by mouth every 8 (eight) hours as needed. 11/29/19   Hoy Register, MD  ibuprofen (ADVIL) 600 MG tablet Take 1 tablet (600 mg total) by mouth every 6 (six) hours as needed. 01/18/20   Cathie Hoops, Kaisei Gilbo V, PA-C  lactulose (CHRONULAC) 10 GM/15ML solution Take 15 mLs (10 g total) by mouth 2 (two) times daily as needed for mild constipation. 06/23/17   Hoy Register, MD  meclizine (ANTIVERT) 25 MG tablet Take 1 tablet (25 mg total) by mouth 3 (three) times daily as needed for dizziness. 11/29/19   Hoy Register, MD  mirtazapine (REMERON) 30 MG tablet Take 1 tablet (30 mg total) by mouth at bedtime. 07/05/19   Hoy Register, MD  montelukast (SINGULAIR) 10 MG tablet Take 10 mg by mouth daily.     [provider]  Multiple Vitamin (MULTIVITAMIN WITH MINERALS) TABS Take 1 tablet by mouth daily.    [provider]  pantoprazole (PROTONIX) 40 MG tablet Take 1 tablet (40 mg total) by mouth daily. 11/29/19 12/29/19  Hoy Register, MD  phenazopyridine (PYRIDIUM) 200 MG tablet Take 1 tablet (200 mg total) by mouth 3 (three) times daily. 12/22/19   Wieters, Hallie C, PA-C  polyethylene glycol powder (GLYCOLAX/MIRALAX) powder Take 17 g by mouth daily. 06/09/17   Hoy Register, MD  tiZANidine (ZANAFLEX) 4 MG tablet Take 1 tablet (4 mg total) by mouth every 8 (eight) hours as needed for muscle spasms. 11/29/19   Hoy Register, MD  traMADol (ULTRAM) 50 MG tablet Take 1 tablet (50 mg total) by mouth at bedtime. Patient not taking: Reported on 11/29/2019 10/01/18   Hoy Register, MD  triamcinolone cream (KENALOG) 0.1 % Apply 1 application topically 2 (two) times daily. 11/29/19   Hoy Register, MD  VENTOLIN HFA 108 (90 Base) MCG/ACT inhaler  INHALE 2 PUFFS INTO THE LUNGS EVERY 6 HOURS AS NEEDED 11/11/19   Hoy Register, MD    Family History Family History  Problem Relation Age of Onset   Cancer - Other Mother    CAD Father        Died age 104   Hypertension Sister     Social History Social History   Tobacco Use   Smoking status: Current Every Day Smoker    Packs/day: 0.25    Years: 20.00    Pack years: 5.00    Types: Cigarettes   Smokeless tobacco:  Never Used  Substance Use Topics   Alcohol use: No   Drug use: No     Allergies   Patient has no known allergies.   Review of Systems Review of Systems  Reason unable to perform ROS: See HPI as above.     Physical Exam Triage Vital Signs ED Triage Vitals [01/18/20 1622]  Enc Vitals Group     BP (!) 155/85     Pulse Rate 84     Resp 16     Temp 98.2 F (36.8 C)     Temp Source Oral     SpO2 93 %     Weight      Height      Head Circumference      Peak Flow      Pain Score      Pain Loc      Pain Edu?      Excl. in GC?    No data found.  Updated Vital Signs BP (!) 155/85 (BP Location: Left Arm)    Pulse 84    Temp 98.2 F (36.8 C) (Oral)    Resp 16    LMP 03/17/2012    SpO2 93%   Visual Acuity Right Eye Distance:   Left Eye Distance:   Bilateral Distance:    Right Eye Near:   Left Eye Near:    Bilateral Near:     Physical Exam Constitutional:      General: She is not in acute distress.    Appearance: Normal appearance. She is well-developed. She is not toxic-appearing or diaphoretic.  HENT:     Head: Normocephalic and atraumatic.     Right Ear: Tympanic membrane, ear canal and external ear normal. No mastoid tenderness. Tympanic membrane is not erythematous or bulging.     Left Ear: Tympanic membrane, ear canal and external ear normal. No mastoid tenderness. Tympanic membrane is not erythematous or bulging.     Nose: No congestion or rhinorrhea.     Mouth/Throat:     Mouth: Mucous membranes are moist.     Pharynx:  Oropharynx is clear. Uvula midline.     Comments: No tenderness to palpation along gumline. Eyes:     Conjunctiva/sclera: Conjunctivae normal.     Pupils: Pupils are equal, round, and reactive to light.  Pulmonary:     Effort: Pulmonary effort is normal. No respiratory distress.     Comments: Speaking in full sentences without difficulty Musculoskeletal:     Cervical back: Normal range of motion and neck supple. No pain with movement, spinous process tenderness or muscular tenderness.  Skin:    General: Skin is warm and dry.  Neurological:     Mental Status: She is alert and oriented to person, place, and time.      UC Treatments / Results  Labs (all labs ordered are listed, but only abnormal results are displayed) Labs Reviewed - No data to display  EKG   Radiology No results found.  Procedures Procedures (including critical care time)  Medications Ordered in UC Medications - No data to display  Initial Impression / Assessment and Plan / UC Course  I have reviewed the triage vital signs and the nursing notes.  Pertinent labs & imaging results that were available during my care of the patient were reviewed by me and considered in my medical decision making (see chart for details).    Normal exam. Will treat possible eustachian tube dysfunction with flonase/azelastine. Return precautions given.  Final  Clinical Impressions(s) / UC Diagnoses   Final diagnoses:  Right ear pain    ED Prescriptions    Medication Sig Dispense Auth. Provider   azelastine (ASTELIN) 0.1 % nasal spray Place 2 sprays into both nostrils 2 (two) times daily. 30 mL Lukas Pelcher V, PA-C   fluticasone (FLONASE) 50 MCG/ACT nasal spray Place 2 sprays into both nostrils daily. 1 g Fanta Wimberley V, PA-C   ibuprofen (ADVIL) 600 MG tablet Take 1 tablet (600 mg total) by mouth every 6 (six) hours as needed. 30 tablet Ok Edwards, PA-C     PDMP not reviewed this encounter.   Ok Edwards, PA-C 01/18/20 2147

## 2020-01-19 MED FILL — AZELASTINE HCL 137 MCG SPRY: 0.1 | 30 days supply | Qty: 30 | Fill #0

## 2020-01-19 MED FILL — FLUTICASONE PROP 50 MCG SPR: 50 | 30 days supply | Qty: 16 | Fill #0

## 2020-01-19 MED FILL — IBUPROFEN 600 MG TABLET: 600 | 7 days supply | Qty: 30 | Fill #0

## 2020-01-23 ENCOUNTER — Ambulatory Visit: Payer: No Typology Code available for payment source | Attending: Internal Medicine

## 2020-01-23 DIAGNOSIS — Z23 Encounter for immunization: Secondary | ICD-10-CM

## 2020-01-23 NOTE — Progress Notes (Signed)
   Covid-19 Vaccination Clinic  Name:  Tammy Schwartz    MRN: [id number] DOB: 08-30-1956  01/23/2020  Tammy Schwartz was observed post Covid-19 immunization for 15 minutes without incident. She was provided with Vaccine Information Sheet and instruction to access the V-Safe system.   Tammy Schwartz was instructed to call 911 with any severe reactions post vaccine: Marland Kitchen Difficulty breathing  . Swelling of face and throat  . A fast heartbeat  . A bad rash all over body  . Dizziness and weakness   Immunizations Administered    Name Date Dose VIS Date Route   Pfizer COVID-19 Vaccine 01/23/2020 12:37 PM 0.3 mL 09/23/2019 Intramuscular   Manufacturer: [employer], [employer]   Lot: [id number]   NDC: [address]-8

## 2020-02-13 ENCOUNTER — Ambulatory Visit: Payer: No Typology Code available for payment source

## 2020-02-13 ENCOUNTER — Ambulatory Visit: Payer: No Typology Code available for payment source | Attending: Internal Medicine

## 2020-02-13 ENCOUNTER — Ambulatory Visit
Admission: EM | Admit: 2020-02-13 | Discharge: 2020-02-13 | Disposition: A | Discharge status: Home / Self Care | Payer: No Typology Code available for payment source | Attending: Emergency Medicine | Admitting: Emergency Medicine

## 2020-02-13 DIAGNOSIS — Z23 Encounter for immunization: Secondary | ICD-10-CM

## 2020-02-13 DIAGNOSIS — H9201 Otalgia, right ear: Secondary | ICD-10-CM

## 2020-02-13 MED ORDER — NEOMYCIN-POLYMYXIN-HC 3.5-10000-1 OT SOLN: 3.0000 [drp] | Freq: Four times a day (QID) | OTIC | 0 refills | Status: DC

## 2020-02-13 MED ORDER — IBUPROFEN 600 MG PO TABS: 600.0000 mg | ORAL_TABLET | Freq: Four times a day (QID) | ORAL | 0 refills | Status: DC | PRN

## 2020-02-13 MED FILL — NEO/POLYMYXIN/HC EAR SUSP: 3.5-10000-1 | 12 days supply | Qty: 10 | Fill #0

## 2020-02-13 MED FILL — ?CETIRIZINE HCL 10 MG TABLE: 10 | 30 days supply | Qty: 30 | Fill #2

## 2020-02-13 MED FILL — IBUPROFEN 600 MG TABLET: 600 | 7 days supply | Qty: 30 | Fill #0

## 2020-02-13 NOTE — ED Triage Notes (Signed)
Pt states seen here on 04/07 and tx's for rt ear pain. States pain is worse and throbbing every time she eats.

## 2020-02-13 NOTE — Discharge Instructions (Signed)
Use eardrops as prescribed for the next week. Return for worsening ear pain, swelling, discharge, bleeding, decreased hearing, development of jaw pain/swelling, fever.  Do NOT use Q-tips as these can cause your ear wax to get stuck, the tips may break off and become a foreign body requiring additional medical care, or puncture your eardrum.  Helpful prevention tip: Use a solution of equal parts isopropyl (rubbing) alcohol and white vinegar (acetic acid) in both ears after swimming. 

## 2020-02-13 NOTE — Progress Notes (Signed)
   Covid-19 Vaccination Clinic  Name:  Tammy Schwartz    MRN: [id number] DOB: 1956-01-28  02/13/2020  [name] was observed post Covid-19 immunization for 15 minutes without incident. She was provided with Vaccine Information Sheet and instruction to access the V-Safe system.   [name] was instructed to call 911 with any severe reactions post vaccine: Marland Kitchen Difficulty breathing  . Swelling of face and throat  . A fast heartbeat  . A bad rash all over body  . Dizziness and weakness   Immunizations Administered    Name Date Dose VIS Date Route   Pfizer COVID-19 Vaccine 02/13/2020  1:08 PM 0.3 mL 12/07/2018 Intramuscular   Manufacturer: [employer], [employer]   Lot: [id number]   NDC: [address]-5

## 2020-02-13 NOTE — ED Provider Notes (Signed)
[name]    CSN: [id number] Arrival date & time: 02/13/20  1511      History   Chief Complaint Chief Complaint  Patient presents with  . Otalgia    HPI Tammy Schwartz is a 64 y.o. female with history of hypertension presenting for persistent right ear pain.  Patient initially seen for this on 01/18/2020.  Treated supportively for suspected eustachian tube dysfunction with nasal sprays.  Since then, patient states ear pain is progressively gotten worse.  Denying bleeding, discharge, dental pain, nasal congestion.  No change in hearing, tinnitus, dizziness.   Past Medical History:  Diagnosis Date  . Chronic back pain    HNp,spondylosis,radiculopathy  . COPD (chronic obstructive pulmonary disease) (HCC)   . Hyperlipidemia    was on choloesterol meds last yr-samples given in office but not needed since  . Hypertension    takes Exforge daily  . Joint pain   . Shortness of breath    takes Singulair daily;with exertion  . Sickle cell trait (HCC)   . Spondylolysis   . Tobacco use     Patient Active Problem List   Diagnosis Date Noted  . Screening breast examination 07/26/2019  . Cellulitis 05/17/2018  . Lumbar radiculopathy 02/27/2017  . Poor dentition 02/11/2017  . Acute bronchitis   . Chest pain 05/07/2016  . Chest pain at rest 05/07/2016  . Essential hypertension   . Hyperlipidemia   . Tobacco use   . Spondylolysis   . Chronic obstructive pulmonary disease (HCC)   . Lumbar spondylosis 01/20/2014  . HNP (herniated nucleus pulposus), lumbar 06/04/2012    Past Surgical History:  Procedure Laterality Date  . BACK SURGERY    . BREAST EXCISIONAL BIOPSY Left 2007   benign  . BREAST SURGERY Left    lumpectomy  . COLONOSCOPY    . DILATION AND CURETTAGE OF UTERUS    . LUMBAR LAMINECTOMY/DECOMPRESSION MICRODISCECTOMY  06/04/2012   Procedure: LUMBAR LAMINECTOMY/DECOMPRESSION MICRODISCECTOMY 1 LEVEL;  Surgeon: Carmela Hurt, MD;  Location: [facility];  Service: Neurosurgery;  Laterality: Left;  LEFT Lumbar five-sacral one diskectomy  . LUMBAR LAMINECTOMY/DECOMPRESSION MICRODISCECTOMY Right 06/16/2013   Procedure: RIGHT Lumbar Five-Sacral One Microdiskectomy;  Surgeon: Carmela Hurt, MD;  Location: [facility];  Service: Neurosurgery;  Laterality: Right;  RIGHT Lumbar Five-Sacral One Microdiskectomy  . LUMBAR LAMINECTOMY/DECOMPRESSION MICRODISCECTOMY Right 01/20/2014   Procedure: LUMBAR FOUR TO LUMBAR FIVE LUMBAR LAMINECTOMY/DECOMPRESSION MICRODISCECTOMY 1 LEVEL;  Surgeon: Carmela Hurt, MD;  Location: [facility];  Service: Neurosurgery;  Laterality: Right;  Right L45 laminectomy and foramintomy    OB History    Gravida  2   Para      Term      Preterm      AB      Living  2     SAB      TAB      Ectopic      Multiple      Live Births  2            Home Medications    Prior to Admission medications   Medication Sig Start Date End Date Taking? Authorizing Provider  amLODipine (NORVASC) 10 MG tablet Take 1 tablet (10 mg total) by mouth daily. 11/29/19   Hoy Register, MD  aspirin EC 81 MG tablet Take 162 mg by mouth daily.    [provider]  azelastine (ASTELIN) 0.1 % nasal spray Place 2 sprays into both nostrils  2 (two) times daily. 01/18/20   Cathie Hoops, Amy V, PA-C  benzonatate (TESSALON) 100 MG capsule TAKE 1 CAPSULE BY MOUTH 2 TIMES DAILY AS NEEDED FOR COUGH. 01/10/20   Hoy Register, MD  carvedilol (COREG) 3.125 MG tablet Take 1 tablet (3.125 mg total) by mouth 2 (two) times daily with a meal. 11/29/19   Hoy Register, MD  cetirizine (ZYRTEC) 10 MG tablet TAKE 1 TABLET BY MOUTH DAILY. 06/08/19   Hoy Register, MD  chlorhexidine (HIBICLENS) 4 % external liquid Apply topically daily as needed. Dilute 10-15 mL in water, Use daily when bathing for 1-2 weeks 12/29/18   Domenick Gong, MD  DULoxetine (CYMBALTA) 60 MG capsule Take 1 capsule (60 mg total) by mouth daily. 11/29/19   Hoy Register, MD    fluticasone (FLONASE) 50 MCG/ACT nasal spray Place 2 sprays into both nostrils daily. 01/18/20   Cathie Hoops, Amy V, PA-C  fluticasone furoate-vilanterol (BREO ELLIPTA) 100-25 MCG/INH AEPB Inhale 1 puff into the lungs daily. 07/05/19   Hoy Register, MD  gabapentin (NEURONTIN) 300 MG capsule Take 2 capsules (600 mg total) by mouth 3 (three) times daily. 11/29/19   Hoy Register, MD  hydrOXYzine (ATARAX/VISTARIL) 25 MG tablet Take 1 tablet (25 mg total) by mouth every 8 (eight) hours as needed. 11/29/19   Hoy Register, MD  ibuprofen (ADVIL) 600 MG tablet Take 1 tablet (600 mg total) by mouth every 6 (six) hours as needed. 02/13/20   Hall-Potvin, [place], PA-C  lactulose (CHRONULAC) 10 GM/15ML solution Take 15 mLs (10 g total) by mouth 2 (two) times daily as needed for mild constipation. 06/23/17   Hoy Register, MD  meclizine (ANTIVERT) 25 MG tablet Take 1 tablet (25 mg total) by mouth 3 (three) times daily as needed for dizziness. 11/29/19   Hoy Register, MD  mirtazapine (REMERON) 30 MG tablet Take 1 tablet (30 mg total) by mouth at bedtime. 07/05/19   Hoy Register, MD  montelukast (SINGULAIR) 10 MG tablet Take 10 mg by mouth daily.     [provider]  Multiple Vitamin (MULTIVITAMIN WITH MINERALS) TABS Take 1 tablet by mouth daily.    [provider]  neomycin-polymyxin-hydrocortisone (CORTISPORIN) OTIC solution Place 3 drops into the right ear 4 (four) times daily. 02/13/20   Hall-Potvin, [place], PA-C  pantoprazole (PROTONIX) 40 MG tablet Take 1 tablet (40 mg total) by mouth daily. 11/29/19 12/29/19  Hoy Register, MD  phenazopyridine (PYRIDIUM) 200 MG tablet Take 1 tablet (200 mg total) by mouth 3 (three) times daily. 12/22/19   Wieters, Hallie C, PA-C  polyethylene glycol powder (GLYCOLAX/MIRALAX) powder Take 17 g by mouth daily. 06/09/17   Hoy Register, MD  tiZANidine (ZANAFLEX) 4 MG tablet Take 1 tablet (4 mg total) by mouth every 8 (eight) hours as needed for muscle spasms.  11/29/19   Hoy Register, MD  triamcinolone cream (KENALOG) 0.1 % Apply 1 application topically 2 (two) times daily. 11/29/19   Hoy Register, MD  VENTOLIN HFA 108 (90 Base) MCG/ACT inhaler INHALE 2 PUFFS INTO THE LUNGS EVERY 6 HOURS AS NEEDED 11/11/19   Hoy Register, MD    Family History Family History  Problem Relation Age of Onset  . Cancer - Other Mother   . CAD Father        Died age 45  . Hypertension Sister     Social History Social History   Tobacco Use  . Smoking status: Current Every Day Smoker    Packs/day: 0.25    Years: 20.00  Pack years: 5.00    Types: Cigarettes  . Smokeless tobacco: Never Used  Substance Use Topics  . Alcohol use: No  . Drug use: No     Allergies   Patient has no known allergies.   Review of Systems As per HPI   Physical Exam Triage Vital Signs ED Triage Vitals  Enc Vitals Group     BP      Pulse      Resp      Temp      Temp src      SpO2      Weight      Height      Head Circumference      Peak Flow      Pain Score      Pain Loc      Pain Edu?      Excl. in GC?    No data found.  Updated Vital Signs BP (!) 156/73 (BP Location: Left Arm)   Pulse 92   Temp 99 F (37.2 C) (Oral)   Resp 18   LMP 03/17/2012   SpO2 99%   Visual Acuity Right Eye Distance:   Left Eye Distance:   Bilateral Distance:    Right Eye Near:   Left Eye Near:    Bilateral Near:     Physical Exam Constitutional:      General: She is not in acute distress. HENT:     Head: Normocephalic and atraumatic.     Right Ear: Tympanic membrane and external ear normal.     Left Ear: Tympanic membrane, ear canal and external ear normal.     Ears:     Comments: Negative tragal tenderness bilaterally.  Right EAC with mild edema, injection as compared to left.    Nose: No nasal deformity, congestion or rhinorrhea.     Comments: Turbinates nonedematous bilaterally with pink mucosa    Mouth/Throat:     Mouth: Mucous membranes are moist.      Tongue: Tongue does not deviate from midline.     Pharynx: Oropharynx is clear. Uvula midline.     Comments: No tonsillar hypertrophy or exudate Eyes:     General: No scleral icterus.    Conjunctiva/sclera: Conjunctivae normal.     Pupils: Pupils are equal, round, and reactive to light.  Cardiovascular:     Rate and Rhythm: Normal rate.  Pulmonary:     Effort: Pulmonary effort is normal. No respiratory distress.     Breath sounds: No wheezing.  Musculoskeletal:     Cervical back: Normal range of motion and neck supple. No muscular tenderness.  Lymphadenopathy:     Cervical: No cervical adenopathy.  Neurological:     Mental Status: She is alert.      UC Treatments / Results  Labs (all labs ordered are listed, but only abnormal results are displayed) Labs Reviewed - No data to display  EKG   Radiology No results found.  Procedures Procedures (including critical care time)  Medications Ordered in UC Medications - No data to display  Initial Impression / Assessment and Plan / UC Course  I have reviewed the triage vital signs and the nursing notes.  Pertinent labs & imaging results that were available during my care of the patient were reviewed by me and considered in my medical decision making (see chart for details).     Patient febrile, nontoxic in office today.  Patient has persistent ear pain despite intranasal therapy.  Patient does  have mild EAC swelling and injection: We will cover with Cortisporin, follow-up with ENT for persistent/worsening symptoms.  Return precautions discussed, patient verbalized understanding and is agreeable to plan. Final Clinical Impressions(s) / UC Diagnoses   Final diagnoses:  Otalgia of right ear     Discharge Instructions     Use eardrops as prescribed for the next week. Return for worsening ear pain, swelling, discharge, bleeding, decreased hearing, development of jaw pain/swelling, fever.  Do NOT use Q-tips as these can  cause your ear wax to get stuck, the tips may break off and become a foreign body requiring additional medical care, or puncture your eardrum.  Helpful prevention tip: Use a solution of equal parts isopropyl (rubbing) alcohol and white vinegar (acetic acid) in both ears after swimming.    ED Prescriptions    Medication Sig Dispense Auth. Provider   neomycin-polymyxin-hydrocortisone (CORTISPORIN) OTIC solution Place 3 drops into the right ear 4 (four) times daily. 10 mL Hall-Potvin, [place], PA-C   ibuprofen (ADVIL) 600 MG tablet Take 1 tablet (600 mg total) by mouth every 6 (six) hours as needed. 30 tablet Hall-Potvin, [place], PA-C     PDMP not reviewed this encounter.   Hall-Potvin, [place], [place] 02/13/20 1708

## 2020-02-14 MED FILL — DULoxetine HCL 60 MG CPEP: 60 | 30 days supply | Qty: 30 | Fill #0

## 2020-02-23 ENCOUNTER — Encounter (HOSPITAL_COMMUNITY): Payer: Self-pay | Admitting: Emergency Medicine

## 2020-02-23 ENCOUNTER — Other Ambulatory Visit: Payer: Self-pay

## 2020-02-23 ENCOUNTER — Emergency Department (HOSPITAL_COMMUNITY): Payer: No Typology Code available for payment source

## 2020-02-23 ENCOUNTER — Emergency Department (HOSPITAL_COMMUNITY)
Admission: EM | Admit: 2020-02-23 | Discharge: 2020-02-23 | Disposition: A | Discharge status: Home / Self Care | Payer: No Typology Code available for payment source | Attending: Emergency Medicine | Admitting: Emergency Medicine

## 2020-02-23 DIAGNOSIS — J449 Chronic obstructive pulmonary disease, unspecified: Secondary | ICD-10-CM | POA: Insufficient documentation

## 2020-02-23 DIAGNOSIS — I10 Essential (primary) hypertension: Secondary | ICD-10-CM | POA: Insufficient documentation

## 2020-02-23 DIAGNOSIS — Z79899 Other long term (current) drug therapy: Secondary | ICD-10-CM | POA: Insufficient documentation

## 2020-02-23 DIAGNOSIS — H9201 Otalgia, right ear: Secondary | ICD-10-CM

## 2020-02-23 DIAGNOSIS — F17210 Nicotine dependence, cigarettes, uncomplicated: Secondary | ICD-10-CM | POA: Insufficient documentation

## 2020-02-23 LAB — CBC
HCT: 41.7 % (ref 36.0–46.0)
Hemoglobin: 14 g/dL (ref 12.0–15.0)
MCH: 29 pg (ref 26.0–34.0)
MCHC: 33.6 g/dL (ref 30.0–36.0)
MCV: 86.5 fL (ref 80.0–100.0)
Platelets: 278 10*3/uL (ref 150–400)
RBC: 4.82 MIL/uL (ref 3.87–5.11)
RDW: 15.6 % — ABNORMAL HIGH (ref 11.5–15.5)
WBC: 6.1 10*3/uL (ref 4.0–10.5)
nRBC: 0 % (ref 0.0–0.2)

## 2020-02-23 LAB — I-STAT CHEM 8, ED
BUN: 5 mg/dL — ABNORMAL LOW (ref 8–23)
Calcium, Ion: 1.28 mmol/L (ref 1.15–1.40)
Chloride: 106 mmol/L (ref 98–111)
Creatinine, Ser: 0.7 mg/dL (ref 0.44–1.00)
Glucose, Bld: 85 mg/dL (ref 70–99)
HCT: 43 % (ref 36.0–46.0)
Hemoglobin: 14.6 g/dL (ref 12.0–15.0)
Potassium: 3.2 mmol/L — ABNORMAL LOW (ref 3.5–5.1)
Sodium: 143 mmol/L (ref 135–145)
TCO2: 26 mmol/L (ref 22–32)

## 2020-02-23 MED ORDER — NAPROXEN 500 MG PO TABS: 500.0000 mg | ORAL_TABLET | Freq: Two times a day (BID) | ORAL | 0 refills | Status: DC

## 2020-02-23 MED ORDER — HYDROCODONE-ACETAMINOPHEN 5-325 MG PO TABS: 1.0000 | ORAL_TABLET | Freq: Four times a day (QID) | ORAL | 0 refills | Status: DC | PRN

## 2020-02-23 MED ORDER — IOHEXOL 300 MG/ML  SOLN
75.0000 mL | Freq: Once | INTRAMUSCULAR | Status: AC | PRN
  Administered 2020-02-23: 75 mL via INTRAVENOUS

## 2020-02-23 NOTE — ED Notes (Signed)
Provider at bedside

## 2020-02-23 NOTE — Discharge Instructions (Signed)
You have been evaluated for your right ear pain.  CT scan today did not show any concerning finding.  You may take naproxen twice daily and when the pain is intense you may take Vicodin for pain.  Call and follow-up closely with ENT specialist for further management.

## 2020-02-23 NOTE — ED Notes (Signed)
Pt transported to CT ?

## 2020-02-23 NOTE — ED Triage Notes (Signed)
Pt c/o right ear pain for about a month. Reports been seen at [facility] for it twice but medications arent helping.

## 2020-02-23 NOTE — ED Provider Notes (Signed)
[facility]-EMERGENCY DEPT Provider Note   CSN: [id number] Arrival date & time: 02/23/20  1717     History Chief Complaint  Patient presents with  . Otalgia    Isys Tietje is a 64 y.o. female.  The history is provided by the patient and medical records. No language interpreter was used.  Otalgia Associated symptoms: no fever and no headaches      64 year old female with history of chronic back pain, COPD, hyperlipidemia, hypertension presenting for evaluation of right ear pain.  Patient report for the past 1 month she has had persistent pain to about her right ear.  Pain is described as a throbbing sensation, present at rest and with movement, sometimes keeping her up at night.  Pain is moderate in severity nothing seems to make it better or worse.  No associated fever, congestion, loss of hearing, tinnitus, jaw pain, neck pain, chest pain or trouble breathing.  No drainage coming from the ear but does endorse some discomfort when she lays on the affected side.  She mention she has been seen at the urgent care center twice for this complaint.  She was given several different types of eardrops which provided no relief.  She has not had a chance to follow-up with her PCP yet.  Denies severe headache or vision changes.  Past Medical History:  Diagnosis Date  . Chronic back pain    HNp,spondylosis,radiculopathy  . COPD (chronic obstructive pulmonary disease) (HCC)   . Hyperlipidemia    was on choloesterol meds last yr-samples given in office but not needed since  . Hypertension    takes Exforge daily  . Joint pain   . Shortness of breath    takes Singulair daily;with exertion  . Sickle cell trait (HCC)   . Spondylolysis   . Tobacco use     Patient Active Problem List   Diagnosis Date Noted  . Screening breast examination 07/26/2019  . Cellulitis 05/17/2018  . Lumbar radiculopathy 02/27/2017  . Poor dentition 02/11/2017  . Acute bronchitis     . Chest pain 05/07/2016  . Chest pain at rest 05/07/2016  . Essential hypertension   . Hyperlipidemia   . Tobacco use   . Spondylolysis   . Chronic obstructive pulmonary disease (HCC)   . Lumbar spondylosis 01/20/2014  . HNP (herniated nucleus pulposus), lumbar 06/04/2012    Past Surgical History:  Procedure Laterality Date  . BACK SURGERY    . BREAST EXCISIONAL BIOPSY Left 2007   benign  . BREAST SURGERY Left    lumpectomy  . COLONOSCOPY    . DILATION AND CURETTAGE OF UTERUS    . LUMBAR LAMINECTOMY/DECOMPRESSION MICRODISCECTOMY  06/04/2012   Procedure: LUMBAR LAMINECTOMY/DECOMPRESSION MICRODISCECTOMY 1 LEVEL;  Surgeon: Carmela Hurt, MD;  Location: [facility];  Service: Neurosurgery;  Laterality: Left;  LEFT Lumbar five-sacral one diskectomy  . LUMBAR LAMINECTOMY/DECOMPRESSION MICRODISCECTOMY Right 06/16/2013   Procedure: RIGHT Lumbar Five-Sacral One Microdiskectomy;  Surgeon: Carmela Hurt, MD;  Location: [facility];  Service: Neurosurgery;  Laterality: Right;  RIGHT Lumbar Five-Sacral One Microdiskectomy  . LUMBAR LAMINECTOMY/DECOMPRESSION MICRODISCECTOMY Right 01/20/2014   Procedure: LUMBAR FOUR TO LUMBAR FIVE LUMBAR LAMINECTOMY/DECOMPRESSION MICRODISCECTOMY 1 LEVEL;  Surgeon: Carmela Hurt, MD;  Location: [facility];  Service: Neurosurgery;  Laterality: Right;  Right L45 laminectomy and foramintomy     OB History    Gravida  2   Para      Term      Preterm  AB      Living  2     SAB      TAB      Ectopic      Multiple      Live Births  2           Family History  Problem Relation Age of Onset  . Cancer - Other Mother   . CAD Father        Died age 55  . Hypertension Sister     Social History   Tobacco Use  . Smoking status: Current Every Day Smoker    Packs/day: 0.25    Years: 20.00    Pack years: 5.00    Types: Cigarettes  . Smokeless tobacco: Never Used  Substance Use Topics  . Alcohol use: No  . Drug use: No    Home  Medications Prior to Admission medications   Medication Sig Start Date End Date Taking? Authorizing Provider  amLODipine (NORVASC) 10 MG tablet Take 1 tablet (10 mg total) by mouth daily. 11/29/19   Charlott Rakes, MD  aspirin EC 81 MG tablet Take 162 mg by mouth daily.    [provider]  azelastine (ASTELIN) 0.1 % nasal spray Place 2 sprays into both nostrils 2 (two) times daily. 01/18/20   Tasia Catchings, Amy V, PA-C  benzonatate (TESSALON) 100 MG capsule TAKE 1 CAPSULE BY MOUTH 2 TIMES DAILY AS NEEDED FOR COUGH. 01/10/20   Charlott Rakes, MD  carvedilol (COREG) 3.125 MG tablet Take 1 tablet (3.125 mg total) by mouth 2 (two) times daily with a meal. 11/29/19   Charlott Rakes, MD  cetirizine (ZYRTEC) 10 MG tablet TAKE 1 TABLET BY MOUTH DAILY. 06/08/19   Charlott Rakes, MD  chlorhexidine (HIBICLENS) 4 % external liquid Apply topically daily as needed. Dilute 10-15 mL in water, Use daily when bathing for 1-2 weeks 12/29/18   Melynda Ripple, MD  DULoxetine (CYMBALTA) 60 MG capsule Take 1 capsule (60 mg total) by mouth daily. 11/29/19   Charlott Rakes, MD  fluticasone (FLONASE) 50 MCG/ACT nasal spray Place 2 sprays into both nostrils daily. 01/18/20   Tasia Catchings, Amy V, PA-C  fluticasone furoate-vilanterol (BREO ELLIPTA) 100-25 MCG/INH AEPB Inhale 1 puff into the lungs daily. 07/05/19   Charlott Rakes, MD  gabapentin (NEURONTIN) 300 MG capsule Take 2 capsules (600 mg total) by mouth 3 (three) times daily. 11/29/19   Charlott Rakes, MD  hydrOXYzine (ATARAX/VISTARIL) 25 MG tablet Take 1 tablet (25 mg total) by mouth every 8 (eight) hours as needed. 11/29/19   Charlott Rakes, MD  ibuprofen (ADVIL) 600 MG tablet Take 1 tablet (600 mg total) by mouth every 6 (six) hours as needed. 02/13/20   Hall-Potvin, [place], PA-C  lactulose (CHRONULAC) 10 GM/15ML solution Take 15 mLs (10 g total) by mouth 2 (two) times daily as needed for mild constipation. 06/23/17   Charlott Rakes, MD  meclizine (ANTIVERT) 25 MG tablet Take 1  tablet (25 mg total) by mouth 3 (three) times daily as needed for dizziness. 11/29/19   Charlott Rakes, MD  mirtazapine (REMERON) 30 MG tablet Take 1 tablet (30 mg total) by mouth at bedtime. 07/05/19   Charlott Rakes, MD  montelukast (SINGULAIR) 10 MG tablet Take 10 mg by mouth daily.     [provider]  Multiple Vitamin (MULTIVITAMIN WITH MINERALS) TABS Take 1 tablet by mouth daily.    [provider]  neomycin-polymyxin-hydrocortisone (CORTISPORIN) OTIC solution Place 3 drops into the right ear 4 (four) times daily. 02/13/20  Hall-Potvin, [place], PA-C  pantoprazole (PROTONIX) 40 MG tablet Take 1 tablet (40 mg total) by mouth daily. 11/29/19 12/29/19  Hoy Register, MD  phenazopyridine (PYRIDIUM) 200 MG tablet Take 1 tablet (200 mg total) by mouth 3 (three) times daily. 12/22/19   Wieters, Hallie C, PA-C  polyethylene glycol powder (GLYCOLAX/MIRALAX) powder Take 17 g by mouth daily. 06/09/17   Hoy Register, MD  tiZANidine (ZANAFLEX) 4 MG tablet Take 1 tablet (4 mg total) by mouth every 8 (eight) hours as needed for muscle spasms. 11/29/19   Hoy Register, MD  triamcinolone cream (KENALOG) 0.1 % Apply 1 application topically 2 (two) times daily. 11/29/19   Hoy Register, MD  VENTOLIN HFA 108 (90 Base) MCG/ACT inhaler INHALE 2 PUFFS INTO THE LUNGS EVERY 6 HOURS AS NEEDED 11/11/19   Hoy Register, MD    Allergies    Patient has no known allergies.  Review of Systems   Review of Systems  Constitutional: Negative for fever.  HENT: Positive for ear pain.   Neurological: Negative for headaches.    Physical Exam Updated Vital Signs BP (!) 152/85 (BP Location: Left Arm)   Pulse 99   Temp 98.7 F (37.1 C) (Oral)   Resp 18   LMP 03/17/2012   SpO2 100%   Physical Exam Vitals and nursing note reviewed.  Constitutional:      General: She is not in acute distress.    Appearance: She is well-developed.     Comments: Patient is well-appearing sitting in the chair in no  acute discomfort.  HENT:     Head: Atraumatic.     Right Ear: Tympanic membrane, ear canal and external ear normal. There is no impacted cerumen.     Left Ear: Tympanic membrane and external ear normal. There is no impacted cerumen.     Ears:     Comments: Right ear: Tenderness about the external ear and postauricular region without any overlying skin changes, no edema or erythema about the ear canal, normal TMs with out effusion or erythema.    Nose: Nose normal.     Mouth/Throat:     Mouth: Mucous membranes are moist.  Eyes:     Extraocular Movements: Extraocular movements intact.     Conjunctiva/sclera: Conjunctivae normal.     Pupils: Pupils are equal, round, and reactive to light.     Comments: Mild exophthalmos  Cardiovascular:     Rate and Rhythm: Normal rate and regular rhythm.     Pulses: Normal pulses.     Heart sounds: Normal heart sounds.  Pulmonary:     Effort: Pulmonary effort is normal.     Breath sounds: Normal breath sounds.  Musculoskeletal:     Cervical back: Neck supple. No rigidity or tenderness.  Lymphadenopathy:     Cervical: No cervical adenopathy.  Skin:    Findings: No rash.  Neurological:     Mental Status: She is alert and oriented to person, place, and time.  Psychiatric:        Mood and Affect: Mood normal.     ED Results / Procedures / Treatments   Labs (all labs ordered are listed, but only abnormal results are displayed) Labs Reviewed  CBC - Abnormal; Notable for the following components:      Result Value   RDW 15.6 (*)    All other components within normal limits  I-STAT CHEM 8, ED - Abnormal; Notable for the following components:   Potassium 3.2 (*)    BUN 5 (*)  All other components within normal limits    EKG None  Radiology CT Maxillofacial W Contrast  Result Date: 02/23/2020 CLINICAL DATA:  Initial evaluation for acute right-sided otalgia. EXAM: CT MAXILLOFACIAL WITH CONTRAST TECHNIQUE: Multidetector CT imaging of the  maxillofacial structures was performed with intravenous contrast. Multiplanar CT image reconstructions were also generated. CONTRAST:  22mL OMNIPAQUE IOHEXOL 300 MG/ML  SOLN COMPARISON:  Prior CT from 08/31/2018. FINDINGS: Osseous: No acute osseous abnormality about the face. No discrete osseous lesions. Patient is edentulous. Orbits: Globes and orbital soft tissues demonstrate no acute finding. Sinuses: Paranasal sinuses are clear. Mastoid air cells and middle ear cavities are well pneumatized and free of fluid. No imaging findings to suggest acute otomastoiditis. No pre or postauricular soft tissue swelling to suggest otitis externa. Soft tissues: No significant soft tissue swelling seen about the face. Salivary glands including the parotid and submandibular glands are within normal limits. No adenopathy within the visualized neck. Oral cavity within normal limits. Oropharynx and nasopharynx within normal limits. Parapharyngeal fat maintained. Visualized thyroid normal. Limited intracranial: Visualized intracranial contents are unremarkable. IMPRESSION: Negative maxillofacial CT. No acute abnormality or findings to explain patient's symptoms identified. Electronically Signed   By: Rise Mu M.D.   On: 02/23/2020 19:54    Procedures Procedures (including critical care time)  Medications Ordered in ED Medications  iohexol (OMNIPAQUE) 300 MG/ML solution 75 mL (75 mLs Intravenous Contrast Given 02/23/20 1924)    ED Course  I have reviewed the triage vital signs and the nursing notes.  Pertinent labs & imaging results that were available during my care of the patient were reviewed by me and considered in my medical decision making (see chart for details).    MDM Rules/Calculators/A&P                      BP (!) 152/85 (BP Location: Left Arm)   Pulse 99   Temp 98.7 F (37.1 C) (Oral)   Resp 18   LMP 03/17/2012   SpO2 100%   Final Clinical Impression(s) / ED Diagnoses Final  diagnoses:  Right ear pain    Rx / DC Orders ED Discharge Orders         Ordered    naproxen (NAPROSYN) 500 MG tablet  2 times daily     02/23/20 2019    HYDROcodone-acetaminophen (NORCO/VICODIN) 5-325 MG tablet  Every 6 hours PRN     02/23/20 2019         6:05 PM Patient here with persistent right ear pain ongoing for 1 month.  She has been seen at urgent care twice for her complaints and received several different types of eardrops including Cortisporin which provided no relief.  Examination of the ear without any obvious physical finding aside from tenderness to her external auricular region.  Will obtain CT scan of the maxillofacial to rule out mastoiditis or other potential complicated ear pathology.  Care discussed with Dr. Clarice Pole.  8:20 PM Maxillofacial CT scan without any acute finding.  At this time I recommend patient to follow-up with ENT for further care.  Will discharge home with naproxen as well as a short course of Vicodin for pain.  Trigeminal neuralgia, or temporal arteritis is also a consideration however suspicion is low.  Will refer for the evaluation to specialist.   Fayrene Helper, PA-C 02/23/20 2021    Arby Barrette, MD 02/23/20 [phone]

## 2020-02-24 ENCOUNTER — Telehealth (HOSPITAL_COMMUNITY): Payer: Self-pay | Admitting: Emergency Medicine

## 2020-02-24 ENCOUNTER — Telehealth: Payer: Self-pay | Admitting: *Deleted

## 2020-02-24 MED ORDER — HYDROCODONE-ACETAMINOPHEN 5-325 MG PO TABS: 1.0000 | ORAL_TABLET | Freq: Four times a day (QID) | ORAL | 0 refills | Status: DC | PRN

## 2020-02-24 NOTE — Telephone Encounter (Signed)
TOC CM received call from pt and states she went to [facility] to pick up meds. [facility] does not fill narcotics. Sent message to ED provider to send to [facility] on [address] (pharmacy is listed in pt's profile). Isidoro Donning RN CCM, [facility] ED TOC CM [phone]

## 2020-02-25 MED ORDER — HYDROCODONE-ACETAMINOPHEN 5-325 MG PO TABS: 2.0000 | ORAL_TABLET | ORAL | 0 refills | Status: DC | PRN

## 2020-02-25 NOTE — Telephone Encounter (Signed)
Patient called in and stated that her prescription for hydrocodone did not go through to [facility]. Spoke with B Hall PA Who looked over patients chart and was able to re- call in script, called patient back to inform her that [facility] on [address] had her script. She stated that her ear pain is worse than ever. Provided emotional support. Encouraged patent to try pain medicine, and call specialist first thing Monday, if she does feel worse, she is free to come back to be seen.

## 2020-02-25 NOTE — Telephone Encounter (Cosign Needed)
Patient seen by Ardelle Park yesterday.  Apparently had sent in Norco for patient's ear pain.  Prescription sent to [facility] pharmacy which apparently does not fill narcotics. Per Case Management.  Prescription resent to [facility] pharmacy off [facility].

## 2020-02-29 ENCOUNTER — Other Ambulatory Visit: Payer: Self-pay | Admitting: Pharmacist

## 2020-02-29 DIAGNOSIS — J449 Chronic obstructive pulmonary disease, unspecified: Secondary | ICD-10-CM

## 2020-02-29 MED ORDER — BREO ELLIPTA 100-25 MCG/INH IN AEPB: 1.0000 | INHALATION_SPRAY | Freq: Every day | RESPIRATORY_TRACT | 2 refills | Status: DC

## 2020-02-29 MED FILL — BREO ELLIPTA 100-25 MCG INH: 100-25 | 30 days supply | Qty: 60 | Fill #0

## 2020-02-29 MED FILL — $VENTOLIN HFA 18G INHALER: 108 (90 BAS | 25 days supply | Qty: 18 | Fill #2

## 2020-03-01 ENCOUNTER — Other Ambulatory Visit: Payer: Self-pay | Admitting: Family Medicine

## 2020-03-02 MED FILL — BENZONATATE 100 MG CAPS: 100 | 10 days supply | Qty: 20 | Fill #0

## 2020-03-26 MED FILL — DULoxetine HCL 60 MG CPEP: 60 | 30 days supply | Qty: 30 | Fill #1

## 2020-03-26 MED FILL — ?PANTOPRAZOLE SO DR 40MG TA: 40 | 30 days supply | Qty: 30 | Fill #2

## 2020-04-03 ENCOUNTER — Other Ambulatory Visit: Payer: Self-pay | Admitting: Family Medicine

## 2020-04-03 DIAGNOSIS — L299 Pruritus, unspecified: Secondary | ICD-10-CM

## 2020-04-04 ENCOUNTER — Other Ambulatory Visit: Payer: Self-pay | Admitting: Family Medicine

## 2020-04-04 ENCOUNTER — Telehealth: Payer: Self-pay | Admitting: Family Medicine

## 2020-04-04 DIAGNOSIS — L299 Pruritus, unspecified: Secondary | ICD-10-CM

## 2020-04-04 MED FILL — TRIAMCINOLONE ACETONIDE 0.1: 0.1 | 30 days supply | Qty: 80 | Fill #0

## 2020-04-04 NOTE — Telephone Encounter (Signed)
Patient dropped off disability parking placard to be filled out by PCP. Form will be placed in PCP box.

## 2020-04-09 NOTE — Telephone Encounter (Signed)
Paperwork has been received and will be filled out by PCP. Patient will be called once ready for pick up.

## 2020-04-13 NOTE — Telephone Encounter (Signed)
Joyce calling from the [employer] [place] [employer] is calling to see how long does Dr. Newlin want the patient to have the handicap decal for? It is not marked on the form.  The patient will bring back to the office for completion.  Please advise with the patient. 

## 2020-04-17 ENCOUNTER — Telehealth: Payer: Self-pay | Admitting: Family Medicine

## 2020-04-17 NOTE — Telephone Encounter (Signed)
Alona Bene calling from the [employer] is calling to see how long does Dr. Alvis Lemmings want the patient to have the handicap decal for? It is not marked on the form.  The patient will bring back to the office for completion.  Please advise with the patient.

## 2020-04-17 NOTE — Telephone Encounter (Signed)
Form will be filled out once patient bring form back to clinic.

## 2020-04-24 ENCOUNTER — Other Ambulatory Visit: Payer: Self-pay

## 2020-04-24 ENCOUNTER — Encounter (HOSPITAL_COMMUNITY): Payer: Self-pay | Admitting: *Deleted

## 2020-04-24 ENCOUNTER — Emergency Department (HOSPITAL_COMMUNITY)
Admission: EM | Admit: 2020-04-24 | Discharge: 2020-04-24 | Disposition: A | Discharge status: Home / Self Care | Payer: No Typology Code available for payment source | Attending: Emergency Medicine | Admitting: Emergency Medicine

## 2020-04-24 DIAGNOSIS — Z5321 Procedure and treatment not carried out due to patient leaving prior to being seen by health care provider: Secondary | ICD-10-CM | POA: Insufficient documentation

## 2020-04-24 DIAGNOSIS — M79601 Pain in right arm: Secondary | ICD-10-CM | POA: Insufficient documentation

## 2020-04-24 DIAGNOSIS — M25571 Pain in right ankle and joints of right foot: Secondary | ICD-10-CM | POA: Insufficient documentation

## 2020-04-24 NOTE — ED Triage Notes (Signed)
Pt states she fell on June 16th, pain is getting worse. Pain in rt arm, has brace on wrist.Rt ankle pain

## 2020-04-25 ENCOUNTER — Ambulatory Visit
Admission: EM | Admit: 2020-04-25 | Discharge: 2020-04-25 | Disposition: A | Discharge status: Home / Self Care | Payer: No Typology Code available for payment source | Attending: Physician Assistant | Admitting: Physician Assistant

## 2020-04-25 ENCOUNTER — Ambulatory Visit (INDEPENDENT_AMBULATORY_CARE_PROVIDER_SITE_OTHER): Payer: Self-pay

## 2020-04-25 DIAGNOSIS — M25521 Pain in right elbow: Secondary | ICD-10-CM

## 2020-04-25 DIAGNOSIS — W19XXXA Unspecified fall, initial encounter: Secondary | ICD-10-CM

## 2020-04-25 DIAGNOSIS — M25531 Pain in right wrist: Secondary | ICD-10-CM

## 2020-04-25 MED ORDER — PREDNISONE 10 MG (21) PO TBPK
ORAL_TABLET | Freq: Every day | ORAL | 0 refills | Status: DC
Start: 2020-04-25 — End: 2020-08-18

## 2020-04-25 NOTE — Discharge Instructions (Signed)
X-ray negative for fracture or dislocation.  Start prednisone as directed.  Follow-up with orthopedic/sports medicine if symptoms not improving.

## 2020-04-25 NOTE — ED Triage Notes (Signed)
Pt states fell on concrete on 06/14 and landed on rt side. Pt c/o rt elbow down to rt hand pain that is getting worse. States unable to pick anything up with rt hand.

## 2020-04-25 NOTE — ED Provider Notes (Signed)
[name]    CSN: [id number] Arrival date & time: 04/25/20  1652      History   Chief Complaint Chief Complaint  Patient presents with  . Arm Injury    HPI Mel AlmondVerna Stevenson Follett is a 64 y.o. female.   64 year old female comes in for right upper extremity pain after fall 1 month ago.  At that time, tripped and fell, landing on right side of the body.  Was able to ambulate on own.  States had family event, and was not focused on pain and unknown any swelling.  However, after from the event, had pain to the right wrist, elbow that has been worsening this past 3 to 4 weeks.  Now having trouble picking things up due to the pain.  Depending on movement, can have radiating pain up the arm.     Past Medical History:  Diagnosis Date  . Chronic back pain    HNp,spondylosis,radiculopathy  . COPD (chronic obstructive pulmonary disease) (HCC)   . Hyperlipidemia    was on choloesterol meds last yr-samples given in office but not needed since  . Hypertension    takes Exforge daily  . Joint pain   . Shortness of breath    takes Singulair daily;with exertion  . Sickle cell trait (HCC)   . Spondylolysis   . Tobacco use     Patient Active Problem List   Diagnosis Date Noted  . Screening breast examination 07/26/2019  . Cellulitis 05/17/2018  . Lumbar radiculopathy 02/27/2017  . Poor dentition 02/11/2017  . Acute bronchitis   . Chest pain 05/07/2016  . Chest pain at rest 05/07/2016  . Essential hypertension   . Hyperlipidemia   . Tobacco use   . Spondylolysis   . Chronic obstructive pulmonary disease (HCC)   . Lumbar spondylosis 01/20/2014  . HNP (herniated nucleus pulposus), lumbar 06/04/2012    Past Surgical History:  Procedure Laterality Date  . BACK SURGERY    . BREAST EXCISIONAL BIOPSY Left 2007   benign  . BREAST SURGERY Left    lumpectomy  . COLONOSCOPY    . DILATION AND CURETTAGE OF UTERUS    . LUMBAR LAMINECTOMY/DECOMPRESSION  MICRODISCECTOMY  06/04/2012   Procedure: LUMBAR LAMINECTOMY/DECOMPRESSION MICRODISCECTOMY 1 LEVEL;  Surgeon: Carmela HurtKyle L Cabbell, MD;  Location: [facility];  Service: Neurosurgery;  Laterality: Left;  LEFT Lumbar five-sacral one diskectomy  . LUMBAR LAMINECTOMY/DECOMPRESSION MICRODISCECTOMY Right 06/16/2013   Procedure: RIGHT Lumbar Five-Sacral One Microdiskectomy;  Surgeon: Carmela HurtKyle L Cabbell, MD;  Location: [facility];  Service: Neurosurgery;  Laterality: Right;  RIGHT Lumbar Five-Sacral One Microdiskectomy  . LUMBAR LAMINECTOMY/DECOMPRESSION MICRODISCECTOMY Right 01/20/2014   Procedure: LUMBAR FOUR TO LUMBAR FIVE LUMBAR LAMINECTOMY/DECOMPRESSION MICRODISCECTOMY 1 LEVEL;  Surgeon: Carmela HurtKyle L Cabbell, MD;  Location: [facility];  Service: Neurosurgery;  Laterality: Right;  Right L45 laminectomy and foramintomy    OB History    Gravida  2   Para      Term      Preterm      AB      Living  2     SAB      TAB      Ectopic      Multiple      Live Births  2            Home Medications    Prior to Admission medications   Medication Sig Start Date End Date Taking? Authorizing Provider  amLODipine (NORVASC) 10 MG tablet Take  1 tablet (10 mg total) by mouth daily. 11/29/19   Hoy Register, MD  aspirin EC 81 MG tablet Take 162 mg by mouth daily.    [provider]  azelastine (ASTELIN) 0.1 % nasal spray Place 2 sprays into both nostrils 2 (two) times daily. 01/18/20   Cathie Hoops, Niasia Lanphear V, PA-C  benzonatate (TESSALON) 100 MG capsule TAKE 1 CAPSULE BY MOUTH 2 TIMES DAILY AS NEEDED FOR COUGH. 03/02/20   Hoy Register, MD  carvedilol (COREG) 3.125 MG tablet Take 1 tablet (3.125 mg total) by mouth 2 (two) times daily with a meal. 11/29/19   Hoy Register, MD  cetirizine (ZYRTEC) 10 MG tablet TAKE 1 TABLET BY MOUTH DAILY. 06/08/19   Hoy Register, MD  chlorhexidine (HIBICLENS) 4 % external liquid Apply topically daily as needed. Dilute 10-15 mL in water, Use daily when bathing for 1-2 weeks  12/29/18   Domenick Gong, MD  DULoxetine (CYMBALTA) 60 MG capsule Take 1 capsule (60 mg total) by mouth daily. 11/29/19   Hoy Register, MD  fluticasone (FLONASE) 50 MCG/ACT nasal spray Place 2 sprays into both nostrils daily. 01/18/20   Cathie Hoops, Ysidra Sopher V, PA-C  fluticasone furoate-vilanterol (BREO ELLIPTA) 100-25 MCG/INH AEPB Inhale 1 puff into the lungs daily. 02/29/20   Hoy Register, MD  gabapentin (NEURONTIN) 300 MG capsule Take 2 capsules (600 mg total) by mouth 3 (three) times daily. 11/29/19   Hoy Register, MD  hydrOXYzine (ATARAX/VISTARIL) 25 MG tablet Take 1 tablet (25 mg total) by mouth every 8 (eight) hours as needed. 11/29/19   Hoy Register, MD  ibuprofen (ADVIL) 600 MG tablet Take 1 tablet (600 mg total) by mouth every 6 (six) hours as needed. 02/13/20   Hall-Potvin, [place], PA-C  lactulose (CHRONULAC) 10 GM/15ML solution Take 15 mLs (10 g total) by mouth 2 (two) times daily as needed for mild constipation. 06/23/17   Hoy Register, MD  meclizine (ANTIVERT) 25 MG tablet Take 1 tablet (25 mg total) by mouth 3 (three) times daily as needed for dizziness. 11/29/19   Hoy Register, MD  mirtazapine (REMERON) 30 MG tablet Take 1 tablet (30 mg total) by mouth at bedtime. 07/05/19   Hoy Register, MD  montelukast (SINGULAIR) 10 MG tablet Take 10 mg by mouth daily.     [provider]  Multiple Vitamin (MULTIVITAMIN WITH MINERALS) TABS Take 1 tablet by mouth daily.    [provider]  naproxen (NAPROSYN) 500 MG tablet Take 1 tablet (500 mg total) by mouth 2 (two) times daily. 02/23/20   Fayrene Helper, PA-C  neomycin-polymyxin-hydrocortisone (CORTISPORIN) OTIC solution Place 3 drops into the right ear 4 (four) times daily. 02/13/20   Hall-Potvin, [place], PA-C  pantoprazole (PROTONIX) 40 MG tablet Take 1 tablet (40 mg total) by mouth daily. 11/29/19 12/29/19  Hoy Register, MD  phenazopyridine (PYRIDIUM) 200 MG tablet Take 1 tablet (200 mg total) by mouth 3 (three) times daily.  12/22/19   Wieters, Hallie C, PA-C  polyethylene glycol powder (GLYCOLAX/MIRALAX) powder Take 17 g by mouth daily. 06/09/17   Hoy Register, MD  predniSONE (STERAPRED UNI-PAK 21 TAB) 10 MG (21) TBPK tablet Take by mouth daily. Take 6 tabs by mouth day 1, then 5 tabs, then 4 tabs, then 3 tabs, 2 tabs, then 1 tab for the last day 04/25/20   Cathie Hoops, Cheila Wickstrom V, PA-C  tiZANidine (ZANAFLEX) 4 MG tablet Take 1 tablet (4 mg total) by mouth every 8 (eight) hours as needed for muscle spasms. 11/29/19   Hoy Register, MD  triamcinolone cream (KENALOG) 0.1 % APPLY 1 APPLICATION TOPICALLY 2 (TWO) TIMES DAILY. 04/04/20   Hoy Register, MD  VENTOLIN HFA 108 (90 Base) MCG/ACT inhaler INHALE 2 PUFFS INTO THE LUNGS EVERY 6 HOURS AS NEEDED 11/11/19   Hoy Register, MD    Family History Family History  Problem Relation Age of Onset  . Cancer - Other Mother   . CAD Father        Died age 33  . Hypertension Sister     Social History Social History   Tobacco Use  . Smoking status: Current Every Day Smoker    Packs/day: 0.25    Years: 20.00    Pack years: 5.00    Types: Cigarettes  . Smokeless tobacco: Never Used  Vaping Use  . Vaping Use: Never used  Substance Use Topics  . Alcohol use: No  . Drug use: No     Allergies   Patient has no known allergies.   Review of Systems Review of Systems  Reason unable to perform ROS: See HPI as above.     Physical Exam Triage Vital Signs ED Triage Vitals  Enc Vitals Group     BP 04/25/20 1704 (!) 169/84     Pulse Rate 04/25/20 1704 (!) 109     Resp 04/25/20 1704 18     Temp 04/25/20 1704 98 F (36.7 C)     Temp Source 04/25/20 1704 Oral     SpO2 04/25/20 1704 98 %     Weight --      Height --      Head Circumference --      Peak Flow --      Pain Score 04/25/20 1705 9     Pain Loc --      Pain Edu? --      Excl. in GC? --    No data found.  Updated Vital Signs BP (!) 169/84 (BP Location: Left Arm)   Pulse (!) 109   Temp 98 F (36.7 C)  (Oral)   Resp 18   LMP 03/17/2012   SpO2 98%   Physical Exam Constitutional:      General: She is not in acute distress.    Appearance: Normal appearance. She is well-developed. She is not toxic-appearing or diaphoretic.  HENT:     Head: Normocephalic and atraumatic.  Eyes:     Conjunctiva/sclera: Conjunctivae normal.     Pupils: Pupils are equal, round, and reactive to light.  Pulmonary:     Effort: Pulmonary effort is normal. No respiratory distress.     Comments: Speaking in full sentences without difficulty Musculoskeletal:     Cervical back: Normal range of motion and neck supple.     Comments: No obvious swelling, erythema, warmth. Tender to palpation of lateral epicondyle. Tenderness to palpation diffusely of the wrist. Decreased extension of the elbow. Full ROM of wrist, though with pain. Strength 3/5 of elbow, wrist, grip strength. Sensation intact 5/5. Radial pulse 2+  Skin:    General: Skin is warm and dry.  Neurological:     Mental Status: She is alert and oriented to person, place, and time.      UC Treatments / Results  Labs (all labs ordered are listed, but only abnormal results are displayed) Labs Reviewed - No data to display  EKG   Radiology DG Elbow Complete Right  Result Date: 04/25/2020 CLINICAL DATA:  64 year old female with history of right elbow pain after fall on 03/26/2020. EXAM: RIGHT ELBOW -  COMPLETE 3+ VIEW no priors. COMPARISON:  None. FINDINGS: There is no evidence of fracture, dislocation, or joint effusion. Mild degenerative changes of osteoarthritis with joint space narrowing, subchondral sclerosis and osteophyte formation. Soft tissues are unremarkable. IMPRESSION: 1. No acute radiographic abnormality of the right elbow. 2. Osteoarthritis. Electronically Signed   By: Trudie Reed M.D.   On: 04/25/2020 18:10   DG Wrist Complete Right  Result Date: 04/25/2020 CLINICAL DATA:  Right wrist and elbow pain after fall 03/26/2020. EXAM: RIGHT  WRIST - COMPLETE 3+ VIEW COMPARISON:  None. FINDINGS: No acute or healing fracture. Normal alignment. No dislocation. Normal joint spaces. Scattered degenerative carpal bone cystic changes. Soft tissues are unremarkable. IMPRESSION: No acute or healing fracture of the right wrist. Electronically Signed   By: Narda Rutherford M.D.   On: 04/25/2020 18:11    Procedures Procedures (including critical care time)  Medications Ordered in UC Medications - No data to display  Initial Impression / Assessment and Plan / UC Course  I have reviewed the triage vital signs and the nursing notes.  Pertinent labs & imaging results that were available during my care of the patient were reviewed by me and considered in my medical decision making (see chart for details).    Xray negative. Prednisone as directed. Continue splint as needed. Return precautions given.  Final Clinical Impressions(s) / UC Diagnoses   Final diagnoses:  Right wrist pain  Right elbow pain    ED Prescriptions    Medication Sig Dispense Auth. Provider   predniSONE (STERAPRED UNI-PAK 21 TAB) 10 MG (21) TBPK tablet Take by mouth daily. Take 6 tabs by mouth day 1, then 5 tabs, then 4 tabs, then 3 tabs, 2 tabs, then 1 tab for the last day 21 tablet Belinda Fisher, PA-C     PDMP not reviewed this encounter.   Belinda Fisher, PA-C 04/25/20 2141

## 2020-05-08 ENCOUNTER — Other Ambulatory Visit: Payer: Self-pay | Admitting: Family Medicine

## 2020-05-09 ENCOUNTER — Other Ambulatory Visit: Payer: Self-pay | Admitting: Family Medicine

## 2020-05-09 MED ORDER — BENZONATATE 100 MG PO CAPS: 100.0000 mg | ORAL_CAPSULE | Freq: Two times a day (BID) | ORAL | 0 refills | Status: DC

## 2020-05-09 MED ORDER — PANTOPRAZOLE SODIUM 40 MG PO TBEC: 40.0000 mg | DELAYED_RELEASE_TABLET | Freq: Every day | ORAL | 6 refills | Status: DC

## 2020-05-09 MED FILL — ?PANTOPRAZOLE SODI DR 40MGT: 40 | 30 days supply | Qty: 30 | Fill #0

## 2020-05-09 MED FILL — DULoxetine HCL 60 MG CPEP: 60 | 30 days supply | Qty: 30 | Fill #2

## 2020-05-09 MED FILL — BENZONATATE 100 MG CAPS: 100 | 10 days supply | Qty: 20 | Fill #0

## 2020-05-15 ENCOUNTER — Telehealth: Payer: Self-pay

## 2020-05-15 NOTE — Telephone Encounter (Signed)
Attempted to call patient. Voice mail box not set up. I was unable to leave message from [facility]

## 2020-05-30 ENCOUNTER — Other Ambulatory Visit: Payer: Self-pay

## 2020-05-30 ENCOUNTER — Encounter: Payer: Self-pay | Admitting: Family Medicine

## 2020-05-30 ENCOUNTER — Ambulatory Visit: Payer: Medicare Other | Attending: Family Medicine | Admitting: Family Medicine

## 2020-05-30 ENCOUNTER — Other Ambulatory Visit: Payer: Self-pay | Admitting: Family Medicine

## 2020-05-30 VITALS — BP 139/68 | HR 84 | Ht 61.0 in | Wt 121.2 lb

## 2020-05-30 DIAGNOSIS — M25521 Pain in right elbow: Secondary | ICD-10-CM | POA: Diagnosis not present

## 2020-05-30 DIAGNOSIS — G4709 Other insomnia: Secondary | ICD-10-CM

## 2020-05-30 DIAGNOSIS — J449 Chronic obstructive pulmonary disease, unspecified: Secondary | ICD-10-CM

## 2020-05-30 DIAGNOSIS — E876 Hypokalemia: Secondary | ICD-10-CM

## 2020-05-30 DIAGNOSIS — I10 Essential (primary) hypertension: Secondary | ICD-10-CM

## 2020-05-30 DIAGNOSIS — F419 Anxiety disorder, unspecified: Secondary | ICD-10-CM

## 2020-05-30 DIAGNOSIS — Z23 Encounter for immunization: Secondary | ICD-10-CM | POA: Diagnosis not present

## 2020-05-30 DIAGNOSIS — H9201 Otalgia, right ear: Secondary | ICD-10-CM

## 2020-05-30 DIAGNOSIS — M5416 Radiculopathy, lumbar region: Secondary | ICD-10-CM | POA: Diagnosis not present

## 2020-05-30 DIAGNOSIS — M21619 Bunion of unspecified foot: Secondary | ICD-10-CM

## 2020-05-30 DIAGNOSIS — E2839 Other primary ovarian failure: Secondary | ICD-10-CM

## 2020-05-30 MED ORDER — AMLODIPINE BESYLATE 10 MG PO TABS: 10.0000 mg | ORAL_TABLET | Freq: Every day | ORAL | 1 refills | Status: DC

## 2020-05-30 MED ORDER — ALBUTEROL SULFATE HFA 108 (90 BASE) MCG/ACT IN AERS: INHALATION_SPRAY | RESPIRATORY_TRACT | 2 refills | Status: DC

## 2020-05-30 MED ORDER — BREO ELLIPTA 100-25 MCG/INH IN AEPB: 1.0000 | INHALATION_SPRAY | Freq: Every day | RESPIRATORY_TRACT | 6 refills | Status: DC

## 2020-05-30 MED ORDER — TIZANIDINE HCL 4 MG PO TABS: 4.0000 mg | ORAL_TABLET | Freq: Three times a day (TID) | ORAL | 6 refills | Status: DC | PRN

## 2020-05-30 MED ORDER — CARVEDILOL 3.125 MG PO TABS: 3.1250 mg | ORAL_TABLET | Freq: Two times a day (BID) | ORAL | 1 refills | Status: DC

## 2020-05-30 MED ORDER — GABAPENTIN 300 MG PO CAPS: 600.0000 mg | ORAL_CAPSULE | Freq: Two times a day (BID) | ORAL | 3 refills | Status: DC

## 2020-05-30 MED ORDER — NAPROXEN 500 MG PO TABS: 500.0000 mg | ORAL_TABLET | Freq: Two times a day (BID) | ORAL | 0 refills | Status: DC

## 2020-05-30 MED ORDER — HYDROXYZINE HCL 25 MG PO TABS: 25.0000 mg | ORAL_TABLET | Freq: Three times a day (TID) | ORAL | 2 refills | Status: DC | PRN

## 2020-05-30 MED ORDER — DULOXETINE HCL 60 MG PO CPEP: 60.0000 mg | ORAL_CAPSULE | Freq: Every day | ORAL | 1 refills | Status: DC

## 2020-05-30 MED FILL — BREO ELLIPTA 100-25 MCG INH: 100-25 | 30 days supply | Qty: 60 | Fill #0

## 2020-05-30 MED FILL — ?CARVEDILOL 3.125 MG TABLET: 3.125 | 30 days supply | Qty: 60 | Fill #0

## 2020-05-30 MED FILL — !VENTOLIN HFA INHALER: 108 (90 BAS | 25 days supply | Qty: 18 | Fill #0

## 2020-05-30 MED FILL — NAPROXEN 500 MG TABLET: 500 | 15 days supply | Qty: 30 | Fill #0

## 2020-05-30 MED FILL — hydrOXYzine HCL 25 MG TABS: 25 | 30 days supply | Qty: 90 | Fill #0

## 2020-05-30 MED FILL — AMLODIPINE BESYLATE 10 MG T: 10 | 30 days supply | Qty: 30 | Fill #0

## 2020-05-30 MED FILL — tiZANidine HCL 4 MG TABS: 4 | 30 days supply | Qty: 90 | Fill #0

## 2020-05-30 MED FILL — GABAPENTIN 300 MG CAPSULE: 300 | 30 days supply | Qty: 120 | Fill #0

## 2020-05-30 NOTE — Patient Instructions (Addendum)
Diclofenac skin gel What is this medicine? DICLOFENAC (dye KLOE fen ak) is a non-steroidal anti-inflammatory drug (NSAID). The 1% skin gel is used to treat osteoarthritis of the hands or knees. The 3% skin gel is used to treat actinic keratosis. This medicine may be used for other purposes; ask your health care provider or pharmacist if you have questions. COMMON BRAND NAME(S): DSG Pak, Omeca, Solaravix, Solaraze, Voltaren Arthritis, Voltaren Gel What should I tell my health care provider before I take this medicine? They need to know if you have any of these conditions:  asthma  bleeding problems  coronary artery bypass graft (CABG) surgery within the past 2 weeks  heart disease  high blood pressure  if you frequently drink alcohol containing drinks  kidney disease  liver disease  open or infected skin  stomach problems  an unusual or allergic reaction to diclofenac, aspirin, other NSAIDs, other medicines, benzyl alcohol (3% gel only), foods, dyes, or preservatives  pregnant or trying to get pregnant  breast-feeding How should I use this medicine? This medicine is for external use only. Follow the directions on the prescription label. Wash hands before and after use. Do not get this medicine in your eyes. If you do, rinse out with plenty of cool tap water. Use your doses at regular intervals. Do not use your medicine more often than directed. A special MedGuide will be given to you by the pharmacist with each prescription and refill of the 1% gel. Be sure to read this information carefully each time. Talk to your pediatrician regarding the use of this medicine in children. Special care may be needed. The 3% gel is not approved for use in children. Overdosage: If you think you have taken too much of this medicine contact a poison control center or emergency room at once. NOTE: This medicine is only for you. Do not share this medicine with others. What if I miss a dose? If you  miss a dose, use it as soon as you can. If it is almost time for your next dose, use only that dose. Do not use double or extra doses. What may interact with this medicine?  aspirin  NSAIDs, medicines for pain and inflammation, like ibuprofen or naproxen Do not use any other skin products without telling your doctor or health care professional. This list may not describe all possible interactions. Give your health care provider a list of all the medicines, herbs, non-prescription drugs, or dietary supplements you use. Also tell them if you smoke, drink alcohol, or use illegal drugs. Some items may interact with your medicine. What should I watch for while using this medicine? Tell your doctor or healthcare provider if your symptoms do not start to get better or if they get worse. You will need to follow up with your healthcare provider to monitor your progress. You may need to be treated for up to 3 months if you are using the 3% gel, but the full effect may not occur until 1 month after stopping treatment. If you develop a severe skin reaction, contact your doctor or healthcare provider immediately. This medicine may cause serious skin reactions. They can happen weeks to months after starting the medicine. Contact your healthcare provider right away if you notice fevers or flu-like symptoms with a rash. The rash may be red or purple and then turn into blisters or peeling of the skin. Or, you might notice a red rash with swelling of the face, lips or lymph nodes in  your neck or under your arms. This medicine can make you more sensitive to the sun. Keep out of the sun. If you cannot avoid being in the sun, wear protective clothing and use sunscreen. Do not use sun lamps or tanning beds/booths. Do not take medicines such as ibuprofen and naproxen with this medicine. Side effects such as stomach upset, nausea, or ulcers may be more likely to occur. Many medicines available without a prescription should not  be taken with this medicine. This medicine does not prevent heart attack or stroke. In fact, this medicine may increase the chance of a heart attack or stroke. The chance may increase with longer use of this medicine and in people who have heart disease. If you take aspirin to prevent heart attack or stroke, talk with your doctor or healthcare provider. This medicine can cause ulcers and bleeding in the stomach and intestines at any time during treatment. Do not smoke cigarettes or drink alcohol. These increase irritation to your stomach and can make it more susceptible to damage from this medicine. Ulcers and bleeding can happen without warning symptoms and can cause death. You may get drowsy or dizzy. Do not drive, use machinery, or do anything that needs mental alertness until you know how this medicine affects you. Do not stand or sit up quickly, especially if you are an older patient. This reduces the risk of dizzy or fainting spells. This medicine can cause you to bleed more easily. Try to avoid damage to your teeth and gums when you brush or floss your teeth. What side effects may I notice from receiving this medicine? Side effects that you should report to your doctor or health care professional as soon as possible:  allergic reactions like skin rash, itching or hives, swelling of the face, lips, or tongue  black or bloody stools, blood in the urine or vomit  blurred vision  chest pain  difficulty breathing or wheezing  nausea or vomiting  rash, fever, and swollen lymph nodes  redness, blistering, peeling or loosening of the skin, including inside the mouth  slurred speech or weakness on one side of the body  trouble passing urine or change in the amount of urine  unexplained weight gain or swelling  unusually weak or tired  yellowing of eyes or skin Side effects that usually do not require medical attention (report to your doctor or health care professional if they continue  or are bothersome):  dizziness  dry skin  headache  heartburn  increased sensitivity to the sun  stomach pain  tingling at the application site This list may not describe all possible side effects. Call your doctor for medical advice about side effects. You may report side effects to FDA at 1-800-FDA-1088. Where should I keep my medicine? Keep out of the reach of children. Store the 1% gel at room temperature between 15 and 30 degrees C (59 and 86 degrees F). Store the 3% gel at room temperature between 20 and 25 degrees C (68 and 77 degrees F). Protect from light. Throw away any unused medicine after the expiration date. NOTE: This sheet is a summary. It may not cover all possible information. If you have questions about this medicine, talk to your doctor, pharmacist, or health care provider.  2020 Elsevier/Gold Standard (2018-12-15 13:05:18) Influenza Virus Vaccine injection (Fluarix) What is this medicine? INFLUENZA VIRUS VACCINE (in floo EN zuh VAHY ruhs vak SEEN) helps to reduce the risk of getting influenza also known as the flu.  This medicine may be used for other purposes; ask your health care provider or pharmacist if you have questions. COMMON BRAND NAME(S): Fluarix, Fluzone What should I tell my health care provider before I take this medicine? They need to know if you have any of these conditions:  bleeding disorder like hemophilia  fever or infection  Guillain-Barre syndrome or other neurological problems  immune system problems  infection with the human immunodeficiency virus (HIV) or AIDS  low blood platelet counts  multiple sclerosis  an unusual or allergic reaction to influenza virus vaccine, eggs, chicken proteins, latex, gentamicin, other medicines, foods, dyes or preservatives  pregnant or trying to get pregnant  breast-feeding How should I use this medicine? This vaccine is for injection into a muscle. It is given by a health care  professional. A copy of Vaccine Information Statements will be given before each vaccination. Read this sheet carefully each time. The sheet may change frequently. Talk to your pediatrician regarding the use of this medicine in children. Special care may be needed. Overdosage: If you think you have taken too much of this medicine contact a poison control center or emergency room at once. NOTE: This medicine is only for you. Do not share this medicine with others. What if I miss a dose? This does not apply. What may interact with this medicine?  chemotherapy or radiation therapy  medicines that lower your immune system like etanercept, anakinra, infliximab, and adalimumab  medicines that treat or prevent blood clots like warfarin  phenytoin  steroid medicines like prednisone or cortisone  theophylline  vaccines This list may not describe all possible interactions. Give your health care provider a list of all the medicines, herbs, non-prescription drugs, or dietary supplements you use. Also tell them if you smoke, drink alcohol, or use illegal drugs. Some items may interact with your medicine. What should I watch for while using this medicine? Report any side effects that do not go away within 3 days to your doctor or health care professional. Call your health care provider if any unusual symptoms occur within 6 weeks of receiving this vaccine. You may still catch the flu, but the illness is not usually as bad. You cannot get the flu from the vaccine. The vaccine will not protect against colds or other illnesses that may cause fever. The vaccine is needed every year. What side effects may I notice from receiving this medicine? Side effects that you should report to your doctor or health care professional as soon as possible:  allergic reactions like skin rash, itching or hives, swelling of the face, lips, or tongue Side effects that usually do not require medical attention (report to your  doctor or health care professional if they continue or are bothersome):  fever  headache  muscle aches and pains  pain, tenderness, redness, or swelling at site where injected  weak or tired This list may not describe all possible side effects. Call your doctor for medical advice about side effects. You may report side effects to FDA at 1-800-FDA-1088. Where should I keep my medicine? This vaccine is only given in a clinic, pharmacy, doctor's office, or other health care setting and will not be stored at home. NOTE: This sheet is a summary. It may not cover all possible information. If you have questions about this medicine, talk to your doctor, pharmacist, or health care provider.  2020 Elsevier/Gold Standard (2008-04-26 09:30:40)

## 2020-05-30 NOTE — Progress Notes (Signed)
Name: Tammy Schwartz   MRN: [id number]    DOB: April 21, 1956   Date:05/30/2020       Progress Note  Subjective  Chief Complaint  Chief Complaint  Patient presents with   Hypertension    HPI  64 year old female with a history of hypertension, tobacco use, COPD, lumbar spondylosis post back surgery, anxiety who present for a 6 month follow-up visit.   Hypertension: Currently taking amlodipine 10 mg and carvedilol 3.167m. Reports not taking this morning because she was busy and forgot to take it. Denies blurred vision, headaches, or dizziness.   Right elbow pain: [place]. Pain reported 7/10. Reports significant pain when picking of jug of milk or bending elbow. On 7/14, she went to urgent care for right elbow pain. She had an x-ray of her right elbow and wrist. Findings revealed no acute or healing fractures. Normal alignment. No dislocation. Normal joint spaces. Scattered degenerative carpal bone cystic changes. Mild degenerative changes of osteoarthritis with joint space, subchondral sclerosis and osteophyte formation. Soft tissues are unremarkable.Was prescribed prednisone at that time, however patient stopped taking it because it made her "stomach feel weird."  COPD- Stable. Denies increased shortness of breath, sputum production or cough. Patient is a current smoker.   Anxiety- Stable on hydroxyzine.   Lumbar radiculopathy- Complains of back pain, which radiates to her lower extremities. States her right leg gives out sometimes, however she uses a cane to prevent falls. Currently taking gabapentin, duloxetine, and tizanidine. Gabapentin helps when she takes it, however does not take it in the day because it makes her drowsy. She does not want any spinal injections or surgery. Report PT being ineffective when used previously. Reports using home exercise.    Bilateral bunion- Would like bunions removed  bilaterally. Waiting on insurance to see coverage.  Right ear pain-States she has been having pain since her covid vaccine back in April. Denies any hearing loss or tinnitus. Reports pain internally and around right ear. Denies any redness or swelling.   Patient would like influenza vaccine. Patient is due for bone density screening.   Patient Active Problem List   Diagnosis Date Noted   Screening breast examination 07/26/2019   Cellulitis 05/17/2018   Lumbar radiculopathy 02/27/2017   Poor dentition 02/11/2017   Acute bronchitis    Chest pain 05/07/2016   Chest pain at rest 05/07/2016   Essential hypertension    Hyperlipidemia    Tobacco use    Spondylolysis    Chronic obstructive pulmonary disease (HCC)    Lumbar spondylosis 01/20/2014   HNP (herniated nucleus pulposus), lumbar 06/04/2012    Past Medical History:  Diagnosis Date   Chronic back pain    HNp,spondylosis,radiculopathy   COPD (chronic obstructive pulmonary disease) (HCC)    Hyperlipidemia    was on choloesterol meds last yr-samples given in office but not needed since   Hypertension    takes Exforge daily   Joint pain    Shortness of breath    takes Singulair daily;with exertion   [place]le [place]    Spondylolysis    Tobacco use     Past Surgical History:  Procedure Laterality Date   BACK SURGERY     BREAST EXCISIONAL BIOPSY Left 2007   benign   BREAST SURGERY Left    lumpectomy   COLONOSCOPY     DILATION AND CURETTAGE OF UTERUS     LUMBAR  LAMINECTOMY/DECOMPRESSION MICRODISCECTOMY  06/04/2012   Procedure: LUMBAR LAMINECTOMY/DECOMPRESSION MICRODISCECTOMY 1 LEVEL;  Surgeon: Winfield Cunas, MD;  Location: [facility];  Service: Neurosurgery;  Laterality: Left;  LEFT Lumbar five-sacral one diskectomy   LUMBAR LAMINECTOMY/DECOMPRESSION MICRODISCECTOMY Right 06/16/2013   Procedure: RIGHT Lumbar Five-Sacral One Microdiskectomy;  Surgeon: Winfield Cunas, MD;  Location: [facility];  Service: Neurosurgery;  Laterality: Right;  RIGHT Lumbar Five-Sacral One Microdiskectomy   LUMBAR LAMINECTOMY/DECOMPRESSION MICRODISCECTOMY Right 01/20/2014   Procedure: LUMBAR FOUR TO LUMBAR FIVE LUMBAR LAMINECTOMY/DECOMPRESSION MICRODISCECTOMY 1 LEVEL;  Surgeon: Winfield Cunas, MD;  Location: [facility];  Service: Neurosurgery;  Laterality: Right;  Right L45 laminectomy and foramintomy    Social History   Tobacco Use   Smoking status: Current Every Day Smoker    Packs/day: 0.25    Years: 20.00    Pack years: 5.00    Types: Cigarettes   Smokeless tobacco: Never Used  Substance Use Topics   Alcohol use: No     Current Outpatient Medications:    amLODipine (NORVASC) 10 MG tablet, Take 1 tablet (10 mg total) by mouth daily., Disp: 90 tablet, Rfl: 1   aspirin EC 81 MG tablet, Take 162 mg by mouth daily., Disp: , Rfl:    azelastine (ASTELIN) 0.1 % nasal spray, Place 2 sprays into both nostrils 2 (two) times daily., Disp: 30 mL, Rfl: 0   benzonatate (TESSALON) 100 MG capsule, Take 1 capsule (100 mg total) by mouth 2 (two) times daily., Disp: 20 capsule, Rfl: 0   carvedilol (COREG) 3.125 MG tablet, Take 1 tablet (3.125 mg total) by mouth 2 (two) times daily with a meal., Disp: 180 tablet, Rfl: 1   cetirizine (ZYRTEC) 10 MG tablet, TAKE 1 TABLET BY MOUTH DAILY., Disp: 30 tablet, Rfl: 2   chlorhexidine (HIBICLENS) 4 % external liquid, Apply topically daily as needed. Dilute 10-15 mL in water, Use daily when bathing for 1-2 weeks, Disp: 120 mL, Rfl: 0   DULoxetine (CYMBALTA) 60 MG capsule, Take 1 capsule (60 mg total) by mouth daily., Disp: 90 capsule, Rfl: 1   fluticasone (FLONASE) 50 MCG/ACT nasal spray, Place 2 sprays into both nostrils daily., Disp: 1 g, Rfl: 0   fluticasone furoate-vilanterol (BREO ELLIPTA) 100-25 MCG/INH AEPB, Inhale 1 puff into the lungs daily., Disp: 60 each, Rfl: 2   gabapentin (NEURONTIN) 300 MG capsule, Take 2 capsules (600 mg total) by  mouth 3 (three) times daily., Disp: 180 capsule, Rfl: 3   hydrOXYzine (ATARAX/VISTARIL) 25 MG tablet, Take 1 tablet (25 mg total) by mouth every 8 (eight) hours as needed., Disp: 90 tablet, Rfl: 2   ibuprofen (ADVIL) 600 MG tablet, Take 1 tablet (600 mg total) by mouth every 6 (six) hours as needed., Disp: 30 tablet, Rfl: 0   lactulose (CHRONULAC) 10 GM/15ML solution, Take 15 mLs (10 g total) by mouth 2 (two) times daily as needed for mild constipation., Disp: 946 mL, Rfl: 0   meclizine (ANTIVERT) 25 MG tablet, Take 1 tablet (25 mg total) by mouth 3 (three) times daily as needed for dizziness., Disp: 60 tablet, Rfl: 1   mirtazapine (REMERON) 30 MG tablet, Take 1 tablet (30 mg total) by mouth at bedtime., Disp: 30 tablet, Rfl: 6   montelukast (SINGULAIR) 10 MG tablet, Take 10 mg by mouth daily. , Disp: , Rfl:    Multiple Vitamin (MULTIVITAMIN WITH MINERALS) TABS, Take 1 tablet by mouth daily., Disp: , Rfl:    naproxen (NAPROSYN) 500 MG tablet, Take  1 tablet (500 mg total) by mouth 2 (two) times daily., Disp: 30 tablet, Rfl: 0   neomycin-polymyxin-hydrocortisone (CORTISPORIN) OTIC solution, Place 3 drops into the right ear 4 (four) times daily., Disp: 10 mL, Rfl: 0   pantoprazole (PROTONIX) 40 MG tablet, Take 1 tablet (40 mg total) by mouth daily., Disp: 30 tablet, Rfl: 6   phenazopyridine (PYRIDIUM) 200 MG tablet, Take 1 tablet (200 mg total) by mouth 3 (three) times daily., Disp: 6 tablet, Rfl: 0   polyethylene glycol powder (GLYCOLAX/MIRALAX) powder, Take 17 g by mouth daily., Disp: 3350 g, Rfl: 1   predniSONE (STERAPRED UNI-PAK 21 TAB) 10 MG (21) TBPK tablet, Take by mouth daily. Take 6 tabs by mouth day 1, then 5 tabs, then 4 tabs, then 3 tabs, 2 tabs, then 1 tab for the last day, Disp: 21 tablet, Rfl: 0   tiZANidine (ZANAFLEX) 4 MG tablet, Take 1 tablet (4 mg total) by mouth every 8 (eight) hours as needed for muscle spasms., Disp: 90 tablet, Rfl: 6   triamcinolone cream (KENALOG)  0.1 %, APPLY 1 APPLICATION TOPICALLY 2 (TWO) TIMES DAILY., Disp: 80 g, Rfl: 0   VENTOLIN HFA 108 (90 Base) MCG/ACT inhaler, INHALE 2 PUFFS INTO THE LUNGS EVERY 6 HOURS AS NEEDED, Disp: 36 g, Rfl: 2  No Known Allergies  Review of Systems  Constitutional: Negative for chills, fever, malaise/fatigue and weight loss.  HENT: Positive for ear pain. Negative for hearing loss and tinnitus.   Eyes: Negative for blurred vision.  Respiratory: Negative for cough, sputum production, shortness of breath and wheezing.   Cardiovascular: Negative for chest pain and palpitations.  Gastrointestinal: Negative for abdominal pain, constipation and diarrhea.  Musculoskeletal: Positive for back pain.       Positive right elbow pain   Skin: Negative for rash.  Neurological: Negative for dizziness and headaches.    No other specific complaints in a complete review of systems (except as listed in HPI above).  Objective  Vitals:   05/30/20 1405  BP: 139/68  Pulse: 84  SpO2: 98%     Physical Exam Constitutional:      Appearance: Normal appearance.  HENT:     Left Ear: External ear normal. No tenderness.     Ears:     Comments: Right ear: Tympanic membrane and external ear normal. No swelling or erythema present. Tenderness present near tragus and behind ear with palpation. No enlargement of preauricular or postauricular lymph nodes.There is impacted cerumen in the ear canal. No perforation or redness noted on tympanic membrane. Cardiovascular:     Rate and Rhythm: Normal rate and regular rhythm.     Pulses:          Radial pulses are 2+ on the right side and 2+ on the left side.     Heart sounds: S1 normal and S2 normal.  Pulmonary:     Effort: Pulmonary effort is normal. No respiratory distress.     Breath sounds: No wheezing or rhonchi.  Musculoskeletal:        General: Normal range of motion.     Cervical back: Normal range of motion.     Right foot: Bunion present.     Left foot: Bunion  present.     Comments: Right elbow.  No erythema or deformity present. Slight swelling lateral aspect of elbow. Skin warm and intact. Tenderness with palpation. Full active and passive range of motion. Pain with flexion of elbow.  Left elbow: No erythema or deformity present.  No edema present. Skin warm and intact. Non-tender. Full active and passive range of motion  Positive straight leg test on the right  Lymphadenopathy:     Cervical: No cervical adenopathy.  Skin:    General: Skin is warm and dry.  Neurological:     General: No focal deficit present.     Mental Status: She is alert and oriented to person, place, and time.  Psychiatric:        Attention and Perception: Attention and perception normal.      Assessment & Plan  1. Right elbow pain X-ray results unremarkable, only revealing osteoarthritis. Provided education about apply ice to affected area and elevating elbow. Counseled patient about using topical vs oral NSAID. Patient will see how much Voltaren gel costs as insurance does not cover this medication. Provided information about Voltaren gel in [facility] if she would like to use it instead of naproxen. No history of GI bleeding. - naproxen (NAPROSYN) 500 MG tablet; Take 1 tablet (500 mg total) by mouth 2 (two) times daily.  Dispense: 30 tablet; Refill: 0  2. Essential hypertension Blood pressure slightly above blood pressure goal of 130/80. Patient did not take blood pressure medication this morning, therefore this can elevation in blood pressure. Recommended lifestyle changes, such as low-sodium, medication compliance, and daily, moderate-intensity exercise. No changes in medication at this time.  - CMP14+EGFR; Future - Lipid panel; Future - amLODipine (NORVASC) 10 MG tablet; Take 1 tablet (10 mg total) by mouth daily.  Dispense: 90 tablet; Refill: 1 - carvedilol (COREG) 3.125 MG tablet; Take 1 tablet (3.125 mg total) by mouth 2 (two) times daily with a meal.  Dispense: 180  tablet; Refill: 1  3. Lumbar radiculopathy -Uncontrolled at this moment. Patient does not want surgery, spinal injections, or PT therapy at this time. Reports will continue using home exercise. Declines any further therapies at this time.  - DULoxetine (CYMBALTA) 60 MG capsule; Take 1 capsule (60 mg total) by mouth daily.  Dispense: 90 capsule; Refill: 1 - gabapentin (NEURONTIN) 300 MG capsule; Take 2 capsules (600 mg total) by mouth 2 (two) times daily.  Dispense: 120 capsule; Refill: 3 - tiZANidine (ZANAFLEX) 4 MG tablet; Take 1 tablet (4 mg total) by mouth every 8 (eight) hours as needed for muscle spasms.  Dispense: 90 tablet; Refill: 6  4. Chronic obstructive pulmonary disease, unspecified COPD type (HCC) Stable, no changes necessary. Discussed smoking cessation.  - albuterol (VENTOLIN HFA) 108 (90 Base) MCG/ACT inhaler; INHALE 2 PUFFS INTO THE LUNGS EVERY 6 HOURS AS NEEDED  Dispense: 36 g; Refill: 2 - fluticasone furoate-vilanterol (BREO ELLIPTA) 100-25 MCG/INH AEPB; Inhale 1 puff into the lungs daily.  Dispense: 60 each; Refill: 6  5. Anxiety Stable, no changes necessary.  - hydrOXYzine (ATARAX/VISTARIL) 25 MG tablet; Take 1 tablet (25 mg total) by mouth every 8 (eight) hours as needed.  Dispense: 90 tablet; Refill: 2  6. Other insomnia Stable  7. Hypokalemia Will follow-up on basic metabolic panel. Will make changes as necessary.  8. Right ear pain Pain appears to be from excess cerumen production. Educated patient about over the counter ear drops to soften ear cerumen. Follow-up as necessary.  9. Bunion Would like both bunions removed surgically. Will speak to [facility] regarding financial cost of referral/surgery before making decision.  10. Estrogen deficiency Will follow-up on results of Bone Density screening. - DG Bone Density; Future  11. Need for immunization against influenza Received flu vaccine. - Flu Vaccine QUAD 36+ mos  IM   Return in about 6 months (around  11/30/2020) for chronic disease management.    Evaluation and management procedures were performed by me with NP Student in attendance, note written by NP student under my supervision and collaboration. I have reviewed the note and I agree with the management and plan.  In summary Ms. Delrosario presents today for chronic disease management and continues to suffer from chronic low back pain as a result of herniated lumbar disc and lumbar spondylosis which has not responded to PT but she is not open to [facility] or surgery at this time.  Symptoms are tolerable with her current regimen and she would like to remain on this.  Advised her right elbow pain will improve gradually over time. With regards to her ear symptoms, my impression is this is coincidental with the timing of her COVID-19 vaccine as she does have cerumen impaction but no other symptoms and is unlikely a vaccine adverse reaction. She is undecided about smoking cessation and we will readdress this again at a subsequent visit.  Charlott Rakes, MD, FAAFP. [facility] [place], [place]   05/30/2020, 4:44 PM

## 2020-05-31 ENCOUNTER — Other Ambulatory Visit: Payer: Self-pay

## 2020-05-31 ENCOUNTER — Ambulatory Visit
Admission: EM | Admit: 2020-05-31 | Discharge: 2020-05-31 | Disposition: A | Discharge status: Home / Self Care | Payer: Medicare Other | Attending: Emergency Medicine | Admitting: Emergency Medicine

## 2020-05-31 ENCOUNTER — Encounter: Payer: Self-pay | Admitting: Emergency Medicine

## 2020-05-31 DIAGNOSIS — M7918 Myalgia, other site: Secondary | ICD-10-CM | POA: Diagnosis not present

## 2020-05-31 MED ORDER — TIZANIDINE HCL 2 MG PO TABS: 2.0000 mg | ORAL_TABLET | Freq: Four times a day (QID) | ORAL | 0 refills | Status: AC | PRN

## 2020-05-31 MED ORDER — NAPROXEN 500 MG PO TABS: 500.0000 mg | ORAL_TABLET | Freq: Two times a day (BID) | ORAL | 0 refills | Status: DC

## 2020-05-31 NOTE — Discharge Instructions (Addendum)
RICE: rest, ice, compression, elevation as needed for pain.    Heat therapy (hot compress, warm wash rag, hot showers, etc.) can help relax muscles and soothe muscle aches. Cold therapy (ice packs) can be used to help swelling both after injury and after prolonged use of areas of chronic pain/aches.  Pain medication:  500 mg Naprosyn/Aleve (naproxen) every 12 hours with food:  AVOID other NSAIDs while taking this (may have Tylenol).  May take muscle relaxer as needed for severe pain / spasm.  (This medication may cause you to become tired so it is important you do not drink alcohol or operate heavy machinery while on this medication.  Recommend your first dose to be taken before bedtime to monitor for side effects safely)  Important to follow up with specialist(s) below for further evaluation/management if your symptoms persist or worsen. 
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
## 2020-06-01 ENCOUNTER — Other Ambulatory Visit: Payer: Self-pay | Admitting: Family Medicine

## 2020-06-01 DIAGNOSIS — Z1231 Encounter for screening mammogram for malignant neoplasm of breast: Secondary | ICD-10-CM

## 2020-06-25 ENCOUNTER — Other Ambulatory Visit: Payer: Self-pay | Admitting: Family Medicine

## 2020-06-25 MED FILL — PANTOPRAZOLE SOD DR 40 MG T: 40 | 30 days supply | Qty: 30 | Fill #1

## 2020-07-16 ENCOUNTER — Telehealth: Payer: Self-pay | Admitting: Family Medicine

## 2020-07-16 NOTE — Telephone Encounter (Signed)
Patient is calling to renew her blue card for the pharmacy please advise [phone]

## 2020-07-17 NOTE — Telephone Encounter (Signed)
Pt knows she has to wait until her financial appt on 07/27/20

## 2020-07-27 ENCOUNTER — Ambulatory Visit: Payer: Medicare Other | Attending: Family Medicine

## 2020-07-27 ENCOUNTER — Other Ambulatory Visit: Payer: Self-pay

## 2020-08-01 ENCOUNTER — Telehealth: Payer: Self-pay

## 2020-08-01 DIAGNOSIS — J449 Chronic obstructive pulmonary disease, unspecified: Secondary | ICD-10-CM

## 2020-08-01 NOTE — Telephone Encounter (Signed)
Copied from CRM [id number]. Topic: General - Other >> Jul 31, 2020  4:41 PM Dalphine Handing A wrote: Patient got a letter from [employer] and ventilators will no loner be given through there and patient needs a new order placed for ventilator. Patient can only get one more ventilator through [employer].  Please advise  Best contact [phone]

## 2020-08-03 MED FILL — !VENTOLIN HFA INHALER: 108 (90 BAS | 25 days supply | Qty: 18 | Fill #1

## 2020-08-03 MED FILL — !BREO ELLIPTA 100-25 MCG IN: 100-25 | 30 days supply | Qty: 60 | Fill #1

## 2020-08-03 MED FILL — PANTOPRAZOLE SOD DR 40 MG T: 40 | 30 days supply | Qty: 30 | Fill #2

## 2020-08-06 ENCOUNTER — Telehealth: Payer: Self-pay

## 2020-08-06 MED ORDER — ALBUTEROL SULFATE HFA 108 (90 BASE) MCG/ACT IN AERS: INHALATION_SPRAY | RESPIRATORY_TRACT | 2 refills | Status: DC

## 2020-08-06 NOTE — Telephone Encounter (Signed)
We have a signed application for her Bre[employer] re-enrollment.  We left a message for patient-we need clarification regarding her income amount.

## 2020-08-06 NOTE — Addendum Note (Signed)
Addended by: Jonah Blue B on: 08/06/2020 06:09 PM   Modules accepted: Orders

## 2020-08-06 NOTE — Telephone Encounter (Signed)
Her enrollment is expired as far as I can tell.  Ventolin is no longer offered through [facility].  Patient may apply for Proair HFA thru [facility].  Proair is the only available albuterol thru [facility] at this time.

## 2020-08-06 NOTE — Telephone Encounter (Signed)
Opened in error

## 2020-08-06 NOTE — Telephone Encounter (Signed)
Yes ma'am I believe she is referring to Ventolin. I have included Tresa Endo on this.   Tresa Endo, is pt still getting MAP for Ventolin?

## 2020-08-18 ENCOUNTER — Other Ambulatory Visit: Payer: Self-pay

## 2020-08-18 ENCOUNTER — Ambulatory Visit
Admission: EM | Admit: 2020-08-18 | Discharge: 2020-08-18 | Disposition: A | Discharge status: Home / Self Care | Payer: Medicare Other | Attending: Physician Assistant | Admitting: Physician Assistant

## 2020-08-18 ENCOUNTER — Encounter: Payer: Self-pay | Admitting: Physician Assistant

## 2020-08-18 DIAGNOSIS — J0140 Acute pansinusitis, unspecified: Secondary | ICD-10-CM

## 2020-08-18 MED ORDER — DOXYCYCLINE HYCLATE 100 MG PO CAPS: 100.0000 mg | ORAL_CAPSULE | Freq: Two times a day (BID) | ORAL | 0 refills | Status: DC

## 2020-08-18 MED ORDER — PREDNISONE 50 MG PO TABS: 50.0000 mg | ORAL_TABLET | Freq: Every day | ORAL | 0 refills | Status: DC

## 2020-08-18 MED ORDER — BENZONATATE 200 MG PO CAPS: 200.0000 mg | ORAL_CAPSULE | Freq: Three times a day (TID) | ORAL | 0 refills | Status: DC

## 2020-08-18 NOTE — ED Provider Notes (Signed)
[name]    CSN: [id number] Arrival date & time: 08/18/20  1547      History   Chief Complaint No chief complaint on file.   HPI Tammy Schwartz is a 64 y.o. female.   64 year old female comes in for 1.5-2 week history  of URI symptoms. Cough, nasal congestion. Denies fever, chills, body aches. Denies shortness of breath, loss of taste/smell.       Past Medical History:  Diagnosis Date  . Chronic back pain    HNp,spondylosis,radiculopathy  . COPD (chronic obstructive pulmonary disease) (HCC)   . Hyperlipidemia    was on choloesterol meds last yr-samples given in office but not needed since  . Hypertension    takes Exforge daily  . Joint pain   . Shortness of breath    takes Singulair daily;with exertion  . Sickle cell trait (HCC)   . Spondylolysis   . Tobacco use     Patient Active Problem List   Diagnosis Date Noted  . Screening breast examination 07/26/2019  . Cellulitis 05/17/2018  . Lumbar radiculopathy 02/27/2017  . Poor dentition 02/11/2017  . Acute bronchitis   . Chest pain 05/07/2016  . Chest pain at rest 05/07/2016  . Essential hypertension   . Hyperlipidemia   . Tobacco use   . Spondylolysis   . Chronic obstructive pulmonary disease (HCC)   . Lumbar spondylosis 01/20/2014  . HNP (herniated nucleus pulposus), lumbar 06/04/2012    Past Surgical History:  Procedure Laterality Date  . BACK SURGERY    . BREAST EXCISIONAL BIOPSY Left 2007   benign  . BREAST SURGERY Left    lumpectomy  . COLONOSCOPY    . DILATION AND CURETTAGE OF UTERUS    . LUMBAR LAMINECTOMY/DECOMPRESSION MICRODISCECTOMY  06/04/2012   Procedure: LUMBAR LAMINECTOMY/DECOMPRESSION MICRODISCECTOMY 1 LEVEL;  Surgeon: Carmela Hurt, MD;  Location: [facility];  Service: Neurosurgery;  Laterality: Left;  LEFT Lumbar five-sacral one diskectomy  . LUMBAR LAMINECTOMY/DECOMPRESSION MICRODISCECTOMY Right 06/16/2013   Procedure: RIGHT Lumbar Five-Sacral One  Microdiskectomy;  Surgeon: Carmela Hurt, MD;  Location: [facility];  Service: Neurosurgery;  Laterality: Right;  RIGHT Lumbar Five-Sacral One Microdiskectomy  . LUMBAR LAMINECTOMY/DECOMPRESSION MICRODISCECTOMY Right 01/20/2014   Procedure: LUMBAR FOUR TO LUMBAR FIVE LUMBAR LAMINECTOMY/DECOMPRESSION MICRODISCECTOMY 1 LEVEL;  Surgeon: Carmela Hurt, MD;  Location: [facility];  Service: Neurosurgery;  Laterality: Right;  Right L45 laminectomy and foramintomy    OB History    Gravida  2   Para      Term      Preterm      AB      Living  2     SAB      TAB      Ectopic      Multiple      Live Births  2            Home Medications    Prior to Admission medications   Medication Sig Start Date End Date Taking? Authorizing Provider  albuterol (VENTOLIN HFA) 108 (90 Base) MCG/ACT inhaler INHALE 2 PUFFS INTO THE LUNGS EVERY 6 HOURS AS NEEDED 08/06/20   Marcine Matar, MD  amLODipine (NORVASC) 10 MG tablet Take 1 tablet (10 mg total) by mouth daily. 05/30/20   Hoy Register, MD  aspirin EC 81 MG tablet Take 162 mg by mouth daily.    [provider]  azelastine (ASTELIN) 0.1 % nasal spray Place 2 sprays into  both nostrils 2 (two) times daily. 01/18/20   Cathie Hoops, Taisha Pennebaker V, PA-C  benzonatate (TESSALON) 200 MG capsule Take 1 capsule (200 mg total) by mouth every 8 (eight) hours. 08/18/20   Cathie Hoops, Shameria Trimarco V, PA-C  carvedilol (COREG) 3.125 MG tablet Take 1 tablet (3.125 mg total) by mouth 2 (two) times daily with a meal. 05/30/20   Hoy Register, MD  cetirizine (ZYRTEC) 10 MG tablet TAKE 1 TABLET BY MOUTH DAILY. 06/08/19   Hoy Register, MD  doxycycline (VIBRAMYCIN) 100 MG capsule Take 1 capsule (100 mg total) by mouth 2 (two) times daily. 08/18/20   Cathie Hoops, Candice Lunney V, PA-C  DULoxetine (CYMBALTA) 60 MG capsule Take 1 capsule (60 mg total) by mouth daily. 05/30/20   Hoy Register, MD  fluticasone (FLONASE) 50 MCG/ACT nasal spray Place 2 sprays into both nostrils daily. 01/18/20   Cathie Hoops, Muaaz Brau V,  PA-C  fluticasone furoate-vilanterol (BREO ELLIPTA) 100-25 MCG/INH AEPB Inhale 1 puff into the lungs daily. 05/30/20   Hoy Register, MD  gabapentin (NEURONTIN) 300 MG capsule Take 2 capsules (600 mg total) by mouth 2 (two) times daily. 05/30/20   Hoy Register, MD  hydrOXYzine (ATARAX/VISTARIL) 25 MG tablet Take 1 tablet (25 mg total) by mouth every 8 (eight) hours as needed. 05/30/20   Hoy Register, MD  lactulose (CHRONULAC) 10 GM/15ML solution Take 15 mLs (10 g total) by mouth 2 (two) times daily as needed for mild constipation. 06/23/17   Hoy Register, MD  meclizine (ANTIVERT) 25 MG tablet Take 1 tablet (25 mg total) by mouth 3 (three) times daily as needed for dizziness. 11/29/19   Hoy Register, MD  mirtazapine (REMERON) 30 MG tablet Take 1 tablet (30 mg total) by mouth at bedtime. 07/05/19   Hoy Register, MD  montelukast (SINGULAIR) 10 MG tablet Take 10 mg by mouth daily.     [provider]  Multiple Vitamin (MULTIVITAMIN WITH MINERALS) TABS Take 1 tablet by mouth daily.    [provider]  naproxen (NAPROSYN) 500 MG tablet Take 1 tablet (500 mg total) by mouth 2 (two) times daily. 05/31/20   Hall-Potvin, [place], PA-C  pantoprazole (PROTONIX) 40 MG tablet Take 1 tablet (40 mg total) by mouth daily. 05/09/20 06/08/20  Hoy Register, MD  polyethylene glycol powder (GLYCOLAX/MIRALAX) powder Take 17 g by mouth daily. 06/09/17   Hoy Register, MD  predniSONE (DELTASONE) 50 MG tablet Take 1 tablet (50 mg total) by mouth daily with breakfast. 08/18/20   Cathie Hoops, Kip Kautzman V, PA-C  triamcinolone cream (KENALOG) 0.1 % APPLY 1 APPLICATION TOPICALLY 2 (TWO) TIMES DAILY. 04/04/20   Hoy Register, MD    Family History Family History  Problem Relation Age of Onset  . Cancer - Other Mother   . CAD Father        Died age 69  . Hypertension Sister     Social History Social History   Tobacco Use  . Smoking status: Current Every Day Smoker    Packs/day: 0.25    Years: 20.00     Pack years: 5.00    Types: Cigarettes  . Smokeless tobacco: Never Used  Vaping Use  . Vaping Use: Never used  Substance Use Topics  . Alcohol use: No  . Drug use: No     Allergies   Patient has no known allergies.   Review of Systems Review of Systems   Physical Exam Triage Vital Signs ED Triage Vitals [08/18/20 1555]  Enc Vitals Group     BP 132/80  Pulse Rate 82     Resp 20     Temp 98 F (36.7 C)     Temp Source Oral     SpO2 97 %     Weight      Height      Head Circumference      Peak Flow      Pain Score      Pain Loc      Pain Edu?      Excl. in GC?    No data found.  Updated Vital Signs BP 132/80 (BP Location: Left Arm)   Pulse 82   Temp 98 F (36.7 C) (Oral)   Resp 20   LMP 03/17/2012   SpO2 97%   Physical Exam Constitutional:      General: She is not in acute distress.    Appearance: Normal appearance. She is well-developed. She is not ill-appearing, toxic-appearing or diaphoretic.  HENT:     Head: Normocephalic and atraumatic.     Right Ear: Tympanic membrane, ear canal and external ear normal. Tympanic membrane is not erythematous or bulging.     Left Ear: Tympanic membrane, ear canal and external ear normal. Tympanic membrane is not erythematous or bulging.     Nose:     Right Sinus: Maxillary sinus tenderness and frontal sinus tenderness present.     Left Sinus: Maxillary sinus tenderness and frontal sinus tenderness present.     Mouth/Throat:     Mouth: Mucous membranes are moist.     Pharynx: Oropharynx is clear. Uvula midline.  Eyes:     Conjunctiva/sclera: Conjunctivae normal.     Pupils: Pupils are equal, round, and reactive to light.  Cardiovascular:     Rate and Rhythm: Normal rate and regular rhythm.  Pulmonary:     Effort: Pulmonary effort is normal. No accessory muscle usage, prolonged expiration, respiratory distress or retractions.     Breath sounds: No decreased air movement or transmitted upper airway sounds. No  decreased breath sounds.     Comments: LCTAB Musculoskeletal:     Cervical back: Normal range of motion and neck supple.  Skin:    General: Skin is warm and dry.  Neurological:     Mental Status: She is alert and oriented to person, place, and time.      UC Treatments / Results  Labs (all labs ordered are listed, but only abnormal results are displayed) Labs Reviewed - No data to display  EKG   Radiology No results found.  Procedures Procedures (including critical care time)  Medications Ordered in UC Medications - No data to display  Initial Impression / Assessment and Plan / UC Course  I have reviewed the triage vital signs and the nursing notes.  Pertinent labs & imaging results that were available during my care of the patient were reviewed by me and considered in my medical decision making (see chart for details).    2-week history of URI symptoms.  Continued cough, postnasal drainage.  Lungs clear to auscultation bilaterally without adventitious lung sounds.  Will trial symptomatic management with prednisone and nasal sprays.  Rx of doxycycline provided, if symptoms do not improving, can start to cover for bacterial sinusitis.  Return precautions given.  Patient expresses understanding and agrees to plan.  Final Clinical Impressions(s) / UC Diagnoses   Final diagnoses:  Acute non-recurrent pansinusitis    ED Prescriptions    Medication Sig Dispense Auth. Provider   predniSONE (DELTASONE) 50 MG tablet Take 1  tablet (50 mg total) by mouth daily with breakfast. 5 tablet Zelpha Messing V, PA-C   benzonatate (TESSALON) 200 MG capsule Take 1 capsule (200 mg total) by mouth every 8 (eight) hours. 21 capsule Cathie HoopsYu, Kaydon Husby V, PA-C   doxycycline (VIBRAMYCIN) 100 MG capsule Take 1 capsule (100 mg total) by mouth 2 (two) times daily. 14 capsule Belinda FisherYu, Taurus Willis V, PA-C     PDMP not reviewed this encounter.   Belinda FisherYu, Jrake Rodriquez V, PA-C 08/19/20 [phone]

## 2020-08-18 NOTE — Discharge Instructions (Addendum)
Start prednisone as directed. Tessalon for cough. Continue azelastine and flonase as directed. If symptoms not improving, add doxycycline as directed. Follow up with PCP if symptoms not improving. If having shortness of breath, chest pain, go to the emergency department for further evaluation.

## 2020-09-03 ENCOUNTER — Other Ambulatory Visit: Payer: Self-pay | Admitting: Family Medicine

## 2020-09-03 MED FILL — PANTOPRAZOLE SOD DR 40 MG T: 40 | 30 days supply | Qty: 30 | Fill #3

## 2020-09-03 MED FILL — ?DULOXetine HCL 60MG CAPS: 60 | 30 days supply | Qty: 30 | Fill #0

## 2020-09-12 ENCOUNTER — Other Ambulatory Visit: Payer: Self-pay

## 2020-09-12 ENCOUNTER — Ambulatory Visit
Admission: RE | Admit: 2020-09-12 | Discharge: 2020-09-12 | Disposition: A | Discharge status: Home / Self Care | Payer: Medicare Other | Source: Ambulatory Visit | Attending: Family Medicine | Admitting: Family Medicine

## 2020-09-12 DIAGNOSIS — Z1231 Encounter for screening mammogram for malignant neoplasm of breast: Secondary | ICD-10-CM | POA: Diagnosis not present

## 2020-09-12 DIAGNOSIS — E2839 Other primary ovarian failure: Secondary | ICD-10-CM

## 2020-09-12 DIAGNOSIS — M8580 Other specified disorders of bone density and structure, unspecified site: Secondary | ICD-10-CM | POA: Diagnosis not present

## 2020-09-13 ENCOUNTER — Telehealth: Payer: Self-pay

## 2020-09-13 NOTE — Telephone Encounter (Signed)
-----   Message from Hoy Register, MD sent at 09/13/2020  9:10 AM EST ----- Bone mineral density test is normal

## 2020-09-13 NOTE — Telephone Encounter (Signed)
Pt was called and informed of all results.

## 2020-09-13 NOTE — Telephone Encounter (Signed)
-----   Message from Hoy Register, MD sent at 09/13/2020  9:10 AM EST ----- Mammogram is negative for malignant

## 2020-10-02 MED FILL — !BREO ELLIPTA 100-25 MCG IN: 100-25 | 30 days supply | Qty: 60 | Fill #2

## 2020-10-02 MED FILL — AMLODIPINE BESYLATE 10 MG T: 10 | 30 days supply | Qty: 30 | Fill #1

## 2020-10-02 MED FILL — PANTOPRAZOLE SOD DR 40 MG T: 40 | 30 days supply | Qty: 30 | Fill #4

## 2020-10-02 MED FILL — ?CARVEDILOL 3.125 MG TABLET: 3.125 | 30 days supply | Qty: 60 | Fill #1

## 2020-10-02 MED FILL — !VENTOLIN HFA INHALER: 108 (90 BAS | 25 days supply | Qty: 18 | Fill #2

## 2020-10-16 ENCOUNTER — Other Ambulatory Visit: Payer: Self-pay | Admitting: Emergency Medicine

## 2020-10-16 ENCOUNTER — Ambulatory Visit
Admission: EM | Admit: 2020-10-16 | Discharge: 2020-10-16 | Disposition: A | Discharge status: Home / Self Care | Payer: Medicare Other | Attending: Emergency Medicine | Admitting: Emergency Medicine

## 2020-10-16 ENCOUNTER — Other Ambulatory Visit: Payer: Self-pay

## 2020-10-16 DIAGNOSIS — Z20822 Contact with and (suspected) exposure to covid-19: Secondary | ICD-10-CM

## 2020-10-16 DIAGNOSIS — J22 Unspecified acute lower respiratory infection: Secondary | ICD-10-CM

## 2020-10-16 MED ORDER — BENZONATATE 200 MG PO CAPS: 200.0000 mg | ORAL_CAPSULE | Freq: Three times a day (TID) | ORAL | 0 refills | Status: AC | PRN

## 2020-10-16 MED ORDER — PREDNISONE 10 MG PO TABS: ORAL_TABLET | ORAL | 0 refills | Status: DC

## 2020-10-16 MED ORDER — DOXYCYCLINE HYCLATE 100 MG PO CAPS: 100.0000 mg | ORAL_CAPSULE | Freq: Two times a day (BID) | ORAL | 0 refills | Status: AC

## 2020-10-16 MED ORDER — DM-GUAIFENESIN ER 30-600 MG PO TB12: 1.0000 | ORAL_TABLET | Freq: Two times a day (BID) | ORAL | 0 refills | Status: DC

## 2020-10-16 NOTE — ED Provider Notes (Signed)
[name]    CSN: [id number] Arrival date & time: 10/16/20  1619      History   Chief Complaint Chief Complaint  Patient presents with   Cough    HPI Tammy Schwartz is a 65 y.o. female presenting today for evaluation of URI symptoms and nausea.  Reports symptoms initially began with nausea and diarrhea after drinking a cup of coffee, diarrhea is resolved along with nausea, but continues to have decreased appetite.  She has had cough and congestion over the past week.  Feels as if her taste is off.  Cough is productive.  Denies known Covid exposures.  History of tobacco use.    HPI  Past Medical History:  Diagnosis Date   Chronic back pain    HNp,spondylosis,radiculopathy   COPD (chronic obstructive pulmonary disease) (HCC)    Hyperlipidemia    was on choloesterol meds last yr-samples given in office but not needed since   Hypertension    takes Exforge daily   Joint pain    Shortness of breath    takes Singulair daily;with exertion   Sickle cell trait (HCC)    Spondylolysis    Tobacco use     Patient Active Problem List   Diagnosis Date Noted   Screening breast examination 07/26/2019   Cellulitis 05/17/2018   Lumbar radiculopathy 02/27/2017   Poor dentition 02/11/2017   Acute bronchitis    Chest pain 05/07/2016   Chest pain at rest 05/07/2016   Essential hypertension    Hyperlipidemia    Tobacco use    Spondylolysis    Chronic obstructive pulmonary disease (HCC)    Lumbar spondylosis 01/20/2014   HNP (herniated nucleus pulposus), lumbar 06/04/2012    Past Surgical History:  Procedure Laterality Date   BACK SURGERY     BREAST EXCISIONAL BIOPSY Left 2007   benign   BREAST SURGERY Left    lumpectomy   COLONOSCOPY     DILATION AND CURETTAGE OF UTERUS     LUMBAR LAMINECTOMY/DECOMPRESSION MICRODISCECTOMY  06/04/2012   Procedure: LUMBAR LAMINECTOMY/DECOMPRESSION MICRODISCECTOMY 1 LEVEL;  Surgeon: Carmela Hurt, MD;  Location: [facility];  Service: Neurosurgery;  Laterality: Left;  LEFT Lumbar five-sacral one diskectomy   LUMBAR LAMINECTOMY/DECOMPRESSION MICRODISCECTOMY Right 06/16/2013   Procedure: RIGHT Lumbar Five-Sacral One Microdiskectomy;  Surgeon: Carmela Hurt, MD;  Location: [facility];  Service: Neurosurgery;  Laterality: Right;  RIGHT Lumbar Five-Sacral One Microdiskectomy   LUMBAR LAMINECTOMY/DECOMPRESSION MICRODISCECTOMY Right 01/20/2014   Procedure: LUMBAR FOUR TO LUMBAR FIVE LUMBAR LAMINECTOMY/DECOMPRESSION MICRODISCECTOMY 1 LEVEL;  Surgeon: Carmela Hurt, MD;  Location: [facility];  Service: Neurosurgery;  Laterality: Right;  Right L45 laminectomy and foramintomy    OB History    Gravida  2   Para      Term      Preterm      AB      Living  2     SAB      IAB      Ectopic      Multiple      Live Births  2            Home Medications    Prior to Admission medications   Medication Sig Start Date End Date Taking? Authorizing Provider  benzonatate (TESSALON) 200 MG capsule Take 1 capsule (200 mg total) by mouth 3 (three) times daily as needed for up to 7 days for cough. 10/16/20 10/23/20 Yes Alaisa Moffitt C, PA-C  dextromethorphan-guaiFENesin (MUCINEX DM) 30-600 MG 12hr tablet Take 1 tablet by mouth 2 (two) times daily. 10/16/20  Yes Coron Rossano C, PA-C  doxycycline (VIBRAMYCIN) 100 MG capsule Take 1 capsule (100 mg total) by mouth 2 (two) times daily for 7 days. 10/16/20 10/23/20 Yes Jalonda Antigua C, PA-C  predniSONE (DELTASONE) 10 MG tablet Begin with 6 tabs on day 1, 5 tab on day 2, 4 tab on day 3, 3 tab on day 4, 2 tab on day 5, 1 tab on day 6-take with food 10/16/20  Yes Jahel Wavra C, PA-C  albuterol (VENTOLIN HFA) 108 (90 Base) MCG/ACT inhaler INHALE 2 PUFFS INTO THE LUNGS EVERY 6 HOURS AS NEEDED 08/06/20   Ladell Pier, MD  amLODipine (NORVASC) 10 MG tablet Take 1 tablet (10 mg total) by mouth daily. 05/30/20   Charlott Rakes, MD   aspirin EC 81 MG tablet Take 162 mg by mouth daily.    [provider]  azelastine (ASTELIN) 0.1 % nasal spray Place 2 sprays into both nostrils 2 (two) times daily. 01/18/20   Tasia Catchings, Amy V, PA-C  carvedilol (COREG) 3.125 MG tablet Take 1 tablet (3.125 mg total) by mouth 2 (two) times daily with a meal. 05/30/20   Charlott Rakes, MD  cetirizine (ZYRTEC) 10 MG tablet TAKE 1 TABLET BY MOUTH DAILY. 06/08/19   Charlott Rakes, MD  DULoxetine (CYMBALTA) 60 MG capsule Take 1 capsule (60 mg total) by mouth daily. 05/30/20   Charlott Rakes, MD  fluticasone furoate-vilanterol (BREO ELLIPTA) 100-25 MCG/INH AEPB Inhale 1 puff into the lungs daily. 05/30/20   Charlott Rakes, MD  gabapentin (NEURONTIN) 300 MG capsule Take 2 capsules (600 mg total) by mouth 2 (two) times daily. 05/30/20   Charlott Rakes, MD  hydrOXYzine (ATARAX/VISTARIL) 25 MG tablet Take 1 tablet (25 mg total) by mouth every 8 (eight) hours as needed. 05/30/20   Charlott Rakes, MD  lactulose (CHRONULAC) 10 GM/15ML solution Take 15 mLs (10 g total) by mouth 2 (two) times daily as needed for mild constipation. 06/23/17   Charlott Rakes, MD  meclizine (ANTIVERT) 25 MG tablet Take 1 tablet (25 mg total) by mouth 3 (three) times daily as needed for dizziness. 11/29/19   Charlott Rakes, MD  mirtazapine (REMERON) 30 MG tablet Take 1 tablet (30 mg total) by mouth at bedtime. 07/05/19   Charlott Rakes, MD  montelukast (SINGULAIR) 10 MG tablet Take 10 mg by mouth daily.     [provider]  Multiple Vitamin (MULTIVITAMIN WITH MINERALS) TABS Take 1 tablet by mouth daily.    [provider]  naproxen (NAPROSYN) 500 MG tablet Take 1 tablet (500 mg total) by mouth 2 (two) times daily. 05/31/20   Hall-Potvin, [place], PA-C  pantoprazole (PROTONIX) 40 MG tablet Take 1 tablet (40 mg total) by mouth daily. 05/09/20 06/08/20  Charlott Rakes, MD  polyethylene glycol powder (GLYCOLAX/MIRALAX) powder Take 17 g by mouth daily. 06/09/17   Charlott Rakes, MD  triamcinolone cream (KENALOG) 0.1 % APPLY 1 APPLICATION TOPICALLY 2 (TWO) TIMES DAILY. 04/04/20   Charlott Rakes, MD    Family History Family History  Problem Relation Age of Onset   Cancer - Other Mother    CAD Father        Died age 12   Hypertension Sister     Social History Social History   Tobacco Use   Smoking status: Current Every Day Smoker    Packs/day: 0.25    Years: 20.00    Pack years:  5.00    Types: Cigarettes   Smokeless tobacco: Never Used  Vaping Use   Vaping Use: Never used  Substance Use Topics   Alcohol use: No   Drug use: No     Allergies   Patient has no known allergies.   Review of Systems Review of Systems  Constitutional: Positive for appetite change. Negative for activity change, chills, fatigue and fever.  HENT: Positive for congestion, rhinorrhea, sinus pressure and sore throat. Negative for ear pain and trouble swallowing.   Eyes: Negative for discharge and redness.  Respiratory: Positive for cough. Negative for chest tightness and shortness of breath.   Cardiovascular: Negative for chest pain.  Gastrointestinal: Positive for nausea. Negative for abdominal pain, diarrhea and vomiting.  Musculoskeletal: Negative for myalgias.  Skin: Negative for rash.  Neurological: Negative for dizziness, light-headedness and headaches.     Physical Exam Triage Vital Signs ED Triage Vitals [10/16/20 1859]  Enc Vitals Group     BP (!) 165/80     Pulse Rate 64     Resp 18     Temp 99.4 F (37.4 C)     Temp Source Oral     SpO2 94 %     Weight      Height      Head Circumference      Peak Flow      Pain Score 10     Pain Loc      Pain Edu?      Excl. in GC?    No data found.  Updated Vital Signs BP (!) 165/80 (BP Location: Left Arm)    Pulse 64    Temp 99.4 F (37.4 C) (Oral)    Resp 18    LMP 03/17/2012    SpO2 94%   Visual Acuity Right Eye Distance:   Left Eye Distance:   Bilateral Distance:    Right Eye  Near:   Left Eye Near:    Bilateral Near:     Physical Exam Vitals and nursing note reviewed.  Constitutional:      Appearance: She is well-developed and well-nourished.     Comments: No acute distress  HENT:     Head: Normocephalic and atraumatic.     Ears:     Comments: Bilateral ears without tenderness to palpation of external auricle, tragus and mastoid, EAC's without erythema or swelling, TM's with good bony landmarks and cone of light. Non erythematous.     Nose: Nose normal.     Mouth/Throat:     Comments: Oral mucosa pink and moist, no tonsillar enlargement or exudate. Posterior pharynx patent and nonerythematous, no uvula deviation or swelling. Normal phonation.  Eyes:     Conjunctiva/sclera: Conjunctivae normal.  Cardiovascular:     Rate and Rhythm: Normal rate.  Pulmonary:     Effort: Pulmonary effort is normal. No respiratory distress.     Comments: Breathing comfortably at rest, CTABL, no wheezing, rales or other adventitious sounds auscultated  Abdominal:     General: There is no distension.  Musculoskeletal:        General: Normal range of motion.     Cervical back: Neck supple.  Skin:    General: Skin is warm and dry.  Neurological:     Mental Status: She is alert and oriented to person, place, and time.  Psychiatric:        Mood and Affect: Mood and affect normal.      UC Treatments / Results  Labs (all  labs ordered are listed, but only abnormal results are displayed) Labs Reviewed  NOVEL CORONAVIRUS, NAA    EKG   Radiology No results found.  Procedures Procedures (including critical care time)  Medications Ordered in UC Medications - No data to display  Initial Impression / Assessment and Plan / UC Course  I have reviewed the triage vital signs and the nursing notes.  Pertinent labs & imaging results that were available during my care of the patient were reviewed by me and considered in my medical decision making (see chart for  details).    Covid test pending.  Treating for lower respiratory infection/bronchitis with doxycycline, prednisone Tessalon and Mucinex DM.  Continue to rest and fluids.  Discussed strict return precautions. Patient verbalized understanding and is agreeable with plan.   Final Clinical Impressions(s) / UC Diagnoses   Final diagnoses:  Encounter for screening laboratory testing for COVID-19 virus  Lower respiratory infection (e.g., bronchitis, pneumonia, pneumonitis, pulmonitis)     Discharge Instructions     Covid test pending, monitor my chart for results Begin doxycycline twice daily for 1 week Prednisone taper daily over the next 6 days with food Tessalon every 8 hours for cough Mucinex DM to further relieve cough and congestion Rest and fluids Tylenol and ibuprofen as needed for fevers body aches headaches Follow-up if not improving or worsening    ED Prescriptions    Medication Sig Dispense Auth. Provider   benzonatate (TESSALON) 200 MG capsule Take 1 capsule (200 mg total) by mouth 3 (three) times daily as needed for up to 7 days for cough. 28 capsule Rosabella Edgin C, PA-C   dextromethorphan-guaiFENesin (MUCINEX DM) 30-600 MG 12hr tablet Take 1 tablet by mouth 2 (two) times daily. 20 tablet Devona Holmes C, PA-C   doxycycline (VIBRAMYCIN) 100 MG capsule Take 1 capsule (100 mg total) by mouth 2 (two) times daily for 7 days. 14 capsule Junior Huezo C, PA-C   predniSONE (DELTASONE) 10 MG tablet Begin with 6 tabs on day 1, 5 tab on day 2, 4 tab on day 3, 3 tab on day 4, 2 tab on day 5, 1 tab on day 6-take with food 21 tablet Kobe Ofallon C, PA-C     PDMP not reviewed this encounter.   Lew Dawes, PA-C 10/17/20 1012

## 2020-10-16 NOTE — Discharge Instructions (Addendum)
Covid test pending, monitor my chart for results Begin doxycycline twice daily for 1 week Prednisone taper daily over the next 6 days with food Tessalon every 8 hours for cough Mucinex DM to further relieve cough and congestion Rest and fluids Tylenol and ibuprofen as needed for fevers body aches headaches Follow-up if not improving or worsening

## 2020-10-16 NOTE — ED Triage Notes (Signed)
Pt c/o cough since the week of christmas. Pt c/o body aches, runny nose, and no appetite x1wk. States has been using inhaler more then normal.

## 2020-10-17 MED FILL — ?BENZONATATE 200 MG CAPS: 200 | 7 days supply | Qty: 28 | Fill #0

## 2020-10-17 MED FILL — ?DOXYCYCLINE HYCLATE 100 MG: 100 | 7 days supply | Qty: 14 | Fill #0

## 2020-10-17 MED FILL — predniSONE 10 MG TABS: 10 | 6 days supply | Qty: 21 | Fill #0

## 2020-10-19 LAB — NOVEL CORONAVIRUS, NAA: SARS-CoV-2, NAA: NOT DETECTED

## 2020-11-09 ENCOUNTER — Other Ambulatory Visit: Payer: Self-pay | Admitting: Family Medicine

## 2020-11-09 DIAGNOSIS — L299 Pruritus, unspecified: Secondary | ICD-10-CM

## 2020-11-09 MED FILL — PANTOPRAZOLE SOD DR 40 MG T: 40 | 30 days supply | Qty: 30 | Fill #5

## 2020-11-09 MED FILL — ?CETIRIZINE HCL 10 MG TABLE: 10 | 30 days supply | Qty: 30 | Fill #0

## 2020-11-13 MED FILL — $BREO ELLIPTA 100-25 MCG IH: 100-25 MCG | 30 days supply | Qty: 60 | Fill #3

## 2020-12-04 DIAGNOSIS — Z23 Encounter for immunization: Secondary | ICD-10-CM | POA: Diagnosis not present

## 2020-12-05 ENCOUNTER — Encounter: Payer: Self-pay | Admitting: Family Medicine

## 2020-12-05 ENCOUNTER — Other Ambulatory Visit: Payer: Self-pay | Admitting: Family Medicine

## 2020-12-05 ENCOUNTER — Ambulatory Visit: Payer: Medicare Other | Attending: Family Medicine | Admitting: Family Medicine

## 2020-12-05 ENCOUNTER — Other Ambulatory Visit: Payer: Self-pay

## 2020-12-05 VITALS — BP 102/53 | HR 69 | Ht 61.0 in | Wt 108.0 lb

## 2020-12-05 DIAGNOSIS — G4709 Other insomnia: Secondary | ICD-10-CM

## 2020-12-05 DIAGNOSIS — Z23 Encounter for immunization: Secondary | ICD-10-CM

## 2020-12-05 DIAGNOSIS — M5416 Radiculopathy, lumbar region: Secondary | ICD-10-CM

## 2020-12-05 DIAGNOSIS — I10 Essential (primary) hypertension: Secondary | ICD-10-CM

## 2020-12-05 DIAGNOSIS — J328 Other chronic sinusitis: Secondary | ICD-10-CM | POA: Diagnosis not present

## 2020-12-05 MED ORDER — MIRTAZAPINE 30 MG PO TABS: 30.0000 mg | ORAL_TABLET | Freq: Every day | ORAL | 1 refills | Status: DC

## 2020-12-05 MED ORDER — PREDNISONE 20 MG PO TABS: 20.0000 mg | ORAL_TABLET | Freq: Every day | ORAL | 0 refills | Status: DC

## 2020-12-05 MED ORDER — BENZONATATE 100 MG PO CAPS: 100.0000 mg | ORAL_CAPSULE | Freq: Two times a day (BID) | ORAL | 0 refills | Status: DC | PRN

## 2020-12-05 MED ORDER — AMLODIPINE BESYLATE 10 MG PO TABS: 10.0000 mg | ORAL_TABLET | Freq: Every day | ORAL | 1 refills | Status: DC

## 2020-12-05 MED ORDER — DULOXETINE HCL 60 MG PO CPEP: 60.0000 mg | ORAL_CAPSULE | Freq: Every day | ORAL | 1 refills | Status: DC

## 2020-12-05 MED ORDER — CARVEDILOL 3.125 MG PO TABS: 3.1250 mg | ORAL_TABLET | Freq: Two times a day (BID) | ORAL | 1 refills | Status: DC

## 2020-12-05 MED FILL — CARVEDILOL 3.125 MG TABLET: 3.125 | 30 days supply | Qty: 60 | Fill #0

## 2020-12-05 MED FILL — DULoxetine HCL 60 MG CPEP: 60 | 30 days supply | Qty: 30 | Fill #0

## 2020-12-05 MED FILL — predniSONE 20 MG TABS: 20 | 5 days supply | Qty: 5 | Fill #0

## 2020-12-05 MED FILL — MIRTAZAPINE 30 MG TABLET: 30 | 30 days supply | Qty: 30 | Fill #0

## 2020-12-05 MED FILL — AMLODIPINE BESYLATE 10 MG T: 10 | 30 days supply | Qty: 30 | Fill #0

## 2020-12-05 MED FILL — BENZONATATE 100 MG CAPS: 100 | 10 days supply | Qty: 20 | Fill #0

## 2020-12-05 NOTE — Progress Notes (Signed)
Subjective:  Patient ID: Tammy Schwartz, female    DOB: 04/26/1956  Age: 65 y.o. MRN: [id number]  CC: Cough   HPI Tammy Schwartz  is a 65 year old female with history of hypertension, tobacco abuse, COPD, lumbar spondylosis status post back surgery who presents todayfor follow-up visit. For the last 6 weeks she has had a cough, post nasal drip, rhinorrhea. Cough is productive of whitish phlegm. Treated with Doxy and Prednisone at an urgent care visit in 10/16/2020 but symptoms never improved.  Denies presence of fever, myalgias.  She is compliant with her antihypertensive and her inhalers for her COPD.  She continues to smoke. Her back pain is stable.  Past Medical History:  Diagnosis Date  . Chronic back pain    HNp,spondylosis,radiculopathy  . COPD (chronic obstructive pulmonary disease) (HCC)   . Hyperlipidemia    was on choloesterol meds last yr-samples given in office but not needed since  . Hypertension    takes Exforge daily  . Joint pain   . Shortness of breath    takes Singulair daily;with exertion  . Sickle cell trait (HCC)   . Spondylolysis   . Tobacco use     Past Surgical History:  Procedure Laterality Date  . BACK SURGERY    . BREAST EXCISIONAL BIOPSY Left 2007   benign  . BREAST SURGERY Left    lumpectomy  . COLONOSCOPY    . DILATION AND CURETTAGE OF UTERUS    . LUMBAR LAMINECTOMY/DECOMPRESSION MICRODISCECTOMY  06/04/2012   Procedure: LUMBAR LAMINECTOMY/DECOMPRESSION MICRODISCECTOMY 1 LEVEL;  Surgeon: Carmela HurtKyle L Cabbell, MD;  Location: [facility];  Service: Neurosurgery;  Laterality: Left;  LEFT Lumbar five-sacral one diskectomy  . LUMBAR LAMINECTOMY/DECOMPRESSION MICRODISCECTOMY Right 06/16/2013   Procedure: RIGHT Lumbar Five-Sacral One Microdiskectomy;  Surgeon: Carmela HurtKyle L Cabbell, MD;  Location: [facility];  Service: Neurosurgery;  Laterality: Right;  RIGHT Lumbar Five-Sacral One Microdiskectomy  . LUMBAR LAMINECTOMY/DECOMPRESSION  MICRODISCECTOMY Right 01/20/2014   Procedure: LUMBAR FOUR TO LUMBAR FIVE LUMBAR LAMINECTOMY/DECOMPRESSION MICRODISCECTOMY 1 LEVEL;  Surgeon: Carmela HurtKyle L Cabbell, MD;  Location: [facility];  Service: Neurosurgery;  Laterality: Right;  Right L45 laminectomy and foramintomy    Family History  Problem Relation Age of Onset  . Cancer - Other Mother   . CAD Father        Died age 11088  . Hypertension Sister     No Known Allergies  Outpatient Medications Prior to Visit  Medication Sig Dispense Refill  . albuterol (VENTOLIN HFA) 108 (90 Base) MCG/ACT inhaler INHALE 2 PUFFS INTO THE LUNGS EVERY 6 HOURS AS NEEDED 36 g 2  . amLODipine (NORVASC) 10 MG tablet Take 1 tablet (10 mg total) by mouth daily. 90 tablet 1  . aspirin EC 81 MG tablet Take 162 mg by mouth daily.    Marland Kitchen. azelastine (ASTELIN) 0.1 % nasal spray Place 2 sprays into both nostrils 2 (two) times daily. 30 mL 0  . carvedilol (COREG) 3.125 MG tablet Take 1 tablet (3.125 mg total) by mouth 2 (two) times daily with a meal. 180 tablet 1  . cetirizine (ZYRTEC) 10 MG tablet TAKE 1 TABLET BY MOUTH DAILY. 30 tablet 2  . DULoxetine (CYMBALTA) 60 MG capsule Take 1 capsule (60 mg total) by mouth daily. 90 capsule 1  . fluticasone furoate-vilanterol (BREO ELLIPTA) 100-25 MCG/INH AEPB Inhale 1 puff into the lungs daily. 60 each 6  . gabapentin (NEURONTIN) 300 MG capsule Take 2 capsules (600 mg total) by mouth 2 (  two) times daily. 120 capsule 3  . hydrOXYzine (ATARAX/VISTARIL) 25 MG tablet Take 1 tablet (25 mg total) by mouth every 8 (eight) hours as needed. 90 tablet 2  . lactulose (CHRONULAC) 10 GM/15ML solution Take 15 mLs (10 g total) by mouth 2 (two) times daily as needed for mild constipation. 946 mL 0  . meclizine (ANTIVERT) 25 MG tablet Take 1 tablet (25 mg total) by mouth 3 (three) times daily as needed for dizziness. 60 tablet 1  . mirtazapine (REMERON) 30 MG tablet Take 1 tablet (30 mg total) by mouth at bedtime. 30 tablet 6  . montelukast  (SINGULAIR) 10 MG tablet Take 10 mg by mouth daily.     . Multiple Vitamin (MULTIVITAMIN WITH MINERALS) TABS Take 1 tablet by mouth daily.    . naproxen (NAPROSYN) 500 MG tablet Take 1 tablet (500 mg total) by mouth 2 (two) times daily. 30 tablet 0  . polyethylene glycol powder (GLYCOLAX/MIRALAX) powder Take 17 g by mouth daily. 3350 g 1  . triamcinolone cream (KENALOG) 0.1 % APPLY 1 APPLICATION TOPICALLY 2 (TWO) TIMES DAILY. 80 g 0  . dextromethorphan-guaiFENesin (MUCINEX DM) 30-600 MG 12hr tablet Take 1 tablet by mouth 2 (two) times daily. (Patient not taking: Reported on 12/05/2020) 20 tablet 0  . pantoprazole (PROTONIX) 40 MG tablet Take 1 tablet (40 mg total) by mouth daily. 30 tablet 6  . predniSONE (DELTASONE) 10 MG tablet Begin with 6 tabs on day 1, 5 tab on day 2, 4 tab on day 3, 3 tab on day 4, 2 tab on day 5, 1 tab on day 6-take with food (Patient not taking: Reported on 12/05/2020) 21 tablet 0   No facility-administered medications prior to visit.     ROS Review of Systems  Constitutional: Negative for activity change, appetite change and fatigue.  HENT: Positive for postnasal drip and rhinorrhea. Negative for congestion, sinus pressure, sinus pain and sore throat.   Eyes: Negative for visual disturbance.  Respiratory: Positive for cough. Negative for chest tightness, shortness of breath and wheezing.   Cardiovascular: Negative for chest pain and palpitations.  Gastrointestinal: Negative for abdominal distention, abdominal pain and constipation.  Endocrine: Negative for polydipsia.  Genitourinary: Negative for dysuria and frequency.  Musculoskeletal: Negative for arthralgias and back pain.  Skin: Negative for rash.  Neurological: Negative for tremors, light-headedness and numbness.  Hematological: Does not bruise/bleed easily.  Psychiatric/Behavioral: Negative for agitation and behavioral problems.    Objective:  BP (!) 102/53   Pulse 69   Ht 5\' 1"  (1.549 m)   Wt 108 lb  (49 kg)   LMP 03/17/2012   SpO2 98%   BMI 20.41 kg/m   BP/Weight 12/05/2020 10/16/2020 08/18/2020  Systolic BP 102 165 132  Diastolic BP 53 80 80  Wt. (Lbs) 108 - -  BMI 20.41 - -      Physical Exam Constitutional:      Appearance: She is well-developed.  HENT:     Right Ear: Tympanic membrane normal.     Left Ear: Tympanic membrane normal.     Mouth/Throat:     Mouth: Mucous membranes are moist.  Neck:     Vascular: No JVD.  Cardiovascular:     Rate and Rhythm: Normal rate.     Heart sounds: Normal heart sounds. No murmur heard.   Pulmonary:     Effort: Pulmonary effort is normal.     Breath sounds: Normal breath sounds. No wheezing or rales.  Chest:  Chest wall: No tenderness.  Abdominal:     General: Bowel sounds are normal. There is no distension.     Palpations: Abdomen is soft. There is no mass.     Tenderness: There is no abdominal tenderness.  Musculoskeletal:        General: Normal range of motion.     Right lower leg: No edema.     Left lower leg: No edema.  Neurological:     Mental Status: She is alert and oriented to person, place, and time.  Psychiatric:        Mood and Affect: Mood normal.     CMP Latest Ref Rng & Units 02/23/2020 11/29/2019 08/26/2018  Glucose 70 - 99 mg/dL 85 93 80  BUN 8 - 23 mg/dL 5(L) 7(L) 5(L)  Creatinine 0.44 - 1.00 mg/dL 0.93 2.67 1.24  Sodium 135 - 145 mmol/L 143 143 144  Potassium 3.5 - 5.1 mmol/L 3.2(L) 4.3 3.5  Chloride 98 - 111 mmol/L 106 104 103  CO2 20 - 29 mmol/L - 23 22  Calcium 8.7 - 10.3 mg/dL - 9.5 9.5  Total Protein 6.5 - 8.1 g/dL - - -  Total Bilirubin 0.3 - 1.2 mg/dL - - -  Alkaline Phos 38 - 126 U/L - - -  AST 15 - 41 U/L - - -  ALT 0 - 44 U/L - - -    Lipid Panel     Component Value Date/Time   CHOL 204 (H) 01/01/2018 1223   TRIG 92 01/01/2018 1223   HDL 102 01/01/2018 1223   CHOLHDL 2.0 01/01/2018 1223   CHOLHDL 2.3 05/07/2016 0739   VLDL 20 05/07/2016 0739   LDLCALC 84 01/01/2018 1223     CBC    Component Value Date/Time   WBC 6.1 02/23/2020 1838   RBC 4.82 02/23/2020 1838   HGB 14.6 02/23/2020 1851   HGB 12.7 06/09/2017 1446   HCT 43.0 02/23/2020 1851   HCT 38.4 06/09/2017 1446   PLT 278 02/23/2020 1838   PLT 254 06/09/2017 1446   MCV 86.5 02/23/2020 1838   MCV 87 06/09/2017 1446   MCH 29.0 02/23/2020 1838   MCHC 33.6 02/23/2020 1838   RDW 15.6 (H) 02/23/2020 1838   RDW 15.8 (H) 06/09/2017 1446   LYMPHSABS 2.1 05/20/2018 0457   LYMPHSABS 2.4 06/09/2017 1446   MONOABS 0.4 05/20/2018 0457   EOSABS 0.3 05/20/2018 0457   EOSABS 0.2 06/09/2017 1446   BASOSABS 0.0 05/20/2018 0457   BASOSABS 0.0 06/09/2017 1446    No results found for: HGBA1C  Assessment & Plan:  1. Other insomnia Stable - mirtazapine (REMERON) 30 MG tablet; Take 1 tablet (30 mg total) by mouth at bedtime.  Dispense: 90 tablet; Refill: 1  2. Lumbar radiculopathy Controlled - DULoxetine (CYMBALTA) 60 MG capsule; Take 1 capsule (60 mg total) by mouth daily.  Dispense: 90 capsule; Refill: 1  3. Essential hypertension Controlled Counseled on blood pressure goal of less than 130/80, low-sodium, DASH diet, medication compliance, 150 minutes of moderate intensity exercise per week. Discussed medication compliance, adverse effects. - carvedilol (COREG) 3.125 MG tablet; Take 1 tablet (3.125 mg total) by mouth 2 (two) times daily with a meal.  Dispense: 180 tablet; Refill: 1 - amLODipine (NORVASC) 10 MG tablet; Take 1 tablet (10 mg total) by mouth daily.  Dispense: 90 tablet; Refill: 1  4. Other chronic sinusitis Completed course of doxycycline and prednisone Continue nasal spray We will place an additional round of prednisone We  will place on ENT schedule - benzonatate (TESSALON) 100 MG capsule; Take 1 capsule (100 mg total) by mouth 2 (two) times daily as needed for cough.  Dispense: 20 capsule; Refill: 0 - predniSONE (DELTASONE) 20 MG tablet; Take 1 tablet (20 mg total) by mouth daily with  breakfast.  Dispense: 5 tablet; Refill: 0    No orders of the defined types were placed in this encounter.  Return for Chronic sinusitis schedule with Dr Lazarus Salines; 6 months PCP - Hypertension.        Hoy Register, MD, FAAFP. [facility] [place], [place] [phone]   12/05/2020, 3:48 PM

## 2020-12-07 ENCOUNTER — Encounter: Payer: Self-pay | Admitting: Otolaryngology

## 2020-12-07 ENCOUNTER — Ambulatory Visit: Payer: Medicare Other | Attending: Otolaryngology | Admitting: Otolaryngology

## 2020-12-07 ENCOUNTER — Other Ambulatory Visit: Payer: Self-pay

## 2020-12-07 VITALS — BP 160/81 | HR 77 | Wt 108.8 lb

## 2020-12-07 DIAGNOSIS — J329 Chronic sinusitis, unspecified: Secondary | ICD-10-CM

## 2020-12-07 DIAGNOSIS — R059 Cough, unspecified: Secondary | ICD-10-CM

## 2020-12-07 DIAGNOSIS — H0520 Unspecified exophthalmos: Secondary | ICD-10-CM

## 2020-12-07 DIAGNOSIS — E010 Iodine-deficiency related diffuse (endemic) goiter: Secondary | ICD-10-CM | POA: Diagnosis not present

## 2020-12-07 DIAGNOSIS — J449 Chronic obstructive pulmonary disease, unspecified: Secondary | ICD-10-CM

## 2020-12-07 NOTE — Progress Notes (Unsigned)
Chief complaint: Runny nose, cough  History: 65 year old black female has had somewhat scattered care over the past several years.  A maxillofacial CT scan 2 years ago did not show any sinus disease.  CT scan of the orbits showed possible hypertrophy of the orbital fat.  No history of thyroid disease.  She has not had a chest x-ray for several years.  Here recently, she had white runny nose and cough.  Some increase in her wheezing.  She is on several inhalers.  She is not on an ACE inhibitor.  She takes her reflux medication regularly.  She continues to smoke 1/4-1/3 pack/day.  She received doxycycline and Tessalon without improvement.  She describes some right ear pain.  She has upper and lower plates.  She sleeps with them out.  She does not think she has any history of bruxism.  Examination: She is trim.  She appears somewhat proptotic.  Mental status may be slightly confused as to her medical care.  The head is atraumatic and neck supple.  Ears canals are clear with normal drums.  She has bilateral mild TMJ tenderness and crepitus.  Anterior nose is moist and patent.  Oral cavity reveals full upper and lower plates.  Oropharynx is clear.  Neck is thin without palpable adenopathy or thyromegaly.  Cranial nerves intact.  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  Possible subacute sinusitis.  Chronic cough of uncertain etiology.  Proptosis of uncertain etiology.  Plan: I will check a thyroid panel including TSH.  Thyroid ultrasound.  Limited sinus CT scan.  Chest x-ray.  We will call her with results.  She may need a pulmonologist for the cough if it persists.

## 2020-12-07 NOTE — Patient Instructions (Signed)
I am not sure if you actually have sinusitis, and not sure exactly why you have cough.  You do have somewhat prominent eyes, which could be normal for you, or could be related to thyroid disease.  I will check some thyroid blood test, and ultrasound, and a sinus scan.  Also a chest x-ray.  We will call you with your results.  If we do not find anything, a pulmonologist may be an appropriate next step.  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  This can be painful, but not necessarily dangerous.  See if you can sleep with your upper plate in and see if this makes any difference.  Avoid chewing gum, crunching on ice or popcorn kernels, etc.

## 2020-12-08 LAB — THYROID PANEL WITH TSH
Free Thyroxine Index: 1.2 (ref 1.2–4.9)
T3 Uptake Ratio: 24 % (ref 24–39)
T4, Total: 5.1 ug/dL (ref 4.5–12.0)
TSH: 0.687 u[IU]/mL (ref 0.450–4.500)

## 2020-12-20 ENCOUNTER — Ambulatory Visit (HOSPITAL_COMMUNITY)
Admission: RE | Admit: 2020-12-20 | Discharge: 2020-12-20 | Disposition: A | Discharge status: Home / Self Care | Payer: Medicare Other | Source: Ambulatory Visit | Attending: Otolaryngology | Admitting: Otolaryngology

## 2020-12-20 ENCOUNTER — Other Ambulatory Visit: Payer: Self-pay

## 2020-12-20 DIAGNOSIS — E010 Iodine-deficiency related diffuse (endemic) goiter: Secondary | ICD-10-CM

## 2020-12-20 DIAGNOSIS — J329 Chronic sinusitis, unspecified: Secondary | ICD-10-CM | POA: Diagnosis not present

## 2020-12-20 DIAGNOSIS — J321 Chronic frontal sinusitis: Secondary | ICD-10-CM | POA: Diagnosis not present

## 2020-12-20 DIAGNOSIS — R059 Cough, unspecified: Secondary | ICD-10-CM

## 2020-12-20 DIAGNOSIS — E041 Nontoxic single thyroid nodule: Secondary | ICD-10-CM | POA: Diagnosis not present

## 2020-12-20 DIAGNOSIS — J0110 Acute frontal sinusitis, unspecified: Secondary | ICD-10-CM | POA: Diagnosis not present

## 2020-12-20 DIAGNOSIS — J343 Hypertrophy of nasal turbinates: Secondary | ICD-10-CM | POA: Diagnosis not present

## 2020-12-20 DIAGNOSIS — H0520 Unspecified exophthalmos: Secondary | ICD-10-CM | POA: Diagnosis not present

## 2020-12-21 ENCOUNTER — Telehealth: Payer: Self-pay

## 2020-12-21 NOTE — Progress Notes (Signed)
Limited CT scan of the paranasal sinuses shows no active sinus disease.  There is proptosis.  She needs to see an ophthalmologist if she has not already.  Thyroid ultrasound shows 1 clinically insignificant right-sided nodule.  No additional attention required.

## 2020-12-21 NOTE — Telephone Encounter (Signed)
Patient has viewed results via mychart on 12/21/20

## 2020-12-21 NOTE — Telephone Encounter (Signed)
-----   Message from Flo Shanks, MD sent at 12/21/2020  8:49 AM EST ----- Limited CT scan of the paranasal sinuses shows no active sinus disease.  There is proptosis.  She needs to see an ophthalmologist if she has not already.  Thyroid ultrasound shows 1 clinically insignificant right-sided nodule.  No additional attention required.

## 2020-12-26 ENCOUNTER — Other Ambulatory Visit: Payer: Self-pay | Admitting: Family Medicine

## 2020-12-26 DIAGNOSIS — J328 Other chronic sinusitis: Secondary | ICD-10-CM

## 2021-01-02 ENCOUNTER — Telehealth: Payer: Self-pay

## 2021-01-02 NOTE — Telephone Encounter (Signed)
Disregard, opened in error

## 2021-01-02 NOTE — Telephone Encounter (Signed)
Opened in error

## 2021-01-25 ENCOUNTER — Other Ambulatory Visit: Payer: Self-pay | Admitting: Family Medicine

## 2021-01-25 DIAGNOSIS — J328 Other chronic sinusitis: Secondary | ICD-10-CM

## 2021-01-25 MED ORDER — BENZONATATE 100 MG PO CAPS
ORAL_CAPSULE | ORAL | 0 refills | Status: DC
  Filled 2021-01-25: qty 20, 10d supply, fill #0

## 2021-01-25 MED FILL — Mirtazapine Tab 30 MG: ORAL | 30 days supply | Qty: 30 | Fill #0 | Status: AC

## 2021-01-28 ENCOUNTER — Other Ambulatory Visit: Payer: Self-pay

## 2021-02-04 ENCOUNTER — Other Ambulatory Visit: Payer: Self-pay

## 2021-02-18 ENCOUNTER — Other Ambulatory Visit: Payer: Self-pay

## 2021-02-18 ENCOUNTER — Other Ambulatory Visit: Payer: Self-pay | Admitting: Family Medicine

## 2021-02-18 DIAGNOSIS — J328 Other chronic sinusitis: Secondary | ICD-10-CM

## 2021-02-18 MED ORDER — BENZONATATE 100 MG PO CAPS
ORAL_CAPSULE | ORAL | 0 refills | Status: DC
  Filled 2021-02-18: qty 20, 10d supply, fill #0

## 2021-02-18 MED FILL — Mirtazapine Tab 30 MG: ORAL | 30 days supply | Qty: 30 | Fill #1 | Status: AC

## 2021-02-18 MED FILL — Mirtazapine Tab 30 MG: ORAL | 30 days supply | Qty: 30 | Fill #1 | Status: CN

## 2021-03-21 ENCOUNTER — Other Ambulatory Visit: Payer: Self-pay

## 2021-03-21 MED FILL — Mirtazapine Tab 30 MG: ORAL | 30 days supply | Qty: 30 | Fill #2 | Status: AC

## 2021-03-21 MED FILL — Cetirizine HCl Tab 10 MG: ORAL | 30 days supply | Qty: 30 | Fill #0 | Status: CN

## 2021-03-23 ENCOUNTER — Other Ambulatory Visit: Payer: Self-pay | Admitting: Family Medicine

## 2021-03-23 DIAGNOSIS — J328 Other chronic sinusitis: Secondary | ICD-10-CM

## 2021-03-23 NOTE — Telephone Encounter (Signed)
Requested Prescriptions  Pending Prescriptions Disp Refills  . benzonatate (TESSALON) 100 MG capsule [Pharmacy Med Name: Benzonatate 100 MG Oral Capsule] 20 capsule 0    Sig: TAKE 1 CAPSULE BY MOUTH TWICE DAILY AS NEEDED FOR COUGH     Ear, Nose, and Throat:  Antitussives/Expectorants Passed - 03/23/2021 11:43 AM      Passed - Valid encounter within last 12 months    Recent Outpatient Visits          3 months ago [employer]   [facility] Flo Shanks, MD   3 months ago Other chronic sinusitis   [facility] Hoy Register, MD   9 months ago Right elbow pain   [facility] Hoy Register, MD   1 year ago Essential hypertension   [facility] Hoy Register, MD   1 year ago Tobacco use   [facility] Hoy Register, MD

## 2021-03-25 ENCOUNTER — Other Ambulatory Visit: Payer: Self-pay

## 2021-03-28 ENCOUNTER — Other Ambulatory Visit: Payer: Self-pay

## 2021-03-28 MED ORDER — ALBUTEROL SULFATE HFA 108 (90 BASE) MCG/ACT IN AERS
INHALATION_SPRAY | RESPIRATORY_TRACT | 2 refills | Status: DC
  Filled 2021-03-28: qty 8.5, 25d supply, fill #0

## 2021-03-28 MED FILL — Amlodipine Besylate Tab 10 MG (Base Equivalent): ORAL | 90 days supply | Qty: 90 | Fill #0 | Status: AC

## 2021-03-28 MED FILL — Carvedilol Tab 3.125 MG: ORAL | 90 days supply | Qty: 180 | Fill #0 | Status: AC

## 2021-03-28 MED FILL — Fluticasone Furoate-Vilanterol Aero Powd BA 100-25 MCG/ACT: RESPIRATORY_TRACT | 30 days supply | Qty: 60 | Fill #0 | Status: CN

## 2021-04-22 ENCOUNTER — Telehealth: Payer: Self-pay

## 2021-04-22 NOTE — Telephone Encounter (Signed)
Pt was called and a VM was left informing patient to return phone call. 

## 2021-04-23 ENCOUNTER — Ambulatory Visit (INDEPENDENT_AMBULATORY_CARE_PROVIDER_SITE_OTHER): Payer: Medicare Other | Admitting: Nurse Practitioner

## 2021-04-23 ENCOUNTER — Other Ambulatory Visit: Payer: Self-pay

## 2021-04-23 VITALS — BP 124/70 | HR 73 | Temp 97.9°F | Resp 18 | Ht 61.0 in | Wt 93.0 lb

## 2021-04-23 DIAGNOSIS — F17200 Nicotine dependence, unspecified, uncomplicated: Secondary | ICD-10-CM

## 2021-04-23 DIAGNOSIS — Z0000 Encounter for general adult medical examination without abnormal findings: Secondary | ICD-10-CM | POA: Diagnosis not present

## 2021-04-23 NOTE — Progress Notes (Signed)
Subjective:   Tammy Schwartz is a 65 y.o. female who presents for Medicare Annual (Subsequent) preventive examination.  Review of Systems    Review of Systems  Constitutional: Negative.   HENT: Negative.    Eyes: Negative.   Respiratory: Negative.    Cardiovascular: Negative.   Gastrointestinal: Negative.   Genitourinary: Negative.   Musculoskeletal: Negative.   Skin: Negative.   Neurological: Negative.   Endo/Heme/Allergies: Negative.   Psychiatric/Behavioral: Negative.     Cardiac Risk Factors include: advanced age (>59men, >59 women);sedentary lifestyle;smoking/ tobacco exposure     Objective:    Today's Vitals   04/23/21 1446  BP: 124/70  Pulse: 73  Resp: 18  Temp: 97.9 F (36.6 C)  SpO2: 100%  Weight: 93 lb (42.2 kg)  Height: 5\' 1"  (1.549 m)   Body mass index is 17.57 kg/m.  Advanced Directives 04/23/2021 04/24/2020 02/23/2020 08/01/2019 07/14/2018 07/07/2018 07/06/2018  Does Patient Have a Medical Advance Directive? No No No No No No No  Would patient like information on creating a medical advance directive? No - Patient declined No - Patient declined - Yes (MAU/Ambulatory/Procedural Areas - Information given) No - Patient declined No - Patient declined No - Patient declined  Pre-existing out of facility DNR order (yellow form or pink MOST form) - - - - - - -    Current Medications (verified) Outpatient Encounter Medications as of 04/23/2021  Medication Sig   albuterol (VENTOLIN HFA) 108 (90 Base) MCG/ACT inhaler INHALE 2 PUFFS INTO THE LUNGS EVERY 6 HOURS AS NEEDED   albuterol (VENTOLIN HFA) 108 (90 Base) MCG/ACT inhaler inhale 2 puffs into the lungs every 6 hours as needed   amLODipine (NORVASC) 10 MG tablet TAKE 1 TABLET (10 MG TOTAL) BY MOUTH DAILY.   aspirin EC 81 MG tablet Take 162 mg by mouth daily.   azelastine (ASTELIN) 0.1 % nasal spray Place 2 sprays into both nostrils 2 (two) times daily.   benzonatate (TESSALON) 100 MG capsule TAKE 1  CAPSULE BY MOUTH TWICE DAILY AS NEEDED FOR COUGH   carvedilol (COREG) 3.125 MG tablet TAKE 1 TABLET (3.125 MG TOTAL) BY MOUTH 2 (TWO) TIMES DAILY WITH A MEAL.   cetirizine (ZYRTEC) 10 MG tablet TAKE 1 TABLET BY MOUTH DAILY.   dextromethorphan-guaiFENesin (MUCINEX DM) 30-600 MG 12hr tablet Take 1 tablet by mouth 2 (two) times daily. (Patient not taking: Reported on 12/05/2020)   Dextromethorphan-guaiFENesin (MUCINEX DM) 30-600 MG TB12 TAKE 1 TABLET BY MOUTH 2 (TWO) TIMES DAILY.   DULoxetine (CYMBALTA) 60 MG capsule TAKE 1 CAPSULE (60 MG TOTAL) BY MOUTH DAILY.   fluticasone furoate-vilanterol (BREO ELLIPTA) 100-25 MCG/INH AEPB INHALE 1 PUFF INTO THE LUNGS DAILY.   gabapentin (NEURONTIN) 300 MG capsule Take 2 capsules (600 mg total) by mouth 2 (two) times daily.   hydrOXYzine (ATARAX/VISTARIL) 25 MG tablet Take 1 tablet (25 mg total) by mouth every 8 (eight) hours as needed.   lactulose (CHRONULAC) 10 GM/15ML solution Take 15 mLs (10 g total) by mouth 2 (two) times daily as needed for mild constipation.   meclizine (ANTIVERT) 25 MG tablet Take 1 tablet (25 mg total) by mouth 3 (three) times daily as needed for dizziness.   mirtazapine (REMERON) 30 MG tablet TAKE 1 TABLET (30 MG TOTAL) BY MOUTH AT BEDTIME.   montelukast (SINGULAIR) 10 MG tablet Take 10 mg by mouth daily.    Multiple Vitamin (MULTIVITAMIN WITH MINERALS) TABS Take 1 tablet by mouth daily.   naproxen (NAPROSYN) 500 MG tablet  Take 1 tablet (500 mg total) by mouth 2 (two) times daily.   pantoprazole (PROTONIX) 40 MG tablet TAKE 1 TABLET (40 MG TOTAL) BY MOUTH DAILY.   polyethylene glycol powder (GLYCOLAX/MIRALAX) powder Take 17 g by mouth daily.   predniSONE (DELTASONE) 20 MG tablet TAKE 1 TABLET (20 MG TOTAL) BY MOUTH DAILY WITH BREAKFAST.   triamcinolone cream (KENALOG) 0.1 % APPLY 1 APPLICATION TOPICALLY 2 (TWO) TIMES DAILY.   No facility-administered encounter medications on file as of 04/23/2021.    Allergies (verified) Patient has  no known allergies.   History: Past Medical History:  Diagnosis Date   Chronic back pain    HNp,spondylosis,radiculopathy   COPD (chronic obstructive pulmonary disease) (HCC)    Hyperlipidemia    was on choloesterol meds last yr-samples given in office but not needed since   Hypertension    takes Exforge daily   Joint pain    Shortness of breath    takes Singulair daily;with exertion   Sickle cell trait (HCC)    Spondylolysis    Tobacco use    Past Surgical History:  Procedure Laterality Date   BACK SURGERY     BREAST EXCISIONAL BIOPSY Left 2007   benign   BREAST SURGERY Left    lumpectomy   COLONOSCOPY     DILATION AND CURETTAGE OF UTERUS     LUMBAR LAMINECTOMY/DECOMPRESSION MICRODISCECTOMY  06/04/2012   Procedure: LUMBAR LAMINECTOMY/DECOMPRESSION MICRODISCECTOMY 1 LEVEL;  Surgeon: Carmela Hurt, MD;  Location: [facility];  Service: Neurosurgery;  Laterality: Left;  LEFT Lumbar five-sacral one diskectomy   LUMBAR LAMINECTOMY/DECOMPRESSION MICRODISCECTOMY Right 06/16/2013   Procedure: RIGHT Lumbar Five-Sacral One Microdiskectomy;  Surgeon: Carmela Hurt, MD;  Location: [facility];  Service: Neurosurgery;  Laterality: Right;  RIGHT Lumbar Five-Sacral One Microdiskectomy   LUMBAR LAMINECTOMY/DECOMPRESSION MICRODISCECTOMY Right 01/20/2014   Procedure: LUMBAR FOUR TO LUMBAR FIVE LUMBAR LAMINECTOMY/DECOMPRESSION MICRODISCECTOMY 1 LEVEL;  Surgeon: Carmela Hurt, MD;  Location: [facility];  Service: Neurosurgery;  Laterality: Right;  Right L45 laminectomy and foramintomy   Family History  Problem Relation Age of Onset   Cancer - Other Mother    CAD Father        Died age 7   Hypertension Sister    Social History   Socioeconomic History   Marital status: Married    Spouse name: Not on file   Number of children: Not on file   Years of education: Not on file   Highest education level: Not on file  Occupational History   Not on file  Tobacco Use   Smoking status: Every  Day    Packs/day: 0.25    Years: 20.00    Pack years: 5.00    Types: Cigarettes   Smokeless tobacco: Never  Vaping Use   Vaping Use: Never used  Substance and Sexual Activity   Alcohol use: No   Drug use: No   Sexual activity: Yes    Birth control/protection: Post-menopausal  Other Topics Concern   Not on file  Social History Narrative   Lives with husband and son.    Social Determinants of Health   Financial Resource Strain: High Risk   Difficulty of Paying Living Expenses: Hard  Food Insecurity: No Food Insecurity   Worried About Programme researcher, broadcasting/film/video in the Last Year: Never true   Ran Out of Food in the Last Year: Never true  Transportation Needs: No Transportation Needs   Lack of Transportation (Medical): No  Lack of Transportation (Non-Medical): No  Physical Activity: Inactive   Days of Exercise per Week: 0 days   Minutes of Exercise per Session: 0 min  Stress: Stress Concern Present   Feeling of Stress : To some extent  Social Connections: Press photographer of Communication with Friends and Family: More than three times a week   Frequency of Social Gatherings with Friends and Family: More than three times a week   Attends Religious Services: More than 4 times per year   Active Member of [employer] or Organizations: Yes   Attends Engineer, structural: More than 4 times per year   Marital Status: Married    Tobacco Counseling Ready to quit: No Counseling given: Yes   Clinical Intake:  Pre-visit preparation completed: No  Pain : No/denies pain     BMI - recorded: 17.57 Nutritional Status: BMI <19  Underweight Nutritional Risks: Unintentional weight loss Diabetes: No  How often do you need to have someone help you when you read instructions, pamphlets, or other written materials from your doctor or pharmacy?: 1 - Never What is the last grade level you completed in school?: 12th  Diabetic?no  Interpreter Needed?: No       Activities of Daily Living In your present state of health, do you have any difficulty performing the following activities: 04/23/2021  Hearing? N  Vision? N  Difficulty concentrating or making decisions? N  Walking or climbing stairs? N  Dressing or bathing? N  Doing errands, shopping? N  Preparing Food and eating ? N  Using the Toilet? N  In the past six months, have you accidently leaked urine? N  Do you have problems with loss of bowel control? N  Managing your Medications? N  Managing your Finances? N  Housekeeping or managing your Housekeeping? N  Some recent data might be hidden    Patient Care Team: Hoy Register, MD as PCP - General (Family Medicine) Rollene Rotunda, MD as PCP - Cardiology (Cardiology)  Indicate any recent Medical Services you may have received from other than [facility] providers in the past year (date may be approximate).     Assessment:   This is a routine wellness examination for Tammy Schwartz.   Dietary issues and exercise activities discussed: Current Exercise Habits: The patient does not participate in regular exercise at present, Exercise limited by: None identified   Goals Addressed             This Visit's Progress    Have 3 meals a day         Depression Screen PHQ 2/9 Scores 04/23/2021 05/30/2020 11/08/2018 07/14/2018 05/24/2018 03/31/2018 01/01/2018  PHQ - 2 Score 1 0 - 0 4 - 2  PHQ- 9 Score - 1 - 2 7 - 11  Exception Documentation - - Patient refusal - - Patient refusal -    Fall Risk Fall Risk  04/23/2021 12/05/2020 05/30/2020 07/05/2019 02/08/2019  Falls in the past year? 0 0 0 0 0  Number falls in past yr: 0 0 - - -  Injury with Fall? 0 0 - - -  Risk for fall due to : No Fall Risks - No Fall Risks - -  Follow up Education provided - - - -    FALL RISK PREVENTION PERTAINING TO THE HOME:  Any stairs in or around the home? No  If so, are there any without handrails? Yes  Home free of loose throw rugs in walkways, pet beds, electrical  cords, etc? Yes  Adequate lighting in your home to reduce risk of falls? Yes   ASSISTIVE DEVICES UTILIZED TO PREVENT FALLS:  Life alert? No  Use of a cane, walker or w/c? Yes  Grab bars in the bathroom? No  Shower chair or bench in shower? No  Elevated toilet seat or a handicapped toilet? No   TIMED UP AND GO:  Was the test performed? Yes .  Length of time to ambulate 10 feet: 3 sec.   Gait steady and fast without use of assistive device  Cognitive Function:        Immunizations Immunization History  Administered Date(s) Administered   Influenza,inj,Quad PF,6+ Mos 07/31/2017, 07/08/2018, 06/09/2019, 05/30/2020   PFIZER(Purple Top)SARS-COV-2 Vaccination 01/23/2020, 02/13/2020, 12/04/2020   Pneumococcal Conjugate-13 12/05/2020   Tdap 07/31/2017    TDAP status: Up to date  Flu Vaccine status: Up to date  Pneumococcal vaccine status: Up to date  Covid-19 vaccine status: Completed vaccines  Qualifies for Shingles Vaccine? Yes   Zostavax completed No   Shingrix Completed?: No.    Education has been provided regarding the importance of this vaccine. Patient has been advised to call insurance company to determine out of pocket expense if they have not yet received this vaccine. Advised may also receive vaccine at local pharmacy or Health Dept. Verbalized acceptance and understanding.  Screening Tests Health Maintenance  Topic Date Due   Zoster Vaccines- Shingrix (1 of 2) Never done   COVID-19 Vaccine (4 - Booster for Pfizer series) 04/03/2021   INFLUENZA VACCINE  05/13/2021   PNA vac Low Risk Adult (2 of 2 - PPSV23) 12/05/2021   MAMMOGRAM  09/12/2022   COLONOSCOPY (Pts 45-16yrs Insurance coverage will need to be confirmed)  12/26/2024   TETANUS/TDAP  08/01/2027   DEXA SCAN  Completed   Hepatitis C Screening  Completed   HPV VACCINES  Aged Out    Health Maintenance  Health Maintenance Due  Topic Date Due   Zoster Vaccines- Shingrix (1 of 2) Never done   COVID-19  Vaccine (4 - Booster for Pfizer series) 04/03/2021    Colorectal cancer screening: Type of screening: Colonoscopy. Completed  . Repeat every 5 years  Mammogram status: Completed 2022. Repeat every year  Bone Density status: Completed  . Results reflect: Bone density results: NORMAL. Repeat every 5 years.  Lung Cancer Screening: (Low Dose CT Chest recommended if Age 66-80 years, 30 pack-year currently smoking OR have quit w/in 15years.) does qualify.   Lung Cancer Screening Referral: referral placed  Additional Screening:  Hepatitis C Screening: does qualify; Completed   Vision Screening: Recommended annual ophthalmology exams for early detection of glaucoma and other disorders of the eye. Is the patient up to date with their annual eye exam?  Yes  Who is the provider or what is the name of the office in which the patient attends annual eye exams? [facility] If pt is not established with a provider, would they like to be referred to a provider to establish care? No .   Dental Screening: Recommended annual dental exams for proper oral hygiene  Community Resource Referral / Chronic Care Management: CRR required this visit?  No   CCM required this visit?  No      Plan:     I have personally reviewed and noted the following in the patient's chart:   Medical and social history Use of alcohol, tobacco or illicit drugs  Current medications and supplements including opioid prescriptions.  Functional ability  and status Nutritional status Physical activity Advanced directives List of other physicians Hospitalizations, surgeries, and ER visits in previous 12 months Vitals Screenings to include cognitive, depression, and falls Referrals and appointments  In addition, I have reviewed and discussed with patient certain preventive protocols, quality metrics, and best practice recommendations. A written personalized care plan for preventive services as well as general preventive  health recommendations were provided to patient.     Ivonne Andrewonya S Drexel Ivey, NP   04/23/2021

## 2021-04-23 NOTE — Patient Instructions (Addendum)
Tammy Schwartz , Thank you for taking time to come for your Medicare Wellness Visit. I appreciate your ongoing commitment to your health goals. Please review the following plan we discussed and let me know if I can assist you in the future.   These are the goals we discussed:  Goals      Have 3 meals a day        This is a list of the screening recommended for you and due dates:  Health Maintenance  Topic Date Due   Zoster (Shingles) Vaccine (1 of 2) Never done   COVID-19 Vaccine (4 - Booster for Pfizer series) 04/03/2021   Flu Shot  05/13/2021   Pneumonia vaccines (2 of 2 - PPSV23) 12/05/2021   Mammogram  09/12/2022   Colon Cancer Screening  12/26/2024   Tetanus Vaccine  08/01/2027   DEXA scan (bone density measurement)  Completed   Hepatitis C Screening: USPSTF Recommendation to screen - Ages 27-79 yo.  Completed   HPV Vaccine  Aged Out    Fall Prevention in the Home, Adult Falls can cause injuries and can happen to people of all ages. There are many things you can do to make your home safe and to help prevent falls. Ask forhelp when making these changes. What actions can I take to prevent falls? General Instructions Use good lighting in all rooms. Replace any light bulbs that burn out. Turn on the lights in dark areas. Use night-lights. Keep items that you use often in easy-to-reach places. Lower the shelves around your home if needed. Set up your furniture so you have a clear path. Avoid moving your furniture around. Do not have throw rugs or other things on the floor that can make you trip. Avoid walking on wet floors. If any of your floors are uneven, fix them. Add color or contrast paint or tape to clearly mark and help you see: Grab bars or handrails. First and last steps of staircases. Where the edge of each step is. If you use a stepladder: Make sure that it is fully opened. Do not climb a closed stepladder. Make sure the sides of the stepladder are locked in  place. Ask someone to hold the stepladder while you use it. Know where your pets are when moving through your home. What can I do in the bathroom?     Keep the floor dry. Clean up any water on the floor right away. Remove soap buildup in the tub or shower. Use nonskid mats or decals on the floor of the tub or shower. Attach bath mats securely with double-sided, nonslip rug tape. If you need to sit down in the shower, use a plastic, nonslip stool. Install grab bars by the toilet and in the tub and shower. Do not use towel bars as grab bars. What can I do in the bedroom? Make sure that you have a light by your bed that is easy to reach. Do not use any sheets or blankets for your bed that hang to the floor. Have a firm chair with side arms that you can use for support when you get dressed. What can I do in the kitchen? Clean up any spills right away. If you need to reach something above you, use a step stool with a grab bar. Keep electrical cords out of the way. Do not use floor polish or wax that makes floors slippery. What can I do with my stairs? Do not leave any items on the stairs. Make  sure that you have a light switch at the top and the bottom of the stairs. Make sure that there are handrails on both sides of the stairs. Fix handrails that are broken or loose. Install nonslip stair treads on all your stairs. Avoid having throw rugs at the top or bottom of the stairs. Choose a carpet that does not hide the edge of the steps on the stairs. Check carpeting to make sure that it is firmly attached to the stairs. Fix carpet that is loose or worn. What can I do on the outside of my home? Use bright outdoor lighting. Fix the edges of walkways and driveways and fix any cracks. Remove anything that might make you trip as you walk through a door, such as a raised step or threshold. Trim any bushes or trees on paths to your home. Check to see if handrails are loose or broken and that both  sides of all steps have handrails. Install guardrails along the edges of any raised decks and porches. Clear paths of anything that can make you trip, such as tools or rocks. Have leaves, snow, or ice cleared regularly. Use sand or salt on paths during winter. Clean up any spills in your garage right away. This includes grease or oil spills. What other actions can I take? Wear shoes that: Have a low heel. Do not wear high heels. Have rubber bottoms. Feel good on your feet and fit well. Are closed at the toe. Do not wear open-toe sandals. Use tools that help you move around if needed. These include: Canes. Walkers. Scooters. Crutches. Review your medicines with your doctor. Some medicines can make you feel dizzy. This can increase your chance of falling. Ask your doctor what else you can do to help prevent falls. Where to find more information Centers for Disease Control and Prevention, [employer]: FootballExhibition.com.brwww.cdc.gov [employer]: https://walker.com/www.nia.nih.gov Contact a doctor if: You are afraid of falling at home. You feel weak, drowsy, or dizzy at home. You fall at home. Summary There are many simple things that you can do to make your home safe and to help prevent falls. Ways to make your home safe include removing things that can make you trip and installing grab bars in the bathroom. Ask for help when making these changes in your home. This information is not intended to replace advice given to you by your health care provider. Make sure you discuss any questions you have with your healthcare provider. Document Revised: 05/02/2020 Document Reviewed: 05/02/2020 Elsevier Patient Education  2022 Else[employer] Inc.  Health Maintenance, Female Adopting a healthy lifestyle and getting preventive care are important in promoting health and wellness. Ask your health care provider about: The right schedule for you to have regular tests and exams. Things you can do on your own to prevent diseases and  keep yourself healthy. What should I know about diet, weight, and exercise? Eat a healthy diet  Eat a diet that includes plenty of vegetables, fruits, low-fat dairy products, and lean protein. Do not eat a lot of foods that are high in solid fats, added sugars, or sodium.  Maintain a healthy weight Body mass index (BMI) is used to identify weight problems. It estimates body fat based on height and weight. Your health care provider can help determineyour BMI and help you achieve or maintain a healthy weight. Get regular exercise Get regular exercise. This is one of the most important things you can do for your health. Most adults should: Exercise  for at least 150 minutes each week. The exercise should increase your heart rate and make you sweat (moderate-intensity exercise). Do strengthening exercises at least twice a week. This is in addition to the moderate-intensity exercise. Spend less time sitting. Even light physical activity can be beneficial. Watch cholesterol and blood lipids Have your blood tested for lipids and cholesterol at 66 years of age, then havethis test every 5 years. Have your cholesterol levels checked more often if: Your lipid or cholesterol levels are high. You are older than 66 years of age. You are at high risk for heart disease. What should I know about cancer screening? Depending on your health history and family history, you may need to have cancer screening at various ages. This may include screening for: Breast cancer. Cervical cancer. Colorectal cancer. Skin cancer. Lung cancer. What should I know about heart disease, diabetes, and high blood pressure? Blood pressure and heart disease High blood pressure causes heart disease and increases the risk of stroke. This is more likely to develop in people who have high blood pressure readings, are of African descent, or are overweight. Have your blood pressure checked: Every 3-5 years if you are 18-39 years of  age. Every year if you are 2 years old or older. Diabetes Have regular diabetes screenings. This checks your fasting blood sugar level. Have the screening done: Once every three years after age 71 if you are at a normal weight and have a low risk for diabetes. More often and at a younger age if you are overweight or have a high risk for diabetes. What should I know about preventing infection? Hepatitis B If you have a higher risk for hepatitis B, you should be screened for this virus. Talk with your health care provider to find out if you are at risk forhepatitis B infection. Hepatitis C Testing is recommended for: Everyone born from [birth year] through 1966. Anyone with known risk factors for hepatitis C. Sexually transmitted infections (STIs) Get screened for STIs, including gonorrhea and chlamydia, if: You are sexually active and are younger than 66 years of age. You are older than 66 years of age and your health care provider tells you that you are at risk for this type of infection. Your sexual activity has changed since you were last screened, and you are at increased risk for chlamydia or gonorrhea. Ask your health care provider if you are at risk. Ask your health care provider about whether you are at high risk for HIV. Your health care provider may recommend a prescription medicine to help prevent HIV infection. If you choose to take medicine to prevent HIV, you should first get tested for HIV. You should then be tested every 3 months for as long as you are taking the medicine. Pregnancy If you are about to stop having your period (premenopausal) and you may become pregnant, seek counseling before you get pregnant. Take 400 to 800 micrograms (mcg) of folic acid every day if you become pregnant. Ask for birth control (contraception) if you want to prevent pregnancy. Osteoporosis and menopause Osteoporosis is a disease in which the bones lose minerals and strength with aging. This can result  in bone fractures. If you are 43 years old or older, or if you are at risk for osteoporosis and fractures, ask your health care provider if you should: Be screened for bone loss. Take a calcium or vitamin D supplement to lower your risk of fractures. Be given hormone replacement therapy (HRT) to treat  symptoms of menopause. Follow these instructions at home: Lifestyle Do not use any products that contain nicotine or tobacco, such as cigarettes, e-cigarettes, and chewing tobacco. If you need help quitting, ask your health care provider. Do not use street drugs. Do not share needles. Ask your health care provider for help if you need support or information about quitting drugs. Alcohol use Do not drink alcohol if: Your health care provider tells you not to drink. You are pregnant, may be pregnant, or are planning to become pregnant. If you drink alcohol: Limit how much you use to 0-1 drink a day. Limit intake if you are breastfeeding. Be aware of how much alcohol is in your drink. In the [place] one drink equals one 12 oz bottle of beer (355 mL), one 5 oz glass of wine (148 mL), or one 1 oz glass of hard liquor (44 mL). General instructions Schedule regular health, dental, and eye exams. Stay current with your vaccines. Tell your health care provider if: You often feel depressed. You have ever been abused or do not feel safe at home. Summary Adopting a healthy lifestyle and getting preventive care are important in promoting health and wellness. Follow your health care provider's instructions about healthy diet, exercising, and getting tested or screened for diseases. Follow your health care provider's instructions on monitoring your cholesterol and blood pressure. This information is not intended to replace advice given to you by your health care provider. Make sure you discuss any questions you have with your healthcare provider. Document Revised: 09/22/2018 Document Reviewed:  09/22/2018 Elsevier Patient Education  2022 Else[employer] Inc.  Steps to Quit Smoking Smoking tobacco is the leading cause of preventable death. It can affect almost every organ in the body. Smoking puts you and those around you at risk for developing many serious chronic diseases. Quitting smoking can be difficult, but it is one of the best things that you can do for your health. It is nevertoo late to quit. How do I get ready to quit? When you decide to quit smoking, create a plan to help you succeed. Before you quit: Pick a date to quit. Set a date within the next 2 weeks to give you time to prepare. Write down the reasons why you are quitting. Keep this list in places where you will see it often. Tell your family, friends, and co-workers that you are quitting. Support from your loved ones can make quitting easier. Talk with your health care provider about your options for quitting smoking. Find out what treatment options are covered by your health insurance. Identify people, places, things, and activities that make you want to smoke (triggers). Avoid them. What first steps can I take to quit smoking? Throw away all cigarettes at home, at work, and in your car. Throw away smoking accessories, such as Set designer. Clean your car. Make sure to empty the ashtray. Clean your home, including curtains and carpets. What strategies can I use to quit smoking? Talk with your health care provider about combining strategies, such as taking medicines while you are also receiving in-person counseling. Using these two strategies together makes you more likely to succeed in quitting than if you used either strategy on its own. If you are pregnant or breastfeeding, talk with your health care provider about finding counseling or other support strategies to quit smoking. Do not take medicine to help you quit smoking unless your health care provider tells you to do so. To quit smoking: Quit right  away Quit smoking completely, instead of gradually reducing how much you smoke over a period of time. Research shows that stopping smoking right away is more successful than gradually quitting. Attend in-person counseling to help you build problem-solving skills. You are more likely to succeed in quitting if you attend counseling sessions regularly. Even short sessions of 10 minutes can be effective. Take medicine You may take medicines to help you quit smoking. Some medicines require a prescription and some you can purchase over-the-counter. Medicines may have nicotine in them to replace the nicotine in cigarettes. Medicines may: Help to stop cravings. Help to relieve withdrawal symptoms. Your health care provider may recommend: Nicotine patches, gum, or lozenges. Nicotine inhalers or sprays. Non-nicotine medicine that is taken by mouth. Find resources Find resources and support systems that can help you to quit smoking and remain smoke-free after you quit. These resources are most helpful when you use them often. They include: Online chats with a Veterinary surgeon. Telephone quitlines. Printed Materials engineer. Support groups or group counseling. Text messaging programs. Mobile phone apps or applications. Use apps that can help you stick to your quit plan by providing reminders, tips, and encouragement. There are many free apps for mobile devices as well as websites. Examples include Quit Guide from the [employer] and smokefree.gov What things can I do to make it easier to quit?  Reach out to your family and friends for support and encouragement. Call telephone quitlines (1-800-QUIT-NOW), reach out to support groups, or work with a counselor for support. Ask people who smoke to avoid smoking around you. Avoid places that trigger you to smoke, such as bars, parties, or smoke-break areas at work. Spend time with people who do not smoke. Lessen the stress in your life. Stress can be a smoking trigger for  some people. To lessen stress, try: Exercising regularly. Doing deep-breathing exercises. Doing yoga. Meditating. Performing a body scan. This involves closing your eyes, scanning your body from head to toe, and noticing which parts of your body are particularly tense. Try to relax the muscles in those areas. How will I feel when I quit smoking? Day 1 to 3 weeks Within the first 24 hours of quitting smoking, you may start to feel withdrawal symptoms. These symptoms are usually most noticeable 2-3 days after quitting, but they usually do not last for more than 2-3 weeks. You may experience these symptoms: Mood swings. Restlessness, anxiety, or irritability. Trouble concentrating. Dizziness. Strong cravings for sugary foods and nicotine. Mild weight gain. Constipation. Nausea. Coughing or a sore throat. Changes in how the medicines that you take for unrelated issues work in your body. Depression. Trouble sleeping (insomnia). Week 3 and afterward After the first 2-3 weeks of quitting, you may start to notice more positive results, such as: Improved sense of smell and taste. Decreased coughing and sore throat. Slower heart rate. Lower blood pressure. Clearer skin. The ability to breathe more easily. Fewer sick days. Quitting smoking can be very challenging. Do not get discouraged if you are not successful the first time. Some people need to make many attempts to quit before they achieve long-term success. Do your best to stick to your quit plan, and talk with your health care provider if you haveany questions or concerns. Summary Smoking tobacco is the leading cause of preventable death. Quitting smoking is one of the best things that you can do for your health. When you decide to quit smoking, create a plan to help you succeed. Quit smoking right away,  not slowly over a period of time. When you start quitting, seek help from your health care provider, family, or friends. This  information is not intended to replace advice given to you by your health care provider. Make sure you discuss any questions you have with your healthcare provider. Document Revised: 06/24/2019 Document Reviewed: 12/18/2018 Elsevier Patient Education  2022 Elsevier Inc.  Steps to Quit Smoking Smoking tobacco is the leading cause of preventable death. It can affect almost every organ in the body. Smoking puts you and people around you at risk for many serious, long-lasting (chronic) diseases. Quitting smoking can be hard, but it is one of the best things thatyou can do for your health. It is never too late to quit. How do I get ready to quit? When you decide to quit smoking, make a plan to help you succeed. Before you quit: Pick a date to quit. Set a date within the next 2 weeks to give you time to prepare. Write down the reasons why you are quitting. Keep this list in places where you will see it often. Tell your family, friends, and co-workers that you are quitting. Their support is important. Talk with your doctor about the choices that may help you quit. Find out if your health insurance will pay for these treatments. Know the people, places, things, and activities that make you want to smoke (triggers). Avoid them. What first steps can I take to quit smoking? Throw away all cigarettes at home, at work, and in your car. Throw away the things that you use when you smoke, such as ashtrays and lighters. Clean your car. Make sure to empty the ashtray. Clean your home, including curtains and carpets. What can I do to help me quit smoking? Talk with your doctor about taking medicines and seeing a counselor at the same time. You are more likely to succeed when you do both. If you are pregnant or breastfeeding, talk with your doctor about counseling or other ways to quit smoking. Do not take medicine to help you quit smoking unless your doctor tells you to do so. To quit smoking: Quit right  away Quit smoking totally, instead of slowly cutting back on how much you smoke over a period of time. Go to counseling. You are more likely to quit if you go to counseling sessions regularly. Take medicine You may take medicines to help you quit. Some medicines need a prescription, and some you can buy over-the-counter. Some medicines may contain a drug called nicotine to replace the nicotine in cigarettes. Medicines may: Help you to stop having the desire to smoke (cravings). Help to stop the problems that come when you stop smoking (withdrawal symptoms). Your doctor may ask you to use: Nicotine patches, gum, or lozenges. Nicotine inhalers or sprays. Non-nicotine medicine that is taken by mouth. Find resources Find resources and other ways to help you quit smoking and remain smoke-free after you quit. These resources are most helpful when you use them often. They include: Online chats with a Veterinary surgeon. Phone quitlines. Printed Materials engineer. Support groups or group counseling. Text messaging programs. Mobile phone apps. Use apps on your mobile phone or tablet that can help you stick to your quit plan. There are many free apps for mobile phones and tablets as well as websites. Examples include Quit Guide from the [employer] and smokefree.gov  What things can I do to make it easier to quit?  Talk to your family and friends. Ask them to support  and encourage you. Call a phone quitline (1-800-QUIT-NOW), reach out to support groups, or work with a Veterinary surgeon. Ask people who smoke to not smoke around you. Avoid places that make you want to smoke, such as: Bars. Parties. Smoke-break areas at work. Spend time with people who do not smoke. Lower the stress in your life. Stress can make you want to smoke. Try these things to help your stress: Getting regular exercise. Doing deep-breathing exercises. Doing yoga. Meditating. Doing a body scan. To do this, close your eyes, focus on one area of  your body at a time from head to toe. Notice which parts of your body are tense. Try to relax the muscles in those areas. How will I feel when I quit smoking? Day 1 to 3 weeks Within the first 24 hours, you may start to have some problems that come from quitting tobacco. These problems are very bad 2-3 days after you quit, but they do not often last for more than 2-3 weeks. You may get these symptoms: Mood swings. Feeling restless, nervous, angry, or annoyed. Trouble concentrating. Dizziness. Strong desire for high-sugar foods and nicotine. Weight gain. Trouble pooping (constipation). Feeling like you may vomit (nausea). Coughing or a sore throat. Changes in how the medicines that you take for other issues work in your body. Depression. Trouble sleeping (insomnia). Week 3 and afterward After the first 2-3 weeks of quitting, you may start to notice more positive results, such as: Better sense of smell and taste. Less coughing and sore throat. Slower heart rate. Lower blood pressure. Clearer skin. Better breathing. Fewer sick days. Quitting smoking can be hard. Do not give up if you fail the first time. Some people need to try a few times before they succeed. Do your best to stick to your quit plan, and talk with yourdoctor if you have any questions or concerns. Summary Smoking tobacco is the leading cause of preventable death. Quitting smoking can be hard, but it is one of the best things that you can do for your health. When you decide to quit smoking, make a plan to help you succeed. Quit smoking right away, not slowly over a period of time. When you start quitting, seek help from your doctor, family, or friends. This information is not intended to replace advice given to you by your health care provider. Make sure you discuss any questions you have with your healthcare provider. Document Revised: 06/24/2019 Document Reviewed: 12/18/2018 Elsevier Patient Education  2022 [employer].

## 2021-04-29 ENCOUNTER — Other Ambulatory Visit: Payer: Self-pay

## 2021-04-29 ENCOUNTER — Encounter: Payer: Self-pay | Admitting: Family Medicine

## 2021-04-29 ENCOUNTER — Ambulatory Visit: Payer: Medicare Other | Attending: Family Medicine | Admitting: Family Medicine

## 2021-04-29 VITALS — BP 161/76 | HR 66 | Ht 61.0 in | Wt 92.0 lb

## 2021-04-29 DIAGNOSIS — G4709 Other insomnia: Secondary | ICD-10-CM | POA: Diagnosis not present

## 2021-04-29 DIAGNOSIS — I10 Essential (primary) hypertension: Secondary | ICD-10-CM | POA: Diagnosis not present

## 2021-04-29 DIAGNOSIS — Z720 Tobacco use: Secondary | ICD-10-CM

## 2021-04-29 DIAGNOSIS — F17210 Nicotine dependence, cigarettes, uncomplicated: Secondary | ICD-10-CM | POA: Diagnosis not present

## 2021-04-29 DIAGNOSIS — R634 Abnormal weight loss: Secondary | ICD-10-CM

## 2021-04-29 MED ORDER — BUPROPION HCL ER (XL) 150 MG PO TB24: 150.0000 mg | ORAL_TABLET | Freq: Every day | ORAL | 3 refills | Status: DC

## 2021-04-29 MED ORDER — MIRTAZAPINE 30 MG PO TABS: ORAL_TABLET | ORAL | 1 refills | Status: DC

## 2021-04-29 MED ORDER — CARVEDILOL 3.125 MG PO TABS: ORAL_TABLET | Freq: Two times a day (BID) | ORAL | 1 refills | Status: DC

## 2021-04-29 MED ORDER — NICOTINE 7 MG/24HR TD PT24: 7.0000 mg | MEDICATED_PATCH | Freq: Every day | TRANSDERMAL | 1 refills | Status: DC

## 2021-04-29 MED ORDER — AMLODIPINE BESYLATE 10 MG PO TABS: ORAL_TABLET | Freq: Every day | ORAL | 1 refills | Status: DC

## 2021-04-29 NOTE — Progress Notes (Signed)
Subjective:  Patient ID: Tammy Schwartz, female    DOB: 1956-04-03  Age: 65 y.o. MRN: [id number]  CC: Weight Loss   HPI Tammy Schwartz  is a 65 year old female with history of hypertension, tobacco abuse, COPD, lumbar spondylosis status post back surgery who presents today for follow-up visit.  Interval History: She is concerned she is loosing weight. Lost 16 lbs in the last 6 months. She does not have much of an appetite and states she gets 'sick on her stomach' when she forces herself. She also endorses being under a lot stress. Placed on Remeron at her last visit 5 months ago. Thyroid panel was normal in 11/2020 Hyperplastic polyps found on Colonoscopy from 2016 Mammogram negative for malignancy in 09/2020 PAP smear not indicated due to age  She has smoked since she was a teenager. Initially did a pack in 3-4 days but now does a cigarrette with her cup of coffee. Her BP is elevated and she endorses compliance with her medications. BP was 120/74, 2 weeks ago. Past Medical History:  Diagnosis Date   Chronic back pain    HNp,spondylosis,radiculopathy   COPD (chronic obstructive pulmonary disease) (HCC)    Hyperlipidemia    was on choloesterol meds last yr-samples given in office but not needed since   Hypertension    takes Exforge daily   Joint pain    Shortness of breath    takes Singulair daily;with exertion   Sickle cell trait (HCC)    Spondylolysis    Tobacco use     Past Surgical History:  Procedure Laterality Date   BACK SURGERY     BREAST EXCISIONAL BIOPSY Left 2007   benign   BREAST SURGERY Left    lumpectomy   COLONOSCOPY     DILATION AND CURETTAGE OF UTERUS     LUMBAR LAMINECTOMY/DECOMPRESSION MICRODISCECTOMY  06/04/2012   Procedure: LUMBAR LAMINECTOMY/DECOMPRESSION MICRODISCECTOMY 1 LEVEL;  Surgeon: Carmela Hurt, MD;  Location: [facility];  Service: Neurosurgery;  Laterality: Left;  LEFT Lumbar five-sacral one diskectomy   LUMBAR  LAMINECTOMY/DECOMPRESSION MICRODISCECTOMY Right 06/16/2013   Procedure: RIGHT Lumbar Five-Sacral One Microdiskectomy;  Surgeon: Carmela Hurt, MD;  Location: [facility];  Service: Neurosurgery;  Laterality: Right;  RIGHT Lumbar Five-Sacral One Microdiskectomy   LUMBAR LAMINECTOMY/DECOMPRESSION MICRODISCECTOMY Right 01/20/2014   Procedure: LUMBAR FOUR TO LUMBAR FIVE LUMBAR LAMINECTOMY/DECOMPRESSION MICRODISCECTOMY 1 LEVEL;  Surgeon: Carmela Hurt, MD;  Location: [facility];  Service: Neurosurgery;  Laterality: Right;  Right L45 laminectomy and foramintomy    Family History  Problem Relation Age of Onset   Cancer - Other Mother    CAD Father        Died age 37   Hypertension Sister     No Known Allergies  Outpatient Medications Prior to Visit  Medication Sig Dispense Refill   albuterol (VENTOLIN HFA) 108 (90 Base) MCG/ACT inhaler INHALE 2 PUFFS INTO THE LUNGS EVERY 6 HOURS AS NEEDED 36 g 2   albuterol (VENTOLIN HFA) 108 (90 Base) MCG/ACT inhaler inhale 2 puffs into the lungs every 6 hours as needed 36 g 2   aspirin EC 81 MG tablet Take 162 mg by mouth daily.     azelastine (ASTELIN) 0.1 % nasal spray Place 2 sprays into both nostrils 2 (two) times daily. 30 mL 0   benzonatate (TESSALON) 100 MG capsule TAKE 1 CAPSULE BY MOUTH TWICE DAILY AS NEEDED FOR COUGH 20 capsule 0   cetirizine (ZYRTEC) 10 MG tablet TAKE  1 TABLET BY MOUTH DAILY. 30 tablet 2   dextromethorphan-guaiFENesin (MUCINEX DM) 30-600 MG 12hr tablet Take 1 tablet by mouth 2 (two) times daily. 20 tablet 0   Dextromethorphan-guaiFENesin (MUCINEX DM) 30-600 MG TB12 TAKE 1 TABLET BY MOUTH 2 (TWO) TIMES DAILY. 20 tablet 0   DULoxetine (CYMBALTA) 60 MG capsule TAKE 1 CAPSULE (60 MG TOTAL) BY MOUTH DAILY. 90 capsule 1   fluticasone furoate-vilanterol (BREO ELLIPTA) 100-25 MCG/INH AEPB INHALE 1 PUFF INTO THE LUNGS DAILY. 60 each 6   gabapentin (NEURONTIN) 300 MG capsule Take 2 capsules (600 mg total) by mouth 2 (two) times daily. 120  capsule 3   hydrOXYzine (ATARAX/VISTARIL) 25 MG tablet Take 1 tablet (25 mg total) by mouth every 8 (eight) hours as needed. 90 tablet 2   lactulose (CHRONULAC) 10 GM/15ML solution Take 15 mLs (10 g total) by mouth 2 (two) times daily as needed for mild constipation. 946 mL 0   meclizine (ANTIVERT) 25 MG tablet Take 1 tablet (25 mg total) by mouth 3 (three) times daily as needed for dizziness. 60 tablet 1   montelukast (SINGULAIR) 10 MG tablet Take 10 mg by mouth daily.      Multiple Vitamin (MULTIVITAMIN WITH MINERALS) TABS Take 1 tablet by mouth daily.     naproxen (NAPROSYN) 500 MG tablet Take 1 tablet (500 mg total) by mouth 2 (two) times daily. 30 tablet 0   pantoprazole (PROTONIX) 40 MG tablet TAKE 1 TABLET (40 MG TOTAL) BY MOUTH DAILY. 30 tablet 6   polyethylene glycol powder (GLYCOLAX/MIRALAX) powder Take 17 g by mouth daily. 3350 g 1   predniSONE (DELTASONE) 20 MG tablet TAKE 1 TABLET (20 MG TOTAL) BY MOUTH DAILY WITH BREAKFAST. 5 tablet 0   triamcinolone cream (KENALOG) 0.1 % APPLY 1 APPLICATION TOPICALLY 2 (TWO) TIMES DAILY. 80 g 0   amLODipine (NORVASC) 10 MG tablet TAKE 1 TABLET (10 MG TOTAL) BY MOUTH DAILY. 90 tablet 1   carvedilol (COREG) 3.125 MG tablet TAKE 1 TABLET (3.125 MG TOTAL) BY MOUTH 2 (TWO) TIMES DAILY WITH A MEAL. 180 tablet 1   mirtazapine (REMERON) 30 MG tablet TAKE 1 TABLET (30 MG TOTAL) BY MOUTH AT BEDTIME. 90 tablet 1   No facility-administered medications prior to visit.     ROS Review of Systems  Constitutional:  Positive for unexpected weight change. Negative for activity change, appetite change and fatigue.  HENT:  Negative for congestion, sinus pressure and sore throat.   Eyes:  Negative for visual disturbance.  Respiratory:  Negative for cough, chest tightness, shortness of breath and wheezing.   Cardiovascular:  Negative for chest pain and palpitations.  Gastrointestinal:  Positive for nausea. Negative for abdominal distention, abdominal pain and  constipation.  Endocrine: Negative for polydipsia.  Genitourinary:  Negative for dysuria and frequency.  Musculoskeletal:  Negative for arthralgias and back pain.  Skin:  Negative for rash.  Neurological:  Negative for tremors, light-headedness and numbness.  Hematological:  Does not bruise/bleed easily.  Psychiatric/Behavioral:  Negative for agitation and behavioral problems.    Objective:  BP (!) 161/76   Pulse 66   Ht 5\' 1"  (1.549 m)   Wt 92 lb (41.7 kg)   LMP 03/17/2012   SpO2 99%   BMI 17.38 kg/m   BP/Weight 04/29/2021 04/23/2021 12/07/2020  Systolic BP 161 124 160  Diastolic BP 76 70 81  Wt. (Lbs) 92 93 108.8  BMI 17.38 17.57 20.56      Physical Exam Constitutional:  Appearance: She is well-developed.     Comments: Underweight  Neck:     Vascular: No JVD.  Cardiovascular:     Rate and Rhythm: Normal rate.     Heart sounds: Normal heart sounds. No murmur heard. Pulmonary:     Effort: Pulmonary effort is normal.     Breath sounds: Normal breath sounds. No wheezing or rales.  Chest:     Chest wall: No tenderness.  Abdominal:     General: Bowel sounds are normal. There is no distension.     Palpations: Abdomen is soft. There is no mass.     Tenderness: There is no abdominal tenderness.  Musculoskeletal:        General: Normal range of motion.     Right lower leg: No edema.     Left lower leg: No edema.  Neurological:     Mental Status: She is alert and oriented to person, place, and time.  Psychiatric:        Mood and Affect: Mood normal.    CMP Latest Ref Rng & Units 02/23/2020 11/29/2019 08/26/2018  Glucose 70 - 99 mg/dL 85 93 80  BUN 8 - 23 mg/dL 5(L) 7(L) 5(L)  Creatinine 0.44 - 1.00 mg/dL 1.610.70 0.960.98 0.450.80  Sodium 135 - 145 mmol/L 143 143 144  Potassium 3.5 - 5.1 mmol/L 3.2(L) 4.3 3.5  Chloride 98 - 111 mmol/L 106 104 103  CO2 20 - 29 mmol/L - 23 22  Calcium 8.7 - 10.3 mg/dL - 9.5 9.5  Total Protein 6.5 - 8.1 g/dL - - -  Total Bilirubin 0.3 - 1.2  mg/dL - - -  Alkaline Phos 38 - 126 U/L - - -  AST 15 - 41 U/L - - -  ALT 0 - 44 U/L - - -    Lipid Panel     Component Value Date/Time   CHOL 204 (H) 01/01/2018 1223   TRIG 92 01/01/2018 1223   HDL 102 01/01/2018 1223   CHOLHDL 2.0 01/01/2018 1223   CHOLHDL 2.3 05/07/2016 0739   VLDL 20 05/07/2016 0739   LDLCALC 84 01/01/2018 1223    CBC    Component Value Date/Time   WBC 6.1 02/23/2020 1838   RBC 4.82 02/23/2020 1838   HGB 14.6 02/23/2020 1851   HGB 12.7 06/09/2017 1446   HCT 43.0 02/23/2020 1851   HCT 38.4 06/09/2017 1446   PLT 278 02/23/2020 1838   PLT 254 06/09/2017 1446   MCV 86.5 02/23/2020 1838   MCV 87 06/09/2017 1446   MCH 29.0 02/23/2020 1838   MCHC 33.6 02/23/2020 1838   RDW 15.6 (H) 02/23/2020 1838   RDW 15.8 (H) 06/09/2017 1446   LYMPHSABS 2.1 05/20/2018 0457   LYMPHSABS 2.4 06/09/2017 1446   MONOABS 0.4 05/20/2018 0457   EOSABS 0.3 05/20/2018 0457   EOSABS 0.2 06/09/2017 1446   BASOSABS 0.0 05/20/2018 0457   BASOSABS 0.0 06/09/2017 1446    No results found for: HGBA1C  Assessment & Plan:  1. Other insomnia Underlying stressors contributing Improved - mirtazapine (REMERON) 30 MG tablet; TAKE 1 TABLET (30 MG TOTAL) BY MOUTH AT BEDTIME.  Dispense: 90 tablet; Refill: 1  2. Tobacco use Spent 3 minutes counseling on smoking cessation and she is willing to work on quitting - CT CHEST LUNG CA SCREEN LOW DOSE W/O CM; Future - buPROPion (WELLBUTRIN XL) 150 MG 24 hr tablet; Take 1 tablet (150 mg total) by mouth daily. For smoking cessation  Dispense: 30 tablet; Refill:  3 - nicotine (NICODERM CQ) 7 mg/24hr patch; Place 1 patch (7 mg total) onto the skin daily.  Dispense: 28 patch; Refill: 1  3. Weight loss, unintentional Work up so far unrevealing Remeron has been ineffective - Ambulatory referral to Gastroenterology  4. Essential hypertension Uncontrolled Last BP was normal Recheck at next visit as current elevation could be due to recent tobacco  use. Will repeat at next visit and adjust regimen if indicated - amLODipine (NORVASC) 10 MG tablet; TAKE 1 TABLET (10 MG TOTAL) BY MOUTH DAILY.  Dispense: 90 tablet; Refill: 1 - carvedilol (COREG) 3.125 MG tablet; TAKE 1 TABLET (3.125 MG TOTAL) BY MOUTH 2 (TWO) TIMES DAILY WITH A MEAL.  Dispense: 180 tablet; Refill: 1   Meds ordered this encounter  Medications   mirtazapine (REMERON) 30 MG tablet    Sig: TAKE 1 TABLET (30 MG TOTAL) BY MOUTH AT BEDTIME.    Dispense:  90 tablet    Refill:  1   buPROPion (WELLBUTRIN XL) 150 MG 24 hr tablet    Sig: Take 1 tablet (150 mg total) by mouth daily. For smoking cessation    Dispense:  30 tablet    Refill:  3   nicotine (NICODERM CQ) 7 mg/24hr patch    Sig: Place 1 patch (7 mg total) onto the skin daily.    Dispense:  28 patch    Refill:  1   amLODipine (NORVASC) 10 MG tablet    Sig: TAKE 1 TABLET (10 MG TOTAL) BY MOUTH DAILY.    Dispense:  90 tablet    Refill:  1   carvedilol (COREG) 3.125 MG tablet    Sig: TAKE 1 TABLET (3.125 MG TOTAL) BY MOUTH 2 (TWO) TIMES DAILY WITH A MEAL.    Dispense:  180 tablet    Refill:  1    Follow-up: Return in about 3 months (around 07/30/2021) for Weight loss.       Hoy Register, MD, FAAFP. [facility] [place], [place] [phone]   04/29/2021, 10:10 PM

## 2021-04-29 NOTE — Progress Notes (Signed)
Discuss weight loss

## 2021-05-02 ENCOUNTER — Other Ambulatory Visit (HOSPITAL_COMMUNITY): Payer: Self-pay

## 2021-05-03 ENCOUNTER — Telehealth: Payer: Self-pay

## 2021-05-03 ENCOUNTER — Other Ambulatory Visit: Payer: Self-pay

## 2021-05-03 NOTE — Telephone Encounter (Signed)
Pt was called and informed that no paperwork has been received. Pt states that she will reach out and get paperwork faxed over.

## 2021-05-06 ENCOUNTER — Other Ambulatory Visit: Payer: Self-pay | Admitting: Family Medicine

## 2021-05-06 ENCOUNTER — Other Ambulatory Visit: Payer: Self-pay

## 2021-05-06 ENCOUNTER — Ambulatory Visit (HOSPITAL_COMMUNITY)
Admission: RE | Admit: 2021-05-06 | Discharge: 2021-05-06 | Disposition: A | Discharge status: Home / Self Care | Payer: Medicare Other | Source: Ambulatory Visit | Attending: Family Medicine | Admitting: Family Medicine

## 2021-05-06 DIAGNOSIS — Z122 Encounter for screening for malignant neoplasm of respiratory organs: Secondary | ICD-10-CM | POA: Diagnosis not present

## 2021-05-06 DIAGNOSIS — J328 Other chronic sinusitis: Secondary | ICD-10-CM

## 2021-05-06 DIAGNOSIS — F17210 Nicotine dependence, cigarettes, uncomplicated: Secondary | ICD-10-CM | POA: Insufficient documentation

## 2021-05-06 DIAGNOSIS — Z720 Tobacco use: Secondary | ICD-10-CM

## 2021-05-07 MED ORDER — BENZONATATE 100 MG PO CAPS: 100.0000 mg | ORAL_CAPSULE | Freq: Two times a day (BID) | ORAL | 0 refills | Status: DC

## 2021-05-08 ENCOUNTER — Other Ambulatory Visit: Payer: Self-pay | Admitting: Family Medicine

## 2021-05-08 DIAGNOSIS — I700 Atherosclerosis of aorta: Secondary | ICD-10-CM

## 2021-05-08 DIAGNOSIS — I2510 Atherosclerotic heart disease of native coronary artery without angina pectoris: Secondary | ICD-10-CM | POA: Insufficient documentation

## 2021-05-10 ENCOUNTER — Telehealth: Payer: Self-pay

## 2021-05-10 NOTE — Telephone Encounter (Signed)
-----   Message from Hoy Register, MD sent at 05/08/2021  1:10 PM EDT ----- Please inform her that her CT scan shows a tiny lung nodule which is interpreted as benign by the radiologist.  She also has cholesterol deposits in her arteries around the heart and I am referring her to a Cardiologist for further evaluation.

## 2021-05-10 NOTE — Telephone Encounter (Signed)
Pt has viewed results via Mychart. 

## 2021-06-19 ENCOUNTER — Encounter: Payer: Self-pay | Admitting: Gastroenterology

## 2021-07-12 ENCOUNTER — Ambulatory Visit: Payer: Medicare Other | Admitting: Gastroenterology

## 2021-07-17 ENCOUNTER — Telehealth: Payer: Self-pay | Admitting: Family Medicine

## 2021-07-17 ENCOUNTER — Other Ambulatory Visit: Payer: Self-pay

## 2021-07-17 DIAGNOSIS — J328 Other chronic sinusitis: Secondary | ICD-10-CM

## 2021-07-17 MED ORDER — BENZONATATE 100 MG PO CAPS
100.0000 mg | ORAL_CAPSULE | Freq: Two times a day (BID) | ORAL | 0 refills | Status: DC
Start: 2021-07-17 — End: 2021-08-10

## 2021-07-17 NOTE — Telephone Encounter (Signed)
Called pt left VM , RX sent to pharmacy 

## 2021-07-17 NOTE — Telephone Encounter (Signed)
Medication Refill - Medication: benzonatate (TESSALON) 100 MG capsule   Has the patient contacted their pharmacy? No. (Agent: If no, request that the patient contact the pharmacy for the refill.) (Agent: If yes, when and what did the pharmacy advise?)  Preferred Pharmacy (with phone number or street name):  [id number] [place], [place] - [address] AT [facility] OF [facility] & [address] Phone:  [phone]  Fax:  [phone]      Has the patient been seen for an appointment in the last year OR does the patient have an upcoming appointment? Yes.    Agent: Please be advised that RX refills may take up to 3 business days. We ask that you follow-up with your pharmacy.

## 2021-07-17 NOTE — Telephone Encounter (Signed)
Rx sent 

## 2021-07-31 ENCOUNTER — Ambulatory Visit (INDEPENDENT_AMBULATORY_CARE_PROVIDER_SITE_OTHER): Payer: Medicare Other | Admitting: Gastroenterology

## 2021-07-31 ENCOUNTER — Encounter: Payer: Self-pay | Admitting: Gastroenterology

## 2021-07-31 ENCOUNTER — Other Ambulatory Visit (INDEPENDENT_AMBULATORY_CARE_PROVIDER_SITE_OTHER): Payer: Medicare Other

## 2021-07-31 VITALS — BP 158/80 | HR 76 | Ht 61.0 in | Wt 92.6 lb

## 2021-07-31 DIAGNOSIS — R634 Abnormal weight loss: Secondary | ICD-10-CM

## 2021-07-31 LAB — LIPASE: Lipase: 34 U/L (ref 11.0–59.0)

## 2021-07-31 LAB — COMPREHENSIVE METABOLIC PANEL
ALT: 16 U/L (ref 0–35)
AST: 21 U/L (ref 0–37)
Albumin: 4.5 g/dL (ref 3.5–5.2)
Alkaline Phosphatase: 79 U/L (ref 39–117)
BUN: 9 mg/dL (ref 6–23)
CO2: 29 mEq/L (ref 19–32)
Calcium: 10.1 mg/dL (ref 8.4–10.5)
Chloride: 105 mEq/L (ref 96–112)
Creatinine, Ser: 0.99 mg/dL (ref 0.40–1.20)
GFR: 59.51 mL/min — ABNORMAL LOW (ref >60.00)
Glucose, Bld: 73 mg/dL (ref 70–99)
Potassium: 3.1 mEq/L — ABNORMAL LOW (ref 3.5–5.1)
Sodium: 143 mEq/L (ref 135–145)
Total Bilirubin: 0.5 mg/dL (ref 0.2–1.2)
Total Protein: 7.7 g/dL (ref 6.0–8.3)

## 2021-07-31 LAB — IBC PANEL
Iron: 65 ug/dL (ref 42–145)
Saturation Ratios: 15.6 % — ABNORMAL LOW (ref 20.0–50.0)
TIBC: 415.8 ug/dL (ref 250.0–450.0)
Transferrin: 297 mg/dL (ref 212.0–360.0)

## 2021-07-31 LAB — CBC WITH DIFFERENTIAL/PLATELET
Basophils Absolute: 0.1 10*3/uL (ref 0.0–0.1)
Basophils Relative: 1.1 % (ref 0.0–3.0)
Eosinophils Absolute: 0.1 10*3/uL (ref 0.0–0.7)
Eosinophils Relative: 2.3 % (ref 0.0–5.0)
HCT: 42.8 % (ref 36.0–46.0)
Hemoglobin: 14.2 g/dL (ref 12.0–15.0)
Lymphocytes Relative: 30.9 % (ref 12.0–46.0)
Lymphs Abs: 1.6 10*3/uL (ref 0.7–4.0)
MCHC: 33.2 g/dL (ref 30.0–36.0)
MCV: 88 fl (ref 78.0–100.0)
Monocytes Absolute: 0.4 10*3/uL (ref 0.1–1.0)
Monocytes Relative: 8.1 % (ref 3.0–12.0)
Neutro Abs: 2.9 10*3/uL (ref 1.4–7.7)
Neutrophils Relative %: 57.6 % (ref 43.0–77.0)
Platelets: 221 10*3/uL (ref 150.0–400.0)
RBC: 4.87 Mil/uL (ref 3.87–5.11)
RDW: 18.7 % — ABNORMAL HIGH (ref 11.5–15.5)
WBC: 5 10*3/uL (ref 4.0–10.5)

## 2021-07-31 LAB — FERRITIN: Ferritin: 23 ng/mL (ref 10.0–291.0)

## 2021-07-31 NOTE — Progress Notes (Signed)
HPI : Tammy Schwartz is a very pleasant 65 year old female with a history of COPD and chronic tobacco use who was referred to [place] by Dr. Hoy Register for unintentional weight loss.  The patient states that she has lost 16 pounds since February of this year.  Review of the medical record show that she weighed 120 pounds as recently as August of last year, and is now down to 92 pounds (BMI 17).  Patient states that she has noted a decrease in her appetite.  She states that she just does not feel hungry often.  She denies any changes in her diet and denies any new medications that may have affected her weight.  She denies any other GI symptoms such as abdominal pain, nausea or vomiting, chronic diarrhea or evidence of blood in her stool.  She states that she did have 1 single episode of black stool back in May or June of this year.  She denies any recurrence of this.  She reports having 1 formed bowel movement most every day.  She has occasional heartburn and takes Protonix as needed for this, which she says helps.  She also does note that he thinks she may be intolerant to coffee now.  She used to drink coffee all the time, but she has had at least 2 episodes where she experienced nausea and diarrhea after drinking coffee, so she stopped drinking it.  She denies problems with dysphagia.   She reports feeling well otherwise and feels her energy level is at her baseline. She had a colonoscopy in 2011 in which polyps were removed, and a repeat colonoscopy in 2016 which was unremarkable.  She was recommended to repeat in 10 years.  She has never had an upper endoscopy.  She states her mother was diagnosed with stomach cancer in her 59s.  She thinks both her brothers were diagnosed with throat cancer. She had a CT of the chest to screen for lung cancer which was negative for any suspicious pulmonary lesions, but did show significant calcification of the coronary arteries.  She has been referred to cardiology,  and has an appointment with them at the end of this month.  She does report exertional dyspnea which she has attributed to her COPD.   Past Medical History:  Diagnosis Date   Chronic back pain    HNp,spondylosis,radiculopathy   COPD (chronic obstructive pulmonary disease) (HCC)    Hyperlipidemia    was on choloesterol meds last yr-samples given in office but not needed since   Hypertension    takes Exforge daily   Joint pain    Shortness of breath    takes Singulair daily;with exertion   Sickle cell trait (HCC)    Spondylolysis    Tobacco use      Past Surgical History:  Procedure Laterality Date   BACK SURGERY     BREAST EXCISIONAL BIOPSY Left 2007   benign   BREAST SURGERY Left    lumpectomy   COLONOSCOPY     DILATION AND CURETTAGE OF UTERUS     LUMBAR LAMINECTOMY/DECOMPRESSION MICRODISCECTOMY  06/04/2012   Procedure: LUMBAR LAMINECTOMY/DECOMPRESSION MICRODISCECTOMY 1 LEVEL;  Surgeon: Carmela Hurt, MD;  Location: [facility];  Service: Neurosurgery;  Laterality: Left;  LEFT Lumbar five-sacral one diskectomy   LUMBAR LAMINECTOMY/DECOMPRESSION MICRODISCECTOMY Right 06/16/2013   Procedure: RIGHT Lumbar Five-Sacral One Microdiskectomy;  Surgeon: Carmela Hurt, MD;  Location: [facility];  Service: Neurosurgery;  Laterality: Right;  RIGHT Lumbar Five-Sacral One  Microdiskectomy   LUMBAR LAMINECTOMY/DECOMPRESSION MICRODISCECTOMY Right 01/20/2014   Procedure: LUMBAR FOUR TO LUMBAR FIVE LUMBAR LAMINECTOMY/DECOMPRESSION MICRODISCECTOMY 1 LEVEL;  Surgeon: Carmela Hurt, MD;  Location: [facility];  Service: Neurosurgery;  Laterality: Right;  Right L45 laminectomy and foramintomy   Family History  Problem Relation Age of Onset   Cancer - Other Mother    CAD Father        Died age 26   Hypertension Sister    Social History   Tobacco Use   Smoking status: Every Day    Packs/day: 0.25    Years: 20.00    Pack years: 5.00    Types: Cigarettes   Smokeless tobacco: Never   Vaping Use   Vaping Use: Never used  Substance Use Topics   Alcohol use: No   Drug use: No   Current Outpatient Medications  Medication Sig Dispense Refill   albuterol (VENTOLIN HFA) 108 (90 Base) MCG/ACT inhaler INHALE 2 PUFFS INTO THE LUNGS EVERY 6 HOURS AS NEEDED 36 g 2   amLODipine (NORVASC) 10 MG tablet TAKE 1 TABLET (10 MG TOTAL) BY MOUTH DAILY. 90 tablet 1   aspirin EC 81 MG tablet Take 162 mg by mouth daily.     azelastine (ASTELIN) 0.1 % nasal spray Place 2 sprays into both nostrils 2 (two) times daily. 30 mL 0   benzonatate (TESSALON) 100 MG capsule Take 1 capsule (100 mg total) by mouth 2 (two) times daily. 20 capsule 0   buPROPion (WELLBUTRIN XL) 150 MG 24 hr tablet Take 1 tablet (150 mg total) by mouth daily. For smoking cessation 30 tablet 3   carvedilol (COREG) 3.125 MG tablet TAKE 1 TABLET (3.125 MG TOTAL) BY MOUTH 2 (TWO) TIMES DAILY WITH A MEAL. 180 tablet 1   cetirizine (ZYRTEC) 10 MG tablet TAKE 1 TABLET BY MOUTH DAILY. 30 tablet 2   dextromethorphan-guaiFENesin (MUCINEX DM) 30-600 MG 12hr tablet Take 1 tablet by mouth 2 (two) times daily. 20 tablet 0   DULoxetine (CYMBALTA) 60 MG capsule TAKE 1 CAPSULE (60 MG TOTAL) BY MOUTH DAILY. 90 capsule 1   meclizine (ANTIVERT) 25 MG tablet Take 1 tablet (25 mg total) by mouth 3 (three) times daily as needed for dizziness. 60 tablet 1   mirtazapine (REMERON) 30 MG tablet TAKE 1 TABLET (30 MG TOTAL) BY MOUTH AT BEDTIME. 90 tablet 1   montelukast (SINGULAIR) 10 MG tablet Take 10 mg by mouth daily.      Multiple Vitamin (MULTIVITAMIN WITH MINERALS) TABS Take 1 tablet by mouth daily.     triamcinolone cream (KENALOG) 0.1 % APPLY 1 APPLICATION TOPICALLY 2 (TWO) TIMES DAILY. 80 g 0   lactulose (CHRONULAC) 10 GM/15ML solution Take 15 mLs (10 g total) by mouth 2 (two) times daily as needed for mild constipation. (Patient not taking: Reported on 07/31/2021) 946 mL 0   pantoprazole (PROTONIX) 40 MG tablet TAKE 1 TABLET (40 MG TOTAL) BY  MOUTH DAILY. 30 tablet 6   polyethylene glycol powder (GLYCOLAX/MIRALAX) powder Take 17 g by mouth daily. 3350 g 1   predniSONE (DELTASONE) 20 MG tablet TAKE 1 TABLET (20 MG TOTAL) BY MOUTH DAILY WITH BREAKFAST. 5 tablet 0   No current facility-administered medications for this visit.   No Known Allergies   Review of Systems: All systems reviewed and negative except where noted in HPI.    No results found.  Physical Exam: BP (!) 158/80   Pulse 76   Ht 5\' 1"  (1.549 m)  Wt 92 lb 9.6 oz (42 kg)   LMP 03/17/2012   BMI 17.50 kg/m  Constitutional: Pleasant, thin, African-American female in no acute distress. HEENT: Normocephalic and atraumatic. Conjunctivae are normal. No scleral icterus.  Upper and lower dentures Cardiovascular: Normal rate, regular rhythm.  Pulmonary/chest: Effort normal and breath sounds normal. No wheezing, rales or rhonchi. Abdominal: Soft, nondistended, nontender. Bowel sounds active throughout. There are no masses palpable. No hepatomegaly. Extremities: no edema Neurological: Alert and oriented to person place and time. Skin: Skin is warm and dry. No rashes noted. Psychiatric: Normal mood and affect. Behavior is normal.  CBC    Component Value Date/Time   WBC 6.1 02/23/2020 1838   RBC 4.82 02/23/2020 1838   HGB 14.6 02/23/2020 1851   HGB 12.7 06/09/2017 1446   HCT 43.0 02/23/2020 1851   HCT 38.4 06/09/2017 1446   PLT 278 02/23/2020 1838   PLT 254 06/09/2017 1446   MCV 86.5 02/23/2020 1838   MCV 87 06/09/2017 1446   MCH 29.0 02/23/2020 1838   MCHC 33.6 02/23/2020 1838   RDW 15.6 (H) 02/23/2020 1838   RDW 15.8 (H) 06/09/2017 1446   LYMPHSABS 2.1 05/20/2018 0457   LYMPHSABS 2.4 06/09/2017 1446   MONOABS 0.4 05/20/2018 0457   EOSABS 0.3 05/20/2018 0457   EOSABS 0.2 06/09/2017 1446   BASOSABS 0.0 05/20/2018 0457   BASOSABS 0.0 06/09/2017 1446    CMP     Component Value Date/Time   NA 143 02/23/2020 1851   NA 143 11/29/2019 1533   K 3.2 (L)  02/23/2020 1851   CL 106 02/23/2020 1851   CO2 23 11/29/2019 1533   GLUCOSE 85 02/23/2020 1851   BUN 5 (L) 02/23/2020 1851   BUN 7 (L) 11/29/2019 1533   CREATININE 0.70 02/23/2020 1851   CALCIUM 9.5 11/29/2019 1533   PROT 7.8 06/21/2018 2037   PROT 6.4 01/01/2018 1223   ALBUMIN 4.3 06/21/2018 2037   ALBUMIN 4.3 01/01/2018 1223   AST 24 06/21/2018 2037   ALT 21 06/21/2018 2037   ALKPHOS 60 06/21/2018 2037   BILITOT 0.5 06/21/2018 2037   BILITOT 0.2 01/01/2018 1223   GFRNONAA 61 11/29/2019 1533   GFRAA 71 11/29/2019 1533     ASSESSMENT AND PLAN: 65 year old female with heavy smoking history with 16 pounds unintentional weight loss with associated decreased in appetite, but no other changes in her bowel habits or other GI symptoms.  CT of the chest was unremarkable.  She has not had any imaging of her abdomen.  She had a normal colonoscopy in 2016.  She has never had an upper endoscopy.  She has a family history of stomach cancer.  I recommended we schedule her for an upper endoscopy to exclude upper GI malignancy and get a CT abdomen pelvis with contrast.  We will get basic labs today (CBC, CMP, lipase) as well as iron, B12/folate and prealbumin levels. The patient was noted to have significant coronary calcifications on her CT of the chest, and does admit to exertional dyspnea, which may be related to her COPD, but she has a cardiology evaluation later this month.  I think it would be prudent to wait on performing her EGD until after her cardiology evaluation is complete.  The patient will contact our office after her cardiology evaluation to schedule her EGD.  Unintentional weight loss -CT abd/pelvis with IV/p.o. contrast -EGD after cardiology eval -CBC, cmp, iron panel, B12/folate, prealbumin -Consider repeat colonoscopy no etiology is found with this initial  evaluation.   The details, risks (including bleeding, perforation, infection, missed lesions, medication reactions and  possible hospitalization or surgery if complications occur), benefits, and alternatives to EGD with possible biopsy and possible dilation were discussed with the patient and she consents to proceed.   Procedure will be scheduled at a later date.  Haig Gerardo E. Tomasa Rand, MD [facility]   Hoy Register, MD

## 2021-07-31 NOTE — Patient Instructions (Addendum)
If you are age 66 or older, your body mass index should be between 23-30. Your Body mass index is 17.5 kg/m. If this is out of the aforementioned range listed, please consider follow up with your Primary Care Provider.  If you are age 74 or younger, your body mass index should be between 19-25. Your Body mass index is 17.5 kg/m. If this is out of the aformentioned range listed, please consider follow up with your Primary Care Provider.   Your provider has requested that you go to the basement level for lab work before leaving today. Press "B" on the elevator. The lab is located at the first door on the left as you exit the elevator.   You have been scheduled for a CT scan of the abdomen and pelvis at [facility], 1st floor Radiology. You are scheduled on 08/08/21  at 12:30pm. You should arrive 15 minutes prior to your appointment time for registration.  Please pick up 2 bottles of contrast from [facility] at least 3 days prior to your scan. The solution may taste better if refrigerated, but do NOT add ice or any other liquid to this solution. Shake well before drinking.   Please follow the written instructions below on the day of your exam:   1) Do not eat anything after 8:30am (4 hours prior to your test)   2) Drink 1 bottle of contrast @ 10:30am (2 hours prior to your exam)  Remember to shake well before drinking and do NOT pour over ice.     Drink 1 bottle of contrast @ 11:30am (1 hour prior to your exam)   You may take any medications as prescribed with a small amount of water, if necessary. If you take any of the following medications: METFORMIN, GLUCOPHAGE, GLUCOVANCE, AVANDAMET, RIOMET, FORTAMET, [place] MET, JANUMET, GLUMETZA or METAGLIP, you MAY be asked to HOLD this medication 48 hours AFTER the exam.   The purpose of you drinking the oral contrast is to aid in the visualization of your intestinal tract. The contrast solution may cause some diarrhea. Depending on your  individual set of symptoms, you may also receive an intravenous injection of x-ray contrast/dye. Plan on being at [facility] for 45 minutes or longer, depending on the type of exam you are having performed.   If you have any questions regarding your exam or if you need to reschedule, you may call [facility] at [phone] between the hours of 8:00 am and 5:00 pm, Monday-Friday.    The [facility] providers would like to encourage you to use [facility] to communicate with providers for non-urgent requests or questions.  Due to long hold times on the telephone, sending your provider a message by [facility] may be a faster and more efficient way to get a response.  Please allow 48 business hours for a response.  Please remember that this is for non-urgent requests.   It was a pleasure to see you today!  Thank you for trusting me with your gastrointestinal care!    Scott E.Candis Schatz, MD

## 2021-08-01 ENCOUNTER — Encounter: Payer: Self-pay | Admitting: Family Medicine

## 2021-08-01 ENCOUNTER — Encounter: Payer: Self-pay | Admitting: Gastroenterology

## 2021-08-01 ENCOUNTER — Other Ambulatory Visit: Payer: Self-pay

## 2021-08-01 ENCOUNTER — Other Ambulatory Visit: Payer: Self-pay | Admitting: Family Medicine

## 2021-08-01 LAB — PREALBUMIN: Prealbumin: 29 mg/dL (ref 17–34)

## 2021-08-01 NOTE — Progress Notes (Signed)
Tammy Schwartz, your labs looked good.  Your prealbumin was normal and this indicates you are getting adequate nutrition.  Your iron labs were essentially normal and I don't think iron supplementation is necessary.  Your potassium level was a little low and you may benefit from increasing dietary potassium (eat a banana daily).  Alternatively, we could prescribe a potassium pill to take daily.  Please let us know when your cardiology evaluation is complete and we will schedule your EGD.

## 2021-08-02 ENCOUNTER — Other Ambulatory Visit: Payer: Self-pay

## 2021-08-02 MED ORDER — PANTOPRAZOLE SODIUM 40 MG PO TBEC
DELAYED_RELEASE_TABLET | Freq: Every day | ORAL | 6 refills | Status: DC
  Filled 2021-08-02: qty 30, 30d supply, fill #0

## 2021-08-02 NOTE — Telephone Encounter (Signed)
Requested Prescriptions  Pending Prescriptions Disp Refills  . pantoprazole (PROTONIX) 40 MG tablet 30 tablet 6    Sig: TAKE 1 TABLET (40 MG TOTAL) BY MOUTH DAILY.     Gastroenterology: Proton Pump Inhibitors Passed - 08/01/2021  3:07 PM      Passed - Valid encounter within last 12 months    Recent Outpatient Visits          3 months ago Weight loss, unintentional   [facility] Hoy Register, MD   7 months ago Thyromegaly   [facility] Flo Shanks, MD   8 months ago Other chronic sinusitis   [facility] Hoy Register, MD   1 year ago Right elbow pain   [facility] Hoy Register, MD   1 year ago Essential hypertension   [facility] Hoy Register, MD      Future Appointments            In 1 week Pricilla Riffle, MD [facility] [address] Office, [facility]

## 2021-08-05 ENCOUNTER — Other Ambulatory Visit: Payer: Self-pay

## 2021-08-08 ENCOUNTER — Ambulatory Visit (HOSPITAL_COMMUNITY)
Admission: RE | Admit: 2021-08-08 | Discharge: 2021-08-08 | Disposition: A | Discharge status: Home / Self Care | Payer: Medicare Other | Source: Ambulatory Visit | Attending: Gastroenterology | Admitting: Gastroenterology

## 2021-08-08 ENCOUNTER — Encounter (HOSPITAL_COMMUNITY): Payer: Self-pay

## 2021-08-08 ENCOUNTER — Other Ambulatory Visit: Payer: Self-pay

## 2021-08-08 DIAGNOSIS — R634 Abnormal weight loss: Secondary | ICD-10-CM | POA: Insufficient documentation

## 2021-08-08 DIAGNOSIS — N200 Calculus of kidney: Secondary | ICD-10-CM | POA: Diagnosis not present

## 2021-08-08 MED ORDER — IOHEXOL 350 MG/ML SOLN
75.0000 mL | Freq: Once | INTRAVENOUS | Status: AC | PRN
  Administered 2021-08-08: 75 mL via INTRAVENOUS

## 2021-08-09 ENCOUNTER — Encounter: Payer: Self-pay | Admitting: Internal Medicine

## 2021-08-09 ENCOUNTER — Other Ambulatory Visit: Payer: Self-pay | Admitting: Family Medicine

## 2021-08-09 ENCOUNTER — Ambulatory Visit (INDEPENDENT_AMBULATORY_CARE_PROVIDER_SITE_OTHER): Payer: Medicare Other | Admitting: Internal Medicine

## 2021-08-09 VITALS — BP 126/84 | HR 74 | Ht 61.0 in | Wt 92.8 lb

## 2021-08-09 DIAGNOSIS — J328 Other chronic sinusitis: Secondary | ICD-10-CM

## 2021-08-09 DIAGNOSIS — Z720 Tobacco use: Secondary | ICD-10-CM

## 2021-08-09 DIAGNOSIS — I2510 Atherosclerotic heart disease of native coronary artery without angina pectoris: Secondary | ICD-10-CM | POA: Diagnosis not present

## 2021-08-09 MED ORDER — ROSUVASTATIN CALCIUM 10 MG PO TABS: 10.0000 mg | ORAL_TABLET | Freq: Every day | ORAL | 3 refills | Status: DC

## 2021-08-09 MED ORDER — METOPROLOL TARTRATE 50 MG PO TABS: 50.0000 mg | ORAL_TABLET | Freq: Once | ORAL | 0 refills | Status: DC

## 2021-08-09 NOTE — Patient Instructions (Addendum)
Medication Instructions:  Your physician has recommended you make the following change in your medication:  START CRESTOR 10 MG DAILY.  *If you need a refill on your cardiac medications before your next appointment, please call your pharmacy*   Lab Work: TODAY: BMET If you have labs (blood work) drawn today and your tests are completely normal, you will receive your results only by: MyChart Message (if you have MyChart) OR A paper copy in the mail If you have any lab test that is abnormal or we need to change your treatment, we will call you to review the results.   Testing/Procedures:   Your cardiac CT will be scheduled at one of the below locations:   [facility] [address] [place], [place] [address] [phone]   If scheduled at [facility], please arrive at the [facility] main entrance (entrance A) of [facility] 30 minutes prior to test start time. You can use the FREE valet parking offered at the main entrance (encouraged to control the heart rate for the test) Proceed to the [facility] Radiology Department (first floor) to check-in and test prep.  If scheduled at [facility], please arrive 15 mins early for check-in and test prep.  Please follow these instructions carefully (unless otherwise directed):  Hold all erectile dysfunction medications at least 3 days (72 hrs) prior to test.  On the Night Before the Test: Be sure to Drink plenty of water. Do not consume any caffeinated/decaffeinated beverages or chocolate 12 hours prior to your test. Do not take any antihistamines 12 hours prior to your test.  On the Day of the Test: Drink plenty of water until 1 hour prior to the test. Do not eat any food 4 hours prior to the test. You may take your regular medications prior to the test.  Take metoprolol (Lopressor) 50 MG two hours prior to test. HOLD Furosemide/Hydrochlorothiazide morning of the  test. FEMALES- please wear underwire-free bra if available, avoid dresses & tight clothing  After the Test: Drink plenty of water. After receiving IV contrast, you may experience a mild flushed feeling. This is normal. On occasion, you may experience a mild rash up to 24 hours after the test. This is not dangerous. If this occurs, you can take Benadryl 25 mg and increase your fluid intake. If you experience trouble breathing, this can be serious. If it is severe call 911 IMMEDIATELY. If it is mild, please call our office. If you take any of these medications: Glipizide/Metformin, Avandament, Glucavance, please do not take 48 hours after completing test unless otherwise instructed.  Please allow 2-4 weeks for scheduling of routine cardiac CTs. Some insurance companies require a pre-authorization which may delay scheduling of this test.   For non-scheduling related questions, please contact the cardiac imaging nurse navigator should you have any questions/concerns: Rockwell Alexandria, Cardiac Imaging Nurse Navigator Larey Brick, Cardiac Imaging Nurse Navigator [facility] Direct Office Dial: [phone]   For scheduling needs, including cancellations and rescheduling, please call [place], [phone].    Follow-Up: At [facility], you and your health needs are our priority.  As part of our continuing mission to provide you with exceptional heart care, we have created designated Provider Care Teams.  These Care Teams include your primary Cardiologist (physician) and Advanced Practice Providers (APPs -  Physician Assistants and Nurse Practitioners) who all work together to provide you with the care you need, when you need it.  We recommend signing up for the patient portal called "MyChart".  Sign up information is provided on this After Visit Summary.  MyChart is used to connect with patients for Virtual Visits (Telemedicine).  Patients are able to view lab/test  results, encounter notes, upcoming appointments, etc.  Non-urgent messages can be sent to your provider as well.   To learn more about what you can do with MyChart, go to [email or web address].    Your next appointment:   MARCH 2023   The format for your next appointment:   In Person  Provider:   You may see DR. ROSS or one of the following Advanced Practice Providers on your designated Care Team:   Tereso Newcomer, PA-C [place], [place]

## 2021-08-09 NOTE — Progress Notes (Signed)
Tammy Schwartz,  Your CT did not show any concerning findings or abnormalities to account for your weight loss.  The CT did show a kidney stone and some kidney cysts, which are common incidental findings.  No intervention needed for kidney stones if no symptoms (severe flank pain).  Recommend staying hydrated.   Please let me know if there are further questions about your CT scan   

## 2021-08-09 NOTE — Progress Notes (Signed)
Cardiology Office Note   Date:  08/09/2021   ID:  Tammy Schwartz, DOB 1956-09-18, MRN [id number]  PCP:  Hoy Register, MD  Cardiologist:   Dietrich Pates, MD   Patient referred for eval of CAD, calcifications seen on CT   History of Present Illness: Tammy Schwartz is a 65 y.o. female with a long hx of tobacco abuse (continues to smoke), COPD.  REcently sen has been seen by GI for evaluation of wt loss (120 to 92)  Notes occasional heartburn   CT done as part of evaluation and it showed signif calc of LM/LAD  The pt is a difficult historian   She does not some burning in her chest    Not associated with activity    Eases on own.   Has attrib to "heartburn"    Per GI waiting for GI evaluation until cleared from cardiac standpont  She still smokes, says she is trying to quit   Current Meds  Medication Sig   albuterol (VENTOLIN HFA) 108 (90 Base) MCG/ACT inhaler INHALE 2 PUFFS INTO THE LUNGS EVERY 6 HOURS AS NEEDED   amLODipine (NORVASC) 10 MG tablet TAKE 1 TABLET (10 MG TOTAL) BY MOUTH DAILY.   aspirin EC 81 MG tablet Take 162 mg by mouth daily.   azelastine (ASTELIN) 0.1 % nasal spray Place 2 sprays into both nostrils 2 (two) times daily.   benzonatate (TESSALON) 100 MG capsule Take 1 capsule (100 mg total) by mouth 2 (two) times daily.   buPROPion (WELLBUTRIN XL) 150 MG 24 hr tablet Take 1 tablet (150 mg total) by mouth daily. For smoking cessation   carvedilol (COREG) 3.125 MG tablet TAKE 1 TABLET (3.125 MG TOTAL) BY MOUTH 2 (TWO) TIMES DAILY WITH A MEAL.   cetirizine (ZYRTEC) 10 MG tablet TAKE 1 TABLET BY MOUTH DAILY.   dextromethorphan-guaiFENesin (MUCINEX DM) 30-600 MG 12hr tablet Take 1 tablet by mouth 2 (two) times daily.   DULoxetine (CYMBALTA) 60 MG capsule TAKE 1 CAPSULE (60 MG TOTAL) BY MOUTH DAILY.   lactulose (CHRONULAC) 10 GM/15ML solution Take 15 mLs (10 g total) by mouth 2 (two) times daily as needed for mild constipation.   meclizine (ANTIVERT)  25 MG tablet Take 1 tablet (25 mg total) by mouth 3 (three) times daily as needed for dizziness.   mirtazapine (REMERON) 30 MG tablet TAKE 1 TABLET (30 MG TOTAL) BY MOUTH AT BEDTIME.   montelukast (SINGULAIR) 10 MG tablet Take 10 mg by mouth daily.    Multiple Vitamin (MULTIVITAMIN WITH MINERALS) TABS Take 1 tablet by mouth daily.   pantoprazole (PROTONIX) 40 MG tablet TAKE 1 TABLET (40 MG TOTAL) BY MOUTH DAILY.   polyethylene glycol powder (GLYCOLAX/MIRALAX) powder Take 17 g by mouth daily.   predniSONE (DELTASONE) 20 MG tablet TAKE 1 TABLET (20 MG TOTAL) BY MOUTH DAILY WITH BREAKFAST.   triamcinolone cream (KENALOG) 0.1 % APPLY 1 APPLICATION TOPICALLY 2 (TWO) TIMES DAILY.     Allergies:   Patient has no known allergies.   Past Medical History:  Diagnosis Date   Chronic back pain    HNp,spondylosis,radiculopathy   COPD (chronic obstructive pulmonary disease) (HCC)    Hyperlipidemia    was on choloesterol meds last yr-samples given in office but not needed since   Hypertension    takes Exforge daily   Joint pain    Shortness of breath    takes Singulair daily;with exertion   Sickle cell trait (HCC)    Spondylolysis  Tobacco use     Past Surgical History:  Procedure Laterality Date   BACK SURGERY     BREAST EXCISIONAL BIOPSY Left 2007   benign   BREAST SURGERY Left    lumpectomy   COLONOSCOPY     DILATION AND CURETTAGE OF UTERUS     LUMBAR LAMINECTOMY/DECOMPRESSION MICRODISCECTOMY  06/04/2012   Procedure: LUMBAR LAMINECTOMY/DECOMPRESSION MICRODISCECTOMY 1 LEVEL;  Surgeon: Carmela Hurt, MD;  Location: [facility];  Service: Neurosurgery;  Laterality: Left;  LEFT Lumbar five-sacral one diskectomy   LUMBAR LAMINECTOMY/DECOMPRESSION MICRODISCECTOMY Right 06/16/2013   Procedure: RIGHT Lumbar Five-Sacral One Microdiskectomy;  Surgeon: Carmela Hurt, MD;  Location: [facility];  Service: Neurosurgery;  Laterality: Right;  RIGHT Lumbar Five-Sacral One Microdiskectomy   LUMBAR  LAMINECTOMY/DECOMPRESSION MICRODISCECTOMY Right 01/20/2014   Procedure: LUMBAR FOUR TO LUMBAR FIVE LUMBAR LAMINECTOMY/DECOMPRESSION MICRODISCECTOMY 1 LEVEL;  Surgeon: Carmela Hurt, MD;  Location: [facility];  Service: Neurosurgery;  Laterality: Right;  Right L45 laminectomy and foramintomy     Social History:  The patient  reports that she has been smoking cigarettes. She has a 5.00 pack-year smoking history. She has never used smokeless tobacco. She reports that she does not drink alcohol and does not use drugs.   Family History:  The patient's family history includes CAD in her father; Cancer - Other in her mother; Hypertension in her sister.    ROS:  Please see the history of present illness. All other systems are reviewed and  Negative to the above problem except as noted.    PHYSICAL EXAM: VS:  BP 126/84   Pulse 74   Ht 5\' 1"  (1.549 m)   Wt 92 lb 12.8 oz (42.1 kg)   LMP 03/17/2012   SpO2 97%   BMI 17.53 kg/m   GEN: Underweight 65 yo  in no acute distress  HEENT: normal  Neck: no JVD, carotid bruits Cardiac: RRR; no murmurs No LE  edema  Respiratory:  Bilateral rhonchi   Decrased airflow   GI: soft, nontender, nondistended, + BS  No hepatomegaly  MS: no deformity Moving all extremities   Skin: warm and dry, no rash Neuro:  Strength and sensation are intact Psych: euthymic mood, full affect   EKG:  EKG is ordered today.  SR 74 bpm      Lipid Panel    Component Value Date/Time   CHOL 204 (H) 01/01/2018 1223   TRIG 92 01/01/2018 1223   HDL 102 01/01/2018 1223   CHOLHDL 2.0 01/01/2018 1223   CHOLHDL 2.3 05/07/2016 0739   VLDL 20 05/07/2016 0739   LDLCALC 84 01/01/2018 1223      Wt Readings from Last 3 Encounters:  08/09/21 92 lb 12.8 oz (42.1 kg)  07/31/21 92 lb 9.6 oz (42 kg)  04/29/21 92 lb (41.7 kg)      ASSESSMENT AND PLAN:  1  CAD  PT with signif calcifications of LAD and LM on CT SHe is a very difficult historian   Has had "heartburn"   Very  difficult to assess activity level, I assume she is not too active  I would recomm a CT coronary angiogram to eval severity fo CAD      WIll need a statin  LDL 84  Start Crestor  2   COPD  Signifcant  Still smoking   COunselled on cessation  3  Lipdis   Tighter control  4  GI  Wt loss eval in progress   Hold until clear  by cardiology    Wll need continued follow up in cardiology    Current medicines are reviewed at length with the patient today.  The patient does not have concerns regarding medicines.  Signed, Dietrich Pates, MD  08/09/2021 11:24 AM    [facility] [address] [place], [place], [place]  [address] Phone: [phone]; Fax: [phone]

## 2021-08-10 LAB — BASIC METABOLIC PANEL
BUN/Creatinine Ratio: 9 — ABNORMAL LOW (ref 12–28)
BUN: 10 mg/dL (ref 8–27)
CO2: 23 mmol/L (ref 20–29)
Calcium: 9.9 mg/dL (ref 8.7–10.3)
Chloride: 103 mmol/L (ref 96–106)
Creatinine, Ser: 1.07 mg/dL — ABNORMAL HIGH (ref 0.57–1.00)
Glucose: 80 mg/dL (ref 70–99)
Potassium: 3.5 mmol/L (ref 3.5–5.2)
Sodium: 143 mmol/L (ref 134–144)
eGFR: 57 mL/min/{1.73_m2} — ABNORMAL LOW (ref >59)

## 2021-08-10 NOTE — Telephone Encounter (Signed)
Requested Prescriptions  Pending Prescriptions Disp Refills  . buPROPion (WELLBUTRIN XL) 150 MG 24 hr tablet [Pharmacy Med Name: BUPROPION XL 150MG  TABLETS (24 H)] 90 tablet 0    Sig: TAKE 1 TABLET(150 MG) BY MOUTH DAILY FOR SMOKING CESSATION     Psychiatry: Antidepressants - bupropion Passed - 08/09/2021  6:29 PM      Passed - Last BP in normal range    BP Readings from Last 1 Encounters:  08/09/21 126/84         Passed - Valid encounter within last 6 months    Recent Outpatient Visits          3 months ago Weight loss, unintentional   [facility] [place], [place], [place]   8 months ago Odette Horns   [facility], MD   8 months ago Other chronic sinusitis   [employer] [facility] Flo Shanks, MD   1 year ago Right elbow pain   [facility] Hoy Register, MD   1 year ago Essential hypertension   [facility] Hoy Register, MD      Future Appointments            In 4 months Hoy Register, MD [facility] [address] Office, [facility]           . benzonatate (TESSALON) 100 MG capsule [Pharmacy Med Name: BENZONATATE 100MG  CAPSULES] 20 capsule 0    Sig: TAKE 1 CAPSULE(100 MG) BY MOUTH TWICE DAILY     Ear, Nose, and Throat:  Antitussives/Expectorants Passed - 08/09/2021  6:29 PM      Passed - Valid encounter within last 12 months    Recent Outpatient Visits          3 months ago Weight loss, unintentional   [facility] [facility] , [place]   8 months ago Thyromegaly   [facility] Hoy Register, MD   8 months ago Other chronic sinusitis   [place] [facility], MD   1 year ago Right elbow pain   [facility] Flo Shanks, MD   1 year ago Essential hypertension   [facility] Hoy Register, MD      Future Appointments            In 4 months Hoy Register, Hoy Register, MD [facility] [address] Office, [facility]

## 2021-08-16 ENCOUNTER — Other Ambulatory Visit: Payer: Self-pay

## 2021-08-16 ENCOUNTER — Ambulatory Visit: Payer: Medicare Other | Attending: Family Medicine | Admitting: Pharmacist

## 2021-08-16 DIAGNOSIS — Z23 Encounter for immunization: Secondary | ICD-10-CM | POA: Diagnosis not present

## 2021-08-16 NOTE — Progress Notes (Signed)
Patient presents for vaccination against influenza per orders of Dr. Newlin. Consent given. Counseling provided. No contraindications exists. Vaccine administered without incident.   Luke Van Ausdall, PharmD, BCACP, CPP Clinical Pharmacist [facility] [phone]  

## 2021-08-21 ENCOUNTER — Telehealth (HOSPITAL_COMMUNITY): Payer: Self-pay | Admitting: *Deleted

## 2021-08-21 NOTE — Telephone Encounter (Signed)
Attempted to call patient regarding upcoming cardiac CT appointment. °Left message on voicemail with name and callback number ° °Breaunna Gottlieb RN Navigator Cardiac Imaging °[facility] °[phone] Office °[phone] Cell ° °

## 2021-08-22 ENCOUNTER — Encounter: Payer: Self-pay | Admitting: Family Medicine

## 2021-08-22 ENCOUNTER — Ambulatory Visit (HOSPITAL_COMMUNITY)
Admission: RE | Admit: 2021-08-22 | Discharge: 2021-08-22 | Disposition: A | Discharge status: Home / Self Care | Payer: Medicare Other | Source: Ambulatory Visit | Attending: Internal Medicine | Admitting: Internal Medicine

## 2021-08-22 ENCOUNTER — Other Ambulatory Visit: Payer: Self-pay

## 2021-08-22 DIAGNOSIS — I2510 Atherosclerotic heart disease of native coronary artery without angina pectoris: Secondary | ICD-10-CM | POA: Diagnosis not present

## 2021-08-22 DIAGNOSIS — Z79899 Other long term (current) drug therapy: Secondary | ICD-10-CM

## 2021-08-22 MED ORDER — NITROGLYCERIN 0.4 MG SL SUBL
0.8000 mg | SUBLINGUAL_TABLET | Freq: Once | SUBLINGUAL | Status: AC
  Administered 2021-08-22: 0.8 mg via SUBLINGUAL

## 2021-08-22 MED ORDER — IOHEXOL 350 MG/ML SOLN
100.0000 mL | Freq: Once | INTRAVENOUS | Status: AC | PRN
  Administered 2021-08-22: 100 mL via INTRAVENOUS

## 2021-08-22 MED ORDER — NITROGLYCERIN 0.4 MG SL SUBL
SUBLINGUAL_TABLET | SUBLINGUAL | Status: AC
  Filled 2021-08-22: qty 2

## 2021-08-22 NOTE — Telephone Encounter (Signed)
CT coronary angiogram shows mild plaquing of coronary arteries   I do not think this should be causing symptoms     I would keep on current meds   May be reflux    Just started cholesterol medicine   Will need lipids followed in 2 months

## 2021-08-26 ENCOUNTER — Other Ambulatory Visit (HOSPITAL_COMMUNITY): Payer: Self-pay

## 2021-08-26 ENCOUNTER — Other Ambulatory Visit: Payer: Self-pay | Admitting: Family Medicine

## 2021-08-26 ENCOUNTER — Telehealth: Payer: Self-pay

## 2021-08-26 DIAGNOSIS — I2510 Atherosclerotic heart disease of native coronary artery without angina pectoris: Secondary | ICD-10-CM

## 2021-08-26 DIAGNOSIS — J328 Other chronic sinusitis: Secondary | ICD-10-CM

## 2021-08-26 DIAGNOSIS — E785 Hyperlipidemia, unspecified: Secondary | ICD-10-CM

## 2021-08-26 DIAGNOSIS — Z79899 Other long term (current) drug therapy: Secondary | ICD-10-CM

## 2021-08-26 MED ORDER — ASPIRIN EC 81 MG PO TBEC: 81.0000 mg | DELAYED_RELEASE_TABLET | Freq: Every day | ORAL | 3 refills | Status: AC

## 2021-08-26 NOTE — Telephone Encounter (Signed)
Requested Prescriptions  Pending Prescriptions Disp Refills  . benzonatate (TESSALON) 100 MG capsule [Pharmacy Med Name: BENZONATATE 100MG  CAPSULES] 20 capsule 0    Sig: TAKE 1 CAPSULE(100 MG) BY MOUTH TWICE DAILY     Ear, Nose, and Throat:  Antitussives/Expectorants Passed - 08/26/2021 11:54 AM      Passed - Valid encounter within last 12 months    Recent Outpatient Visits          3 months ago Weight loss, unintentional   [place] [facility] [place], [place], [place]   8 months ago Odette Horns   [facility], MD   8 months ago Other chronic sinusitis   [place] [facility] Flo Shanks, MD   1 year ago Right elbow pain   [place] [facility] Hoy Register, MD   1 year ago Essential hypertension   [place] [facility] Hoy Register, MD      Future Appointments            In 3 months Hoy Register, Tenny Craw, MD [facility] [address] Office, [facility]

## 2021-08-26 NOTE — Telephone Encounter (Signed)
Left a message for the pt to call back.   Added Lipomed to her already planned labs for 10/22/21  Added ASA 81 mg to her med list

## 2021-08-26 NOTE — Telephone Encounter (Signed)
-----   Message from Dietrich Pates V, MD sent at 08/22/2021 10:09 PM EST ----- CT scan shows mild CAD of coronary arteries    Need to keep tight control of lipids   Check lipomed panel, Lp(a) and ApoB Pt should be on ASA

## 2021-08-29 ENCOUNTER — Telehealth: Payer: Self-pay | Admitting: Gastroenterology

## 2021-08-29 NOTE — Telephone Encounter (Signed)
Inbound call from patient requesting CT scan results, please advise.

## 2021-08-30 NOTE — Telephone Encounter (Signed)
Tammy Schwartz,  Your CT did not show any concerning findings or abnormalities to account for your weight loss.  The CT did show a kidney stone and some kidney cysts, which are common incidental findings.  No intervention needed for kidney stones if no symptoms (severe flank pain).  Recommend staying hydrated.   Please let me know if there are further questions about your CT scan

## 2021-08-30 NOTE — Telephone Encounter (Signed)
Routed to [employer].

## 2021-08-30 NOTE — Telephone Encounter (Signed)
Left message for pt to call back.  Spoke with pt and she is aware of CT results. Pt states she has seen her cardiologist and she is ready to schedule her procedure.  Dr. Tenny Craw is pt cleared from a cardiac standpoint to proceed with EGD? Please advise.

## 2021-09-02 ENCOUNTER — Telehealth: Payer: Self-pay

## 2021-09-02 DIAGNOSIS — Z79899 Other long term (current) drug therapy: Secondary | ICD-10-CM

## 2021-09-02 DIAGNOSIS — I2510 Atherosclerotic heart disease of native coronary artery without angina pectoris: Secondary | ICD-10-CM

## 2021-09-02 DIAGNOSIS — E785 Hyperlipidemia, unspecified: Secondary | ICD-10-CM

## 2021-09-02 NOTE — Telephone Encounter (Signed)
Please see notes below, ok to schedule pt for previsit and egd.

## 2021-09-02 NOTE — Telephone Encounter (Signed)
-----   Message from Dietrich Pates V, MD sent at 08/22/2021 10:09 PM EST ----- CT scan shows mild CAD of coronary arteries    Need to keep tight control of lipids   Check lipomed panel, Lp(a) and ApoB Pt should be on ASA

## 2021-09-02 NOTE — Telephone Encounter (Signed)
The patient has been notified of the result and verbalized understanding.  All questions (if any) were answered.     

## 2021-09-03 NOTE — Telephone Encounter (Signed)
Called Patient and left VM to call back to schedule for EGD

## 2021-09-09 ENCOUNTER — Other Ambulatory Visit: Payer: Self-pay

## 2021-09-09 ENCOUNTER — Other Ambulatory Visit: Payer: Medicare Other | Admitting: *Deleted

## 2021-09-09 ENCOUNTER — Encounter: Payer: Self-pay | Admitting: Gastroenterology

## 2021-09-09 DIAGNOSIS — Z79899 Other long term (current) drug therapy: Secondary | ICD-10-CM

## 2021-09-09 DIAGNOSIS — I2510 Atherosclerotic heart disease of native coronary artery without angina pectoris: Secondary | ICD-10-CM

## 2021-09-09 DIAGNOSIS — E785 Hyperlipidemia, unspecified: Secondary | ICD-10-CM | POA: Diagnosis not present

## 2021-09-10 ENCOUNTER — Encounter: Payer: Self-pay | Admitting: Internal Medicine

## 2021-09-10 DIAGNOSIS — Z23 Encounter for immunization: Secondary | ICD-10-CM | POA: Diagnosis not present

## 2021-09-10 LAB — NMR, LIPOPROFILE
Cholesterol, Total: 159 mg/dL (ref 100–199)
HDL Particle Number: 49.8 umol/L (ref >=30.5)
HDL-C: 106 mg/dL (ref >39)
LDL Particle Number: 322 nmol/L (ref <1000)
LDL Size: 21.1 nm (ref >20.5)
LDL-C (NIH Calc): 34 mg/dL (ref 0–99)
LP-IR Score: 26 (ref <=45)
Small LDL Particle Number: <90 nmol/L (ref <=527)
Triglycerides: 111 mg/dL (ref 0–149)

## 2021-09-10 LAB — APOLIPOPROTEIN B: Apolipoprotein B: 34 mg/dL (ref <90)

## 2021-09-10 LAB — LIPOPROTEIN A (LPA): Lipoprotein (a): 82.4 nmol/L — ABNORMAL HIGH (ref <75.0)

## 2021-09-11 ENCOUNTER — Encounter: Payer: Self-pay | Admitting: Internal Medicine

## 2021-09-11 ENCOUNTER — Encounter: Payer: Self-pay | Admitting: Gastroenterology

## 2021-09-11 ENCOUNTER — Ambulatory Visit (AMBULATORY_SURGERY_CENTER): Payer: Medicare Other | Admitting: *Deleted

## 2021-09-11 ENCOUNTER — Other Ambulatory Visit: Payer: Self-pay

## 2021-09-11 VITALS — Ht 61.0 in | Wt 94.0 lb

## 2021-09-11 DIAGNOSIS — R634 Abnormal weight loss: Secondary | ICD-10-CM

## 2021-09-11 NOTE — Progress Notes (Signed)
Virtual pre visit completed over telephone, instructions mailed and sent through MyChart.  No egg or soy allergy known to patient  No issues known to pt with past sedation with any surgeries or procedures Patient denies ever being told they had issues or difficulty with intubation  No FH of Malignant Hyperthermia Pt is not on diet pills Pt is not on  home 02  Pt is not on blood thinners  Pt denies issues with constipation  No A fib or A flutter   Due to the COVID-19 pandemic we are asking patients to follow certain guidelines in [place] and the [facility]   Pt aware of COVID protocols and [facility] guidelines

## 2021-10-01 ENCOUNTER — Encounter: Payer: Self-pay | Admitting: Gastroenterology

## 2021-10-01 ENCOUNTER — Ambulatory Visit (AMBULATORY_SURGERY_CENTER): Payer: Medicare Other | Admitting: Gastroenterology

## 2021-10-01 ENCOUNTER — Other Ambulatory Visit: Payer: Self-pay

## 2021-10-01 ENCOUNTER — Other Ambulatory Visit: Payer: Self-pay | Admitting: Gastroenterology

## 2021-10-01 VITALS — BP 114/65 | HR 76 | Temp 97.9°F | Resp 13 | Ht 61.0 in | Wt 93.0 lb

## 2021-10-01 DIAGNOSIS — K259 Gastric ulcer, unspecified as acute or chronic, without hemorrhage or perforation: Secondary | ICD-10-CM

## 2021-10-01 DIAGNOSIS — R634 Abnormal weight loss: Secondary | ICD-10-CM

## 2021-10-01 DIAGNOSIS — K449 Diaphragmatic hernia without obstruction or gangrene: Secondary | ICD-10-CM | POA: Diagnosis not present

## 2021-10-01 DIAGNOSIS — K2950 Unspecified chronic gastritis without bleeding: Secondary | ICD-10-CM | POA: Diagnosis not present

## 2021-10-01 DIAGNOSIS — K2970 Gastritis, unspecified, without bleeding: Secondary | ICD-10-CM | POA: Diagnosis not present

## 2021-10-01 MED ORDER — PANTOPRAZOLE SODIUM 40 MG PO TBEC: 40.0000 mg | DELAYED_RELEASE_TABLET | Freq: Two times a day (BID) | ORAL | 1 refills | Status: DC

## 2021-10-01 MED ORDER — SODIUM CHLORIDE 0.9 % IV SOLN: 500.0000 mL | Freq: Once | INTRAVENOUS | Status: DC

## 2021-10-01 NOTE — Progress Notes (Signed)
Pt's states no medical or surgical changes since previsit or office visit. 

## 2021-10-01 NOTE — Progress Notes (Signed)
Pt in recovery with monitors in place, VSS. Report given to receiving RN. Bite guard was placed with pt awake to ensure comfort. No dental or soft tissue damage noted. 

## 2021-10-01 NOTE — Progress Notes (Signed)
[facility] History and Physical   Primary Care Physician:  Hoy Register, MD   Reason for Procedure:   Unintentional weight loss  Plan:    EGD     HPI: Samyiah Halvorsen is a 65 y.o. female undergoing EGD to evaluate unintentional weight loss. She denies any chronic upper GI symptoms such as dysphagia, dyspepsia, nausea/vomiting, abdominal pain, just decreased appetite and early satiety.  Past Medical History:  Diagnosis Date   Chronic back pain    HNp,spondylosis,radiculopathy   COPD (chronic obstructive pulmonary disease) (HCC)    GERD (gastroesophageal reflux disease)    Hyperlipidemia    was on choloesterol meds last yr-samples given in office but not needed since   Hypertension    takes Exforge daily   Joint pain    Shortness of breath    takes Singulair daily;with exertion   Sickle cell trait (HCC)    Spondylolysis    Tobacco use     Past Surgical History:  Procedure Laterality Date   BACK SURGERY     BREAST EXCISIONAL BIOPSY Left 2007   benign   BREAST SURGERY Left    lumpectomy   COLONOSCOPY     DILATION AND CURETTAGE OF UTERUS     LUMBAR LAMINECTOMY/DECOMPRESSION MICRODISCECTOMY  06/04/2012   Procedure: LUMBAR LAMINECTOMY/DECOMPRESSION MICRODISCECTOMY 1 LEVEL;  Surgeon: Carmela Hurt, MD;  Location: [facility];  Service: Neurosurgery;  Laterality: Left;  LEFT Lumbar five-sacral one diskectomy   LUMBAR LAMINECTOMY/DECOMPRESSION MICRODISCECTOMY Right 06/16/2013   Procedure: RIGHT Lumbar Five-Sacral One Microdiskectomy;  Surgeon: Carmela Hurt, MD;  Location: [facility];  Service: Neurosurgery;  Laterality: Right;  RIGHT Lumbar Five-Sacral One Microdiskectomy   LUMBAR LAMINECTOMY/DECOMPRESSION MICRODISCECTOMY Right 01/20/2014   Procedure: LUMBAR FOUR TO LUMBAR FIVE LUMBAR LAMINECTOMY/DECOMPRESSION MICRODISCECTOMY 1 LEVEL;  Surgeon: Carmela Hurt, MD;  Location: [facility];  Service: Neurosurgery;  Laterality: Right;  Right L45  laminectomy and foramintomy    Prior to Admission medications   Medication Sig Start Date End Date Taking? Authorizing Provider  albuterol (VENTOLIN HFA) 108 (90 Base) MCG/ACT inhaler INHALE 2 PUFFS INTO THE LUNGS EVERY 6 HOURS AS NEEDED 08/06/20  Yes Marcine Matar, MD  amLODipine (NORVASC) 10 MG tablet TAKE 1 TABLET (10 MG TOTAL) BY MOUTH DAILY. 04/29/21 04/29/22 Yes Hoy Register, MD  aspirin EC 81 MG tablet Take 1 tablet (81 mg total) by mouth daily. Swallow whole. 08/26/21  Yes Pricilla Riffle, MD  benzonatate (TESSALON) 100 MG capsule TAKE 1 CAPSULE(100 MG) BY MOUTH TWICE DAILY 08/26/21  Yes Newlin, Enobong, MD  buPROPion (WELLBUTRIN XL) 150 MG 24 hr tablet TAKE 1 TABLET(150 MG) BY MOUTH DAILY FOR SMOKING CESSATION 08/10/21  Yes Newlin, Enobong, MD  carvedilol (COREG) 3.125 MG tablet TAKE 1 TABLET (3.125 MG TOTAL) BY MOUTH 2 (TWO) TIMES DAILY WITH A MEAL. 04/29/21 04/29/22 Yes Newlin, Odette Horns, MD  mirtazapine (REMERON) 30 MG tablet TAKE 1 TABLET (30 MG TOTAL) BY MOUTH AT BEDTIME. 04/29/21 04/29/22 Yes Newlin, Enobong, MD  pantoprazole (PROTONIX) 40 MG tablet TAKE 1 TABLET (40 MG TOTAL) BY MOUTH DAILY. 08/02/21 08/02/22 Yes Hoy Register, MD  rosuvastatin (CRESTOR) 10 MG tablet Take 1 tablet (10 mg total) by mouth daily. 08/09/21 11/07/21 Yes Pricilla Riffle, MD  azelastine (ASTELIN) 0.1 % nasal spray Place 2 sprays into both nostrils 2 (two) times daily. 01/18/20   Cathie Hoops, Amy V, PA-C  cetirizine (ZYRTEC) 10 MG tablet TAKE 1 TABLET BY MOUTH DAILY. Patient not taking: Reported on  09/11/2021 11/09/20 11/09/21  Hoy Register, MD  dextromethorphan-guaiFENesin (MUCINEX DM) 30-600 MG 12hr tablet Take 1 tablet by mouth 2 (two) times daily. Patient not taking: Reported on 09/11/2021 10/16/20   Wieters, Hallie C, PA-C  DULoxetine (CYMBALTA) 60 MG capsule TAKE 1 CAPSULE (60 MG TOTAL) BY MOUTH DAILY. 12/05/20 12/05/21  Hoy Register, MD  lactulose (CHRONULAC) 10 GM/15ML solution Take 15 mLs (10 g total) by mouth  2 (two) times daily as needed for mild constipation. 06/23/17   Hoy Register, MD  meclizine (ANTIVERT) 25 MG tablet Take 1 tablet (25 mg total) by mouth 3 (three) times daily as needed for dizziness. 11/29/19   Hoy Register, MD  metoprolol tartrate (LOPRESSOR) 50 MG tablet Take 1 tablet (50 mg total) by mouth once for 1 dose. TAKE TWO HOURS PRIOR TO TESTING 08/09/21 08/09/21  Pricilla Riffle, MD  montelukast (SINGULAIR) 10 MG tablet Take 10 mg by mouth daily.  Patient not taking: Reported on 09/11/2021    [provider]  Multiple Vitamin (MULTIVITAMIN WITH MINERALS) TABS Take 1 tablet by mouth daily.    [provider]  polyethylene glycol powder (GLYCOLAX/MIRALAX) powder Take 17 g by mouth daily. Patient not taking: Reported on 09/11/2021 06/09/17   Hoy Register, MD  predniSONE (DELTASONE) 20 MG tablet TAKE 1 TABLET (20 MG TOTAL) BY MOUTH DAILY WITH BREAKFAST. Patient not taking: Reported on 09/11/2021 12/05/20 12/05/21  Hoy Register, MD  triamcinolone cream (KENALOG) 0.1 % APPLY 1 APPLICATION TOPICALLY 2 (TWO) TIMES DAILY. Patient not taking: Reported on 09/11/2021 04/04/20   Hoy Register, MD    Current Outpatient Medications  Medication Sig Dispense Refill   albuterol (VENTOLIN HFA) 108 (90 Base) MCG/ACT inhaler INHALE 2 PUFFS INTO THE LUNGS EVERY 6 HOURS AS NEEDED 36 g 2   amLODipine (NORVASC) 10 MG tablet TAKE 1 TABLET (10 MG TOTAL) BY MOUTH DAILY. 90 tablet 1   aspirin EC 81 MG tablet Take 1 tablet (81 mg total) by mouth daily. Swallow whole. 100 tablet 3   benzonatate (TESSALON) 100 MG capsule TAKE 1 CAPSULE(100 MG) BY MOUTH TWICE DAILY 20 capsule 0   buPROPion (WELLBUTRIN XL) 150 MG 24 hr tablet TAKE 1 TABLET(150 MG) BY MOUTH DAILY FOR SMOKING CESSATION 90 tablet 0   carvedilol (COREG) 3.125 MG tablet TAKE 1 TABLET (3.125 MG TOTAL) BY MOUTH 2 (TWO) TIMES DAILY WITH A MEAL. 180 tablet 1   mirtazapine (REMERON) 30 MG tablet TAKE 1 TABLET (30 MG TOTAL) BY MOUTH AT  BEDTIME. 90 tablet 1   pantoprazole (PROTONIX) 40 MG tablet TAKE 1 TABLET (40 MG TOTAL) BY MOUTH DAILY. 30 tablet 6   rosuvastatin (CRESTOR) 10 MG tablet Take 1 tablet (10 mg total) by mouth daily. 90 tablet 3   azelastine (ASTELIN) 0.1 % nasal spray Place 2 sprays into both nostrils 2 (two) times daily. 30 mL 0   cetirizine (ZYRTEC) 10 MG tablet TAKE 1 TABLET BY MOUTH DAILY. (Patient not taking: Reported on 09/11/2021) 30 tablet 2   dextromethorphan-guaiFENesin (MUCINEX DM) 30-600 MG 12hr tablet Take 1 tablet by mouth 2 (two) times daily. (Patient not taking: Reported on 09/11/2021) 20 tablet 0   DULoxetine (CYMBALTA) 60 MG capsule TAKE 1 CAPSULE (60 MG TOTAL) BY MOUTH DAILY. 90 capsule 1   lactulose (CHRONULAC) 10 GM/15ML solution Take 15 mLs (10 g total) by mouth 2 (two) times daily as needed for mild constipation. 946 mL 0   meclizine (ANTIVERT) 25 MG tablet Take 1 tablet (25 mg total)  by mouth 3 (three) times daily as needed for dizziness. 60 tablet 1   metoprolol tartrate (LOPRESSOR) 50 MG tablet Take 1 tablet (50 mg total) by mouth once for 1 dose. TAKE TWO HOURS PRIOR TO TESTING 1 tablet 0   montelukast (SINGULAIR) 10 MG tablet Take 10 mg by mouth daily.  (Patient not taking: Reported on 09/11/2021)     Multiple Vitamin (MULTIVITAMIN WITH MINERALS) TABS Take 1 tablet by mouth daily.     polyethylene glycol powder (GLYCOLAX/MIRALAX) powder Take 17 g by mouth daily. (Patient not taking: Reported on 09/11/2021) 3350 g 1   predniSONE (DELTASONE) 20 MG tablet TAKE 1 TABLET (20 MG TOTAL) BY MOUTH DAILY WITH BREAKFAST. (Patient not taking: Reported on 09/11/2021) 5 tablet 0   triamcinolone cream (KENALOG) 0.1 % APPLY 1 APPLICATION TOPICALLY 2 (TWO) TIMES DAILY. (Patient not taking: Reported on 09/11/2021) 80 g 0   Current Facility-Administered Medications  Medication Dose Route Frequency Provider Last Rate Last Admin   0.9 %  sodium chloride infusion  500 mL Intravenous Once Jenel Lucks,  MD        Allergies as of 10/01/2021   (No Known Allergies)    Family History  Problem Relation Age of Onset   Stomach cancer Mother    Cancer - Other Mother    CAD Father        Died age 43   Hypertension Sister     Social History   Socioeconomic History   Marital status: Married    Spouse name: Not on file   Number of children: Not on file   Years of education: Not on file   Highest education level: Not on file  Occupational History   Not on file  Tobacco Use   Smoking status: Every Day    Packs/day: 0.25    Years: 20.00    Pack years: 5.00    Types: Cigarettes   Smokeless tobacco: Never  Vaping Use   Vaping Use: Never used  Substance and Sexual Activity   Alcohol use: No   Drug use: No   Sexual activity: Yes    Birth control/protection: Post-menopausal  Other Topics Concern   Not on file  Social History Narrative   Lives with husband and son.    Social Determinants of Health   Financial Resource Strain: High Risk   Difficulty of Paying Living Expenses: Hard  Food Insecurity: No Food Insecurity   Worried About Programme researcher, broadcasting/film/video in the Last Year: Never true   Ran Out of Food in the Last Year: Never true  Transportation Needs: No Transportation Needs   Lack of Transportation (Medical): No   Lack of Transportation (Non-Medical): No  Physical Activity: Inactive   Days of Exercise per Week: 0 days   Minutes of Exercise per Session: 0 min  Stress: Stress Concern Present   Feeling of Stress : To some extent  Social Connections: Press photographer of Communication with Friends and Family: More than three times a week   Frequency of Social Gatherings with Friends and Family: More than three times a week   Attends Religious Services: More than 4 times per year   Active Member of [employer] or Organizations: Yes   Attends Engineer, structural: More than 4 times per year   Marital Status: Married  Catering manager Violence: Not At Risk    Fear of Current or Ex-Partner: No   Emotionally Abused: No   Physically Abused: No  Sexually Abused: No    Review of Systems:  All other review of systems negative except as mentioned in the HPI.  Physical Exam: Vital signs BP (!) 165/84    Pulse 75    Temp 97.9 F (36.6 C)    Ht  (1.549 m)    Wt 93 lb (42.2 kg)    LMP 03/17/2012    SpO2 96%    BMI 17.57 kg/m   General:   Alert,  Well-developed, well-nourished, pleasant and cooperative in NAD Airway:  Mallampati 2 Lungs:  Clear throughout to auscultation.   Heart:  Regular rate and rhythm; no murmurs, clicks, rubs,  or gallops. Abdomen:  Soft, nontender and nondistended. Normal bowel sounds.   Neuro/Psych:  Normal mood and affect. A and O x 3   Conor Lata E. Tomasa Rand, MD [facility]

## 2021-10-01 NOTE — Patient Instructions (Signed)
Await pathology  Take Protonix 40 mg twice daily- before supper and dinner for 8 weeks!!!!  Next EGD 11-26-21 at 11:30 am, arrive at 10:30   Please avoid NSAIDs if possible (Advil, Aleve, Naproxen, Motrin)   YOU HAD AN ENDOSCOPIC PROCEDURE TODAY AT THE [facility]:   Refer to the procedure report that was given to you for any specific questions about what was found during the examination.  If the procedure report does not answer your questions, please call your gastroenterologist to clarify.  If you requested that your care partner not be given the details of your procedure findings, then the procedure report has been included in a sealed envelope for you to review at your convenience later.  YOU SHOULD EXPECT: Some feelings of bloating in the abdomen. Passage of more gas than usual.  Walking can help get rid of the air that was put into your GI tract during the procedure and reduce the bloating.   Please Note:  You might notice some irritation and congestion in your nose or some drainage.  This is from the oxygen used during your procedure.  There is no need for concern and it should clear up in a day or so.  SYMPTOMS TO REPORT IMMEDIATELY:  Following upper endoscopy (EGD)  Vomiting of blood or coffee ground material  New chest pain or pain under the shoulder blades  Painful or persistently difficult swallowing  New shortness of breath  Fever of 100F or higher  Black, tarry-looking stools  For urgent or emergent issues, a gastroenterologist can be reached at any hour by calling [phone]. Do not use MyChart messaging for urgent concerns.    DIET:  We do recommend a small meal at first, but then you may proceed to your regular diet.  Drink plenty of fluids but you should avoid alcoholic beverages for 24 hours.  ACTIVITY:  You should plan to take it easy for the rest of today and you should NOT DRIVE or use heavy machinery until tomorrow (because of the sedation  medicines used during the test).    FOLLOW UP: Our staff will call the number listed on your records 48-72 hours following your procedure to check on you and address any questions or concerns that you may have regarding the information given to you following your procedure. If we do not reach you, we will leave a message.  We will attempt to reach you two times.  During this call, we will ask if you have developed any symptoms of COVID 19. If you develop any symptoms (ie: fever, flu-like symptoms, shortness of breath, cough etc.) before then, please call [phone].  If you test positive for Covid 19 in the 2 weeks post procedure, please call and report this information to [place].    If any biopsies were taken you will be contacted by phone or by letter within the next 1-3 weeks.  Please call us at [phone] if you have not heard about the biopsies in 3 weeks.    SIGNATURES/CONFIDENTIALITY: You and/or your care partner have signed paperwork which will be entered into your electronic medical record.  These signatures attest to the fact that that the information above on your After Visit Summary has been reviewed and is understood.  Full responsibility of the confidentiality of this discharge information lies with you and/or your care-partner.

## 2021-10-01 NOTE — Op Note (Signed)
[facility] Patient Name: Tammy Schwartz Procedure Date: 10/01/2021 4:07 PM MRN: [id number] Endoscopist: Nicki Reaper E. Candis Schatz , MD Age: 65 Referring MD:  Date of Birth: 05-06-1956 Gender: Female Account #: [id number] Procedure:                Upper GI endoscopy Indications:              Weight loss Medicines:                Monitored Anesthesia Care Procedure:                Pre-Anesthesia Assessment:                           - Prior to the procedure, a History and Physical                            was performed, and patient medications and                            allergies were reviewed. The patient's tolerance of                            previous anesthesia was also reviewed. The risks                            and benefits of the procedure and the sedation                            options and risks were discussed with the patient.                            All questions were answered, and informed consent                            was obtained. Prior Anticoagulants: The patient has                            taken no previous anticoagulant or antiplatelet                            agents except for aspirin. ASA Grade Assessment:                            III - A patient with severe systemic disease. After                            reviewing the risks and benefits, the patient was                            deemed in satisfactory condition to undergo the                            procedure.  After obtaining informed consent, the endoscope was                            passed under direct vision. Throughout the                            procedure, the patient's blood pressure, pulse, and                            oxygen saturations were monitored continuously. The                            Endoscope was introduced through the mouth, and                            advanced to the fourth part of duodenum. The upper                             GI endoscopy was accomplished without difficulty.                            The patient tolerated the procedure well. Scope In: Scope Out: Findings:                 The examined portions of the nasopharynx,                            oropharynx and larynx were normal.                           The examined esophagus was normal.                           A 4 cm hiatal hernia was present.                           Many non-bleeding superficial gastric ulcers with                            no stigmata of bleeding were found in the gastric                            antrum. The largest lesion was 7 mm in largest                            dimension. Biopsies were taken with a cold forceps                            for Helicobacter pylori testing. Estimated blood                            loss was minimal.                           The exam of the stomach  was otherwise normal.                           The examined duodenum was normal. Complications:            No immediate complications. Estimated Blood Loss:     Estimated blood loss was minimal. Impression:               - The examined portions of the nasopharynx,                            oropharynx and larynx were normal.                           - Normal esophagus.                           - 4 cm hiatal hernia.                           - Non-bleeding gastric ulcers with no stigmata of                            bleeding. Biopsied.                           - Normal examined duodenum. Recommendation:           - Patient has a contact number available for                            emergencies. The signs and symptoms of potential                            delayed complications were discussed with the                            patient. Return to normal activities tomorrow.                            Written discharge instructions were provided to the                            patient.                           -  Resume previous diet.                           - Use Protonix (pantoprazole) 40 mg PO BID for 8                            weeks.                           - Await pathology results.                           -  Repeat upper endoscopy in 8 weeks to check                            healing.                           - Avoid NSAIDs if possible Bret Vanessen E. Tomasa Rand, MD 10/01/2021 4:34:26 PM This report has been signed electronically.

## 2021-10-01 NOTE — Progress Notes (Signed)
Called to room to assist during endoscopic procedure.  Patient ID and intended procedure confirmed with present staff. Received instructions for my participation in the procedure from the performing physician.  

## 2021-10-02 ENCOUNTER — Other Ambulatory Visit: Payer: Self-pay | Admitting: Family Medicine

## 2021-10-02 DIAGNOSIS — J328 Other chronic sinusitis: Secondary | ICD-10-CM

## 2021-10-02 NOTE — Telephone Encounter (Signed)
Requested Prescriptions  Pending Prescriptions Disp Refills   benzonatate (TESSALON) 100 MG capsule [Pharmacy Med Name: BENZONATATE 100MG  CAPSULES] 20 capsule 0    Sig: TAKE 1 CAPSULE(100 MG) BY MOUTH TWICE DAILY     Ear, Nose, and Throat:  Antitussives/Expectorants Passed - 10/02/2021  8:30 PM      Passed - Valid encounter within last 12 months    Recent Outpatient Visits          5 months ago Weight loss, unintentional   [facility] [place], [place], MD   9 months ago [facility] [employer], MD   10 months ago Other chronic sinusitis   [facility] Flo Shanks, MD   1 year ago Right elbow pain   [facility] Hoy Register, MD   1 year ago Essential hypertension    [facility] Hoy [facility], [facility]      Future Appointments            In 2 months Hoy Register, Tenny Craw, MD [facility] [address] Office, [facility]

## 2021-10-03 ENCOUNTER — Telehealth: Payer: Self-pay

## 2021-10-03 NOTE — Telephone Encounter (Signed)
°  Follow up Call-  Call back number 10/01/2021  Post procedure Call Back phone  # [phone]  Permission to leave phone message Yes  Some recent data might be hidden     Patient questions:  Do you have a fever, pain , or abdominal swelling? No. Pain Score  0 *  Have you tolerated food without any problems? Yes.    Have you been able to return to your normal activities? Yes.    Do you have any questions about your discharge instructions: Diet   No. Medications  No. Follow up visit  No.  Do you have questions or concerns about your Care? No.  Actions: * If pain score is 4 or above: No action needed, pain <4.  Have you developed a fever since your procedure? no  2.   Have you had an respiratory symptoms (SOB or cough) since your procedure? no  3.   Have you tested positive for COVID 19 since your procedure no  4.   Have you had any family members/close contacts diagnosed with the COVID 19 since your procedure?  no   If yes to any of these questions please route to Laverna Peace, RN and Karlton Lemon, RN

## 2021-10-09 ENCOUNTER — Other Ambulatory Visit: Payer: Self-pay | Admitting: Family Medicine

## 2021-10-09 DIAGNOSIS — G4709 Other insomnia: Secondary | ICD-10-CM

## 2021-10-09 NOTE — Progress Notes (Signed)
Ms. Capurro,  The biopsies taken from your stomach were notable for mild chronic gastritis (inflammation) which is a common finding, but there was no evidence of Helicobacter pylori infection. I suspect the inflammation is secondary to the pain medications as we discussed.  Please continue to take the acid suppressing medication as prescribed and repeat the upper endoscopy in 8 weeks.  Please use Tylenol preferentially for pain, as this does not cause irritation of the stomach lining.  Marlena Barbato E. Tomasa Rand, MD [facility]

## 2021-10-17 ENCOUNTER — Other Ambulatory Visit: Payer: Self-pay | Admitting: Family Medicine

## 2021-10-17 DIAGNOSIS — Z720 Tobacco use: Secondary | ICD-10-CM

## 2021-10-22 ENCOUNTER — Other Ambulatory Visit: Payer: Medicare Other

## 2021-10-25 ENCOUNTER — Other Ambulatory Visit: Payer: Self-pay | Admitting: Family Medicine

## 2021-10-25 DIAGNOSIS — Z1231 Encounter for screening mammogram for malignant neoplasm of breast: Secondary | ICD-10-CM

## 2021-11-08 ENCOUNTER — Other Ambulatory Visit (HOSPITAL_COMMUNITY): Payer: Self-pay

## 2021-11-12 ENCOUNTER — Ambulatory Visit
Admission: RE | Admit: 2021-11-12 | Discharge: 2021-11-12 | Disposition: A | Discharge status: Home / Self Care | Payer: Medicare Other | Source: Ambulatory Visit | Attending: Family Medicine | Admitting: Family Medicine

## 2021-11-12 DIAGNOSIS — Z1231 Encounter for screening mammogram for malignant neoplasm of breast: Secondary | ICD-10-CM

## 2021-11-14 ENCOUNTER — Other Ambulatory Visit: Payer: Self-pay | Admitting: Family Medicine

## 2021-11-14 ENCOUNTER — Telehealth: Payer: Self-pay

## 2021-11-14 DIAGNOSIS — Z720 Tobacco use: Secondary | ICD-10-CM

## 2021-11-14 NOTE — Telephone Encounter (Signed)
-----   Message from Hoy Register, MD sent at 11/13/2021 12:32 PM EST ----- Please inform her that her mammogram is normal

## 2021-11-14 NOTE — Telephone Encounter (Signed)
Patient name and DOB has been verified Patient was informed of lab results. Patient had no questions.  

## 2021-11-14 NOTE — Telephone Encounter (Signed)
Requested medication (s) are due for refill today: Yes  Requested medication (s) are on the active medication list: Yes  Last refill:  10/17/21  Future visit scheduled: No  Notes to clinic:  Left message to call and make appointment.    Requested Prescriptions  Pending Prescriptions Disp Refills   buPROPion (WELLBUTRIN XL) 150 MG 24 hr tablet [Pharmacy Med Name: BUPROPION XL 150MG  TABLETS (24 H)] 30 tablet 0    Sig: TAKE 1 TABLET(150 MG) BY MOUTH DAILY FOR SMOKING CESSATION     Psychiatry: Antidepressants - bupropion Failed - 11/14/2021 11:24 AM      Failed - Cr in normal range and within 360 days    Creatinine, Ser  Date Value Ref Range Status  08/09/2021 1.07 (H) 0.57 - 1.00 mg/dL Final          Failed - Valid encounter within last 6 months    Recent Outpatient Visits           6 months ago Weight loss, unintentional   [facility] [place], [place], MD   11 months ago [facility] [employer], MD   11 months ago Other chronic sinusitis   [facility] Flo Shanks, MD   1 year ago Right elbow pain   [facility] [place], [place], MD   1 year ago Essential hypertension   [facility] [place], [place], MD       Future Appointments             In 1 month Odette Horns, MD [facility] [address] Office, [facility]            Passed - AST in normal range and within 360 days    AST  Date Value Ref Range Status  07/31/2021 21 0 - 37 U/L Final          Passed - ALT in normal range and within 360 days    ALT  Date Value Ref Range Status  07/31/2021 16 0 - 35 U/L Final          Passed - Last BP in normal range    BP Readings from Last 1 Encounters:  10/01/21 114/65

## 2021-11-22 ENCOUNTER — Encounter: Payer: Self-pay | Admitting: Gastroenterology

## 2021-11-26 ENCOUNTER — Ambulatory Visit (AMBULATORY_SURGERY_CENTER): Payer: Medicare Other | Admitting: Gastroenterology

## 2021-11-26 ENCOUNTER — Encounter: Payer: Self-pay | Admitting: Gastroenterology

## 2021-11-26 VITALS — BP 123/72 | HR 67 | Temp 96.9°F | Resp 14 | Ht 61.0 in | Wt 94.0 lb

## 2021-11-26 DIAGNOSIS — K449 Diaphragmatic hernia without obstruction or gangrene: Secondary | ICD-10-CM | POA: Diagnosis not present

## 2021-11-26 DIAGNOSIS — Z8719 Personal history of other diseases of the digestive system: Secondary | ICD-10-CM

## 2021-11-26 DIAGNOSIS — K259 Gastric ulcer, unspecified as acute or chronic, without hemorrhage or perforation: Secondary | ICD-10-CM

## 2021-11-26 MED ORDER — SODIUM CHLORIDE 0.9 % IV SOLN: 500.0000 mL | Freq: Once | INTRAVENOUS | Status: DC

## 2021-11-26 NOTE — Progress Notes (Signed)
Pt in recovery with monitors in place, VSS. Report given to receiving RN. Bite guard was placed with pt awake to ensure comfort. No dental or soft tissue damage noted. 

## 2021-11-26 NOTE — Progress Notes (Signed)
Pt's states no medical or surgical changes since previsit or office visit. 

## 2021-11-26 NOTE — Op Note (Signed)
[facility] Patient Name: Tammy Schwartz Procedure Date: 11/26/2021 11:23 AM MRN: [id number] Endoscopist: Nicki Reaper E. Candis Schatz , MD Age: 66 Referring MD:  Date of Birth: May 16, 1956 Gender: Female Account #: [id number] Procedure:                Upper GI endoscopy Indications:              Follow-up of gastric ulcer Medicines:                Monitored Anesthesia Care Procedure:                Pre-Anesthesia Assessment:                           - Prior to the procedure, a History and Physical                            was performed, and patient medications and                            allergies were reviewed. The patient's tolerance of                            previous anesthesia was also reviewed. The risks                            and benefits of the procedure and the sedation                            options and risks were discussed with the patient.                            All questions were answered, and informed consent                            was obtained. Prior Anticoagulants: The patient has                            taken no previous anticoagulant or antiplatelet                            agents. ASA Grade Assessment: III - A patient with                            severe systemic disease. After reviewing the risks                            and benefits, the patient was deemed in                            satisfactory condition to undergo the procedure.                           After obtaining informed consent, the endoscope was  passed under direct vision. Throughout the                            procedure, the patient's blood pressure, pulse, and                            oxygen saturations were monitored continuously. The                            Endoscope was introduced through the mouth, and                            advanced to the third part of duodenum. The upper                            GI endoscopy was  accomplished without difficulty.                            The patient tolerated the procedure well. Scope In: Scope Out: Findings:                 The examined esophagus was normal.                           A 4 cm hiatal hernia was present.                           Diffuse granular mucosa was found in the gastric                            antrum.                           The exam of the stomach was otherwise normal.                           The examined duodenum was normal. Complications:            No immediate complications. Estimated Blood Loss:     Estimated blood loss: none. Impression:               - Normal esophagus.                           - 4 cm hiatal hernia.                           - Granular gastric mucosa. The previously seen                            ulcers have healed                           - Normal examined duodenum.                           - No specimens collected. Recommendation:           -  Patient has a contact number available for                            emergencies. The signs and symptoms of potential                            delayed complications were discussed with the                            patient. Return to normal activities tomorrow.                            Written discharge instructions were provided to the                            patient.                           - Resume previous diet.                           - Continue present medications.                           - Okay to stop taking Protonix                           - Continue to avoid NSAIDs Azarius Lambson E. Candis Schatz, MD 11/26/2021 11:54:48 AM This report has been signed electronically.

## 2021-11-26 NOTE — Patient Instructions (Signed)
YOU HAD AN ENDOSCOPIC PROCEDURE TODAY AT THE [facility]:   Refer to the procedure report that was given to you for any specific questions about what was found during the examination.  If the procedure report does not answer your questions, please call your gastroenterologist to clarify.  If you requested that your care partner not be given the details of your procedure findings, then the procedure report has been included in a sealed envelope for you to review at your convenience later.  YOU SHOULD EXPECT: Some feelings of bloating in the abdomen. Passage of more gas than usual.  Walking can help get rid of the air that was put into your GI tract during the procedure and reduce the bloating. If you had a lower endoscopy (such as a colonoscopy or flexible sigmoidoscopy) you may notice spotting of blood in your stool or on the toilet paper. If you underwent a bowel prep for your procedure, you may not have a normal bowel movement for a few days.  Please Note:  You might notice some irritation and congestion in your nose or some drainage.  This is from the oxygen used during your procedure.  There is no need for concern and it should clear up in a day or so.  SYMPTOMS TO REPORT IMMEDIATELY:  Following upper endoscopy (EGD)  Vomiting of blood or coffee ground material  New chest pain or pain under the shoulder blades  Painful or persistently difficult swallowing  New shortness of breath  Fever of 100F or higher  Black, tarry-looking stools  For urgent or emergent issues, a gastroenterologist can be reached at any hour by calling [phone]. Do not use MyChart messaging for urgent concerns.    DIET:  We do recommend a small meal at first, but then you may proceed to your regular diet.  Drink plenty of fluids but you should avoid alcoholic beverages for 24 hours.  ACTIVITY:  You should plan to take it easy for the rest of today and you should NOT DRIVE or use heavy machinery until  tomorrow (because of the sedation medicines used during the test).    FOLLOW UP: Our staff will call the number listed on your records 48-72 hours following your procedure to check on you and address any questions or concerns that you may have regarding the information given to you following your procedure. If we do not reach you, we will leave a message.  We will attempt to reach you two times.  During this call, we will ask if you have developed any symptoms of COVID 19. If you develop any symptoms (ie: fever, flu-like symptoms, shortness of breath, cough etc.) before then, please call [phone].  If you test positive for Covid 19 in the 2 weeks post procedure, please call and report this information to us.    SIGNATURES/CONFIDENTIALITY: You and/or your care partner have signed paperwork which will be entered into your electronic medical record.  These signatures attest to the fact that that the information above on your After Visit Summary has been reviewed and is understood.  Full responsibility of the confidentiality of this discharge information lies with you and/or your care-partner.  

## 2021-11-26 NOTE — Progress Notes (Signed)
[facility] History and Physical   Primary Care Physician:  Hoy RegisterNewlin, Enobong, MD   Reason for Procedure:   Follow up gastric ulcers  Plan:    EGD     HPI: Tammy Schwartz is a 66 y.o. female undergoing repeat EGD to assess for healing of gastric ulcers.  She denies any chronic upper GI symptoms such as abdominal pain, nausea/vomiting.  She has lost weight in the past year, but has been stable in the past 6 months.  She has been taking Protonix 40 mg BID since her last EGD and denies NSAID use.   Past Medical History:  Diagnosis Date   Chronic back pain    HNp,spondylosis,radiculopathy   COPD (chronic obstructive pulmonary disease) (HCC)    GERD (gastroesophageal reflux disease)    Hyperlipidemia    was on choloesterol meds last yr-samples given in office but not needed since   Hypertension    takes Exforge daily   Joint pain    Shortness of breath    takes Singulair daily;with exertion   Sickle cell trait (HCC)    Spondylolysis    Tobacco use     Past Surgical History:  Procedure Laterality Date   BACK SURGERY     BREAST EXCISIONAL BIOPSY Left 2007   benign   BREAST SURGERY Left    lumpectomy   COLONOSCOPY     DILATION AND CURETTAGE OF UTERUS     LUMBAR LAMINECTOMY/DECOMPRESSION MICRODISCECTOMY  06/04/2012   Procedure: LUMBAR LAMINECTOMY/DECOMPRESSION MICRODISCECTOMY 1 LEVEL;  Surgeon: Carmela HurtKyle L Cabbell, MD;  Location: [facility];  Service: Neurosurgery;  Laterality: Left;  LEFT Lumbar five-sacral one diskectomy   LUMBAR LAMINECTOMY/DECOMPRESSION MICRODISCECTOMY Right 06/16/2013   Procedure: RIGHT Lumbar Five-Sacral One Microdiskectomy;  Surgeon: Carmela HurtKyle L Cabbell, MD;  Location: [facility];  Service: Neurosurgery;  Laterality: Right;  RIGHT Lumbar Five-Sacral One Microdiskectomy   LUMBAR LAMINECTOMY/DECOMPRESSION MICRODISCECTOMY Right 01/20/2014   Procedure: LUMBAR FOUR TO LUMBAR FIVE LUMBAR LAMINECTOMY/DECOMPRESSION MICRODISCECTOMY 1 LEVEL;  Surgeon:  Carmela HurtKyle L Cabbell, MD;  Location: [facility];  Service: Neurosurgery;  Laterality: Right;  Right L45 laminectomy and foramintomy    Prior to Admission medications   Medication Sig Start Date End Date Taking? Authorizing Provider  amLODipine (NORVASC) 10 MG tablet TAKE 1 TABLET (10 MG TOTAL) BY MOUTH DAILY. 04/29/21 04/29/22 Yes Hoy RegisterNewlin, Enobong, MD  aspirin EC 81 MG tablet Take 1 tablet (81 mg total) by mouth daily. Swallow whole. 08/26/21  Yes Pricilla Riffleoss, Paula V, MD  benzonatate (TESSALON) 100 MG capsule TAKE 1 CAPSULE(100 MG) BY MOUTH TWICE DAILY 10/02/21  Yes Newlin, Enobong, MD  buPROPion (WELLBUTRIN XL) 150 MG 24 hr tablet TAKE 1 TABLET(150 MG) BY MOUTH DAILY FOR SMOKING CESSATION 10/17/21  Yes Newlin, Enobong, MD  carvedilol (COREG) 3.125 MG tablet TAKE 1 TABLET (3.125 MG TOTAL) BY MOUTH 2 (TWO) TIMES DAILY WITH A MEAL. 04/29/21 04/29/22 Yes Newlin, Odette HornsEnobong, MD  mirtazapine (REMERON) 30 MG tablet TAKE 1 TABLET(30 MG) BY MOUTH AT BEDTIME 10/10/21  Yes Newlin, Enobong, MD  Multiple Vitamin (MULTIVITAMIN WITH MINERALS) TABS Take 1 tablet by mouth daily.   Yes [provider]  pantoprazole (PROTONIX) 40 MG tablet TAKE 1 TABLET(40 MG) BY MOUTH TWICE DAILY FOR 8 WEEKS 10/02/21  Yes Jenel Lucksunningham, Math Brazie E, MD  albuterol (VENTOLIN HFA) 108 (90 Base) MCG/ACT inhaler INHALE 2 PUFFS INTO THE LUNGS EVERY 6 HOURS AS NEEDED 08/06/20   Marcine MatarJohnson, Deborah B, MD  azelastine (ASTELIN) 0.1 % nasal spray Place 2 sprays  into both nostrils 2 (two) times daily. 01/18/20   Cathie Hoops, Amy V, PA-C  cetirizine (ZYRTEC) 10 MG tablet TAKE 1 TABLET BY MOUTH DAILY. Patient not taking: Reported on 09/11/2021 11/09/20 11/09/21  Hoy Register, MD  dextromethorphan-guaiFENesin [facility] DM) 30-600 MG 12hr tablet Take 1 tablet by mouth 2 (two) times daily. Patient not taking: Reported on 09/11/2021 10/16/20   Wieters, Hallie C, PA-C  DULoxetine (CYMBALTA) 60 MG capsule TAKE 1 CAPSULE (60 MG TOTAL) BY MOUTH DAILY. Patient not taking: Reported on  11/26/2021 12/05/20 12/05/21  Hoy Register, MD  lactulose (CHRONULAC) 10 GM/15ML solution Take 15 mLs (10 g total) by mouth 2 (two) times daily as needed for mild constipation. 06/23/17   Hoy Register, MD  meclizine (ANTIVERT) 25 MG tablet Take 1 tablet (25 mg total) by mouth 3 (three) times daily as needed for dizziness. 11/29/19   Hoy Register, MD  metoprolol tartrate (LOPRESSOR) 50 MG tablet Take 1 tablet (50 mg total) by mouth once for 1 dose. TAKE TWO HOURS PRIOR TO TESTING 08/09/21 08/09/21  Pricilla Riffle, MD  montelukast (SINGULAIR) 10 MG tablet Take 10 mg by mouth daily.  Patient not taking: Reported on 09/11/2021    [provider]  pantoprazole (PROTONIX) 40 MG tablet TAKE 1 TABLET (40 MG TOTAL) BY MOUTH DAILY. Patient not taking: Reported on 11/26/2021 08/02/21 08/02/22  Hoy Register, MD  polyethylene glycol powder (GLYCOLAX/MIRALAX) powder Take 17 g by mouth daily. Patient not taking: Reported on 09/11/2021 06/09/17   Hoy Register, MD  rosuvastatin (CRESTOR) 10 MG tablet Take 1 tablet (10 mg total) by mouth daily. 08/09/21 11/07/21  Pricilla Riffle, MD  triamcinolone cream (KENALOG) 0.1 % APPLY 1 APPLICATION TOPICALLY 2 (TWO) TIMES DAILY. Patient not taking: Reported on 09/11/2021 04/04/20   Hoy Register, MD    Current Outpatient Medications  Medication Sig Dispense Refill   amLODipine (NORVASC) 10 MG tablet TAKE 1 TABLET (10 MG TOTAL) BY MOUTH DAILY. 90 tablet 1   aspirin EC 81 MG tablet Take 1 tablet (81 mg total) by mouth daily. Swallow whole. 100 tablet 3   benzonatate (TESSALON) 100 MG capsule TAKE 1 CAPSULE(100 MG) BY MOUTH TWICE DAILY 20 capsule 0   buPROPion (WELLBUTRIN XL) 150 MG 24 hr tablet TAKE 1 TABLET(150 MG) BY MOUTH DAILY FOR SMOKING CESSATION 30 tablet 0   carvedilol (COREG) 3.125 MG tablet TAKE 1 TABLET (3.125 MG TOTAL) BY MOUTH 2 (TWO) TIMES DAILY WITH A MEAL. 180 tablet 1   mirtazapine (REMERON) 30 MG tablet TAKE 1 TABLET(30 MG) BY MOUTH AT  BEDTIME 90 tablet 1   Multiple Vitamin (MULTIVITAMIN WITH MINERALS) TABS Take 1 tablet by mouth daily.     pantoprazole (PROTONIX) 40 MG tablet TAKE 1 TABLET(40 MG) BY MOUTH TWICE DAILY FOR 8 WEEKS 180 tablet 1   albuterol (VENTOLIN HFA) 108 (90 Base) MCG/ACT inhaler INHALE 2 PUFFS INTO THE LUNGS EVERY 6 HOURS AS NEEDED 36 g 2   azelastine (ASTELIN) 0.1 % nasal spray Place 2 sprays into both nostrils 2 (two) times daily. 30 mL 0   cetirizine (ZYRTEC) 10 MG tablet TAKE 1 TABLET BY MOUTH DAILY. (Patient not taking: Reported on 09/11/2021) 30 tablet 2   dextromethorphan-guaiFENesin (MUCINEX DM) 30-600 MG 12hr tablet Take 1 tablet by mouth 2 (two) times daily. (Patient not taking: Reported on 09/11/2021) 20 tablet 0   DULoxetine (CYMBALTA) 60 MG capsule TAKE 1 CAPSULE (60 MG TOTAL) BY MOUTH DAILY. (Patient not taking: Reported on 11/26/2021) 90 capsule  1   lactulose (CHRONULAC) 10 GM/15ML solution Take 15 mLs (10 g total) by mouth 2 (two) times daily as needed for mild constipation. 946 mL 0   meclizine (ANTIVERT) 25 MG tablet Take 1 tablet (25 mg total) by mouth 3 (three) times daily as needed for dizziness. 60 tablet 1   metoprolol tartrate (LOPRESSOR) 50 MG tablet Take 1 tablet (50 mg total) by mouth once for 1 dose. TAKE TWO HOURS PRIOR TO TESTING 1 tablet 0   montelukast (SINGULAIR) 10 MG tablet Take 10 mg by mouth daily.  (Patient not taking: Reported on 09/11/2021)     pantoprazole (PROTONIX) 40 MG tablet TAKE 1 TABLET (40 MG TOTAL) BY MOUTH DAILY. (Patient not taking: Reported on 11/26/2021) 30 tablet 6   polyethylene glycol powder (GLYCOLAX/MIRALAX) powder Take 17 g by mouth daily. (Patient not taking: Reported on 09/11/2021) 3350 g 1   rosuvastatin (CRESTOR) 10 MG tablet Take 1 tablet (10 mg total) by mouth daily. 90 tablet 3   triamcinolone cream (KENALOG) 0.1 % APPLY 1 APPLICATION TOPICALLY 2 (TWO) TIMES DAILY. (Patient not taking: Reported on 09/11/2021) 80 g 0   Current Facility-Administered  Medications  Medication Dose Route Frequency Provider Last Rate Last Admin   0.9 %  sodium chloride infusion  500 mL Intravenous Once Jenel Lucks, MD        Allergies as of 11/26/2021   (No Known Allergies)    Family History  Problem Relation Age of Onset   Stomach cancer Mother    Cancer - Other Mother    CAD Father        Died age 44   Hypertension Sister     Social History   Socioeconomic History   Marital status: Married    Spouse name: Not on file   Number of children: Not on file   Years of education: Not on file   Highest education level: Not on file  Occupational History   Not on file  Tobacco Use   Smoking status: Every Day    Packs/day: 0.25    Years: 20.00    Pack years: 5.00    Types: Cigarettes   Smokeless tobacco: Never  Vaping Use   Vaping Use: Never used  Substance and Sexual Activity   Alcohol use: No   Drug use: No   Sexual activity: Yes    Birth control/protection: Post-menopausal  Other Topics Concern   Not on file  Social History Narrative   Lives with husband and son.    Social Determinants of Health   Financial Resource Strain: High Risk   Difficulty of Paying Living Expenses: Hard  Food Insecurity: No Food Insecurity   Worried About Programme researcher, broadcasting/film/video in the Last Year: Never true   Ran Out of Food in the Last Year: Never true  Transportation Needs: No Transportation Needs   Lack of Transportation (Medical): No   Lack of Transportation (Non-Medical): No  Physical Activity: Inactive   Days of Exercise per Week: 0 days   Minutes of Exercise per Session: 0 min  Stress: Stress Concern Present   Feeling of Stress : To some extent  Social Connections: Press photographer of Communication with Friends and Family: More than three times a week   Frequency of Social Gatherings with Friends and Family: More than three times a week   Attends Religious Services: More than 4 times per year   Active Member of [employer] or  Organizations: Yes  Attends Engineer, structural: More than 4 times per year   Marital Status: Married  Catering manager Violence: Not At Risk   Fear of Current or Ex-Partner: No   Emotionally Abused: No   Physically Abused: No   Sexually Abused: No    Review of Systems:  All other review of systems negative except as mentioned in the HPI.  Physical Exam: Vital signs BP (!) 156/81    Pulse 70    Temp (!) 96.9 F (36.1 C)    Ht 5\' 1"  (1.549 m)    Wt 94 lb (42.6 kg)    LMP 03/17/2012    SpO2 100%    BMI 17.76 kg/m   General:   Alert,  Well-developed, well-nourished, pleasant and cooperative in NAD Airway:  Mallampati 1 Lungs:  Clear throughout to auscultation.   Heart:  Regular rate and rhythm; no murmurs, clicks, rubs,  or gallops. Abdomen:  Soft, nontender and nondistended. Normal bowel sounds.   Neuro/Psych:  Normal mood and affect. A and O x 3   Mystie Ormand E. 05/17/2012, MD [facility]

## 2021-11-28 ENCOUNTER — Telehealth: Payer: Self-pay

## 2021-11-28 NOTE — Telephone Encounter (Signed)
°  Follow up Call-  Call back number 11/26/2021 10/01/2021  Post procedure Call Back phone  # [phone]  Permission to leave phone message Yes Yes  Some recent data might be hidden     Patient questions:  Do you have a fever, pain , or abdominal swelling? No. Pain Score  0 *  Have you tolerated food without any problems? Yes.    Have you been able to return to your normal activities? Yes.    Do you have any questions about your discharge instructions: Diet   No. Medications  No. Follow up visit  No.  Do you have questions or concerns about your Care? No.  Actions: * If pain score is 4 or above: No action needed, pain <4.   Have you developed a fever since your procedure? no  2.   Have you had an respiratory symptoms (SOB or cough) since your procedure? no  3.   Have you tested positive for COVID 19 since your procedure no  4.   Have you had any family members/close contacts diagnosed with the COVID 19 since your procedure?  no   If yes to any of these questions please route to Joylene John, RN and Joella Prince, RN

## 2021-12-02 ENCOUNTER — Other Ambulatory Visit: Payer: Self-pay | Admitting: Family Medicine

## 2021-12-02 ENCOUNTER — Other Ambulatory Visit: Payer: Self-pay | Admitting: Gastroenterology

## 2021-12-02 ENCOUNTER — Ambulatory Visit: Payer: Medicare Other | Attending: Family Medicine | Admitting: Family Medicine

## 2021-12-02 ENCOUNTER — Encounter: Payer: Self-pay | Admitting: Family Medicine

## 2021-12-02 VITALS — BP 173/76 | HR 70 | Ht 61.0 in | Wt 87.2 lb

## 2021-12-02 DIAGNOSIS — E440 Moderate protein-calorie malnutrition: Secondary | ICD-10-CM

## 2021-12-02 DIAGNOSIS — J449 Chronic obstructive pulmonary disease, unspecified: Secondary | ICD-10-CM | POA: Diagnosis not present

## 2021-12-02 DIAGNOSIS — I10 Essential (primary) hypertension: Secondary | ICD-10-CM | POA: Diagnosis not present

## 2021-12-02 DIAGNOSIS — Z131 Encounter for screening for diabetes mellitus: Secondary | ICD-10-CM

## 2021-12-02 DIAGNOSIS — Z720 Tobacco use: Secondary | ICD-10-CM | POA: Diagnosis not present

## 2021-12-02 DIAGNOSIS — K259 Gastric ulcer, unspecified as acute or chronic, without hemorrhage or perforation: Secondary | ICD-10-CM

## 2021-12-02 DIAGNOSIS — M5416 Radiculopathy, lumbar region: Secondary | ICD-10-CM

## 2021-12-02 DIAGNOSIS — L299 Pruritus, unspecified: Secondary | ICD-10-CM

## 2021-12-02 DIAGNOSIS — R634 Abnormal weight loss: Secondary | ICD-10-CM

## 2021-12-02 DIAGNOSIS — J328 Other chronic sinusitis: Secondary | ICD-10-CM

## 2021-12-02 MED ORDER — BENZONATATE 100 MG PO CAPS: ORAL_CAPSULE | ORAL | 2 refills | Status: DC

## 2021-12-02 MED ORDER — BUPROPION HCL ER (XL) 150 MG PO TB24: ORAL_TABLET | ORAL | 1 refills | Status: DC

## 2021-12-02 MED ORDER — PANTOPRAZOLE SODIUM 40 MG PO TBEC: DELAYED_RELEASE_TABLET | Freq: Every day | ORAL | 1 refills | Status: DC

## 2021-12-02 MED ORDER — AZELASTINE HCL 0.1 % NA SOLN: 2.0000 | Freq: Two times a day (BID) | NASAL | 0 refills | Status: DC

## 2021-12-02 MED ORDER — ALBUTEROL SULFATE HFA 108 (90 BASE) MCG/ACT IN AERS: INHALATION_SPRAY | RESPIRATORY_TRACT | 2 refills | Status: DC

## 2021-12-02 MED ORDER — CETIRIZINE HCL 10 MG PO TABS: ORAL_TABLET | Freq: Every day | ORAL | 1 refills | Status: DC

## 2021-12-02 MED ORDER — DULOXETINE HCL 60 MG PO CPEP: ORAL_CAPSULE | Freq: Every day | ORAL | 1 refills | Status: DC

## 2021-12-02 MED ORDER — CARVEDILOL 3.125 MG PO TABS: ORAL_TABLET | Freq: Two times a day (BID) | ORAL | 1 refills | Status: DC

## 2021-12-02 NOTE — Progress Notes (Signed)
Wants to know the cause of her weight loss.  Needs refills on medications.

## 2021-12-02 NOTE — Progress Notes (Signed)
Subjective:  Patient ID: Tammy Schwartz, female    DOB: Sep 09, 1956  Age: 66 y.o. MRN: [id number]  CC: Weight Loss   HPI Tammy Schwartz is a 66 y.o. year old female with a history of hypertension, tobacco abuse, COPD, lumbar spondylosis status post back surgery who presents today for follow-up visit.  Interval History: She complains of weight loss and was seen by GI - Dr Candis Schatz for additional work up. She has lost an additional 5 lbs since December. Weight loss started after she received the COVID vaccine per patient. States she feels good and has no symptoms GI treated her for peptic ulcers and per GI ulcers have resolved CT Abd and Pelvis 07/2021 revealed: IMPRESSION: 1. No acute process identified. 2. Left renal calculus.  Bilateral renal cysts. 3. Tiny hiatal hernia. 4. Possible fibroid uterus.    She eats a little bit at a time per patient. Denies vaginal bleed, abdominal  Malignancy screen: CT low dose chest -04/2021, benign  pulmonary nodule Colonoscopy - last done in 12/2014: 3 polyps resected (pathology - hyperplastic polyps) Mammo - 10/2021: negative for malignancy Upper Endoscopy from 10/2021 - Hiatal hernia, healed peptic ulcers  She took just one of her antihypertensive as she gets dizzy when she takes all 3. BP is elevated today. Her Bupropion has been effective and she has cut back significantly on smoking. COPD is stable but she has a chronic cough and requests Tessalon Perles refill. Past Medical History:  Diagnosis Date   Chronic back pain    HNp,spondylosis,radiculopathy   COPD (chronic obstructive pulmonary disease) (HCC)    GERD (gastroesophageal reflux disease)    Hyperlipidemia    was on choloesterol meds last yr-samples given in office but not needed since   Hypertension    takes Exforge daily   Joint pain    Shortness of breath    takes Singulair daily;with exertion   Sickle cell trait ([place])    Spondylolysis    Tobacco use      Past Surgical History:  Procedure Laterality Date   BACK SURGERY     BREAST EXCISIONAL BIOPSY Left 2007   benign   BREAST SURGERY Left    lumpectomy   COLONOSCOPY     DILATION AND CURETTAGE OF UTERUS     LUMBAR LAMINECTOMY/DECOMPRESSION MICRODISCECTOMY  06/04/2012   Procedure: LUMBAR LAMINECTOMY/DECOMPRESSION MICRODISCECTOMY 1 LEVEL;  Surgeon: Winfield Cunas, MD;  Location: [place] [facility];  Service: Neurosurgery;  Laterality: Left;  LEFT Lumbar five-sacral one diskectomy   LUMBAR LAMINECTOMY/DECOMPRESSION MICRODISCECTOMY Right 06/16/2013   Procedure: RIGHT Lumbar Five-Sacral One Microdiskectomy;  Surgeon: Winfield Cunas, MD;  Location: [facility];  Service: Neurosurgery;  Laterality: Right;  RIGHT Lumbar Five-Sacral One Microdiskectomy   LUMBAR LAMINECTOMY/DECOMPRESSION MICRODISCECTOMY Right 01/20/2014   Procedure: LUMBAR FOUR TO LUMBAR FIVE LUMBAR LAMINECTOMY/DECOMPRESSION MICRODISCECTOMY 1 LEVEL;  Surgeon: Winfield Cunas, MD;  Location: [place];  Service: Neurosurgery;  Laterality: Right;  Right L45 laminectomy and foramintomy    Family History  Problem Relation Age of Onset   Stomach cancer Mother    Cancer - Other Mother    CAD Father        Died age 10   Hypertension Sister     No Known Allergies  Outpatient Medications Prior to Visit  Medication Sig Dispense Refill   amLODipine (NORVASC) 10 MG tablet TAKE 1 TABLET (10 MG TOTAL) BY MOUTH DAILY. 90 tablet 1   aspirin EC 81 MG tablet Take 1  tablet (81 mg total) by mouth daily. Swallow whole. 100 tablet 3   dextromethorphan-guaiFENesin (MUCINEX DM) 30-600 MG 12hr tablet Take 1 tablet by mouth 2 (two) times daily. 20 tablet 0   lactulose (CHRONULAC) 10 GM/15ML solution Take 15 mLs (10 g total) by mouth 2 (two) times daily as needed for mild constipation. 946 mL 0   meclizine (ANTIVERT) 25 MG tablet Take 1 tablet (25 mg total) by mouth 3 (three) times daily as needed for dizziness. 60 tablet 1   mirtazapine (REMERON) 30 MG  tablet TAKE 1 TABLET(30 MG) BY MOUTH AT BEDTIME 90 tablet 1   montelukast (SINGULAIR) 10 MG tablet Take 10 mg by mouth daily.     Multiple Vitamin (MULTIVITAMIN WITH MINERALS) TABS Take 1 tablet by mouth daily.     polyethylene glycol powder (GLYCOLAX/MIRALAX) powder Take 17 g by mouth daily. 3350 g 1   triamcinolone cream (KENALOG) 0.1 % APPLY 1 APPLICATION TOPICALLY 2 (TWO) TIMES DAILY. 80 g 0   albuterol (VENTOLIN HFA) 108 (90 Base) MCG/ACT inhaler INHALE 2 PUFFS INTO THE LUNGS EVERY 6 HOURS AS NEEDED 36 g 2   azelastine (ASTELIN) 0.1 % nasal spray Place 2 sprays into both nostrils 2 (two) times daily. 30 mL 0   benzonatate (TESSALON) 100 MG capsule TAKE 1 CAPSULE(100 MG) BY MOUTH TWICE DAILY 20 capsule 0   buPROPion (WELLBUTRIN XL) 150 MG 24 hr tablet TAKE 1 TABLET(150 MG) BY MOUTH DAILY FOR SMOKING CESSATION 30 tablet 0   carvedilol (COREG) 3.125 MG tablet TAKE 1 TABLET (3.125 MG TOTAL) BY MOUTH 2 (TWO) TIMES DAILY WITH A MEAL. 180 tablet 1   DULoxetine (CYMBALTA) 60 MG capsule TAKE 1 CAPSULE (60 MG TOTAL) BY MOUTH DAILY. 90 capsule 1   pantoprazole (PROTONIX) 40 MG tablet TAKE 1 TABLET (40 MG TOTAL) BY MOUTH DAILY. 30 tablet 6   pantoprazole (PROTONIX) 40 MG tablet TAKE 1 TABLET(40 MG) BY MOUTH TWICE DAILY FOR 8 WEEKS 180 tablet 1   rosuvastatin (CRESTOR) 10 MG tablet Take 1 tablet (10 mg total) by mouth daily. 90 tablet 3   cetirizine (ZYRTEC) 10 MG tablet TAKE 1 TABLET BY MOUTH DAILY. (Patient not taking: Reported on 09/11/2021) 30 tablet 2   metoprolol tartrate (LOPRESSOR) 50 MG tablet Take 1 tablet (50 mg total) by mouth once for 1 dose. TAKE TWO HOURS PRIOR TO TESTING 1 tablet 0   No facility-administered medications prior to visit.     ROS Review of Systems  Constitutional:  Positive for unexpected weight change. Negative for activity change, appetite change and fatigue.  HENT:  Negative for congestion, sinus pressure and sore throat.   Eyes:  Negative for visual disturbance.   Respiratory:  Positive for cough. Negative for chest tightness, shortness of breath and wheezing.   Cardiovascular:  Negative for chest pain and palpitations.  Gastrointestinal:  Negative for abdominal distention, abdominal pain and constipation.  Endocrine: Negative for polydipsia.  Genitourinary:  Negative for dysuria and frequency.  Musculoskeletal:  Negative for arthralgias and back pain.  Skin:  Negative for rash.  Neurological:  Negative for tremors, light-headedness and numbness.  Hematological:  Does not bruise/bleed easily.  Psychiatric/Behavioral:  Negative for agitation and behavioral problems.    Objective:  BP (!) 173/76    Pulse 70    Ht '5\' 1"'  (1.549 m)    Wt 87 lb 3.2 oz (39.6 kg)    LMP 03/17/2012    SpO2 90%    BMI 16.48 kg/m  BP/Weight 12/02/2021 11/26/2021 30/16/0109  Systolic BP 323 557 322  Diastolic BP 76 72 65  Wt. (Lbs) 87.2 94 93  BMI 16.48 17.76 17.57      Physical Exam Constitutional:      Appearance: She is well-developed.     Comments: Underweight  Cardiovascular:     Rate and Rhythm: Normal rate.     Heart sounds: Normal heart sounds. No murmur heard. Pulmonary:     Effort: Pulmonary effort is normal.     Breath sounds: Normal breath sounds. No wheezing or rales.  Chest:     Chest wall: No tenderness.  Abdominal:     General: Bowel sounds are normal. There is no distension.     Palpations: Abdomen is soft. There is no mass.     Tenderness: There is no abdominal tenderness.  Musculoskeletal:        General: Normal range of motion.     Right lower leg: No edema.     Left lower leg: No edema.  Neurological:     Mental Status: She is alert and oriented to person, place, and time.  Psychiatric:        Mood and Affect: Mood normal.    CMP Latest Ref Rng & Units 08/09/2021 07/31/2021 02/23/2020  Glucose 70 - 99 mg/dL 80 73 85  BUN 8 - 27 mg/dL 10 9 5(L)  Creatinine 0.57 - 1.00 mg/dL 1.07(H) 0.99 0.70  Sodium 134 - 144 mmol/L 143 143 143   Potassium 3.5 - 5.2 mmol/L 3.5 3.1(L) 3.2(L)  Chloride 96 - 106 mmol/L 103 105 106  CO2 20 - 29 mmol/L 23 29 -  Calcium 8.7 - 10.3 mg/dL 9.9 10.1 -  Total Protein 6.0 - 8.3 g/dL - 7.7 -  Total Bilirubin 0.2 - 1.2 mg/dL - 0.5 -  Alkaline Phos 39 - 117 U/L - 79 -  AST 0 - 37 U/L - 21 -  ALT 0 - 35 U/L - 16 -    Lipid Panel     Component Value Date/Time   CHOL 204 (H) 01/01/2018 1223   TRIG 92 01/01/2018 1223   HDL 102 01/01/2018 1223   CHOLHDL 2.0 01/01/2018 1223   CHOLHDL 2.3 05/07/2016 0739   VLDL 20 05/07/2016 0739   LDLCALC 84 01/01/2018 1223    CBC    Component Value Date/Time   WBC 5.0 07/31/2021 1022   RBC 4.87 07/31/2021 1022   HGB 14.2 07/31/2021 1022   HGB 12.7 06/09/2017 1446   HCT 42.8 07/31/2021 1022   HCT 38.4 06/09/2017 1446   PLT 221.0 07/31/2021 1022   PLT 254 06/09/2017 1446   MCV 88.0 07/31/2021 1022   MCV 87 06/09/2017 1446   MCH 29.0 02/23/2020 1838   MCHC 33.2 07/31/2021 1022   RDW 18.7 (H) 07/31/2021 1022   RDW 15.8 (H) 06/09/2017 1446   LYMPHSABS 1.6 07/31/2021 1022   LYMPHSABS 2.4 06/09/2017 1446   MONOABS 0.4 07/31/2021 1022   EOSABS 0.1 07/31/2021 1022   EOSABS 0.2 06/09/2017 1446   BASOSABS 0.1 07/31/2021 1022   BASOSABS 0.0 06/09/2017 1446    No results found for: HGBA1C  Lab Results  Component Value Date   TSH 0.687 12/07/2020    Assessment & Plan:  1. Tobacco use Counseled on hazardous effects of smoking and the need to quit and she has cut back significantly Continue Bupropion Lung ca screen due again in 04/2022 (no malignancy on last from 04/2021) - buPROPion (WELLBUTRIN XL) 150  MG 24 hr tablet; TAKE 1 TABLET(150 MG) BY MOUTH DAILY FOR SMOKING CESSATION  Dispense: 90 tablet; Refill: 1  2. Essential hypertension Uncontrolled Previously controlled at last visit due to not taking all her antihypertensive Advised to take all medications Counseled on blood pressure goal of less than 130/80, low-sodium, DASH diet, medication  compliance, 150 minutes of moderate intensity exercise per week. Discussed medication compliance, adverse effects. - carvedilol (COREG) 3.125 MG tablet; TAKE 1 TABLET (3.125 MG TOTAL) BY MOUTH 2 (TWO) TIMES DAILY WITH A MEAL.  Dispense: 180 tablet; Refill: 1 - LP+Non-HDL Cholesterol - CMP14+EGFR  3. Chronic obstructive pulmonary disease, unspecified COPD type (HCC) Stable Smoking cessation will be beneficial - albuterol (VENTOLIN HFA) 108 (90 Base) MCG/ACT inhaler; INHALE 2 PUFFS INTO THE LUNGS EVERY 6 HOURS AS NEEDED  Dispense: 36 g; Refill: 2  4. Pruritus - cetirizine (ZYRTEC) 10 MG tablet; TAKE 1 TABLET BY MOUTH DAILY.  Dispense: 90 tablet; Refill: 1  5. Lumbar radiculopathy Stable - DULoxetine (CYMBALTA) 60 MG capsule; TAKE 1 CAPSULE (60 MG TOTAL) BY MOUTH DAILY.  Dispense: 90 capsule; Refill: 1  6. Other chronic sinusitis Stable - benzonatate (TESSALON) 100 MG capsule; TAKE 1 CAPSULE(100 MG) BY MOUTH TWICE DAILY  Dispense: 30 capsule; Refill: 2  7. Screening for diabetes mellitus - Hemoglobin A1c  8. Weight loss, unintentional Malignancy screen negative  Evaluated by GI and no GI etiology noted Unknown etiology of ongoing weight loss Currently on Rameron Will send off additional labs - Cancer Antigen 19-9 - CA 125 - CBC with Differential/Platelet - T4, free - TSH - Amb ref to Medical Nutrition Therapy-MNT  9. Moderate protein-calorie malnutrition ([place]) See #8 above    Meds ordered this encounter  Medications   pantoprazole (PROTONIX) 40 MG tablet    Sig: TAKE 1 TABLET (40 MG TOTAL) BY MOUTH DAILY.    Dispense:  90 tablet    Refill:  1   azelastine (ASTELIN) 0.1 % nasal spray    Sig: Place 2 sprays into both nostrils 2 (two) times daily.    Dispense:  30 mL    Refill:  0   buPROPion (WELLBUTRIN XL) 150 MG 24 hr tablet    Sig: TAKE 1 TABLET(150 MG) BY MOUTH DAILY FOR SMOKING CESSATION    Dispense:  90 tablet    Refill:  1   carvedilol (COREG) 3.125 MG  tablet    Sig: TAKE 1 TABLET (3.125 MG TOTAL) BY MOUTH 2 (TWO) TIMES DAILY WITH A MEAL.    Dispense:  180 tablet    Refill:  1   albuterol (VENTOLIN HFA) 108 (90 Base) MCG/ACT inhaler    Sig: INHALE 2 PUFFS INTO THE LUNGS EVERY 6 HOURS AS NEEDED    Dispense:  36 g    Refill:  2   cetirizine (ZYRTEC) 10 MG tablet    Sig: TAKE 1 TABLET BY MOUTH DAILY.    Dispense:  90 tablet    Refill:  1   DULoxetine (CYMBALTA) 60 MG capsule    Sig: TAKE 1 CAPSULE (60 MG TOTAL) BY MOUTH DAILY.    Dispense:  90 capsule    Refill:  1   benzonatate (TESSALON) 100 MG capsule    Sig: TAKE 1 CAPSULE(100 MG) BY MOUTH TWICE DAILY    Dispense:  30 capsule    Refill:  2    Follow-up: Return in about 3 months (around 03/01/2022) for Chronic medical conditions.       Charlott Rakes, MD,  FAAFP. [facility] and [place], [place]   12/02/2021, 12:25 PM

## 2021-12-03 ENCOUNTER — Other Ambulatory Visit: Payer: Self-pay | Admitting: Family Medicine

## 2021-12-03 ENCOUNTER — Telehealth: Payer: Self-pay

## 2021-12-03 ENCOUNTER — Other Ambulatory Visit: Payer: Self-pay

## 2021-12-03 DIAGNOSIS — E876 Hypokalemia: Secondary | ICD-10-CM

## 2021-12-03 LAB — CMP14+EGFR
ALT: 34 IU/L — ABNORMAL HIGH (ref 0–32)
AST: 37 IU/L (ref 0–40)
Albumin/Globulin Ratio: 1.7 (ref 1.2–2.2)
Albumin: 4.6 g/dL (ref 3.8–4.8)
Alkaline Phosphatase: 101 IU/L (ref 44–121)
BUN/Creatinine Ratio: 7 — ABNORMAL LOW (ref 12–28)
BUN: 7 mg/dL — ABNORMAL LOW (ref 8–27)
Bilirubin Total: 0.3 mg/dL (ref 0.0–1.2)
CO2: 21 mmol/L (ref 20–29)
Calcium: 9.3 mg/dL (ref 8.7–10.3)
Chloride: 106 mmol/L (ref 96–106)
Creatinine, Ser: 1.05 mg/dL — ABNORMAL HIGH (ref 0.57–1.00)
Globulin, Total: 2.7 g/dL (ref 1.5–4.5)
Glucose: 83 mg/dL (ref 70–99)
Potassium: 2.8 mmol/L — ABNORMAL LOW (ref 3.5–5.2)
Sodium: 148 mmol/L — ABNORMAL HIGH (ref 134–144)
Total Protein: 7.3 g/dL (ref 6.0–8.5)
eGFR: 59 mL/min/{1.73_m2} — ABNORMAL LOW (ref >59)

## 2021-12-03 LAB — CBC WITH DIFFERENTIAL/PLATELET
Basophils Absolute: 0 10*3/uL (ref 0.0–0.2)
Basos: 1 %
EOS (ABSOLUTE): 0.1 10*3/uL (ref 0.0–0.4)
Eos: 1 %
Hematocrit: 42.1 % (ref 34.0–46.6)
Hemoglobin: 13.8 g/dL (ref 11.1–15.9)
Immature Grans (Abs): 0 10*3/uL (ref 0.0–0.1)
Immature Granulocytes: 0 %
Lymphocytes Absolute: 2.1 10*3/uL (ref 0.7–3.1)
Lymphs: 38 %
MCH: 29.9 pg (ref 26.6–33.0)
MCHC: 32.8 g/dL (ref 31.5–35.7)
MCV: 91 fL (ref 79–97)
Monocytes Absolute: 0.4 10*3/uL (ref 0.1–0.9)
Monocytes: 8 %
Neutrophils Absolute: 2.9 10*3/uL (ref 1.4–7.0)
Neutrophils: 52 %
Platelets: 258 10*3/uL (ref 150–450)
RBC: 4.61 x10E6/uL (ref 3.77–5.28)
RDW: 15.8 % — ABNORMAL HIGH (ref 11.7–15.4)
WBC: 5.5 10*3/uL (ref 3.4–10.8)

## 2021-12-03 LAB — HEMOGLOBIN A1C
Est. average glucose Bld gHb Est-mCnc: 117 mg/dL
Hgb A1c MFr Bld: 5.7 % — ABNORMAL HIGH (ref 4.8–5.6)

## 2021-12-03 LAB — LP+NON-HDL CHOLESTEROL
Cholesterol, Total: 160 mg/dL (ref 100–199)
HDL: 122 mg/dL (ref >39)
LDL Chol Calc (NIH): 27 mg/dL (ref 0–99)
Total Non-HDL-Chol (LDL+VLDL): 38 mg/dL (ref 0–129)
Triglycerides: 49 mg/dL (ref 0–149)
VLDL Cholesterol Cal: 11 mg/dL (ref 5–40)

## 2021-12-03 LAB — CA 125: Cancer Antigen (CA) 125: 15.7 U/mL (ref 0.0–38.1)

## 2021-12-03 LAB — T4, FREE: Free T4: 1.02 ng/dL (ref 0.82–1.77)

## 2021-12-03 LAB — CANCER ANTIGEN 19-9: CA 19-9: <2 U/mL (ref 0–35)

## 2021-12-03 LAB — TSH: TSH: 0.778 u[IU]/mL (ref 0.450–4.500)

## 2021-12-03 MED ORDER — POTASSIUM CHLORIDE CRYS ER 20 MEQ PO TBCR
20.0000 meq | EXTENDED_RELEASE_TABLET | Freq: Two times a day (BID) | ORAL | 3 refills | Status: DC
  Filled 2021-12-03: qty 40, 20d supply, fill #0
  Filled 2022-04-20: qty 40, 20d supply, fill #1

## 2021-12-03 NOTE — Telephone Encounter (Signed)
Receipt confirmed by pharmacy 10/01/22  at 1212  Requested Prescriptions  Pending Prescriptions Disp Refills   azelastine (ASTELIN) 0.1 % nasal spray [Pharmacy Med Name: AZELASTINE 0.1%(137MCG) NASAL-200SP] 90 mL     Sig: USE 2 SPRAYS IN EACH NOSTRIL TWICE DAILY     Ear, Nose, and Throat: Nasal Preparations - Antiallergy Passed - 12/02/2021 12:32 PM      Passed - Valid encounter within last 12 months    Recent Outpatient Visits          Yesterday Screening for diabetes mellitus   [facility] Correll, Odette Horns, MD   7 months ago Weight loss, unintentional   [facility] Hoy Register, MD   12 months ago Thyromegaly   [facility] Flo Shanks, MD   12 months ago Other chronic sinusitis   [facility] Hoy Register, MD   1 year ago Right elbow pain   [facility] Hoy Register, MD      Future Appointments            In 2 weeks Tenny Craw, Sherol Dade, MD [facility] [address] Office, [facility]   In 3 months Hoy Register, MD [facility]

## 2021-12-03 NOTE — Telephone Encounter (Signed)
Patient name and DOB has been verified Patient was informed of lab results. Patient had no questions.   Lab appointment has been set. 

## 2021-12-03 NOTE — Telephone Encounter (Signed)
-----   Message from Hoy Register, MD sent at 12/03/2021 12:47 PM EST ----- Please inform her that labs revealed severely low potassium I have sent a prescription for potassium to the pharmacy and she will need to repeat labs in 1 week.  I have also referred her to nephrology.

## 2021-12-04 ENCOUNTER — Other Ambulatory Visit: Payer: Self-pay

## 2021-12-13 ENCOUNTER — Other Ambulatory Visit: Payer: Self-pay

## 2021-12-13 ENCOUNTER — Ambulatory Visit: Payer: Medicare Other | Attending: Family Medicine

## 2021-12-13 ENCOUNTER — Telehealth: Payer: Self-pay | Admitting: Family Medicine

## 2021-12-13 DIAGNOSIS — E876 Hypokalemia: Secondary | ICD-10-CM

## 2021-12-13 NOTE — Telephone Encounter (Signed)
Call placed to patient and VM was left informing patient to contact office to schedule a lab appointment. ?

## 2021-12-13 NOTE — Telephone Encounter (Signed)
Please advise her to schedule a lab appointment to have a magnesium level done as this was recommended by Dr Joylene Grapes with [facility] whom I referred her to.  Thank you ?

## 2021-12-14 LAB — BASIC METABOLIC PANEL
BUN/Creatinine Ratio: 10 — ABNORMAL LOW (ref 12–28)
BUN: 8 mg/dL (ref 8–27)
CO2: 21 mmol/L (ref 20–29)
Calcium: 9.4 mg/dL (ref 8.7–10.3)
Chloride: 109 mmol/L — ABNORMAL HIGH (ref 96–106)
Creatinine, Ser: 0.81 mg/dL (ref 0.57–1.00)
Glucose: 77 mg/dL (ref 70–99)
Potassium: 3.6 mmol/L (ref 3.5–5.2)
Sodium: 146 mmol/L — ABNORMAL HIGH (ref 134–144)
eGFR: 80 mL/min/{1.73_m2} (ref >59)

## 2021-12-16 ENCOUNTER — Other Ambulatory Visit: Payer: Self-pay

## 2021-12-16 DIAGNOSIS — E876 Hypokalemia: Secondary | ICD-10-CM

## 2021-12-17 LAB — SPECIMEN STATUS REPORT

## 2021-12-17 LAB — MAGNESIUM: Magnesium: 1.8 mg/dL (ref 1.6–2.3)

## 2021-12-18 ENCOUNTER — Ambulatory Visit: Payer: Medicare Other

## 2021-12-18 ENCOUNTER — Other Ambulatory Visit: Payer: Self-pay

## 2021-12-18 ENCOUNTER — Other Ambulatory Visit: Payer: Medicare Other

## 2021-12-22 NOTE — Progress Notes (Unsigned)
Cardiology Office Note   Date:  12/22/2021   ID:  Tammy Schwartz, DOB [birth year], MRN [id number]  PCP:  Hoy Register, MD  Cardiologist:   Dietrich Pates, MD   Patient referred for eval of CAD, calcifications seen on CT   History of Present Illness: Tammy Schwartz is a 66 y.o. female with a long hx of tobacco abuse (continues to smoke), COPD.  REcently sen has been seen by GI for evaluation of wt loss (120 to 92)  Notes occasional heartburn   CT done as part of evaluation and it showed signif calc of LM/LAD  The pt is a difficult historian   She does not some burning in her chest    Not associated with activity    Eases on own.   Has attrib to "heartburn"    Per GI waiting for GI evaluation until cleared from cardiac standpont  She still smokes, says she is trying to quit  I saw the pt in 2022  Pt denies CP or buring    Seen in GI   Rx for ulcers no idzziness She says she is losing wt   Proabbly isnt getting enough protein   No outpatient medications have been marked as taking for the 12/23/21 encounter (Appointment) with Pricilla Riffle, MD.     Allergies:   Patient has no known allergies.   Past Medical History:  Diagnosis Date   Chronic back pain    HNp,spondylosis,radiculopathy   COPD (chronic obstructive pulmonary disease) (HCC)    GERD (gastroesophageal reflux disease)    Hyperlipidemia    was on choloesterol meds last yr-samples given in office but not needed since   Hypertension    takes Exforge daily   Joint pain    Shortness of breath    takes Singulair daily;with exertion   Sickle cell trait (HCC)    Spondylolysis    Tobacco use     Past Surgical History:  Procedure Laterality Date   BACK SURGERY     BREAST EXCISIONAL BIOPSY Left 2007   benign   BREAST SURGERY Left    lumpectomy   COLONOSCOPY     DILATION AND CURETTAGE OF UTERUS     LUMBAR LAMINECTOMY/DECOMPRESSION MICRODISCECTOMY  06/04/2012   Procedure: LUMBAR  LAMINECTOMY/DECOMPRESSION MICRODISCECTOMY 1 LEVEL;  Surgeon: Carmela Hurt, MD;  Location: [facility];  Service: Neurosurgery;  Laterality: Left;  LEFT Lumbar five-sacral one diskectomy   LUMBAR LAMINECTOMY/DECOMPRESSION MICRODISCECTOMY Right 06/16/2013   Procedure: RIGHT Lumbar Five-Sacral One Microdiskectomy;  Surgeon: Carmela Hurt, MD;  Location: [facility];  Service: Neurosurgery;  Laterality: Right;  RIGHT Lumbar Five-Sacral One Microdiskectomy   LUMBAR LAMINECTOMY/DECOMPRESSION MICRODISCECTOMY Right 01/20/2014   Procedure: LUMBAR FOUR TO LUMBAR FIVE LUMBAR LAMINECTOMY/DECOMPRESSION MICRODISCECTOMY 1 LEVEL;  Surgeon: Carmela Hurt, MD;  Location: [facility];  Service: Neurosurgery;  Laterality: Right;  Right L45 laminectomy and foramintomy     Social History:  The patient  reports that she has been smoking cigarettes. She has a 5.00 pack-year smoking history. She has never used smokeless tobacco. She reports that she does not drink alcohol and does not use drugs.   Family History:  The patient's family history includes CAD in her father; Cancer - Other in her mother; Hypertension in her sister; Stomach cancer in her mother.    ROS:  Please see the history of present illness. All other systems are reviewed and  Negative to the above problem except as noted.  PHYSICAL EXAM: VS:  LMP 03/17/2012   GEN: Underweight 66 yo  in no acute distress  HEENT: normal  Neck: no JVD, carotid bruits Cardiac: RRR; no murmurs No LE  edema  Respiratory:  Bilateral rhonchi   Decrased airflow   GI: soft, nontender, nondistended, + BS  No hepatomegaly  MS: no deformity Moving all extremities   Skin: warm and dry, no rash Neuro:  Strength and sensation are intact Psych: euthymic mood, full affect   EKG:  EKG is ordered today.  SR 74 bpm      Lipid Panel    Component Value Date/Time   CHOL 160 12/02/2021 1220   TRIG 49 12/02/2021 1220   HDL 122 12/02/2021 1220   CHOLHDL 2.0 01/01/2018 1223    CHOLHDL 2.3 05/07/2016 0739   VLDL 20 05/07/2016 0739   LDLCALC 27 12/02/2021 1220      Wt Readings from Last 3 Encounters:  12/02/21 87 lb 3.2 oz (39.6 kg)  11/26/21 94 lb (42.6 kg)  10/01/21 93 lb (42.2 kg)      ASSESSMENT AND PLAN:  1  CAD  PT with signif calcifications of LAD and LM on CT SHe is a very difficult historian   Has had "heartburn"   Very difficult to assess activity level, I assume she is not too active  I would recomm a CT coronary angiogram to eval severity fo CAD      WIll need a statin  LDL 84  Start Crestor  2   COPD  Signifcant  Still smoking   COunselled on cessation  3  Lipdis   Tighter control  4  GI  Wt loss eval in progress   Hold until clear by cardiology    Wll need continued follow up in cardiology    Current medicines are reviewed at length with the patient today.  The patient does not have concerns regarding medicines.  Signed, Dietrich Pates, MD  12/22/2021 10:35 PM    [facility] [address] [place], [place], [place]  [address] Phone: [phone]; Fax: [phone]

## 2021-12-23 ENCOUNTER — Ambulatory Visit (INDEPENDENT_AMBULATORY_CARE_PROVIDER_SITE_OTHER): Payer: Medicare Other | Admitting: Internal Medicine

## 2021-12-23 ENCOUNTER — Ambulatory Visit: Payer: Medicare Other | Admitting: Internal Medicine

## 2021-12-23 ENCOUNTER — Encounter: Payer: Self-pay | Admitting: Internal Medicine

## 2021-12-23 ENCOUNTER — Other Ambulatory Visit: Payer: Self-pay

## 2021-12-23 VITALS — BP 96/70 | HR 64 | Ht 61.0 in | Wt 85.0 lb

## 2021-12-23 DIAGNOSIS — I2510 Atherosclerotic heart disease of native coronary artery without angina pectoris: Secondary | ICD-10-CM | POA: Diagnosis not present

## 2021-12-23 NOTE — Patient Instructions (Signed)
Medication Instructions:  ?Your physician recommends that you continue on your current medications as directed. Please refer to the Current Medication list given to you today. ? ?*If you need a refill on your cardiac medications before your next appointment, please call your pharmacy* ? ? ?Lab Work: ?none ?If you have labs (blood work) drawn today and your tests are completely normal, you will receive your results only by: ?MyChart Message (if you have MyChart) OR ?A paper copy in the mail ?If you have any lab test that is abnormal or we need to change your treatment, we will call you to review the results. ? ? ?Testing/Procedures: ?none ? ? ?Follow-Up: ?At [facility], you and your health needs are our priority.  As part of our continuing mission to provide you with exceptional heart care, we have created designated Provider Care Teams.  These Care Teams include your primary Cardiologist (physician) and Advanced Practice Providers (APPs -  Physician Assistants and Nurse Practitioners) who all work together to provide you with the care you need, when you need it. ? ?We recommend signing up for the patient portal called "MyChart".  Sign up information is provided on this After Visit Summary.  MyChart is used to connect with patients for Virtual Visits (Telemedicine).  Patients are able to view lab/test results, encounter notes, upcoming appointments, etc.  Non-urgent messages can be sent to your provider as well.   ?To learn more about what you can do with MyChart, go to [email or web address].   ? ?Your next appointment:   ?8 month(s) ? ?The format for your next appointment:   ?In Person ? ?Dr. Dietrich Pates  ? ?If primary card or EP is not listed click here to update    :1}  ? ? ?Other Instructions ?  ?

## 2022-01-09 ENCOUNTER — Other Ambulatory Visit: Payer: Self-pay

## 2022-01-09 ENCOUNTER — Encounter (HOSPITAL_COMMUNITY): Payer: Self-pay | Admitting: Emergency Medicine

## 2022-01-09 ENCOUNTER — Emergency Department (HOSPITAL_COMMUNITY)
Admission: EM | Admit: 2022-01-09 | Discharge: 2022-01-09 | Disposition: A | Discharge status: Home / Self Care | Payer: Medicare Other | Attending: Emergency Medicine | Admitting: Emergency Medicine

## 2022-01-09 ENCOUNTER — Emergency Department (HOSPITAL_COMMUNITY): Payer: Medicare Other

## 2022-01-09 DIAGNOSIS — Z7982 Long term (current) use of aspirin: Secondary | ICD-10-CM | POA: Insufficient documentation

## 2022-01-09 DIAGNOSIS — M479 Spondylosis, unspecified: Secondary | ICD-10-CM | POA: Diagnosis not present

## 2022-01-09 DIAGNOSIS — M549 Dorsalgia, unspecified: Secondary | ICD-10-CM | POA: Diagnosis present

## 2022-01-09 MED ORDER — OXYCODONE-ACETAMINOPHEN 5-325 MG PO TABS
1.0000 | ORAL_TABLET | Freq: Three times a day (TID) | ORAL | 0 refills | Status: DC | PRN
  Filled 2022-01-09: qty 8, 3d supply, fill #0

## 2022-01-09 MED ORDER — TIZANIDINE HCL 4 MG PO TABS: 4.0000 mg | ORAL_TABLET | Freq: Three times a day (TID) | ORAL | 0 refills | Status: DC | PRN

## 2022-01-09 MED ORDER — NAPROXEN 375 MG PO TABS
375.0000 mg | ORAL_TABLET | Freq: Once | ORAL | Status: AC
Start: 2022-01-09 — End: 2022-01-09
  Administered 2022-01-09: 375 mg via ORAL
  Filled 2022-01-09: qty 1

## 2022-01-09 MED ORDER — ONDANSETRON 4 MG PO TBDP
4.0000 mg | ORAL_TABLET | Freq: Once | ORAL | Status: AC
Start: 2022-01-09 — End: 2022-01-09
  Administered 2022-01-09: 4 mg via ORAL

## 2022-01-09 MED ORDER — MORPHINE SULFATE (PF) 4 MG/ML IV SOLN
4.0000 mg | Freq: Once | INTRAVENOUS | Status: AC
  Administered 2022-01-09: 4 mg via INTRAMUSCULAR
  Filled 2022-01-09: qty 1

## 2022-01-09 MED ORDER — NAPROXEN 375 MG PO TABS
375.0000 mg | ORAL_TABLET | Freq: Two times a day (BID) | ORAL | 0 refills | Status: DC
  Filled 2022-01-09: qty 10, 5d supply, fill #0

## 2022-01-09 MED ORDER — TIZANIDINE HCL 4 MG PO TABS
4.0000 mg | ORAL_TABLET | Freq: Three times a day (TID) | ORAL | 0 refills | Status: DC | PRN
  Filled 2022-01-09: qty 20, 7d supply, fill #0

## 2022-01-09 MED ORDER — KETOROLAC TROMETHAMINE 60 MG/2ML IM SOLN
30.0000 mg | Freq: Once | INTRAMUSCULAR | Status: AC
  Administered 2022-01-09: 30 mg via INTRAMUSCULAR
  Filled 2022-01-09: qty 2

## 2022-01-09 MED ORDER — NAPROXEN 375 MG PO TABS: 375.0000 mg | ORAL_TABLET | Freq: Two times a day (BID) | ORAL | 0 refills | Status: DC

## 2022-01-09 MED ORDER — ACETAMINOPHEN 325 MG PO TABS
650.0000 mg | ORAL_TABLET | Freq: Once | ORAL | Status: AC
  Administered 2022-01-09: 650 mg via ORAL
  Filled 2022-01-09: qty 2

## 2022-01-09 MED ORDER — OXYCODONE-ACETAMINOPHEN 5-325 MG PO TABS
1.0000 | ORAL_TABLET | Freq: Three times a day (TID) | ORAL | 0 refills | Status: DC | PRN
Start: 2022-01-09 — End: 2022-11-24

## 2022-01-09 MED ORDER — TIZANIDINE HCL 4 MG PO TABS
4.0000 mg | ORAL_TABLET | Freq: Once | ORAL | Status: AC
  Administered 2022-01-09: 4 mg via ORAL
  Filled 2022-01-09: qty 1

## 2022-01-09 MED ORDER — TRAMADOL HCL 50 MG PO TABS
100.0000 mg | ORAL_TABLET | Freq: Once | ORAL | Status: AC
  Administered 2022-01-09: 100 mg via ORAL
  Filled 2022-01-09: qty 2

## 2022-01-09 NOTE — ED Provider Notes (Signed)
Patient had been seen by prior provider due to having chronic back pain.  Does give medications on it the stomach and she had episode of emesis.  Will give dose of medications here and discharge ?  ?Lorre Nick, MD ?01/09/22 1652 ? ?

## 2022-01-09 NOTE — ED Notes (Signed)
Pt vomited all medications up when RN was attempting to discharge pt home.  ?

## 2022-01-09 NOTE — ED Triage Notes (Signed)
Per pt, states she has a history of chronic back pain and back surgeries-states increase pain starting last Thursday-states he legs "went out " from under her this am ?

## 2022-01-09 NOTE — Discharge Instructions (Addendum)
You are seen in the ER for back pain. ? ?X-ray showing worsening degenerative spine disease -but no other acute concerns noted. ? ?We recommend that you take the medications prescribed and follow-up with your PCP. ?Take the narcotic medicine only for severe pain. ?

## 2022-01-09 NOTE — ED Notes (Signed)
Patient transported to X-ray 

## 2022-01-10 ENCOUNTER — Other Ambulatory Visit: Payer: Self-pay

## 2022-01-15 NOTE — ED Provider Notes (Signed)
?[facility]-EMERGENCY DEPT ?Provider Note ? ? ?CSN: [id number] ?Arrival date & time: 01/09/22  1301 ? ?  ? ?History ? ?Chief Complaint  ?Patient presents with  ? Back Pain  ? ? ?Tammy Schwartz is a 66 y.o. female. ? ?HPI ? ?  ?Pt comes in to the ER with cc of back pain. Pt indicates chronic back pain. States that normally the pain is tolerable with otc meds, but more recently the pain is more severe. She has no specific trauma. The pain radiates down the buttock area, but there is no associated numbness. Her legs have given out on times when walking. Pt has no associated numbness, weakness, urinary incontinence, urinary retention, bowel incontinence, pins and needle sensation in the perineal area. No uti like symptoms, diarrhea, bloody stools, abd pain. ? ? ?Home Medications ?Prior to Admission medications   ?Medication Sig Start Date End Date Taking? Authorizing Provider  ?albuterol (VENTOLIN HFA) 108 (90 Base) MCG/ACT inhaler INHALE 2 PUFFS INTO THE LUNGS EVERY 6 HOURS AS NEEDED 12/02/21   Hoy Register, MD  ?amLODipine (NORVASC) 10 MG tablet TAKE 1 TABLET (10 MG TOTAL) BY MOUTH DAILY. 04/29/21 04/29/22  Hoy Register, MD  ?aspirin EC 81 MG tablet Take 1 tablet (81 mg total) by mouth daily. Swallow whole. 08/26/21   Pricilla Riffle, MD  ?azelastine (ASTELIN) 0.1 % nasal spray Place 2 sprays into both nostrils 2 (two) times daily. 12/02/21   Hoy Register, MD  ?benzonatate (TESSALON) 100 MG capsule TAKE 1 CAPSULE(100 MG) BY MOUTH TWICE DAILY 12/02/21   Hoy Register, MD  ?buPROPion (WELLBUTRIN XL) 150 MG 24 hr tablet TAKE 1 TABLET(150 MG) BY MOUTH DAILY FOR SMOKING CESSATION 12/02/21   Hoy Register, MD  ?carvedilol (COREG) 3.125 MG tablet TAKE 1 TABLET (3.125 MG TOTAL) BY MOUTH 2 (TWO) TIMES DAILY WITH A MEAL. 12/02/21 12/02/22  Hoy Register, MD  ?cetirizine (ZYRTEC) 10 MG tablet TAKE 1 TABLET BY MOUTH DAILY. 12/02/21 12/02/22  Hoy Register, MD  ?dextromethorphan-guaiFENesin  (MUCINEX DM) 30-600 MG 12hr tablet Take 1 tablet by mouth 2 (two) times daily. 10/16/20   Wieters, Hallie C, PA-C  ?DULoxetine (CYMBALTA) 60 MG capsule TAKE 1 CAPSULE (60 MG TOTAL) BY MOUTH DAILY. 12/02/21 12/02/22  Hoy Register, MD  ?lactulose (CHRONULAC) 10 GM/15ML solution Take 15 mLs (10 g total) by mouth 2 (two) times daily as needed for mild constipation. 06/23/17   Hoy Register, MD  ?meclizine (ANTIVERT) 25 MG tablet Take 1 tablet (25 mg total) by mouth 3 (three) times daily as needed for dizziness. 11/29/19   Hoy Register, MD  ?mirtazapine (REMERON) 30 MG tablet TAKE 1 TABLET(30 MG) BY MOUTH AT BEDTIME 10/10/21   Hoy Register, MD  ?montelukast (SINGULAIR) 10 MG tablet Take 10 mg by mouth daily.    [provider]  ?Multiple Vitamin (MULTIVITAMIN WITH MINERALS) TABS Take 1 tablet by mouth daily.    [provider]  ?naproxen (NAPROSYN) 375 MG tablet Take 1 tablet (375 mg total) by mouth 2 (two) times daily. 01/09/22   Lorre Nick, MD  ?oxyCODONE-acetaminophen (PERCOCET/ROXICET) 5-325 MG tablet Take 1 tablet by mouth every 8 (eight) hours as needed for severe pain. 01/09/22   Lorre Nick, MD  ?pantoprazole (PROTONIX) 40 MG tablet TAKE 1 TABLET (40 MG TOTAL) BY MOUTH DAILY. 12/02/21 12/02/22  Hoy Register, MD  ?polyethylene glycol powder (GLYCOLAX/MIRALAX) powder Take 17 g by mouth daily. 06/09/17   Hoy Register, MD  ?potassium chloride SA (KLOR-CON M) 20 MEQ  tablet Take 1 tablet (20 mEq total) by mouth 2 (two) times daily. For 5 days then changed to 1 tablet daily 12/03/21   Hoy Register, MD  ?rosuvastatin (CRESTOR) 10 MG tablet Take 1 tablet (10 mg total) by mouth daily. 08/09/21 12/23/21  Pricilla Riffle, MD  ?tiZANidine (ZANAFLEX) 4 MG tablet Take 1 tablet (4 mg total) by mouth every 8 (eight) hours as needed for muscle spasms. 01/09/22   Lorre Nick, MD  ?triamcinolone cream (KENALOG) 0.1 % APPLY 1 APPLICATION TOPICALLY 2 (TWO) TIMES DAILY. ?Patient not taking: Reported on  12/23/2021 04/04/20   Hoy Register, MD  ?   ? ?Allergies    ?Patient has no known allergies.   ? ?Review of Systems   ?Review of Systems  ?All other systems reviewed and are negative. ? ?Physical Exam ?Updated Vital Signs ?BP 116/64   Pulse 76   Temp 98.1 ?F (36.7 ?C) (Oral)   Resp 16   Ht 5\' 1"  (1.549 m)   Wt 39 kg   LMP 03/17/2012   SpO2 95%   BMI 16.25 kg/m?  ?Physical Exam ?Vitals and nursing note reviewed.  ?Constitutional:   ?   Appearance: She is well-developed.  ?HENT:  ?   Head: Atraumatic.  ?Cardiovascular:  ?   Rate and Rhythm: Normal rate.  ?Pulmonary:  ?   Effort: Pulmonary effort is normal.  ?Musculoskeletal:     ?   General: Tenderness present.  ?   Cervical back: Normal range of motion and neck supple.  ?   Comments: Reproducible spine tenderness, 1+ patellar reflex, gross sensory exam is normal  ?Skin: ?   General: Skin is warm and dry.  ?Neurological:  ?   Mental Status: She is alert and oriented to person, place, and time.  ? ? ?ED Results / Procedures / Treatments   ?Labs ?(all labs ordered are listed, but only abnormal results are displayed) ?Labs Reviewed - No data to display ? ?EKG ?None ? ?Radiology ?No results found. ? ?Procedures ?Procedures  ? ? ?Medications Ordered in ED ?Medications  ?traMADol (ULTRAM) tablet 100 mg (100 mg Oral Given 01/09/22 1529)  ?acetaminophen (TYLENOL) tablet 650 mg (650 mg Oral Given 01/09/22 1529)  ?naproxen (NAPROSYN) tablet 375 mg (375 mg Oral Given 01/09/22 1529)  ?tiZANidine (ZANAFLEX) tablet 4 mg (4 mg Oral Given 01/09/22 1529)  ?ondansetron (ZOFRAN-ODT) disintegrating tablet 4 mg (4 mg Oral Given 01/09/22 1634)  ?ketorolac (TORADOL) injection 30 mg (30 mg Intramuscular Given 01/09/22 1701)  ?morphine (PF) 4 MG/ML injection 4 mg (4 mg Intramuscular Given 01/09/22 1702)  ? ? ?ED Course/ Medical Decision Making/ A&P ?  ?                        ?Medical Decision Making ?Pt comes in with cc of back pain. ?Has hx of chronic spine disease and chronic pain.  Quality of pain is same, it is more intense. No red flags suggesting cord compression. Exam is reassuring. ? ?DDX: ?Spondylosis, dgd of vertebral spine, herniated disk ? ?Based on hx and exam, cord compression unlikely and I ordered screening Xrays, which reveals chronic changes. ? ?Pt has been seen by nsurgery in the past. ?Will focus on pain control and have her f/u with nsurgery if the pain remains high. ? ?Amount and/or Complexity of Data Reviewed ?External Data Reviewed: radiology. ?Radiology: ordered and independent interpretation performed. Decision-making details documented in ED Course. ? ?Risk ?OTC drugs. ?Prescription drug management. ? ? ? ?  Final Clinical Impression(s) / ED Diagnoses ?Final diagnoses:  ?Spondylosis  ? ? ?Rx / DC Orders ?ED Discharge Orders   ? ?      Ordered  ?  oxyCODONE-acetaminophen (PERCOCET/ROXICET) 5-325 MG tablet  Every 8 hours PRN,   Status:  Discontinued       ? 01/09/22 1544  ?  tiZANidine (ZANAFLEX) 4 MG tablet  Every 8 hours PRN,   Status:  Discontinued       ? 01/09/22 1544  ?  naproxen (NAPROSYN) 375 MG tablet  2 times daily,   Status:  Discontinued       ? 01/09/22 1544  ?  naproxen (NAPROSYN) 375 MG tablet  2 times daily       ? 01/09/22 1713  ?  oxyCODONE-acetaminophen (PERCOCET/ROXICET) 5-325 MG tablet  Every 8 hours PRN       ? 01/09/22 1713  ?  tiZANidine (ZANAFLEX) 4 MG tablet  Every 8 hours PRN       ? 01/09/22 1713  ? ?  ?  ? ?  ? ? ?  ?Derwood KaplanNanavati, Anaisabel Pederson, MD ?01/15/22 2227 ? ?

## 2022-02-28 ENCOUNTER — Other Ambulatory Visit: Payer: Self-pay | Admitting: Family Medicine

## 2022-02-28 DIAGNOSIS — I10 Essential (primary) hypertension: Secondary | ICD-10-CM

## 2022-02-28 NOTE — Telephone Encounter (Signed)
Requested Prescriptions  Pending Prescriptions Disp Refills  . amLODipine (NORVASC) 10 MG tablet [Pharmacy Med Name: AMLODIPINE BESYLATE 10MG  TABLETS] 90 tablet 1    Sig: TAKE 1 TABLET(10 MG) BY MOUTH DAILY     Cardiovascular: Calcium Channel Blockers 2 Passed - 02/28/2022  3:45 AM      Passed - Last BP in normal range    BP Readings from Last 1 Encounters:  01/09/22 116/64         Passed - Last Heart Rate in normal range    Pulse Readings from Last 1 Encounters:  01/09/22 76         Passed - Valid encounter within last 6 months    Recent Outpatient Visits          2 months ago Screening for diabetes mellitus   [facility] [place], [place], [place]   10 months ago Weight loss, unintentional   [facility] [place], [place], MD   1 year ago Thyromegaly   [facility], MD   1 year ago Other chronic sinusitis   [facility] [place], MD   1 year ago Right elbow pain    [facility] Hoy Register, MD      Future Appointments            In 1 week Hoy Register, MD [facility]

## 2022-03-12 ENCOUNTER — Ambulatory Visit: Payer: Medicare Other | Admitting: Family Medicine

## 2022-03-14 ENCOUNTER — Ambulatory Visit: Payer: Medicare Other | Attending: Family Medicine | Admitting: Family Medicine

## 2022-03-14 ENCOUNTER — Other Ambulatory Visit: Payer: Self-pay

## 2022-03-14 ENCOUNTER — Encounter: Payer: Self-pay | Admitting: Family Medicine

## 2022-03-14 VITALS — BP 148/83 | HR 64 | Ht 61.0 in | Wt 87.0 lb

## 2022-03-14 DIAGNOSIS — I700 Atherosclerosis of aorta: Secondary | ICD-10-CM | POA: Diagnosis not present

## 2022-03-14 DIAGNOSIS — Z23 Encounter for immunization: Secondary | ICD-10-CM

## 2022-03-14 DIAGNOSIS — E440 Moderate protein-calorie malnutrition: Secondary | ICD-10-CM

## 2022-03-14 DIAGNOSIS — I10 Essential (primary) hypertension: Secondary | ICD-10-CM

## 2022-03-14 DIAGNOSIS — Z720 Tobacco use: Secondary | ICD-10-CM | POA: Diagnosis not present

## 2022-03-14 MED ORDER — BUPROPION HCL ER (XL) 150 MG PO TB24
ORAL_TABLET | ORAL | 1 refills | Status: DC
  Filled 2022-03-14 – 2022-04-20 (×3): qty 90, 90d supply, fill #0

## 2022-03-14 NOTE — Patient Instructions (Signed)

## 2022-03-14 NOTE — Progress Notes (Unsigned)
Subjective:  Patient ID: Tammy Schwartz, female    DOB: [birth year]  Age: 66 y.o. MRN: [id number]  CC: Hypertension   HPI Maribi Malley is a 66 y.o. year old female with a history of hypertension, tobacco abuse, COPD, lumbar spondylosis status post back surgery who presents today for follow-up visit.  Interval History: She is cutting back on her Cigarettes as she has a pack for over a month; currently on Bupropion for smoking cessation. Doing well on her antihypertensive and statin.  Her being underweight continues to be an issue and work up so far has been negative. She states her Mom was 99 lbs as well. She has no additional concerns today and is interested in receiving the Pneumococcal vaccine.  Past Medical History:  Diagnosis Date   Chronic back pain    HNp,spondylosis,radiculopathy   COPD (chronic obstructive pulmonary disease) (HCC)    GERD (gastroesophageal reflux disease)    Hyperlipidemia    was on choloesterol meds last yr-samples given in office but not needed since   Hypertension    takes Exforge daily   Joint pain    Shortness of breath    takes Singulair daily;with exertion   Sickle cell trait ([place])    Spondylolysis    Tobacco use     Past Surgical History:  Procedure Laterality Date   BACK SURGERY     BREAST EXCISIONAL BIOPSY Left 2007   benign   BREAST SURGERY Left    lumpectomy   COLONOSCOPY     DILATION AND CURETTAGE OF UTERUS     LUMBAR LAMINECTOMY/DECOMPRESSION MICRODISCECTOMY  06/04/2012   Procedure: LUMBAR LAMINECTOMY/DECOMPRESSION MICRODISCECTOMY 1 LEVEL;  Surgeon: Winfield Cunas, MD;  Location: [facility];  Service: Neurosurgery;  Laterality: Left;  LEFT Lumbar five-sacral one diskectomy   LUMBAR LAMINECTOMY/DECOMPRESSION MICRODISCECTOMY Right 06/16/2013   Procedure: RIGHT Lumbar Five-Sacral One Microdiskectomy;  Surgeon: Winfield Cunas, MD;  Location: [place] [facility];  Service: Neurosurgery;  Laterality: Right;  RIGHT  Lumbar Five-Sacral One Microdiskectomy   LUMBAR LAMINECTOMY/DECOMPRESSION MICRODISCECTOMY Right 01/20/2014   Procedure: LUMBAR FOUR TO LUMBAR FIVE LUMBAR LAMINECTOMY/DECOMPRESSION MICRODISCECTOMY 1 LEVEL;  Surgeon: Winfield Cunas, MD;  Location: [facility];  Service: Neurosurgery;  Laterality: Right;  Right L45 laminectomy and foramintomy    Family History  Problem Relation Age of Onset   Stomach cancer Mother    Cancer - Other Mother    CAD Father        Died age 23   Hypertension Sister     Social History   Socioeconomic History   Marital status: Married    Spouse name: Not on file   Number of children: Not on file   Years of education: Not on file   Highest education level: Not on file  Occupational History   Not on file  Tobacco Use   Smoking status: Every Day    Packs/day: 0.25    Years: 20.00    Pack years: 5.00    Types: Cigarettes   Smokeless tobacco: Never  Vaping Use   Vaping Use: Never used  Substance and Sexual Activity   Alcohol use: No   Drug use: No   Sexual activity: Yes    Birth control/protection: Post-menopausal  Other Topics Concern   Not on file  Social History Narrative   Lives with husband and son.    Social Determinants of Health   Financial Resource Strain: High Risk   Difficulty of Paying Living Expenses: Hard  Food Insecurity: No Food Insecurity   Worried About Charity fundraiser in the Last Year: Never true   Ran Out of Food in the Last Year: Never true  Transportation Needs: No Transportation Needs   Lack of Transportation (Medical): No   Lack of Transportation (Non-Medical): No  Physical Activity: Inactive   Days of Exercise per Week: 0 days   Minutes of Exercise per Session: 0 min  Stress: Stress Concern Present   Feeling of Stress : To some extent  Social Connections: Engineer, building services of Communication with Friends and Family: More than three times a week   Frequency of Social Gatherings with Friends and  Family: More than three times a week   Attends Religious Services: More than 4 times per year   Active Member of [employer] or Organizations: Yes   Attends Music therapist: More than 4 times per year   Marital Status: Married    No Known Allergies  Outpatient Medications Prior to Visit  Medication Sig Dispense Refill   albuterol (VENTOLIN HFA) 108 (90 Base) MCG/ACT inhaler INHALE 2 PUFFS INTO THE LUNGS EVERY 6 HOURS AS NEEDED 36 g 2   amLODipine (NORVASC) 10 MG tablet TAKE 1 TABLET(10 MG) BY MOUTH DAILY 90 tablet 0   aspirin EC 81 MG tablet Take 1 tablet (81 mg total) by mouth daily. Swallow whole. 100 tablet 3   azelastine (ASTELIN) 0.1 % nasal spray Place 2 sprays into both nostrils 2 (two) times daily. 30 mL 0   carvedilol (COREG) 3.125 MG tablet TAKE 1 TABLET (3.125 MG TOTAL) BY MOUTH 2 (TWO) TIMES DAILY WITH A MEAL. 180 tablet 1   cetirizine (ZYRTEC) 10 MG tablet TAKE 1 TABLET BY MOUTH DAILY. 90 tablet 1   dextromethorphan-guaiFENesin (MUCINEX DM) 30-600 MG 12hr tablet Take 1 tablet by mouth 2 (two) times daily. 20 tablet 0   DULoxetine (CYMBALTA) 60 MG capsule TAKE 1 CAPSULE (60 MG TOTAL) BY MOUTH DAILY. 90 capsule 1   lactulose (CHRONULAC) 10 GM/15ML solution Take 15 mLs (10 g total) by mouth 2 (two) times daily as needed for mild constipation. 946 mL 0   meclizine (ANTIVERT) 25 MG tablet Take 1 tablet (25 mg total) by mouth 3 (three) times daily as needed for dizziness. 60 tablet 1   mirtazapine (REMERON) 30 MG tablet TAKE 1 TABLET(30 MG) BY MOUTH AT BEDTIME 90 tablet 1   montelukast (SINGULAIR) 10 MG tablet Take 10 mg by mouth daily.     Multiple Vitamin (MULTIVITAMIN WITH MINERALS) TABS Take 1 tablet by mouth daily.     naproxen (NAPROSYN) 375 MG tablet Take 1 tablet (375 mg total) by mouth 2 (two) times daily. 10 tablet 0   pantoprazole (PROTONIX) 40 MG tablet TAKE 1 TABLET (40 MG TOTAL) BY MOUTH DAILY. 90 tablet 1   polyethylene glycol powder (GLYCOLAX/MIRALAX) powder  Take 17 g by mouth daily. 3350 g 1   potassium chloride SA (KLOR-CON M) 20 MEQ tablet Take 1 tablet (20 mEq total) by mouth 2 (two) times daily. For 5 days then changed to 1 tablet daily 40 tablet 3   tiZANidine (ZANAFLEX) 4 MG tablet Take 1 tablet (4 mg total) by mouth every 8 (eight) hours as needed for muscle spasms. 20 tablet 0   triamcinolone cream (KENALOG) 0.1 % APPLY 1 APPLICATION TOPICALLY 2 (TWO) TIMES DAILY. 80 g 0   benzonatate (TESSALON) 100 MG capsule TAKE 1 CAPSULE(100 MG) BY MOUTH TWICE DAILY 30 capsule 2  buPROPion (WELLBUTRIN XL) 150 MG 24 hr tablet TAKE 1 TABLET(150 MG) BY MOUTH DAILY FOR SMOKING CESSATION 90 tablet 1   oxyCODONE-acetaminophen (PERCOCET/ROXICET) 5-325 MG tablet Take 1 tablet by mouth every 8 (eight) hours as needed for severe pain. (Patient not taking: Reported on 03/14/2022) 8 tablet 0   rosuvastatin (CRESTOR) 10 MG tablet Take 1 tablet (10 mg total) by mouth daily. 90 tablet 3   No facility-administered medications prior to visit.     ROS Review of Systems  Constitutional:  Negative for activity change and appetite change.  HENT:  Negative for sinus pressure and sore throat.   Respiratory:  Negative for chest tightness, shortness of breath and wheezing.   Cardiovascular:  Negative for chest pain and palpitations.  Gastrointestinal:  Negative for abdominal distention, abdominal pain and constipation.  Genitourinary: Negative.   Musculoskeletal: Negative.   Psychiatric/Behavioral:  Negative for behavioral problems and dysphoric mood.    Objective:  BP (!) 148/83   Pulse 64   Ht 5\' 1"  (1.549 m)   Wt 87 lb (39.5 kg)   LMP 03/17/2012   SpO2 99%   BMI 16.44 kg/m      03/14/2022    3:53 PM 03/14/2022    2:51 PM 01/09/2022    5:30 PM  BP/Weight  Systolic BP [id number]  Diastolic BP 83 73 64  Wt. (Lbs)  87   BMI  16.44 kg/m2       Physical Exam Constitutional:      Appearance: She is well-developed.  Cardiovascular:     Rate and Rhythm:  Normal rate.     Heart sounds: Normal heart sounds. No murmur heard. Pulmonary:     Effort: Pulmonary effort is normal.     Breath sounds: Normal breath sounds. No wheezing or rales.  Chest:     Chest wall: No tenderness.  Abdominal:     General: Bowel sounds are normal. There is no distension.     Palpations: Abdomen is soft. There is no mass.     Tenderness: There is no abdominal tenderness.  Musculoskeletal:        General: Normal range of motion.     Right lower leg: No edema.     Left lower leg: No edema.  Neurological:     Mental Status: She is alert and oriented to person, place, and time.  Psychiatric:        Mood and Affect: Mood normal.       Latest Ref Rng & Units 12/13/2021    2:14 PM 12/02/2021   12:20 PM 08/09/2021   12:06 PM  CMP  Glucose 70 - 99 mg/dL 77   83   80    BUN 8 - 27 mg/dL 8   7   10     Creatinine 0.57 - 1.00 mg/dL 0.81   1.05   1.07    Sodium 134 - 144 mmol/L 146   148   143    Potassium 3.5 - 5.2 mmol/L 3.6   2.8   3.5    Chloride 96 - 106 mmol/L 109   106   103    CO2 20 - 29 mmol/L 21   21   23     Calcium 8.7 - 10.3 mg/dL 9.4   9.3   9.9    Total Protein 6.0 - 8.5 g/dL  7.3     Total Bilirubin 0.0 - 1.2 mg/dL  0.3     Alkaline Phos 44 - 121  IU/L  101     AST 0 - 40 IU/L  37     ALT 0 - 32 IU/L  34       Lipid Panel     Component Value Date/Time   CHOL 160 12/02/2021 1220   TRIG 49 12/02/2021 1220   HDL 122 12/02/2021 1220   CHOLHDL 2.0 01/01/2018 1223   CHOLHDL 2.3 05/07/2016 0739   VLDL 20 05/07/2016 0739   LDLCALC 27 12/02/2021 1220    CBC    Component Value Date/Time   WBC 5.5 12/02/2021 1220   WBC 5.0 07/31/2021 1022   RBC 4.61 12/02/2021 1220   RBC 4.87 07/31/2021 1022   HGB 13.8 12/02/2021 1220   HCT 42.1 12/02/2021 1220   PLT 258 12/02/2021 1220   MCV 91 12/02/2021 1220   MCH 29.9 12/02/2021 1220   MCH 29.0 02/23/2020 1838   MCHC 32.8 12/02/2021 1220   MCHC 33.2 07/31/2021 1022   RDW 15.8 (H) 12/02/2021 1220    LYMPHSABS 2.1 12/02/2021 1220   MONOABS 0.4 07/31/2021 1022   EOSABS 0.1 12/02/2021 1220   BASOSABS 0.0 12/02/2021 1220    Lab Results  Component Value Date   HGBA1C 5.7 (H) 12/02/2021    Lab Results  Component Value Date   TSH 0.778 12/02/2021    Assessment & Plan:  1. Atherosclerosis of aorta (HCC) Normal lipid panel Risk factor modification Continue Lipitor, low cholesterol diet  2. Tobacco use Spent 3 minutes counseling on smoking cessation a nd she is working on quitting - buPROPion (WELLBUTRIN XL) 150 MG 24 hr tablet; TAKE 1 TABLET(150 MG) BY MOUTH DAILY FOR SMOKING CESSATION  Dispense: 90 tablet; Refill: 1  3. Moderate protein-calorie malnutrition ([place]) Work up so far has been negative  4. Essential hypertension Slightly above goal No regimen change today Will reassess at next visit. Counseled on blood pressure goal of less than 130/80, low-sodium, DASH diet, medication compliance, 150 minutes of moderate intensity exercise per week. Discussed medication compliance, adverse effects.   5. Need for pneumococcal vaccine - Pneumococcal conjugate vaccine 20-valent    Meds ordered this encounter  Medications   buPROPion (WELLBUTRIN XL) 150 MG 24 hr tablet    Sig: TAKE 1 TABLET(150 MG) BY MOUTH DAILY FOR SMOKING CESSATION    Dispense:  90 tablet    Refill:  1    Follow-up: Return in about 3 months (around 06/14/2022) for Chronic medical conditions.       Charlott Rakes, MD, FAAFP. [facility] [place], [place]   03/15/2022, 7:41 AM

## 2022-04-20 ENCOUNTER — Other Ambulatory Visit: Payer: Self-pay | Admitting: Internal Medicine

## 2022-04-20 ENCOUNTER — Other Ambulatory Visit: Payer: Self-pay | Admitting: Family Medicine

## 2022-04-21 ENCOUNTER — Other Ambulatory Visit: Payer: Self-pay

## 2022-04-21 ENCOUNTER — Other Ambulatory Visit: Payer: Self-pay | Admitting: Family Medicine

## 2022-04-21 DIAGNOSIS — Z720 Tobacco use: Secondary | ICD-10-CM

## 2022-04-21 MED ORDER — AZELASTINE HCL 0.1 % NA SOLN: 2.0000 | Freq: Two times a day (BID) | NASAL | 0 refills | Status: DC

## 2022-04-21 MED ORDER — ADULT MULTIVITAMIN W/MINERALS CH: 1.0000 | ORAL_TABLET | Freq: Every day | ORAL | 3 refills | Status: DC

## 2022-04-21 MED ORDER — LACTULOSE 10 GM/15ML PO SOLN: 10.0000 g | Freq: Two times a day (BID) | ORAL | 0 refills | Status: DC | PRN

## 2022-04-22 ENCOUNTER — Other Ambulatory Visit: Payer: Self-pay | Admitting: Family Medicine

## 2022-04-22 MED ORDER — ROSUVASTATIN CALCIUM 10 MG PO TABS: 10.0000 mg | ORAL_TABLET | Freq: Every day | ORAL | 2 refills | Status: DC

## 2022-04-22 NOTE — Telephone Encounter (Signed)
Astelin is duplicate, Welbutrin new rx at diff pharm, Chronulac transmission was not received yesterday. Requested Prescriptions  Pending Prescriptions Disp Refills  . lactulose (CHRONULAC) 10 GM/15ML solution [Pharmacy Med Name: LACTULOSE 10GM/15ML SOLUTION] 946 mL 0    Sig: TAKE BY MOUTH TWICE DAILY AS NEEDED MILD CONSTIPATION     Gastroenterology:  Laxatives - lactulose Failed - 04/21/2022 11:54 AM      Failed - Cl in normal range and within 360 days    Chloride  Date Value Ref Range Status  12/13/2021 109 (H) 96 - 106 mmol/L Final         Failed - Na in normal range and within 360 days    Sodium  Date Value Ref Range Status  12/13/2021 146 (H) 134 - 144 mmol/L Final         Passed - CO2 in normal range and within 360 days    CO2  Date Value Ref Range Status  12/13/2021 21 20 - 29 mmol/L Final         Passed - K in normal range and within 360 days    Potassium  Date Value Ref Range Status  12/13/2021 3.6 3.5 - 5.2 mmol/L Final         Passed - Valid encounter within last 12 months    Recent Outpatient Visits          1 month ago Moderate protein-calorie malnutrition (HCC)   [facility] [place], Odette Horns, MD   4 months ago Screening for diabetes mellitus   [facility] [place], Odette Horns, MD   11 months ago Weight loss, unintentional   [place] [facility] Hoy Register, MD   1 year ago Thyromegaly   [place] [facility] Flo Shanks, MD   1 year ago Other chronic sinusitis   [place] [facility] Hoy Register, MD      Future Appointments            In 2 months Hoy Register, MD [facility]           Signed Prescriptions Disp Refills   Multiple Vitamin (MULTIVITAMIN WITH MINERALS) TABS tablet 30 tablet 3    Sig: Take 1 tablet by mouth daily.     There is no refill protocol information for this order      Multiple Vitamin (MULTIVITAMIN WITH MINERALS) TABS tablet 30 tablet 3    Sig: Take 1 tablet by mouth daily.     There is no refill protocol information for this order     Multiple Vitamin (MULTIVITAMIN WITH MINERALS) TABS tablet 30 tablet 3    Sig: Take 1 tablet by mouth daily.     There is no refill protocol information for this order    Refused Prescriptions Disp Refills  . azelastine (ASTELIN) 0.1 % nasal spray [Pharmacy Med Name: AZELASTINE 0.1%(137MCG) NASAL-200SP] 90 mL     Sig: USE 2 SPRAYS IN EACH NOSTRIL TWICE DAILY     Ear, Nose, and Throat: Nasal Preparations - Antiallergy Passed - 04/21/2022 11:54 AM      Passed - Valid encounter within last 12 months    Recent Outpatient Visits          1 month ago Moderate protein-calorie malnutrition (HCC)   [facility] [place], Odette Horns, MD   4 months ago Screening for diabetes mellitus   [facility] Hoy Register, MD   11 months ago Weight loss, unintentional   [facility] [place], Odette Horns, MD   1 year ago Thyromegaly   [facility] Flo Shanks, MD   1 year ago Other chronic sinusitis   [place] [facility] Hoy Register, MD      Future Appointments            In 2 months Hoy Register, MD [facility]           . buPROPion (WELLBUTRIN XL) 150 MG 24 hr tablet [Pharmacy Med Name: BUPROPION XL 150MG  TABLETS (24 H)] 90 tablet 1    Sig: TAKE 1 TABLET(150 MG) BY MOUTH DAILY FOR SMOKING CESSATION     Psychiatry: Antidepressants - bupropion Failed - 04/21/2022 11:54 AM      Failed - ALT in normal range and within 360 days    ALT  Date Value Ref Range Status  12/02/2021 34 (H) 0 - 32 IU/L Final         Failed - Last BP in normal range    BP Readings from Last 1 Encounters:  03/14/22 (!) 148/83         Passed - Cr in normal range and within 360  days    Creatinine, Ser  Date Value Ref Range Status  12/13/2021 0.81 0.57 - 1.00 mg/dL Final         Passed - AST in normal range and within 360 days    AST  Date Value Ref Range Status  12/02/2021 37 0 - 40 IU/L Final         Passed - Valid encounter within last 6 months    Recent Outpatient Visits          1 month ago Moderate protein-calorie malnutrition (HCC)   [place] [facility] [place], [place], MD   4 months ago Screening for diabetes mellitus   [place] [facility] [place], [place], MD   11 months ago Weight loss, unintentional   [facility] [place], [place], MD   1 year ago Thyromegaly   [place] [facility] Odette Horns, MD   1 year ago Other chronic sinusitis    [facility] Flo Shanks, MD      Future Appointments            In 2 months Hoy Register, MD [facility]

## 2022-05-07 ENCOUNTER — Other Ambulatory Visit: Payer: Self-pay | Admitting: Family Medicine

## 2022-05-07 DIAGNOSIS — J328 Other chronic sinusitis: Secondary | ICD-10-CM

## 2022-06-12 ENCOUNTER — Other Ambulatory Visit: Payer: Self-pay | Admitting: Family Medicine

## 2022-06-12 DIAGNOSIS — G4709 Other insomnia: Secondary | ICD-10-CM

## 2022-06-30 ENCOUNTER — Ambulatory Visit: Payer: Medicare Other | Admitting: Family Medicine

## 2022-07-28 ENCOUNTER — Other Ambulatory Visit: Payer: Self-pay

## 2022-07-28 MED ORDER — ROSUVASTATIN CALCIUM 10 MG PO TABS: 10.0000 mg | ORAL_TABLET | Freq: Every day | ORAL | 1 refills | Status: DC

## 2022-08-14 ENCOUNTER — Other Ambulatory Visit: Payer: Self-pay | Admitting: Family Medicine

## 2022-09-08 ENCOUNTER — Other Ambulatory Visit: Payer: Self-pay | Admitting: Family Medicine

## 2022-09-08 DIAGNOSIS — Z720 Tobacco use: Secondary | ICD-10-CM

## 2022-09-29 ENCOUNTER — Other Ambulatory Visit: Payer: Self-pay

## 2022-09-29 ENCOUNTER — Ambulatory Visit: Payer: Medicare Other | Attending: Family Medicine | Admitting: Family Medicine

## 2022-09-29 ENCOUNTER — Encounter: Payer: Self-pay | Admitting: Family Medicine

## 2022-09-29 VITALS — BP 179/79 | HR 64 | Temp 98.3°F | Ht 61.0 in | Wt 82.2 lb

## 2022-09-29 DIAGNOSIS — I10 Essential (primary) hypertension: Secondary | ICD-10-CM

## 2022-09-29 DIAGNOSIS — Z23 Encounter for immunization: Secondary | ICD-10-CM | POA: Diagnosis not present

## 2022-09-29 DIAGNOSIS — E440 Moderate protein-calorie malnutrition: Secondary | ICD-10-CM

## 2022-09-29 DIAGNOSIS — M791 Myalgia, unspecified site: Secondary | ICD-10-CM

## 2022-09-29 DIAGNOSIS — F17210 Nicotine dependence, cigarettes, uncomplicated: Secondary | ICD-10-CM

## 2022-09-29 DIAGNOSIS — M5416 Radiculopathy, lumbar region: Secondary | ICD-10-CM

## 2022-09-29 DIAGNOSIS — G4709 Other insomnia: Secondary | ICD-10-CM

## 2022-09-29 MED ORDER — CARVEDILOL 3.125 MG PO TABS: ORAL_TABLET | Freq: Two times a day (BID) | ORAL | 1 refills | Status: DC

## 2022-09-29 MED ORDER — DULOXETINE HCL 60 MG PO CPEP: ORAL_CAPSULE | Freq: Every day | ORAL | 1 refills | Status: DC

## 2022-09-29 MED ORDER — VALSARTAN 40 MG PO TABS: 40.0000 mg | ORAL_TABLET | Freq: Every day | ORAL | 1 refills | Status: DC

## 2022-09-29 MED ORDER — MIRTAZAPINE 30 MG PO TABS: ORAL_TABLET | ORAL | 1 refills | Status: DC

## 2022-09-29 MED ORDER — AMLODIPINE BESYLATE 10 MG PO TABS: ORAL_TABLET | ORAL | 1 refills | Status: DC

## 2022-09-29 MED ORDER — TIZANIDINE HCL 4 MG PO TABS: 4.0000 mg | ORAL_TABLET | Freq: Every evening | ORAL | 1 refills | Status: DC | PRN

## 2022-09-29 MED ORDER — BUPROPION HCL ER (XL) 150 MG PO TB24: 150.0000 mg | ORAL_TABLET | Freq: Every day | ORAL | 1 refills | Status: DC

## 2022-09-29 NOTE — Patient Instructions (Signed)
Influenza (Flu) Vaccine (Inactivated or Recombinant): What You Need to Know 1. Why get vaccinated? Influenza vaccine can prevent influenza (flu). Flu is a contagious disease that spreads around the [place] every year, usually between October and May. Anyone can get the flu, but it is more dangerous for some people. Infants and young children, people 65 years and older, pregnant people, and people with certain health conditions or a weakened immune system are at greatest risk of flu complications. Pneumonia, bronchitis, sinus infections, and ear infections are examples of flu-related complications. If you have a medical condition, such as heart disease, cancer, or diabetes, flu can make it worse. Flu can cause fever and chills, sore throat, muscle aches, fatigue, cough, headache, and runny or stuffy nose. Some people may have vomiting and diarrhea, though this is more common in children than adults. In an average year, thousands of people in the [place] die from flu, and many more are hospitalized. Flu vaccine prevents millions of illnesses and flu-related visits to the doctor each year. 2. Influenza vaccines CDC recommends everyone 6 months and older get vaccinated every flu season. Children 6 months through 8 years of age may need 2 doses during a single flu season. Everyone else needs only 1 dose each flu season. It takes about 2 weeks for protection to develop after vaccination. There are many flu viruses, and they are always changing. Each year a new flu vaccine is made to protect against the influenza viruses believed to be likely to cause disease in the upcoming flu season. Even when the vaccine doesn't exactly match these viruses, it may still provide some protection. Influenza vaccine does not cause flu. Influenza vaccine may be given at the same time as other vaccines. 3. Talk with your health care provider Tell your vaccination provider if the person getting the vaccine: Has had  an allergic reaction after a previous dose of influenza vaccine, or has any severe, life-threatening allergies Has ever had Guillain-Barr Syndrome (also called "GBS") In some cases, your health care provider may decide to postpone influenza vaccination until a future visit. Influenza vaccine can be administered at any time during pregnancy. People who are or will be pregnant during influenza season should receive inactivated influenza vaccine. People with minor illnesses, such as a cold, may be vaccinated. People who are moderately or severely ill should usually wait until they recover before getting influenza vaccine. Your health care provider can give you more information. 4. Risks of a vaccine reaction Soreness, redness, and swelling where the shot is given, fever, muscle aches, and headache can happen after influenza vaccination. There may be a very small increased risk of Guillain-Barr Syndrome (GBS) after inactivated influenza vaccine (the flu shot). Young children who get the flu shot along with pneumococcal vaccine (PCV13) and/or DTaP vaccine at the same time might be slightly more likely to have a seizure caused by fever. Tell your health care provider if a child who is getting flu vaccine has ever had a seizure. People sometimes faint after medical procedures, including vaccination. Tell your provider if you feel dizzy or have vision changes or ringing in the ears. As with any medicine, there is a very remote chance of a vaccine causing a severe allergic reaction, other serious injury, or death. 5. What if there is a serious problem? An allergic reaction could occur after the vaccinated person leaves the clinic. If you see signs of a severe allergic reaction (hives, swelling of the face and throat, difficulty breathing,   a fast heartbeat, dizziness, or weakness), call 9-1-1 and get the person to the nearest hospital. For other signs that concern you, call your health care provider. Adverse  reactions should be reported to the Vaccine Adverse Event Reporting System (VAERS). Your health care provider will usually file this report, or you can do it yourself. Visit the VAERS website at www.vaers.hhs.gov or call 1-800-822-7967. VAERS is only for reporting reactions, and VAERS staff members do not give medical advice. 6. The National Vaccine Injury Compensation Program The National Vaccine Injury Compensation Program (VICP) is a federal program that was created to compensate people who may have been injured by certain vaccines. Claims regarding alleged injury or death due to vaccination have a time limit for filing, which may be as short as two years. Visit the VICP website at www.hrsa.gov/vaccinecompensation or call 1-800-338-2382 to learn about the program and about filing a claim. 7. How can I learn more? Ask your health care provider. Call your local or state health department. Visit the website of the Food and Drug Administration (FDA) for vaccine package inserts and additional information at www.fda.gov/vaccines-blood-biologics/vaccines. Contact the Centers for Disease Control and Prevention (CDC): Call 1-800-232-4636 (1-800-CDC-INFO) or Visit CDC's website at www.cdc.gov/flu. Source: CDC Vaccine Information Statement Inactivated Influenza Vaccine (05/18/2020) This same material is available at www.cdc.gov for no charge. This information is not intended to replace advice given to you by your health care provider. Make sure you discuss any questions you have with your health care provider. Document Revised: 08/27/2021 Document Reviewed: 06/20/2021 Elsevier Patient Education  2023 [employer]  

## 2022-09-29 NOTE — Progress Notes (Signed)
Had a fall last month, having pain in left shoulder and hips.

## 2022-09-29 NOTE — Progress Notes (Signed)
Subjective:  Patient ID: Tammy Schwartz, female    DOB: 03/07/1956  Age: 66 y.o. MRN: [id number]  CC: Hypertension   HPI Tammy Schwartz is a 66 y.o. year old female with a history of hypertension, tobacco abuse (greater than 20-pack-year history), COPD, lumbar spondylosis status post back surgery who presents today for follow-up visit.   Interval History:  One month ago at a football game she fell and currently has pain in her knees, shoulder, neck muscles and has been hurting.  She did not suffer any bruises, did not hit her head and has no swelling.  Currently taking naproxen. He does have chronic back pain for which she is on a muscle relaxant and duloxetine.  Her COPD is stable but she has been having her sinuses drain for which she takes her regular antihistamine.  She states her appetite is great but she is still unable to gain weight.  She remains on Remeron.  Malignancy workup so far has been negative. She is due for repeat low-dose lung cancer screening.  Continues to smoke and has smoked greater than 20-pack-year history in total and has been on Wellbutrin.  Her blood pressure is elevated and she endorses taking her antihypertensive. Past Medical History:  Diagnosis Date   Chronic back pain    HNp,spondylosis,radiculopathy   COPD (chronic obstructive pulmonary disease) (HCC)    GERD (gastroesophageal reflux disease)    Hyperlipidemia    was on choloesterol meds last yr-samples given in office but not needed since   Hypertension    takes Exforge daily   Joint pain    Shortness of breath    takes Singulair daily;with exertion   Sickle cell trait (HCC)    Spondylolysis    Tobacco use     Past Surgical History:  Procedure Laterality Date   BACK SURGERY     BREAST EXCISIONAL BIOPSY Left 2007   benign   BREAST SURGERY Left    lumpectomy   COLONOSCOPY     DILATION AND CURETTAGE OF UTERUS     LUMBAR LAMINECTOMY/DECOMPRESSION MICRODISCECTOMY   06/04/2012   Procedure: LUMBAR LAMINECTOMY/DECOMPRESSION MICRODISCECTOMY 1 LEVEL;  Surgeon: Carmela Hurt, MD;  Location: [facility];  Service: Neurosurgery;  Laterality: Left;  LEFT Lumbar five-sacral one diskectomy   LUMBAR LAMINECTOMY/DECOMPRESSION MICRODISCECTOMY Right 06/16/2013   Procedure: RIGHT Lumbar Five-Sacral One Microdiskectomy;  Surgeon: Carmela Hurt, MD;  Location: [facility];  Service: Neurosurgery;  Laterality: Right;  RIGHT Lumbar Five-Sacral One Microdiskectomy   LUMBAR LAMINECTOMY/DECOMPRESSION MICRODISCECTOMY Right 01/20/2014   Procedure: LUMBAR FOUR TO LUMBAR FIVE LUMBAR LAMINECTOMY/DECOMPRESSION MICRODISCECTOMY 1 LEVEL;  Surgeon: Carmela Hurt, MD;  Location: [facility];  Service: Neurosurgery;  Laterality: Right;  Right L45 laminectomy and foramintomy    Family History  Problem Relation Age of Onset   Stomach cancer Mother    Cancer - Other Mother    CAD Father        Died age 6   Hypertension Sister     Social History   Socioeconomic History   Marital status: Married    Spouse name: Not on file   Number of children: Not on file   Years of education: Not on file   Highest education level: Not on file  Occupational History   Not on file  Tobacco Use   Smoking status: Every Day    Packs/day: 0.25    Years: 20.00    Total pack years: 5.00    Types: Cigarettes  Smokeless tobacco: Never  Vaping Use   Vaping Use: Never used  Substance and Sexual Activity   Alcohol use: No   Drug use: No   Sexual activity: Yes    Birth control/protection: Post-menopausal  Other Topics Concern   Not on file  Social History Narrative   Lives with husband and son.    Social Determinants of Health   Financial Resource Strain: High Risk (04/23/2021)   Overall Financial Resource Strain (CARDIA)    Difficulty of Paying Living Expenses: Hard  Food Insecurity: No Food Insecurity (04/23/2021)   Hunger Vital Sign    Worried About Running Out of Food in the Last Year:  Never true    Ran Out of Food in the Last Year: Never true  Transportation Needs: No Transportation Needs (04/23/2021)   PRAPARE - Administrator, Civil Service (Medical): No    Lack of Transportation (Non-Medical): No  Physical Activity: Inactive (04/23/2021)   Exercise Vital Sign    Days of Exercise per Week: 0 days    Minutes of Exercise per Session: 0 min  Stress: Stress Concern Present (04/23/2021)   [employer] of Occupational Health - Occupational Stress Questionnaire    Feeling of Stress : To some extent  Social Connections: Socially Integrated (04/23/2021)   Social Connection and Isolation Panel [NHANES]    Frequency of Communication with Friends and Family: More than three times a week    Frequency of Social Gatherings with Friends and Family: More than three times a week    Attends Religious Services: More than 4 times per year    Active Member of [employer] or Organizations: Yes    Attends Engineer, structural: More than 4 times per year    Marital Status: Married    No Known Allergies  Outpatient Medications Prior to Visit  Medication Sig Dispense Refill   albuterol (VENTOLIN HFA) 108 (90 Base) MCG/ACT inhaler INHALE 2 PUFFS INTO THE LUNGS EVERY 6 HOURS AS NEEDED 36 g 2   aspirin EC 81 MG tablet Take 1 tablet (81 mg total) by mouth daily. Swallow whole. 100 tablet 3   azelastine (ASTELIN) 0.1 % nasal spray Place 2 sprays into both nostrils 2 (two) times daily. 30 mL 0   cetirizine (ZYRTEC) 10 MG tablet TAKE 1 TABLET BY MOUTH DAILY. 90 tablet 1   meclizine (ANTIVERT) 25 MG tablet Take 1 tablet (25 mg total) by mouth 3 (three) times daily as needed for dizziness. 60 tablet 1   montelukast (SINGULAIR) 10 MG tablet Take 10 mg by mouth daily.     Multiple Vitamin (MULTIVITAMIN WITH MINERALS) TABS tablet Take 1 tablet by mouth daily. 30 tablet 3   naproxen (NAPROSYN) 375 MG tablet Take 1 tablet (375 mg total) by mouth 2 (two) times daily. 10 tablet 0    pantoprazole (PROTONIX) 40 MG tablet TAKE 1 TABLET(40 MG) BY MOUTH DAILY. 90 tablet 0   polyethylene glycol powder (GLYCOLAX/MIRALAX) powder Take 17 g by mouth daily. 3350 g 1   potassium chloride SA (KLOR-CON M) 20 MEQ tablet Take 1 tablet (20 mEq total) by mouth 2 (two) times daily. For 5 days then changed to 1 tablet daily 40 tablet 3   rosuvastatin (CRESTOR) 10 MG tablet Take 1 tablet (10 mg total) by mouth daily. 90 tablet 1   triamcinolone cream (KENALOG) 0.1 % APPLY 1 APPLICATION TOPICALLY 2 (TWO) TIMES DAILY. 80 g 0   amLODipine (NORVASC) 10 MG tablet TAKE 1 TABLET(10 MG)  BY MOUTH DAILY 90 tablet 0   buPROPion (WELLBUTRIN XL) 150 MG 24 hr tablet TAKE 1 TABLET(150 MG) BY MOUTH DAILY FOR SMOKING CESSATION 30 tablet 0   carvedilol (COREG) 3.125 MG tablet TAKE 1 TABLET (3.125 MG TOTAL) BY MOUTH 2 (TWO) TIMES DAILY WITH A MEAL. 180 tablet 1   DULoxetine (CYMBALTA) 60 MG capsule TAKE 1 CAPSULE (60 MG TOTAL) BY MOUTH DAILY. 90 capsule 1   lactulose (CHRONULAC) 10 GM/15ML solution TAKE 15 ML BY MOUTH TWICE DAILY AS NEEDED FOR CONSTIPATION 2746 mL 0   mirtazapine (REMERON) 30 MG tablet TAKE 1 TABLET(30 MG) BY MOUTH AT BEDTIME 90 tablet 0   tiZANidine (ZANAFLEX) 4 MG tablet Take 1 tablet (4 mg total) by mouth every 8 (eight) hours as needed for muscle spasms. 20 tablet 0   oxyCODONE-acetaminophen (PERCOCET/ROXICET) 5-325 MG tablet Take 1 tablet by mouth every 8 (eight) hours as needed for severe pain. (Patient not taking: Reported on 03/14/2022) 8 tablet 0   benzonatate (TESSALON) 100 MG capsule TAKE 1 CAPSULE(100 MG) BY MOUTH TWICE DAILY (Patient not taking: Reported on 09/29/2022) 30 capsule 0   dextromethorphan-guaiFENesin (MUCINEX DM) 30-600 MG 12hr tablet Take 1 tablet by mouth 2 (two) times daily. (Patient not taking: Reported on 09/29/2022) 20 tablet 0   No facility-administered medications prior to visit.     ROS Review of Systems  Constitutional:  Negative for activity change and appetite  change.  HENT:  Negative for sinus pressure and sore throat.   Respiratory:  Negative for chest tightness, shortness of breath and wheezing.   Cardiovascular:  Negative for chest pain and palpitations.  Gastrointestinal:  Negative for abdominal distention, abdominal pain and constipation.  Genitourinary: Negative.   Musculoskeletal:        See HPI  Psychiatric/Behavioral:  Negative for behavioral problems and dysphoric mood.     Objective:  BP (!) 179/79   Pulse 64   Temp 98.3 F (36.8 C) (Oral)   Ht  (1.549 m)   Wt 82 lb 3.2 oz (37.3 kg)   LMP 03/13/2006   SpO2 100%   BMI 15.53 kg/m      09/29/2022    3:37 PM 03/14/2022    3:53 PM 03/14/2022    2:51 PM  BP/Weight  Systolic BP 179 148 147  Diastolic BP 79 83 73  Wt. (Lbs) 82.2  87  BMI 15.53 kg/m2  16.44 kg/m2    Wt Readings from Last 3 Encounters:  09/29/22 82 lb 3.2 oz (37.3 kg)  03/14/22 87 lb (39.5 kg)  01/09/22 85 lb 15.7 oz (39 kg)      Physical Exam Constitutional:      Appearance: She is well-developed.  Cardiovascular:     Rate and Rhythm: Normal rate.     Heart sounds: Normal heart sounds. No murmur heard. Pulmonary:     Effort: Pulmonary effort is normal.     Breath sounds: Normal breath sounds. No wheezing or rales.  Chest:     Chest wall: No tenderness.  Abdominal:     General: Bowel sounds are normal. There is no distension.     Palpations: Abdomen is soft. There is no mass.     Tenderness: There is no abdominal tenderness.  Musculoskeletal:        General: Normal range of motion.     Cervical back: Tenderness (on patient of sternocleidomastoid and range of motion neck) present.     Right lower leg: No edema.  Left lower leg: No edema.     Comments: Slight tenderness on palpation and range of motion of the knees.  Neurological:     Mental Status: She is alert and oriented to person, place, and time.  Psychiatric:        Mood and Affect: Mood normal.        Latest Ref Rng &  Units 12/13/2021    2:14 PM 12/02/2021   12:20 PM 08/09/2021   12:06 PM  CMP  Glucose 70 - 99 mg/dL 77  83  80   BUN 8 - 27 mg/dL 8  7  10    Creatinine 0.57 - 1.00 mg/dL  1.61  0.96   Sodium 134 - 144 mmol/L 146  148  143   Potassium 3.5 - 5.2 mmol/L 3.6  2.8  3.5   Chloride 96 - 106 mmol/L 109  106  103   CO2 20 - 29 mmol/L 21  21  23    Calcium 8.7 - 10.3 mg/dL 9.4  9.3  9.9   Total Protein 6.0 - 8.5 g/dL  7.3    Total Bilirubin 0.0 - 1.2 mg/dL  0.3    Alkaline Phos 44 - 121 IU/L  101    AST 0 - 40 IU/L  37    ALT 0 - 32 IU/L  34      Lipid Panel     Component Value Date/Time   CHOL 160 12/02/2021 1220   TRIG 49 12/02/2021 1220   HDL 122 12/02/2021 1220   CHOLHDL 2.0 01/01/2018 1223   CHOLHDL 2.3 05/07/2016 0739   VLDL 20 05/07/2016 0739   LDLCALC 27 12/02/2021 1220    CBC    Component Value Date/Time   WBC 5.5 12/02/2021 1220   WBC 5.0 07/31/2021 1022   RBC 4.61 12/02/2021 1220   RBC 4.87 07/31/2021 1022   HGB 13.8 12/02/2021 1220   HCT 42.1 12/02/2021 1220   PLT 258 12/02/2021 1220   MCV 91 12/02/2021 1220   MCH 29.9 12/02/2021 1220   MCH 29.0 02/23/2020 1838   MCHC 32.8 12/02/2021 1220   MCHC 33.2 07/31/2021 1022   RDW 15.8 (H) 12/02/2021 1220   LYMPHSABS 2.1 12/02/2021 1220   MONOABS 0.4 07/31/2021 1022   EOSABS 0.1 12/02/2021 1220   BASOSABS 0.0 12/02/2021 1220    Lab Results  Component Value Date   HGBA1C 5.7 (H) 12/02/2021    Lab Results  Component Value Date   TSH 0.778 12/02/2021    Assessment & Plan:  1. Myalgia Secondary to fall Placed on tizanidine Advised to apply warm compress - tiZANidine (ZANAFLEX) 4 MG tablet; Take 1 tablet (4 mg total) by mouth at bedtime as needed for muscle spasms.  Dispense: 30 tablet; Refill: 1  2. Moderate protein-calorie malnutrition (HCC) Ongoing weight loss despite good appetite CT abdomen, pelvis previous lung CT negative for malignancy Thyroid labs Within normal limit  She is of the opinion that  ever since she received COVID-vaccine she started to lose weight - Cancer Antigen 19-9; Future - CA 125; Future  3. Essential hypertension Uncontrolled Valsartan added to regimen She will follow-up with the clinical pharmacist and at that visit have labs to check her potassium.  Her potassium has usually been on the lower limit of normal and she has been on potassium replacement. Counseled on blood pressure goal of less than 130/80, low-sodium, DASH diet, medication compliance, 150 minutes of moderate intensity exercise per week. Discussed medication compliance, adverse  effects. - valsartan (DIOVAN) 40 MG tablet; Take 1 tablet (40 mg total) by mouth daily.  Dispense: 90 tablet; Refill: 1 - Basic Metabolic Panel; Future - amLODipine (NORVASC) 10 MG tablet; TAKE 1 TABLET(10 MG) BY MOUTH DAILY  Dispense: 90 tablet; Refill: 1 - carvedilol (COREG) 3.125 MG tablet; TAKE 1 TABLET (3.125 MG TOTAL) BY MOUTH 2 (TWO) TIMES DAILY WITH A MEAL.  Dispense: 180 tablet; Refill: 1  4. Lumbar radiculopathy Stable - DULoxetine (CYMBALTA) 60 MG capsule; TAKE 1 CAPSULE (60 MG TOTAL) BY MOUTH DAILY.  Dispense: 90 capsule; Refill: 1  5. Other insomnia Controlled - mirtazapine (REMERON) 30 MG tablet; TAKE 1 TABLET(30 MG) BY MOUTH AT BEDTIME  Dispense: 90 tablet; Refill: 1  6. Smoking greater than 20 pack years Spent 3 minutes counseling on smoking cessation and she is not ready to quit Continue Wellbutrin - CT CHEST LUNG CANCER SCREENING LOW DOSE WO CONTRAST; Future - buPROPion (WELLBUTRIN XL) 150 MG 24 hr tablet; Take 1 tablet (150 mg total) by mouth daily.  Dispense: 90 tablet; Refill: 1  7. Flu vaccine need - Flu Vaccine QUAD High Dose(Fluad)    Meds ordered this encounter  Medications   tiZANidine (ZANAFLEX) 4 MG tablet    Sig: Take 1 tablet (4 mg total) by mouth at bedtime as needed for muscle spasms.    Dispense:  30 tablet    Refill:  1   valsartan (DIOVAN) 40 MG tablet    Sig: Take 1 tablet  (40 mg total) by mouth daily.    Dispense:  90 tablet    Refill:  1   amLODipine (NORVASC) 10 MG tablet    Sig: TAKE 1 TABLET(10 MG) BY MOUTH DAILY    Dispense:  90 tablet    Refill:  1   buPROPion (WELLBUTRIN XL) 150 MG 24 hr tablet    Sig: Take 1 tablet (150 mg total) by mouth daily.    Dispense:  90 tablet    Refill:  1   carvedilol (COREG) 3.125 MG tablet    Sig: TAKE 1 TABLET (3.125 MG TOTAL) BY MOUTH 2 (TWO) TIMES DAILY WITH A MEAL.    Dispense:  180 tablet    Refill:  1   DULoxetine (CYMBALTA) 60 MG capsule    Sig: TAKE 1 CAPSULE (60 MG TOTAL) BY MOUTH DAILY.    Dispense:  90 capsule    Refill:  1   mirtazapine (REMERON) 30 MG tablet    Sig: TAKE 1 TABLET(30 MG) BY MOUTH AT BEDTIME    Dispense:  90 tablet    Refill:  1    Follow-up: Return in about 2 weeks (around 10/13/2022) for Blood pressure follow-up with Franky MachoLuke, Medical conditions with PCP in 6 months.       Hoy RegisterEnobong Corazon Nickolas, MD, FAAFP. [facility] [place], [place] [phone] [id number]   09/29/2022, 5:09 PM

## 2022-10-14 NOTE — Progress Notes (Unsigned)
Cardiology Office Note   Date:  10/15/2022   ID:  Tammy Schwartz, DOB 1955-11-09, MRN [id number]  PCP:  Charlott Rakes, MD  Cardiologist:   Dorris Carnes, MD   Patient referred for eval of CAD, calcifications seen on CT   History of Present Illness: Tammy Schwartz is a 67 y.o. female with a hx of tobacco abuse (continues to smoke), COPD. And CAD (seen as calcifications onf CT scan, extensive in LM, LAD)  Pt alo has a hx of wt loss (initial 120 lb; seen by GI)   She has a hx of burning in chest    Found to have ulcers     CT coronary angio in Nov 2022 shows mild to mod dz   no flow limitiing    Since she was in clinic last she denies burning in chest   She says her breathing is OK   Occasional dizziness WT is still going down   Now at 85lbs      I saw the pt in March 2023 Current Meds  Medication Sig   albuterol (VENTOLIN HFA) 108 (90 Base) MCG/ACT inhaler INHALE 2 PUFFS INTO THE LUNGS EVERY 6 HOURS AS NEEDED   amLODipine (NORVASC) 10 MG tablet TAKE 1 TABLET(10 MG) BY MOUTH DAILY   aspirin EC 81 MG tablet Take 1 tablet (81 mg total) by mouth daily. Swallow whole.   azelastine (ASTELIN) 0.1 % nasal spray Place 2 sprays into both nostrils 2 (two) times daily.   buPROPion (WELLBUTRIN XL) 150 MG 24 hr tablet Take 1 tablet (150 mg total) by mouth daily.   carvedilol (COREG) 3.125 MG tablet TAKE 1 TABLET (3.125 MG TOTAL) BY MOUTH 2 (TWO) TIMES DAILY WITH A MEAL.   cetirizine (ZYRTEC) 10 MG tablet TAKE 1 TABLET BY MOUTH DAILY.   meclizine (ANTIVERT) 25 MG tablet Take 1 tablet (25 mg total) by mouth 3 (three) times daily as needed for dizziness.   mirtazapine (REMERON) 30 MG tablet TAKE 1 TABLET(30 MG) BY MOUTH AT BEDTIME   montelukast (SINGULAIR) 10 MG tablet Take 10 mg by mouth daily.   Multiple Vitamin (MULTIVITAMIN WITH MINERALS) TABS tablet Take 1 tablet by mouth daily.   naproxen (NAPROSYN) 375 MG tablet Take 1 tablet (375 mg total) by mouth 2 (two) times daily.    oxyCODONE-acetaminophen (PERCOCET/ROXICET) 5-325 MG tablet Take 1 tablet by mouth every 8 (eight) hours as needed for severe pain.   pantoprazole (PROTONIX) 40 MG tablet TAKE 1 TABLET(40 MG) BY MOUTH DAILY.   polyethylene glycol powder (GLYCOLAX/MIRALAX) powder Take 17 g by mouth daily.   potassium chloride SA (KLOR-CON M) 20 MEQ tablet Take 1 tablet (20 mEq total) by mouth 2 (two) times daily. For 5 days then changed to 1 tablet daily   rosuvastatin (CRESTOR) 10 MG tablet Take 1 tablet (10 mg total) by mouth daily.   tiZANidine (ZANAFLEX) 4 MG tablet Take 1 tablet (4 mg total) by mouth at bedtime as needed for muscle spasms.   triamcinolone cream (KENALOG) 0.1 % APPLY 1 APPLICATION TOPICALLY 2 (TWO) TIMES DAILY.   valsartan (DIOVAN) 40 MG tablet Take 1 tablet (40 mg total) by mouth daily.     Allergies:   Patient has no known allergies.   Past Medical History:  Diagnosis Date   Chronic back pain    HNp,spondylosis,radiculopathy   COPD (chronic obstructive pulmonary disease) (HCC)    GERD (gastroesophageal reflux disease)    Hyperlipidemia    was on choloesterol meds  last yr-samples given in office but not needed since   Hypertension    takes Exforge daily   Joint pain    Shortness of breath    takes Singulair daily;with exertion   Sickle cell trait ([place])    Spondylolysis    Tobacco use     Past Surgical History:  Procedure Laterality Date   BACK SURGERY     BREAST EXCISIONAL BIOPSY Left 2007   benign   BREAST SURGERY Left    lumpectomy   COLONOSCOPY     DILATION AND CURETTAGE OF UTERUS     LUMBAR LAMINECTOMY/DECOMPRESSION MICRODISCECTOMY  06/04/2012   Procedure: LUMBAR LAMINECTOMY/DECOMPRESSION MICRODISCECTOMY 1 LEVEL;  Surgeon: Winfield Cunas, MD;  Location: [place] [facility];  Service: Neurosurgery;  Laterality: Left;  LEFT Lumbar five-sacral one diskectomy   LUMBAR LAMINECTOMY/DECOMPRESSION MICRODISCECTOMY Right 06/16/2013   Procedure: RIGHT Lumbar Five-Sacral One  Microdiskectomy;  Surgeon: Winfield Cunas, MD;  Location: [place] [facility];  Service: Neurosurgery;  Laterality: Right;  RIGHT Lumbar Five-Sacral One Microdiskectomy   LUMBAR LAMINECTOMY/DECOMPRESSION MICRODISCECTOMY Right 01/20/2014   Procedure: LUMBAR FOUR TO LUMBAR FIVE LUMBAR LAMINECTOMY/DECOMPRESSION MICRODISCECTOMY 1 LEVEL;  Surgeon: Winfield Cunas, MD;  Location: [place] [facility];  Service: Neurosurgery;  Laterality: Right;  Right L45 laminectomy and foramintomy     Social History:  The patient  reports that she has been smoking cigarettes. She has a 5.00 pack-year smoking history. She has never used smokeless tobacco. She reports that she does not drink alcohol and does not use drugs.   Family History:  The patient's family history includes CAD in her father; Cancer - Other in her mother; Hypertension in her sister; Stomach cancer in her mother.    ROS:  Please see the history of present illness. All other systems are reviewed and  Negative to the above problem except as noted.    PHYSICAL EXAM: VS:  BP (!) 194/92   Pulse (!) 57   Ht 5\' 1"  (1.549 m)   Wt 83 lb (37.6 kg)   LMP 03/13/2006   SpO2 97%   BMI 15.68 kg/m   GEN: Thin 67 yo  in no acute distress  HEENT: normal  Neck: no JVD, carotid bruits Cardiac: RRR; no murmurs No LE  edema  Respiratory:  Bilateral rhonchi   Decrased airflow   GI: soft, nontender, nondistended, + BS  No hepatomegaly  MS: no deformity Moving all extremities   Skin: warm and dry, no rash Neuro:  Strength and sensation are intact Psych: euthymic mood, full affect   EKG:  EKG is not done   CT scan   08/2021  Coronary Arteries:  Normal coronary origin.  Right dominance.   RCA is a large dominant artery that gives rise to PDA and PLA. There is mixed plaque in the proximal RCA causing 0-24% stenosis.   Left main is a large artery that gives rise to LAD and LCX arteries. There is calcified plaque in the left main causing 0-24% stenosis   LAD is a  large vessel. There is calcified plaque in the proximal LAD causing 0-24% stenosis. There is calcified plaque in the proximal to mid LAD causing 25-49% stenosis   LCX is a non-dominant artery that gives rise to one large OM1 branch. There is calcified plaque in the mid LCX causing 0-24% stenosis   Other findings:   Left Ventricle: Normal size   Left Atrium: Normal size   Pulmonary Veins: Normal configuration   Right Ventricle: Normal size  Right Atrium: Normal size   Cardiac valves: No calcifications   Thoracic aorta: Normal size   Pulmonary Arteries: Normal size   Systemic Veins: Normal drainage   Pericardium: Normal thickness   IMPRESSION: 1. Coronary calcium score of 180. This was 89th percentile for age and sex matched control.   2. Normal coronary origin with right dominance.   3. Nonobstructive CAD. Most significant lesion is calcified plaque in the proximal to mid LAD causing mild (25-49%) stenosis   CAD-RADS 2. Mild non-obstructive CAD (25-49%). Consider non-atherosclerotic causes of chest pain. Consider preventive therapy and risk factor modification. Lipid Panel    Component Value Date/Time   CHOL 160 12/02/2021 1220   TRIG 49 12/02/2021 1220   HDL 122 12/02/2021 1220   CHOLHDL 2.0 01/01/2018 1223   CHOLHDL 2.3 05/07/2016 0739   VLDL 20 05/07/2016 0739   LDLCALC 27 12/02/2021 1220      Wt Readings from Last 3 Encounters:  10/15/22 83 lb (37.6 kg)  09/29/22 82 lb 3.2 oz (37.3 kg)  03/14/22 87 lb (39.5 kg)      ASSESSMENT AND PLAN:  1  CAD  NO symptoms of angina    I think the burnig she had in pasat was GI   2   COPD  Signifcant  counselled on quitting   3  Lipdis   LDL 27  HDL 122  Trig 49   Follow   4  GI  Concerning as sttill losing wt   Follows with GI and IM      Current medicines are reviewed at length with the patient today.  The patient does not have concerns regarding medicines.  Signed, Dietrich Pates, MD  10/15/2022 4:06  PM    [facility] [address] [place], [place], [place]  [address] Phone: [phone]; Fax: [phone]

## 2022-10-15 ENCOUNTER — Encounter: Payer: Self-pay | Admitting: Internal Medicine

## 2022-10-15 ENCOUNTER — Ambulatory Visit: Payer: Medicare Other | Attending: Internal Medicine | Admitting: Internal Medicine

## 2022-10-15 VITALS — BP 194/92 | HR 57 | Ht 61.0 in | Wt 83.0 lb

## 2022-10-15 DIAGNOSIS — I10 Essential (primary) hypertension: Secondary | ICD-10-CM

## 2022-10-15 DIAGNOSIS — I2510 Atherosclerotic heart disease of native coronary artery without angina pectoris: Secondary | ICD-10-CM | POA: Diagnosis not present

## 2022-10-15 DIAGNOSIS — E785 Hyperlipidemia, unspecified: Secondary | ICD-10-CM

## 2022-10-15 MED ORDER — HYDRALAZINE HCL 25 MG PO TABS: 25.0000 mg | ORAL_TABLET | Freq: Three times a day (TID) | ORAL | 3 refills | Status: DC

## 2022-10-15 NOTE — Patient Instructions (Signed)
Medication Instructions:  Your physician has recommended you make the following change in your medication:  1) START taking hydralazine 25 mg three times per day  *If you need a refill on your cardiac medications before your next appointment, please call your pharmacy*   Lab Work: TODAY: BMET If you have labs (blood work) drawn today and your tests are completely normal, you will receive your results only by: [facility] (if you have MyChart) OR A paper copy in the mail If you have any lab test that is abnormal or we need to change your treatment, we will call you to review the results.  Follow-Up: At [facility], you and your health needs are our priority.  As part of our continuing mission to provide you with exceptional heart care, we have created designated Provider Care Teams.  These Care Teams include your primary Cardiologist (physician) and Advanced Practice Providers (APPs -  Physician Assistants and Nurse Practitioners) who all work together to provide you with the care you need, when you need it.  Your next appointment:   3-4 month(s)  The format for your next appointment:   In Person  Provider:   Dorris Carnes, MD  Other Instructions You have been referred to see PharmD in the Hypertension Clinic in 4 weeks.   Important Information About Sugar

## 2022-10-16 ENCOUNTER — Telehealth: Payer: Self-pay

## 2022-10-16 ENCOUNTER — Telehealth: Payer: Self-pay | Admitting: Internal Medicine

## 2022-10-16 DIAGNOSIS — E876 Hypokalemia: Secondary | ICD-10-CM

## 2022-10-16 LAB — BASIC METABOLIC PANEL
BUN/Creatinine Ratio: 11 — ABNORMAL LOW (ref 12–28)
BUN: 10 mg/dL (ref 8–27)
CO2: 23 mmol/L (ref 20–29)
Calcium: 9.1 mg/dL (ref 8.7–10.3)
Chloride: 105 mmol/L (ref 96–106)
Creatinine, Ser: 0.89 mg/dL (ref 0.57–1.00)
Glucose: 85 mg/dL (ref 70–99)
Potassium: 2.7 mmol/L — CL (ref 3.5–5.2)
Sodium: 144 mmol/L (ref 134–144)
eGFR: 71 mL/min/{1.73_m2} (ref >59)

## 2022-10-16 MED ORDER — SPIRONOLACTONE 25 MG PO TABS: 25.0000 mg | ORAL_TABLET | Freq: Every day | ORAL | 3 refills | Status: DC

## 2022-10-16 NOTE — Telephone Encounter (Signed)
Follow Up:       Patient is returning Ann's call.

## 2022-10-16 NOTE — Telephone Encounter (Signed)
-----   Message from Fay Records, MD sent at 10/16/2022  8:12 AM EST ----- Potassium is very low   She is on potassium supplement I would add 40 mEq (2 potassium) for 3 days    Then back to 1 daily I would also recomm adding aldactone 25 mg daily to regimen  Check BMET on Tuesday with Mg

## 2022-10-16 NOTE — Telephone Encounter (Signed)
Left a message for the pt to call back and sent her a My Chart message .

## 2022-10-16 NOTE — Telephone Encounter (Signed)
[facility] hung up before I got on the phone. Looked at patient's lab work from yesterday, Dr. Harrington Challenger has already reviewed. Here is Dr. Harrington Challenger' result note.   "Potassium is very low   She is on potassium supplement I would add 40 mEq (2 potassium) for 3 days    Then back to 1 daily I would also recomm adding aldactone 25 mg daily to regimen Check BMET on Tuesday with Mg"  Left message for patient to call back.

## 2022-10-16 NOTE — Telephone Encounter (Signed)
[facility] is calling to give critical lab results for patient

## 2022-10-16 NOTE — Telephone Encounter (Signed)
Left another message for the pt to call back.  

## 2022-10-17 NOTE — Telephone Encounter (Signed)
Late entry. I spoke with the pt early this morning and she verbalized understanding of her med instructions.

## 2022-10-21 ENCOUNTER — Other Ambulatory Visit: Payer: Medicare Other

## 2022-10-21 NOTE — Telephone Encounter (Signed)
error 

## 2022-10-23 ENCOUNTER — Ambulatory Visit: Payer: Medicare Other | Attending: Internal Medicine

## 2022-10-23 ENCOUNTER — Telehealth: Payer: Self-pay | Admitting: Internal Medicine

## 2022-10-23 DIAGNOSIS — Z79899 Other long term (current) drug therapy: Secondary | ICD-10-CM

## 2022-10-23 DIAGNOSIS — E876 Hypokalemia: Secondary | ICD-10-CM

## 2022-10-23 LAB — BASIC METABOLIC PANEL
BUN/Creatinine Ratio: 7 — ABNORMAL LOW (ref 12–28)
BUN: 6 mg/dL — ABNORMAL LOW (ref 8–27)
CO2: 23 mmol/L (ref 20–29)
Calcium: 9.4 mg/dL (ref 8.7–10.3)
Chloride: 108 mmol/L — ABNORMAL HIGH (ref 96–106)
Creatinine, Ser: 0.83 mg/dL (ref 0.57–1.00)
Glucose: 94 mg/dL (ref 70–99)
Potassium: 2.7 mmol/L — CL (ref 3.5–5.2)
Sodium: 146 mmol/L — ABNORMAL HIGH (ref 134–144)
eGFR: 77 mL/min/{1.73_m2} (ref >59)

## 2022-10-23 LAB — MAGNESIUM: Magnesium: 1.9 mg/dL (ref 1.6–2.3)

## 2022-10-23 MED ORDER — MAGNESIUM GLYCINATE 100 MG PO CAPS: 200.0000 mg | ORAL_CAPSULE | Freq: Every day | ORAL | 11 refills | Status: DC

## 2022-10-23 MED ORDER — POTASSIUM CHLORIDE CRYS ER 20 MEQ PO TBCR: 40.0000 meq | EXTENDED_RELEASE_TABLET | Freq: Every day | ORAL | 3 refills | Status: DC

## 2022-10-23 NOTE — Telephone Encounter (Signed)
Received critical lab report K 2.7.   Called pt reports takes potassium 20 mEq PO BID and Spironolactone 25 mg PO QD.    Spoke with DOD Dr. Marlou Porch.  Advised to have pt take potassium 40 mEq PO BID.  Magnesium Glycinate 200 mg PO QD and have repeat labs on 1/15.    Called pt advised of MD recommendations. Pt wrote down instructions.  Advised will have to get magnesium glycinate OTC have pharmacy staff help locate med.  Will have BMP 10/27/22.  Pt verbalizes understanding will send to primary cardiologist.

## 2022-10-23 NOTE — Telephone Encounter (Signed)
Sherry with [employer] with critical lab results

## 2022-10-24 ENCOUNTER — Telehealth: Payer: Self-pay | Admitting: Cardiology

## 2022-10-24 NOTE — Telephone Encounter (Signed)
Pt c/o medication issue:  1. Name of Medication: Magnesium Glycinate 200 mg Magnesium Succinate 100 MG  2. How are you currently taking this medication (dosage and times per day)? Not currently taking   3. Are you having a reaction (difficulty breathing--STAT)? No   4. What is your medication issue? Patient is calling stating her daughter is at the pharmacy to pick up the magnesium our office recommended for the patient. Patient reports the daughter informed her they do not have any magnesium glycinate and only have magnesium succinate and it is 100 mg's less than recommended. She is wanting to know if it's okay to get this form of magnesium instead. Please advise.

## 2022-10-27 ENCOUNTER — Ambulatory Visit: Payer: Medicare Other | Attending: Cardiology

## 2022-10-27 DIAGNOSIS — E876 Hypokalemia: Secondary | ICD-10-CM | POA: Diagnosis not present

## 2022-10-27 NOTE — Telephone Encounter (Signed)
Called pt to relay Dr. Rosezella [place] message. No answer, left information on machine.

## 2022-10-28 LAB — BASIC METABOLIC PANEL
BUN/Creatinine Ratio: 10 — ABNORMAL LOW (ref 12–28)
BUN: 8 mg/dL (ref 8–27)
CO2: 22 mmol/L (ref 20–29)
Calcium: 9.7 mg/dL (ref 8.7–10.3)
Chloride: 105 mmol/L (ref 96–106)
Creatinine, Ser: 0.84 mg/dL (ref 0.57–1.00)
Glucose: 84 mg/dL (ref 70–99)
Potassium: 5.4 mmol/L — ABNORMAL HIGH (ref 3.5–5.2)
Sodium: 141 mmol/L (ref 134–144)
eGFR: 76 mL/min/{1.73_m2} (ref >59)

## 2022-11-03 ENCOUNTER — Ambulatory Visit: Payer: Medicare Other | Admitting: Pharmacist

## 2022-11-18 ENCOUNTER — Other Ambulatory Visit: Payer: Self-pay | Admitting: *Deleted

## 2022-11-18 DIAGNOSIS — Z79899 Other long term (current) drug therapy: Secondary | ICD-10-CM

## 2022-11-18 DIAGNOSIS — I10 Essential (primary) hypertension: Secondary | ICD-10-CM

## 2022-11-24 ENCOUNTER — Ambulatory Visit: Payer: Medicare Other

## 2022-11-24 ENCOUNTER — Ambulatory Visit: Payer: Medicare Other | Attending: Internal Medicine | Admitting: Pharmacist

## 2022-11-24 VITALS — BP 140/70 | HR 71

## 2022-11-24 DIAGNOSIS — Z79899 Other long term (current) drug therapy: Secondary | ICD-10-CM

## 2022-11-24 DIAGNOSIS — I10 Essential (primary) hypertension: Secondary | ICD-10-CM | POA: Diagnosis not present

## 2022-11-24 NOTE — Patient Instructions (Signed)
Summary of today's discussion  1.Continue spironolactone 25 mg daily, amlodipine 10 mg daily, carvedilol 3.125 mg twice a day, hydralazine 25 mg 3 times a day  2. Increase your exercise - weights  3. I will call you tomorrow with your lab results  4.please check your blood pressure daily and bring your readings with you to our next appointment  5.   Your blood pressure goal is <130/80  To check your pressure at home you will need to:  1. Sit up in a chair, with feet flat on the floor and back supported. Do not cross your ankles or legs. 2. Rest your left arm so that the cuff is about heart level. If the cuff goes on your upper arm,  then just relax the arm on the table, arm of the chair or your lap. If you have a wrist cuff, we  suggest relaxing your wrist against your chest (think of it as Pledging the Flag with the  wrong arm).  3. Place the cuff snugly around your arm, about 1 inch above the crook of your elbow. The  cords should be inside the groove of your elbow.  4. Sit quietly, with the cuff in place, for about 5 minutes. After that 5 minutes press the power  button to start a reading. 5. Do not talk or move while the reading is taking place.  6. Record your readings on a sheet of paper. Although most cuffs have a memory, it is often  easier to see a pattern developing when the numbers are all in front of you.  7. You can repeat the reading after 1-3 minutes if it is recommended  Make sure your bladder is empty and you have not had caffeine or tobacco within the last 30 min  Always bring your blood pressure log with you to your appointments. If you have not brought your monitor in to be double checked for accuracy, please bring it to your next appointment.  You can find a list of validated (accurate) blood pressure cuffs at [employer]   Important lifestyle changes to control high blood pressure  Intervention  Effect on the BP  Lose extra pounds and watch your  waistline Weight loss is one of the most effective lifestyle changes for controlling blood pressure. If you're overweight or obese, losing even a small amount of weight can help reduce blood pressure. Blood pressure might go down by about 1 millimeter of mercury (mm Hg) with each kilogram (about 2.2 pounds) of weight lost.  Exercise regularly As a general goal, aim for at least 30 minutes of moderate physical activity every day. Regular physical activity can lower high blood pressure by about 5 to 8 mm Hg.  Eat a healthy diet Eating a diet rich in whole grains, fruits, vegetables, and low-fat dairy products and low in saturated fat and cholesterol. A healthy diet can lower high blood pressure by up to 11 mm Hg.  Reduce salt (sodium) in your diet Even a small reduction of sodium in the diet can improve heart health and reduce high blood pressure by about 5 to 6 mm Hg.  Limit alcohol One drink equals 12 ounces of beer, 5 ounces of wine, or 1.5 ounces of 80-proof liquor.  Limiting alcohol to less than one drink a day for women or two drinks a day for men can help lower blood pressure by about 4 mm Hg.   Please call me at [phone] with any questions.

## 2022-11-24 NOTE — Assessment & Plan Note (Addendum)
Assessment: Blood pressure above goal of <130/80 in clinic No home readings available Recheck K today- patient reports not taking valsartan Congratulated patient on quitting smoking Patient not checking blood pressure at home Exercising twice a week  Plan: Patient given BP cuff from pt care fund Start checking BP 1-2 times per day Follow up with labs  Follow up in clinic in 2 weeks Patient also has apt with PCP pharmacist. I advised pt that I would contact Lurena Joiner about canceling that apt.

## 2022-11-24 NOTE — Progress Notes (Signed)
Patient ID: Tammy Schwartz                 DOB: October 25, 1955                      MRN: [id number]      HPI: Tammy Schwartz is a 67 y.o. female referred by Dr. Harrington Challenger to HTN clinic. PMH is significant for  tobacco abuse, COPD and CAD (seen as calcifications onf CT scan, extensive in LM, LAD), PUD and HTN.   Patient seen by Dr. Harrington Challenger 10/15/2022.  Blood pressure was significantly elevated and hydralazine 25 mg 3 times per day was started.  BMP showed significantly low potassium at 2.7.  She was started on potassium supplement 20 mEq twice daily and spironolactone 25 mg daily.  Repeat potassium was still 2.7.  Potassium was increased to 40 mill equivalents twice daily.  And magnesium was added.  Repeat potassium was 5.4.  Patient presents today to HTN clinic. She does not check her BP at home. Does not have a BP cuff. She reports dizziness only if she bends over for a while. Denies headaches, blurred vision, SOB or swelling. She reports that Dr. Harrington Challenger told her to stop taking valsartan when she started hydralazine. I do not see any documentation of this. BMP was drawn today. Patient reports compliance with medications.  She has almost quit smoking. States that she lights them and then they just burn on the ash tray. Doesn't have a taste for them anymore. Is very proud of herself.    Current HTN meds: Valsartan 40 mg daily (patient not taking), spironolactone 25 mg daily, amlodipine 10 mg daily, carvedilol 3.125 mg twice a day, hydralazine 25 mg 3 times a day (breakfast, lunch, dinner) Previously tried: HCTZ BP goal: <130/80  Family History:  The patient's family history includes CAD in her father; Cancer - Other in her mother; Hypertension in her sister; Stomach cancer in her mother.    Social History: The patient  reports that she has been smoking cigarettes. She has a 5.00 pack-year smoking history. She has never used smokeless tobacco. She reports that she does not drink alcohol and  does not use drugs.    Diet: coffee (1 cup) soda 1 20 oz per day (coke or sunkissed) Chicken, beans, fish, ribs, pork chops Peas, green beans, broccoli  Exercise: exercise class on her phone twice per week Home exercise routine includes exercise video.  Home BP readings: none  Wt Readings from Last 3 Encounters:  10/15/22 83 lb (37.6 kg)  09/29/22 82 lb 3.2 oz (37.3 kg)  03/14/22 87 lb (39.5 kg)   BP Readings from Last 3 Encounters:  11/24/22 (!) 140/70  10/15/22 (!) 194/92  09/29/22 (!) 179/79   Pulse Readings from Last 3 Encounters:  11/24/22 71  10/15/22 (!) 57  09/29/22 64    Renal function: CrCl cannot be calculated (Patient's most recent lab result is older than the maximum 21 days allowed.).  Past Medical History:  Diagnosis Date   Chronic back pain    HNp,spondylosis,radiculopathy   COPD (chronic obstructive pulmonary disease) (HCC)    GERD (gastroesophageal reflux disease)    Hyperlipidemia    was on choloesterol meds last yr-samples given in office but not needed since   Hypertension    takes Exforge daily   Joint pain    Shortness of breath    takes Singulair daily;with exertion   Sickle cell trait ([place])  Spondylolysis    Tobacco use     Current Outpatient Medications on File Prior to Visit  Medication Sig Dispense Refill   albuterol (VENTOLIN HFA) 108 (90 Base) MCG/ACT inhaler INHALE 2 PUFFS INTO THE LUNGS EVERY 6 HOURS AS NEEDED 36 g 2   amLODipine (NORVASC) 10 MG tablet TAKE 1 TABLET(10 MG) BY MOUTH DAILY 90 tablet 1   aspirin EC 81 MG tablet Take 1 tablet (81 mg total) by mouth daily. Swallow whole. 100 tablet 3   azelastine (ASTELIN) 0.1 % nasal spray Place 2 sprays into both nostrils 2 (two) times daily. 30 mL 0   buPROPion (WELLBUTRIN XL) 150 MG 24 hr tablet Take 1 tablet (150 mg total) by mouth daily. 90 tablet 1   carvedilol (COREG) 3.125 MG tablet TAKE 1 TABLET (3.125 MG TOTAL) BY MOUTH 2 (TWO) TIMES DAILY WITH A MEAL. 180 tablet 1    cetirizine (ZYRTEC) 10 MG tablet TAKE 1 TABLET BY MOUTH DAILY. 90 tablet 1   hydrALAZINE (APRESOLINE) 25 MG tablet Take 1 tablet (25 mg total) by mouth 3 (three) times daily. 270 tablet 3   Magnesium Glycinate 100 MG CAPS Take 200 mg by mouth daily. 60 capsule 11   meclizine (ANTIVERT) 25 MG tablet Take 1 tablet (25 mg total) by mouth 3 (three) times daily as needed for dizziness. 60 tablet 1   mirtazapine (REMERON) 30 MG tablet TAKE 1 TABLET(30 MG) BY MOUTH AT BEDTIME 90 tablet 1   Multiple Vitamin (MULTIVITAMIN WITH MINERALS) TABS tablet Take 1 tablet by mouth daily. 30 tablet 3   naproxen (NAPROSYN) 375 MG tablet Take 1 tablet (375 mg total) by mouth 2 (two) times daily. 10 tablet 0   polyethylene glycol powder (GLYCOLAX/MIRALAX) powder Take 17 g by mouth daily. 3350 g 1   potassium chloride SA (KLOR-CON M) 20 MEQ tablet Take 2 tablets (40 mEq total) by mouth daily. (Patient taking differently: Take 20 mEq by mouth daily.) 90 tablet 3   rosuvastatin (CRESTOR) 10 MG tablet Take 1 tablet (10 mg total) by mouth daily. 90 tablet 1   spironolactone (ALDACTONE) 25 MG tablet Take 1 tablet (25 mg total) by mouth daily. 90 tablet 3   tiZANidine (ZANAFLEX) 4 MG tablet Take 1 tablet (4 mg total) by mouth at bedtime as needed for muscle spasms. 30 tablet 1   montelukast (SINGULAIR) 10 MG tablet Take 10 mg by mouth daily. (Patient not taking: Reported on 11/24/2022)     pantoprazole (PROTONIX) 40 MG tablet TAKE 1 TABLET(40 MG) BY MOUTH DAILY. 90 tablet 0   triamcinolone cream (KENALOG) 0.1 % APPLY 1 APPLICATION TOPICALLY 2 (TWO) TIMES DAILY. 80 g 0   valsartan (DIOVAN) 40 MG tablet Take 1 tablet (40 mg total) by mouth daily. (Patient not taking: Reported on 11/24/2022) 90 tablet 1   No current facility-administered medications on file prior to visit.    No Known Allergies  Blood pressure (!) 140/70, pulse 71, last menstrual period 03/13/2006, SpO2 97 %.   Assessment/Plan:  1. Hypertension -   Essential hypertension Assessment: Blood pressure above goal of <130/80 in clinic No home readings available Recheck K today- patient reports not taking valsartan Congratulated patient on quitting smoking Patient not checking blood pressure at home Exercising twice a week  Plan: Patient given BP cuff from pt care fund Start checking BP 1-2 times per day Follow up with labs  Follow up in clinic in 2 weeks Patient also has apt with PCP pharmacist. I advised  pt that I would contact Lurena Joiner about canceling that apt.      Thank you  Ramond Dial, Pharm.D, BCPS, CPP [facility] of [facility] [address] [place], [place] [address]  Phone: [phone]; Fax: [phone]

## 2022-11-25 ENCOUNTER — Telehealth: Payer: Self-pay | Admitting: Pharmacist

## 2022-11-25 LAB — BASIC METABOLIC PANEL
BUN/Creatinine Ratio: 11 — ABNORMAL LOW (ref 12–28)
BUN: 10 mg/dL (ref 8–27)
CO2: 22 mmol/L (ref 20–29)
Calcium: 10 mg/dL (ref 8.7–10.3)
Chloride: 108 mmol/L — ABNORMAL HIGH (ref 96–106)
Creatinine, Ser: 0.95 mg/dL (ref 0.57–1.00)
Glucose: 113 mg/dL — ABNORMAL HIGH (ref 70–99)
Potassium: 5.4 mmol/L — ABNORMAL HIGH (ref 3.5–5.2)
Sodium: 147 mmol/L — ABNORMAL HIGH (ref 134–144)
eGFR: 66 mL/min/{1.73_m2} (ref >59)

## 2022-11-25 MED ORDER — HYDRALAZINE HCL 50 MG PO TABS: 50.0000 mg | ORAL_TABLET | Freq: Three times a day (TID) | ORAL | 3 refills | Status: DC

## 2022-11-25 NOTE — Telephone Encounter (Signed)
Left detailed message per DPR for pt to stop all K supplements, decrease spironolactone to 12.58m daily and increase hydralazine to 571mTID. I requested patient to call back and confirm she got this message.

## 2022-11-25 NOTE — Telephone Encounter (Signed)
Opened in error

## 2022-11-27 ENCOUNTER — Other Ambulatory Visit (HOSPITAL_COMMUNITY): Payer: Self-pay

## 2022-12-02 ENCOUNTER — Ambulatory Visit: Payer: Medicare Other | Admitting: Pharmacist

## 2022-12-03 NOTE — Telephone Encounter (Signed)
Patient is returning call. She states she just wanted to let Melissa know that she received her message and if there is any additional information she needs to discuss to please call her.

## 2022-12-10 ENCOUNTER — Ambulatory Visit: Payer: Medicare Other

## 2022-12-11 ENCOUNTER — Other Ambulatory Visit: Payer: Self-pay | Admitting: Family Medicine

## 2022-12-11 DIAGNOSIS — M791 Myalgia, unspecified site: Secondary | ICD-10-CM

## 2022-12-11 NOTE — Telephone Encounter (Signed)
Requested medication (s) are due for refill today - yes  Requested medication (s) are on the active medication list -yes  Future visit scheduled -yes  Last refill: 09/29/22 #30 1RF  Notes to clinic: non delegated Rx  Requested Prescriptions  Pending Prescriptions Disp Refills   tiZANidine (ZANAFLEX) 4 MG tablet [Pharmacy Med Name: TIZANIDINE '4MG'$  TABLETS] 30 tablet 1    Sig: TAKE 1 TABLET(4 MG) BY MOUTH AT BEDTIME AS NEEDED FOR MUSCLE SPASMS     Not Delegated - Cardiovascular:  Alpha-2 Agonists - tizanidine Failed - 12/11/2022  7:02 AM      Failed - This refill cannot be delegated      Passed - Valid encounter within last 6 months    Recent Outpatient Visits           2 months ago [place], Charlane Ferretti, MD   9 months ago Moderate protein-calorie malnutrition [facility])   [place] Charlott Rakes, MD   1 year ago Screening for diabetes mellitus   [place] Charlott Rakes, MD   1 year ago Weight loss, unintentional   [place], MD   2 years ago [place] Jodi Marble, MD       Future Appointments             In 1 month  [place] at [facility], [place]   In 2 months Harrington Challenger, Carmin Muskrat, [place] at [address] [place]   In 3 months Charlott Rakes, [place]               Requested Prescriptions  Pending Prescriptions Disp Refills   tiZANidine (ZANAFLEX) 4 MG tablet [Pharmacy Med Name: TIZANIDINE '4MG'$  TABLETS] 30 tablet 1    Sig: TAKE 1 TABLET(4 MG) BY MOUTH AT BEDTIME AS NEEDED FOR MUSCLE SPASMS     Not Delegated - Cardiovascular:  Alpha-2 Agonists - tizanidine Failed - 12/11/2022  7:02 AM      Failed - This refill cannot be delegated      Passed - Valid  encounter within last 6 months    Recent Outpatient Visits           2 months ago [place], [place], MD   9 months ago Moderate protein-calorie malnutrition [facility])   [place] Charlott Rakes, MD   1 year ago Screening for diabetes mellitus   [place], Enobong, MD   1 year ago Weight loss, unintentional   [place], MD   2 years ago [place] Jodi Marble, MD       Future Appointments             In 1 month  [place] at [facility], [place]   In 2 months Harrington Challenger, Carmin Muskrat, MD [place] at [address] [place]   In 3 months Charlott Rakes, [place]

## 2022-12-31 ENCOUNTER — Other Ambulatory Visit: Payer: Self-pay | Admitting: Family Medicine

## 2022-12-31 DIAGNOSIS — Z1231 Encounter for screening mammogram for malignant neoplasm of breast: Secondary | ICD-10-CM

## 2023-01-05 ENCOUNTER — Ambulatory Visit
Admission: RE | Admit: 2023-01-05 | Discharge: 2023-01-05 | Disposition: A | Discharge status: Home / Self Care | Payer: Medicare Other | Source: Ambulatory Visit | Attending: Family Medicine | Admitting: Family Medicine

## 2023-01-05 DIAGNOSIS — Z1231 Encounter for screening mammogram for malignant neoplasm of breast: Secondary | ICD-10-CM | POA: Diagnosis not present

## 2023-01-13 ENCOUNTER — Ambulatory Visit: Payer: Medicare Other | Attending: Cardiovascular Disease | Admitting: Pharmacist

## 2023-01-13 VITALS — BP 134/80 | HR 74

## 2023-01-13 DIAGNOSIS — I10 Essential (primary) hypertension: Secondary | ICD-10-CM

## 2023-01-13 NOTE — Assessment & Plan Note (Signed)
Assessment: Blood pressure is above goal in clinic today Unclear what her home blood pressures are.  At first she tells me there is good then tells me this morning it was [id number] systolic Patient taking naproxen I have asked her to stop this and replace it with Tylenol Patient's last BMP showed elevated potassium adjustments have been made to her medications including stopping her potassium supplements and decreasing spironolactone.  We need to recheck this today before making any further changes.  She reports she has not been taking valsartan. Plan: Check BMP today We will make medication changes after BMP results tomorrow Follow-up in 3 weeks

## 2023-01-13 NOTE — Progress Notes (Unsigned)
Patient ID: [name]                 DOB: 26-Apr-1956                      MRN: [id number]      HPI: Tammy Schwartz is a 67 y.o. female referred by Dr. Harrington Challenger to HTN clinic. PMH is significant for  tobacco abuse, COPD and CAD (seen as calcifications onf CT scan, extensive in LM, LAD), PUD and HTN.   Patient seen by Dr. Harrington Challenger 10/15/2022.  Blood pressure was significantly elevated and hydralazine 25 mg 3 times per day was started.  BMP showed significantly low potassium at 2.7.  She was started on potassium supplement 20 mEq twice daily and spironolactone 25 mg daily.  Repeat potassium was still 2.7.  Potassium was increased to 40 mill equivalents twice daily.  And magnesium was added.  Repeat potassium was 5.4.  At last appointment, patient was given a BP cuff and asked to check her blood pressure at home. She reported not taking valsartan. Repeat K was 5.4. She was asked to stop taking all potassium supplements and decrease spironolactone to 12.5mg  daily. Hydralazine was increased to 50mg  TID.   Patient reports hypertension clinic today.  She did not bring any list of blood pressure readings with her.  States this morning her blood pressure was 150.  Still doing her exercise videos 2 times a week.  She is smoking very little, states that she may like 1 or 2 cigarettes and take 1 or 2 puffs sometimes she does not even touch it she just lets it it burnout.  She is very happy with her success.  Some days she does not smoke at all.  She denies any dizziness lightheadedness headaches or blurred vision.  She did fall getting out of her car recently.  This sounds like a mechanical fall.  She has been taking naproxen for the pain.  I have advised her to try Tylenol and then as naproxen can increase her blood pressure.   Current HTN meds:  spironolactone 12.5 mg daily, amlodipine 10 mg daily, carvedilol 3.125 mg twice a day, hydralazine 50 mg 3 times a day (breakfast, lunch,  dinner) Previously tried: HCTZ BP goal: <130/80  Family History:  The patient's family history includes CAD in her father; Cancer - Other in her mother; Hypertension in her sister; Stomach cancer in her mother.    Social History: The patient  reports that she has been smoking cigarettes. She has a 5.00 pack-year smoking history. She has never used smokeless tobacco. She reports that she does not drink alcohol and does not use drugs.    Diet: coffee (1 cup) soda 1 20 oz per day (coke or sunkissed) Chicken, beans, fish, ribs, pork chops Peas, green beans, broccoli  Exercise: exercise class on her phone twice per week Home exercise routine includes exercise video.  Home BP readings: none  Wt Readings from Last 3 Encounters:  10/15/22 83 lb (37.6 kg)  09/29/22 82 lb 3.2 oz (37.3 kg)  03/14/22 87 lb (39.5 kg)   BP Readings from Last 3 Encounters:  01/13/23 134/80  11/24/22 (!) 140/70  10/15/22 (!) 194/92   Pulse Readings from Last 3 Encounters:  01/13/23 74  11/24/22 71  10/15/22 (!) 57    Renal function: CrCl cannot be calculated (Unknown ideal weight.).  Past Medical History:  Diagnosis Date   Chronic back pain  HNp,spondylosis,radiculopathy   COPD (chronic obstructive pulmonary disease) (HCC)    GERD (gastroesophageal reflux disease)    Hyperlipidemia    was on choloesterol meds last yr-samples given in office but not needed since   Hypertension    takes Exforge daily   Joint pain    Shortness of breath    takes Singulair daily;with exertion   Sickle cell trait (HCC)    Spondylolysis    Tobacco use     Current Outpatient Medications on File Prior to Visit  Medication Sig Dispense Refill   albuterol (VENTOLIN HFA) 108 (90 Base) MCG/ACT inhaler INHALE 2 PUFFS INTO THE LUNGS EVERY 6 HOURS AS NEEDED 36 g 2   amLODipine (NORVASC) 10 MG tablet TAKE 1 TABLET(10 MG) BY MOUTH DAILY 90 tablet 1   aspirin EC 81 MG tablet Take 1 tablet (81 mg total) by mouth daily.  Swallow whole. 100 tablet 3   azelastine (ASTELIN) 0.1 % nasal spray Place 2 sprays into both nostrils 2 (two) times daily. 30 mL 0   buPROPion (WELLBUTRIN XL) 150 MG 24 hr tablet Take 1 tablet (150 mg total) by mouth daily. 90 tablet 1   carvedilol (COREG) 3.125 MG tablet TAKE 1 TABLET (3.125 MG TOTAL) BY MOUTH 2 (TWO) TIMES DAILY WITH A MEAL. 180 tablet 1   hydrALAZINE (APRESOLINE) 50 MG tablet Take 1 tablet (50 mg total) by mouth 3 (three) times daily. 270 tablet 3   Magnesium Glycinate 100 MG CAPS Take 200 mg by mouth daily. 60 capsule 11   mirtazapine (REMERON) 30 MG tablet TAKE 1 TABLET(30 MG) BY MOUTH AT BEDTIME 90 tablet 1   Multiple Vitamin (MULTIVITAMIN WITH MINERALS) TABS tablet Take 1 tablet by mouth daily. 30 tablet 3   naproxen (NAPROSYN) 375 MG tablet Take 1 tablet (375 mg total) by mouth 2 (two) times daily. 10 tablet 0   pantoprazole (PROTONIX) 40 MG tablet TAKE 1 TABLET(40 MG) BY MOUTH DAILY. 90 tablet 0   rosuvastatin (CRESTOR) 10 MG tablet Take 1 tablet (10 mg total) by mouth daily. 90 tablet 1   spironolactone (ALDACTONE) 25 MG tablet Take 0.5 tablets (12.5 mg total) by mouth daily. 90 tablet 3   tiZANidine (ZANAFLEX) 4 MG tablet TAKE 1 TABLET(4 MG) BY MOUTH AT BEDTIME AS NEEDED FOR MUSCLE SPASMS 30 tablet 1   cetirizine (ZYRTEC) 10 MG tablet TAKE 1 TABLET BY MOUTH DAILY. 90 tablet 1   meclizine (ANTIVERT) 25 MG tablet Take 1 tablet (25 mg total) by mouth 3 (three) times daily as needed for dizziness. 60 tablet 1   montelukast (SINGULAIR) 10 MG tablet Take 10 mg by mouth daily. (Patient not taking: Reported on 11/24/2022)     polyethylene glycol powder (GLYCOLAX/MIRALAX) powder Take 17 g by mouth daily. (Patient not taking: Reported on 01/13/2023) 3350 g 1   triamcinolone cream (KENALOG) 0.1 % APPLY 1 APPLICATION TOPICALLY 2 (TWO) TIMES DAILY. 80 g 0   No current facility-administered medications on file prior to visit.    No Known Allergies  Blood pressure 134/80, pulse 74,  last menstrual period 03/13/2006, SpO2 96 %.   Assessment/Plan:  1. Hypertension -  Essential hypertension Assessment: Blood pressure is above goal in clinic today Unclear what her home blood pressures are.  At first she tells me there is good then tells me this morning it was [id number] systolic Patient taking naproxen I have asked her to stop this and replace it with Tylenol Patient's last BMP showed elevated potassium adjustments have  been made to her medications including stopping her potassium supplements and decreasing spironolactone.  We need to recheck this today before making any further changes.  She reports she has not been taking valsartan. Plan: Check BMP today We will make medication changes after BMP results tomorrow Follow-up in 3 weeks   Thank you  Ramond Dial, Pharm.D, BCPS, CPP [facility] [place] [address] [place], [place] [address]  Phone: [phone]; Fax: [phone]

## 2023-01-13 NOTE — Patient Instructions (Signed)
Summary of today's discussion  1.Continue spironolactone 12.5 mg daily, amlodipine 10 mg daily, carvedilol 3.125 mg twice a day, hydralazine 50 mg 3 times a day  2.I will call you tomorrow with your lab work and any medication changes  3.Please bring your blood pressure readings to your next visit  4.  5.   Your blood pressure goal is <130/80  To check your pressure at home you will need to:  1. Sit up in a chair, with feet flat on the floor and back supported. Do not cross your ankles or legs. 2. Rest your left arm so that the cuff is about heart level. If the cuff goes on your upper arm,  then just relax the arm on the table, arm of the chair or your lap. If you have a wrist cuff, we  suggest relaxing your wrist against your chest (think of it as Pledging the Flag with the  wrong arm).  3. Place the cuff snugly around your arm, about 1 inch above the crook of your elbow. The  cords should be inside the groove of your elbow.  4. Sit quietly, with the cuff in place, for about 5 minutes. After that 5 minutes press the power  button to start a reading. 5. Do not talk or move while the reading is taking place.  6. Record your readings on a sheet of paper. Although most cuffs have a memory, it is often  easier to see a pattern developing when the numbers are all in front of you.  7. You can repeat the reading after 1-3 minutes if it is recommended  Make sure your bladder is empty and you have not had caffeine or tobacco within the last 30 min  Always bring your blood pressure log with you to your appointments. If you have not brought your monitor in to be double checked for accuracy, please bring it to your next appointment.  You can find a list of validated (accurate) blood pressure cuffs at [employer].it   Important lifestyle changes to control high blood pressure  Intervention  Effect on the BP  Lose extra pounds and watch your waistline Weight loss is one of the most effective  lifestyle changes for controlling blood pressure. If you're overweight or obese, losing even a small amount of weight can help reduce blood pressure. Blood pressure might go down by about 1 millimeter of mercury (mm Hg) with each kilogram (about 2.2 pounds) of weight lost.  Exercise regularly As a general goal, aim for at least 30 minutes of moderate physical activity every day. Regular physical activity can lower high blood pressure by about 5 to 8 mm Hg.  Eat a healthy diet Eating a diet rich in whole grains, fruits, vegetables, and low-fat dairy products and low in saturated fat and cholesterol. A healthy diet can lower high blood pressure by up to 11 mm Hg.  Reduce salt (sodium) in your diet Even a small reduction of sodium in the diet can improve heart health and reduce high blood pressure by about 5 to 6 mm Hg.  Limit alcohol One drink equals 12 ounces of beer, 5 ounces of wine, or 1.5 ounces of 80-proof liquor.  Limiting alcohol to less than one drink a day for women or two drinks a day for men can help lower blood pressure by about 4 mm Hg.   Please call me at [phone] with any questions.

## 2023-01-14 LAB — BASIC METABOLIC PANEL
BUN/Creatinine Ratio: 11 — ABNORMAL LOW (ref 12–28)
BUN: 9 mg/dL (ref 8–27)
CO2: 24 mmol/L (ref 20–29)
Calcium: 9.2 mg/dL (ref 8.7–10.3)
Chloride: 109 mmol/L — ABNORMAL HIGH (ref 96–106)
Creatinine, Ser: 0.85 mg/dL (ref 0.57–1.00)
Glucose: 81 mg/dL (ref 70–99)
Potassium: 4.3 mmol/L (ref 3.5–5.2)
Sodium: 145 mmol/L — ABNORMAL HIGH (ref 134–144)
eGFR: 75 mL/min/{1.73_m2} (ref >59)

## 2023-01-14 MED ORDER — HYDRALAZINE HCL 50 MG PO TABS: 75.0000 mg | ORAL_TABLET | Freq: Three times a day (TID) | ORAL | 3 refills | Status: DC

## 2023-01-15 ENCOUNTER — Telehealth: Payer: Self-pay | Admitting: Pharmacist

## 2023-01-15 NOTE — Telephone Encounter (Signed)
Left VM per DPR for pt to increase her hydralazine to 75mg  TID.

## 2023-01-17 ENCOUNTER — Other Ambulatory Visit: Payer: Self-pay | Admitting: Family Medicine

## 2023-01-19 NOTE — Telephone Encounter (Signed)
Pt is calling to check on her prescription. Per pt she is out of medication. Advised pt that the pharmacy did request the medication on Saturday 01/17/23. Pt is wanting to see if PCP could please send in the medication today since she is out. Please advise.

## 2023-02-04 ENCOUNTER — Other Ambulatory Visit: Payer: Self-pay | Admitting: Family Medicine

## 2023-02-04 DIAGNOSIS — J449 Chronic obstructive pulmonary disease, unspecified: Secondary | ICD-10-CM

## 2023-02-05 ENCOUNTER — Telehealth: Payer: Self-pay | Admitting: Family Medicine

## 2023-02-05 NOTE — Telephone Encounter (Signed)
Contacted Tammy Schwartz to schedule their annual wellness visit. Appointment made for 02/12/23.  Rudell Cobb AWV direct phone # [phone]

## 2023-02-05 NOTE — Telephone Encounter (Signed)
Requested Prescriptions  Pending Prescriptions Disp Refills   albuterol (VENTOLIN HFA) 108 (90 Base) MCG/ACT inhaler [Pharmacy Med Name: ALBUTEROL HFA INH (200 PUFFS) 8.5GM] 17 g 2    Sig: INHALE 2 PUFFS INTO THE LUNGS EVERY 6 HOURS AS NEEDED     Pulmonology:  Beta Agonists 2 Passed - 02/04/2023  7:18 PM      Passed - Last BP in normal range    BP Readings from Last 1 Encounters:  01/13/23 134/80         Passed - Last Heart Rate in normal range    Pulse Readings from Last 1 Encounters:  01/13/23 74         Passed - Valid encounter within last 12 months    Recent Outpatient Visits           4 months ago Myalgia   [place] [facility] [place], Odette Horns, MD   10 months ago Moderate protein-calorie malnutrition [facility])   [place] [facility] Hoy Register, MD   1 year ago Screening for diabetes mellitus   [place] [facility] Hoy Register, MD   1 year ago Weight loss, unintentional   [facility] Hoy Register, MD   2 years ago [employer]   [place] [facility] Flo Shanks, MD       Future Appointments             Tomorrow  [facility]   In 2 weeks Tenny Craw, Sherol Dade, MD [facility] [address], [facility]   In 2 months Hoy Register, MD [facility]

## 2023-02-06 ENCOUNTER — Ambulatory Visit: Payer: Medicare Other | Attending: Cardiology | Admitting: Pharmacist

## 2023-02-06 VITALS — BP 180/82 | HR 79

## 2023-02-06 DIAGNOSIS — I10 Essential (primary) hypertension: Secondary | ICD-10-CM | POA: Diagnosis not present

## 2023-02-06 NOTE — Progress Notes (Signed)
Patient ID: Tammy Schwartz                 DOB: 1956-07-12                      MRN: [id number]      HPI: Clark Clowdus is a 67 y.o. female referred by Dr. Tenny Craw to HTN clinic. PMH is significant for  tobacco abuse, COPD and CAD (seen as calcifications onf CT scan, extensive in LM, LAD), PUD and HTN.   Patient seen by Dr. Tenny Craw 10/15/2022.  Blood pressure was significantly elevated and hydralazine 25 mg 3 times per day was started.  BMP showed significantly low potassium at 2.7.  She was started on potassium supplement 20 mEq twice daily and spironolactone 25 mg daily.  Repeat potassium was still 2.7.  Potassium was increased to 40 mill equivalents twice daily.  And magnesium was added.  Repeat potassium was 5.4.  At previous appointment, patient was given a BP cuff and asked to check her blood pressure at home. She reported not taking valsartan. Repeat K was 5.4. She was asked to stop taking all potassium supplements and decrease spironolactone to 12.5mg  daily. Hydralazine was increased to 50mg  TID.   At last appointment on 01/13/2023 patient's blood pressure was 134/80.  She did not bring a list of home blood pressures, stated this morning her blood pressure was 150.  She was taking naproxen for pain and was asked to switch to Tylenol.  Hydralazine was increased to 75 mg 3 times a day.  Repeat labs showed improvement in potassium down to 4.3, sodium better at 145.  Patient presents today to HTN clinic. She reports a headache when she woke up this AM. Took some naproxen and lied down and feels a little better. Pressure is up in clinic today 180/82. Home readings vary from 130's-150's/60-80's. Reports being under more stress. Has not smoked in a while. Lost the taste for it.  Current HTN meds:  spironolactone 12.5 mg daily, amlodipine 10 mg daily, carvedilol 3.125 mg twice a day, hydralazine 75 mg 3 times a day (10 AM breakfast, 3PM lunch, dinner 8PM)  Previously tried: HCTZ BP  goal: <130/80  Family History:  The patient's family history includes CAD in her father; Cancer - Other in her mother; Hypertension in her sister; Stomach cancer in her mother.    Social History: The patient  reports that she has been smoking cigarettes. She has a 5.00 pack-year smoking history. She has never used smokeless tobacco. She reports that she does not drink alcohol and does not use drugs.    Diet: coffee (1 cup) soda 1 20 oz per day (coke or sunkissed) Chicken, beans, fish, ribs, pork chops Peas, green beans, broccoli  Exercise: exercise class on her phone twice per week Home exercise routine includes exercise video.  Home BP readings:  145 83  134 72  138 73  163 72  156 81  142 75  134 60  144 75  134 71  166 84  148 82  158 78  145  78  134 84  130 69  145 68  160 80  138 73  145 73  142 74  130 69  132 70  143.7727 74.72727    Wt Readings from Last 3 Encounters:  10/15/22 83 lb (37.6 kg)  09/29/22 82 lb 3.2 oz (37.3 kg)  03/14/22 87 lb (39.5 kg)   BP Readings from Last 3  Encounters:  02/06/23 (!) 180/82  01/13/23 134/80  11/24/22 (!) 140/70   Pulse Readings from Last 3 Encounters:  02/06/23 79  01/13/23 74  11/24/22 71    Renal function: CrCl cannot be calculated (Patient's most recent lab result is older than the maximum 21 days allowed.).  Past Medical History:  Diagnosis Date   Chronic back pain    HNp,spondylosis,radiculopathy   COPD (chronic obstructive pulmonary disease) (HCC)    GERD (gastroesophageal reflux disease)    Hyperlipidemia    was on choloesterol meds last yr-samples given in office but not needed since   Hypertension    takes Exforge daily   Joint pain    Shortness of breath    takes Singulair daily;with exertion   Sickle cell trait (HCC)    Spondylolysis    Tobacco use     Current Outpatient Medications on File Prior to Visit  Medication Sig Dispense Refill   valsartan (DIOVAN) 40 MG tablet Take 2 tablets  (80 mg total) by mouth daily. 60 tablet 0   albuterol (VENTOLIN HFA) 108 (90 Base) MCG/ACT inhaler INHALE 2 PUFFS INTO THE LUNGS EVERY 6 HOURS AS NEEDED 17 g 2   amLODipine (NORVASC) 10 MG tablet TAKE 1 TABLET(10 MG) BY MOUTH DAILY 90 tablet 1   aspirin EC 81 MG tablet Take 1 tablet (81 mg total) by mouth daily. Swallow whole. 100 tablet 3   azelastine (ASTELIN) 0.1 % nasal spray Place 2 sprays into both nostrils 2 (two) times daily. 30 mL 0   buPROPion (WELLBUTRIN XL) 150 MG 24 hr tablet Take 1 tablet (150 mg total) by mouth daily. 90 tablet 1   carvedilol (COREG) 3.125 MG tablet TAKE 1 TABLET (3.125 MG TOTAL) BY MOUTH 2 (TWO) TIMES DAILY WITH A MEAL. 180 tablet 1   cetirizine (ZYRTEC) 10 MG tablet TAKE 1 TABLET BY MOUTH DAILY. 90 tablet 1   hydrALAZINE (APRESOLINE) 50 MG tablet Take 1.5 tablets (75 mg total) by mouth 3 (three) times daily. 405 tablet 3   Magnesium Glycinate 100 MG CAPS Take 200 mg by mouth daily. 60 capsule 11   meclizine (ANTIVERT) 25 MG tablet Take 1 tablet (25 mg total) by mouth 3 (three) times daily as needed for dizziness. 60 tablet 1   mirtazapine (REMERON) 30 MG tablet TAKE 1 TABLET(30 MG) BY MOUTH AT BEDTIME 90 tablet 1   montelukast (SINGULAIR) 10 MG tablet Take 10 mg by mouth daily. (Patient not taking: Reported on 11/24/2022)     Multiple Vitamin (MULTIVITAMIN WITH MINERALS) TABS tablet Take 1 tablet by mouth daily. 30 tablet 3   naproxen (NAPROSYN) 375 MG tablet Take 1 tablet (375 mg total) by mouth 2 (two) times daily. 10 tablet 0   pantoprazole (PROTONIX) 40 MG tablet TAKE 1 TABLET(40 MG) BY MOUTH DAILY 90 tablet 0   polyethylene glycol powder (GLYCOLAX/MIRALAX) powder Take 17 g by mouth daily. (Patient not taking: Reported on 01/13/2023) 3350 g 1   rosuvastatin (CRESTOR) 10 MG tablet Take 1 tablet (10 mg total) by mouth daily. 90 tablet 1   tiZANidine (ZANAFLEX) 4 MG tablet TAKE 1 TABLET(4 MG) BY MOUTH AT BEDTIME AS NEEDED FOR MUSCLE SPASMS 30 tablet 1    triamcinolone cream (KENALOG) 0.1 % APPLY 1 APPLICATION TOPICALLY 2 (TWO) TIMES DAILY. 80 g 0   No current facility-administered medications on file prior to visit.    No Known Allergies  Blood pressure (!) 180/82, pulse 79, last menstrual period 03/13/2006.   Assessment/Plan:  1. Hypertension -  Essential hypertension Assessment: Blood pressure significantly elevated in clinic today. Patient reports taking all her medications this AM. Has headache. Reports eating salted food last night Stopped smoking Under a good deal of stress Home readings vary- average is 143/74 Previously had hypokalemia and then hyperkalemia  Plan: Stop spironolactone 12.5mg  daily START valsartan 80mg  daily. She has 40mg  tablets at home from previous. I have asked her to take 2 tablets when she gets home. Continue amlodipine 10 mg daily, carvedilol 3.125 mg twice a day, hydralazine 75 mg 3 times a day (10 AM breakfast, 3PM lunch, bedtime 1AM)  I have asked her to take her hydralazine at breakfast, lunch and bedtime. Take 1/2 tablet of hydralazine when she gets home. Follow up with Dr. Tenny Craw May 14 and me May 28    Thank you  Olene Floss, Pharm.D, BCPS, CPP [facility] [address] [place], [place] [address]  Phone: [phone]; Fax: [phone]

## 2023-02-06 NOTE — Patient Instructions (Addendum)
Summary of today's discussion  1.STOP spironolactone  2. START valsartan 80mg  (2 tablets) daily TODAY. Also take 1/2 tablet of hydralazine when you get home.  3. Continue hydralazine 75mg  three times a day (breakfast, lunch and bedtime) amlodipine 10mg  daily and carvedilol 3.125mg  twice a day  4. Labs at appointment with Dr. Tenny Craw 5/14  5. Continue to check blood pressure at home   Your blood pressure goal is <130/80  To check your pressure at home you will need to:  1. Sit up in a chair, with feet flat on the floor and back supported. Do not cross your ankles or legs. 2. Rest your left arm so that the cuff is about heart level. If the cuff goes on your upper arm,  then just relax the arm on the table, arm of the chair or your lap. If you have a wrist cuff, we  suggest relaxing your wrist against your chest (think of it as Pledging the Flag with the  wrong arm).  3. Place the cuff snugly around your arm, about 1 inch above the crook of your elbow. The  cords should be inside the groove of your elbow.  4. Sit quietly, with the cuff in place, for about 5 minutes. After that 5 minutes press the power  button to start a reading. 5. Do not talk or move while the reading is taking place.  6. Record your readings on a sheet of paper. Although most cuffs have a memory, it is often  easier to see a pattern developing when the numbers are all in front of you.  7. You can repeat the reading after 1-3 minutes if it is recommended  Make sure your bladder is empty and you have not had caffeine or tobacco within the last 30 min  Always bring your blood pressure log with you to your appointments. If you have not brought your monitor in to be double checked for accuracy, please bring it to your next appointment.  You can find a list of validated (accurate) blood pressure cuffs at [employer].no   Important lifestyle changes to control high blood pressure  Intervention  Effect on the BP  Lose extra  pounds and watch your waistline Weight loss is one of the most effective lifestyle changes for controlling blood pressure. If you're overweight or obese, losing even a small amount of weight can help reduce blood pressure. Blood pressure might go down by about 1 millimeter of mercury (mm Hg) with each kilogram (about 2.2 pounds) of weight lost.  Exercise regularly As a general goal, aim for at least 30 minutes of moderate physical activity every day. Regular physical activity can lower high blood pressure by about 5 to 8 mm Hg.  Eat a healthy diet Eating a diet rich in whole grains, fruits, vegetables, and low-fat dairy products and low in saturated fat and cholesterol. A healthy diet can lower high blood pressure by up to 11 mm Hg.  Reduce salt (sodium) in your diet Even a small reduction of sodium in the diet can improve heart health and reduce high blood pressure by about 5 to 6 mm Hg.  Limit alcohol One drink equals 12 ounces of beer, 5 ounces of wine, or 1.5 ounces of 80-proof liquor.  Limiting alcohol to less than one drink a day for women or two drinks a day for men can help lower blood pressure by about 4 mm Hg.   Please call me at [phone] with any questions.

## 2023-02-06 NOTE — Assessment & Plan Note (Signed)
Assessment: Blood pressure significantly elevated in clinic today. Patient reports taking all her medications this AM. Has headache. Reports eating salted food last night Stopped smoking Under a good deal of stress Home readings vary- average is 143/74 Previously had hypokalemia and then hyperkalemia  Plan: Stop spironolactone 12.5mg  daily START valsartan 80mg  daily. She has 40mg  tablets at home from previous. I have asked her to take 2 tablets when she gets home. Continue amlodipine 10 mg daily, carvedilol 3.125 mg twice a day, hydralazine 75 mg 3 times a day (10 AM breakfast, 3PM lunch, bedtime 1AM)  I have asked her to take her hydralazine at breakfast, lunch and bedtime. Take 1/2 tablet of hydralazine when she gets home. Follow up with Dr. Tenny Craw May 14 and me May 28

## 2023-02-12 ENCOUNTER — Ambulatory Visit: Payer: Medicare Other | Attending: Family Medicine

## 2023-02-12 VITALS — Ht 61.0 in | Wt 90.0 lb

## 2023-02-12 DIAGNOSIS — Z0000 Encounter for general adult medical examination without abnormal findings: Secondary | ICD-10-CM | POA: Diagnosis not present

## 2023-02-12 NOTE — Progress Notes (Signed)
I connected with  Tammy Schwartz on 02/12/23 by a audio enabled telemedicine application and verified that I am speaking with the correct person using two identifiers.  Patient Location: Home  Provider Location: Office/Clinic  I discussed the limitations of evaluation and management by telemedicine. The patient expressed understanding and agreed to proceed.  Subjective:   Tammy Schwartz is a 67 y.o. female who presents for Medicare Annual (Subsequent) preventive examination.  Review of Systems     Cardiac Risk Factors include: advanced age (>42men, >22 women);dyslipidemia;hypertension     Objective:    Today's Vitals   02/12/23 1227  Weight: 90 lb (40.8 kg)  Height: 5\' 1"  (1.549 m)   Body mass index is 17.01 kg/m.     02/12/2023   12:33 PM 04/23/2021    2:56 PM 04/24/2020    1:57 PM 02/23/2020    5:37 PM 08/01/2019    2:32 PM 07/14/2018    2:30 PM 07/07/2018   12:17 AM  Advanced Directives  Does Patient Have a Medical Advance Directive? No No No No No No No  Would patient like information on creating a medical advance directive?  No - Patient declined No - Patient declined  Yes (MAU/Ambulatory/Procedural Areas - Information given) No - Patient declined No - Patient declined    Current Medications (verified) Outpatient Encounter Medications as of 02/12/2023  Medication Sig   albuterol (VENTOLIN HFA) 108 (90 Base) MCG/ACT inhaler INHALE 2 PUFFS INTO THE LUNGS EVERY 6 HOURS AS NEEDED   aspirin EC 81 MG tablet Take 1 tablet (81 mg total) by mouth daily. Swallow whole.   azelastine (ASTELIN) 0.1 % nasal spray Place 2 sprays into both nostrils 2 (two) times daily.   buPROPion (WELLBUTRIN XL) 150 MG 24 hr tablet Take 1 tablet (150 mg total) by mouth daily.   carvedilol (COREG) 3.125 MG tablet TAKE 1 TABLET (3.125 MG TOTAL) BY MOUTH 2 (TWO) TIMES DAILY WITH A MEAL.   hydrALAZINE (APRESOLINE) 50 MG tablet Take 1.5 tablets (75 mg total) by mouth 3 (three) times  daily.   mirtazapine (REMERON) 30 MG tablet TAKE 1 TABLET(30 MG) BY MOUTH AT BEDTIME   Multiple Vitamin (MULTIVITAMIN WITH MINERALS) TABS tablet Take 1 tablet by mouth daily.   pantoprazole (PROTONIX) 40 MG tablet TAKE 1 TABLET(40 MG) BY MOUTH DAILY   polyethylene glycol powder (GLYCOLAX/MIRALAX) powder Take 17 g by mouth daily.   tiZANidine (ZANAFLEX) 4 MG tablet TAKE 1 TABLET(4 MG) BY MOUTH AT BEDTIME AS NEEDED FOR MUSCLE SPASMS   valsartan (DIOVAN) 40 MG tablet Take 2 tablets (80 mg total) by mouth daily.   amLODipine (NORVASC) 10 MG tablet TAKE 1 TABLET(10 MG) BY MOUTH DAILY (Patient not taking: Reported on 02/12/2023)   cetirizine (ZYRTEC) 10 MG tablet TAKE 1 TABLET BY MOUTH DAILY.   Magnesium Glycinate 100 MG CAPS Take 200 mg by mouth daily. (Patient not taking: Reported on 02/12/2023)   meclizine (ANTIVERT) 25 MG tablet Take 1 tablet (25 mg total) by mouth 3 (three) times daily as needed for dizziness. (Patient not taking: Reported on 02/12/2023)   montelukast (SINGULAIR) 10 MG tablet Take 10 mg by mouth daily. (Patient not taking: Reported on 11/24/2022)   naproxen (NAPROSYN) 375 MG tablet Take 1 tablet (375 mg total) by mouth 2 (two) times daily. (Patient not taking: Reported on 02/12/2023)   rosuvastatin (CRESTOR) 10 MG tablet Take 1 tablet (10 mg total) by mouth daily. (Patient not taking: Reported on 02/12/2023)   triamcinolone cream (  KENALOG) 0.1 % APPLY 1 APPLICATION TOPICALLY 2 (TWO) TIMES DAILY. (Patient not taking: Reported on 02/12/2023)   No facility-administered encounter medications on file as of 02/12/2023.    Allergies (verified) Patient has no known allergies.   History: Past Medical History:  Diagnosis Date   Chronic back pain    HNp,spondylosis,radiculopathy   COPD (chronic obstructive pulmonary disease) (HCC)    GERD (gastroesophageal reflux disease)    Hyperlipidemia    was on choloesterol meds last yr-samples given in office but not needed since   Hypertension    takes  Exforge daily   Joint pain    Shortness of breath    takes Singulair daily;with exertion   Sickle cell trait (HCC)    Spondylolysis    Tobacco use    Past Surgical History:  Procedure Laterality Date   BACK SURGERY     BREAST EXCISIONAL BIOPSY Left 2007   benign   BREAST SURGERY Left    lumpectomy   COLONOSCOPY     DILATION AND CURETTAGE OF UTERUS     LUMBAR LAMINECTOMY/DECOMPRESSION MICRODISCECTOMY  06/04/2012   Procedure: LUMBAR LAMINECTOMY/DECOMPRESSION MICRODISCECTOMY 1 LEVEL;  Surgeon: Carmela Hurt, MD;  Location: [facility];  Service: Neurosurgery;  Laterality: Left;  LEFT Lumbar five-sacral one diskectomy   LUMBAR LAMINECTOMY/DECOMPRESSION MICRODISCECTOMY Right 06/16/2013   Procedure: RIGHT Lumbar Five-Sacral One Microdiskectomy;  Surgeon: Carmela Hurt, MD;  Location: [facility];  Service: Neurosurgery;  Laterality: Right;  RIGHT Lumbar Five-Sacral One Microdiskectomy   LUMBAR LAMINECTOMY/DECOMPRESSION MICRODISCECTOMY Right 01/20/2014   Procedure: LUMBAR FOUR TO LUMBAR FIVE LUMBAR LAMINECTOMY/DECOMPRESSION MICRODISCECTOMY 1 LEVEL;  Surgeon: Carmela Hurt, MD;  Location: [facility];  Service: Neurosurgery;  Laterality: Right;  Right L45 laminectomy and foramintomy   Family History  Problem Relation Age of Onset   Stomach cancer Mother    Cancer - Other Mother    CAD Father        Died age 58   Hypertension Sister    Social History   Socioeconomic History   Marital status: Married    Spouse name: Not on file   Number of children: Not on file   Years of education: Not on file   Highest education level: Not on file  Occupational History   Not on file  Tobacco Use   Smoking status: Former    Packs/day: 0.25    Years: 20.00    Additional pack years: 0.00    Total pack years: 5.00    Types: Cigarettes   Smokeless tobacco: Never  Vaping Use   Vaping Use: Never used  Substance and Sexual Activity   Alcohol use: No   Drug use: No   Sexual activity: Yes     Birth control/protection: Post-menopausal  Other Topics Concern   Not on file  Social History Narrative   Lives with husband and son.    Social Determinants of Health   Financial Resource Strain: Low Risk  (02/12/2023)   Overall Financial Resource Strain (CARDIA)    Difficulty of Paying Living Expenses: Not hard at all  Food Insecurity: No Food Insecurity (02/12/2023)   Hunger Vital Sign    Worried About Running Out of Food in the Last Year: Never true    Ran Out of Food in the Last Year: Never true  Transportation Needs: No Transportation Needs (02/12/2023)   PRAPARE - Administrator, Civil Service (Medical): No    Lack of Transportation (Non-Medical): No  Physical Activity:  Insufficiently Active (02/12/2023)   Exercise Vital Sign    Days of Exercise per Week: 2 days    Minutes of Exercise per Session: 20 min  Stress: No Stress Concern Present (02/12/2023)   [employer] of Occupational Health - Occupational Stress Questionnaire    Feeling of Stress : Not at all  Social Connections: Socially Integrated (04/23/2021)   Social Connection and Isolation Panel [NHANES]    Frequency of Communication with Friends and Family: More than three times a week    Frequency of Social Gatherings with Friends and Family: More than three times a week    Attends Religious Services: More than 4 times per year    Active Member of [employer] or Organizations: Yes    Attends Engineer, structural: More than 4 times per year    Marital Status: Married    Tobacco Counseling Counseling given: Not Answered   Clinical Intake:  Pre-visit preparation completed: Yes  Pain : No/denies pain     Nutritional Status: BMI <19  Underweight Nutritional Risks: None Diabetes: No  How often do you need to have someone help you when you read instructions, pamphlets, or other written materials from your doctor or pharmacy?: 1 - Never  Diabetic? no  Interpreter Needed?: No  Information entered  by :: NAllen LPN   Activities of Daily Living    02/12/2023   12:35 PM  In your present state of health, do you have any difficulty performing the following activities:  Hearing? 0  Vision? 0  Difficulty concentrating or making decisions? 0  Walking or climbing stairs? 0  Dressing or bathing? 0  Doing errands, shopping? 0  Preparing Food and eating ? N  Using the Toilet? N  In the past six months, have you accidently leaked urine? N  Do you have problems with loss of bowel control? N  Managing your Medications? N  Managing your Finances? N  Housekeeping or managing your Housekeeping? N    Patient Care Team: Hoy Register, MD as PCP - General (Family Medicine) Rollene Rotunda, MD as PCP - Cardiology (Cardiology)  Indicate any recent Medical Services you may have received from other than [facility] providers in the past year (date may be approximate).     Assessment:   This is a routine wellness examination for Tammy Schwartz.  Hearing/Vision screen Vision Screening - Comments:: Regular eye exams, [facility]  Dietary issues and exercise activities discussed: Current Exercise Habits: Home exercise routine, Type of exercise: calisthenics, Time (Minutes): 20, Frequency (Times/Week): 2, Weekly Exercise (Minutes/Week): 40   Goals Addressed             This Visit's Progress    Patient Stated       02/12/2023, wants to gain weight       Depression Screen    02/12/2023   12:35 PM 09/29/2022    3:50 PM 03/14/2022    2:59 PM 12/02/2021   11:43 AM 04/29/2021    1:59 PM 04/23/2021    2:52 PM 05/30/2020    2:13 PM  PHQ 2/9 Scores  PHQ - 2 Score 0 0 0 0 0 1 0  PHQ- 9 Score  2 0 0 4  1    Fall Risk    02/12/2023   12:34 PM 09/29/2022    3:41 PM 03/14/2022    2:56 PM 04/23/2021    2:56 PM 12/05/2020    3:29 PM  Fall Risk   Falls in the past year? 1 1 0  0 0  Comment getting out the car, missed a step      Number falls in past yr: 1 0 0 0 0  Injury with Fall? 0 1 0 0 0  Risk for fall  due to : Medication side effect   No Fall Risks   Follow up Falls prevention discussed;Education provided;Falls evaluation completed   Education provided     FALL RISK PREVENTION PERTAINING TO THE HOME:  Any stairs in or around the home? No  If so, are there any without handrails? N/a Home free of loose throw rugs in walkways, pet beds, electrical cords, etc? Yes  Adequate lighting in your home to reduce risk of falls? Yes   ASSISTIVE DEVICES UTILIZED TO PREVENT FALLS:  Life alert? No  Use of a cane, walker or w/c?  sometimes Grab bars in the bathroom? No  Shower chair or bench in shower? No  Elevated toilet seat or a handicapped toilet? No   TIMED UP AND GO:  Was the test performed? No .      Cognitive Function:        02/12/2023   12:36 PM  6CIT Screen  What Year? 0 points  What month? 0 points  What time? 0 points  Count back from 20 0 points  Months in reverse 0 points  Repeat phrase 0 points  Total Score 0 points    Immunizations Immunization History  Administered Date(s) Administered   Fluad Quad(high Dose 65+) 09/29/2022   Influenza,inj,Quad PF,6+ Mos 07/31/2017, 07/08/2018, 06/09/2019, 05/30/2020, 08/16/2021   PFIZER Comirnaty(Gray Top)Covid-19 Tri-Sucrose Vaccine 12/04/2020   PFIZER(Purple Top)SARS-COV-2 Vaccination 01/23/2020, 02/13/2020   PNEUMOCOCCAL CONJUGATE-20 03/14/2022   Pneumococcal Conjugate-13 12/05/2020   Tdap 07/31/2017    TDAP status: Up to date  Flu Vaccine status: Up to date  Pneumococcal vaccine status: Up to date  Covid-19 vaccine status: Completed vaccines  Qualifies for Shingles Vaccine? Yes   Zostavax completed No   Shingrix Completed?: No.    Education has been provided regarding the importance of this vaccine. Patient has been advised to call insurance company to determine out of pocket expense if they have not yet received this vaccine. Advised may also receive vaccine at local pharmacy or Health Dept. Verbalized  acceptance and understanding.  Screening Tests Health Maintenance  Topic Date Due   Zoster Vaccines- Shingrix (1 of 2) Never done   Medicare Annual Wellness (AWV)  04/23/2022   COVID-19 Vaccine (5 - 2023-24 season) 06/13/2022   Lung Cancer Screening  08/22/2022   INFLUENZA VACCINE  05/14/2023   COLONOSCOPY (Pts 45-37yrs Insurance coverage will need to be confirmed)  12/26/2024   MAMMOGRAM  01/04/2025   DTaP/Tdap/Td (2 - Td or Tdap) 08/01/2027   Pneumonia Vaccine 43+ Years old  Completed   DEXA SCAN  Completed   Hepatitis C Screening  Completed   HPV VACCINES  Aged Out    Health Maintenance  Health Maintenance Due  Topic Date Due   Zoster Vaccines- Shingrix (1 of 2) Never done   Medicare Annual Wellness (AWV)  04/23/2022   COVID-19 Vaccine (5 - 2023-24 season) 06/13/2022   Lung Cancer Screening  08/22/2022    Colorectal cancer screening: Type of screening: Colonoscopy. Completed 12/27/2014. Repeat every 10 years  Mammogram status: Completed 01/05/2023. Repeat every year  Bone Density status: Completed 09/12/2020.   Lung Cancer Screening: (Low Dose CT Chest recommended if Age 50-80 years, 30 pack-year currently smoking OR have quit w/in 15years.) does qualify.   Lung  Cancer Screening Referral: ordered 09/29/2022  Additional Screening:  Hepatitis C Screening: does qualify; Completed 09/14/2021  Vision Screening: Recommended annual ophthalmology exams for early detection of glaucoma and other disorders of the eye. Is the patient up to date with their annual eye exam?  Yes  Who is the provider or what is the name of the office in which the patient attends annual eye exams? [facility] If pt is not established with a provider, would they like to be referred to a provider to establish care? No .   Dental Screening: Recommended annual dental exams for proper oral hygiene  Community Resource Referral / Chronic Care Management: CRR required this visit?  No   CCM required this  visit?  No      Plan:     I have personally reviewed and noted the following in the patient's chart:   Medical and social history Use of alcohol, tobacco or illicit drugs  Current medications and supplements including opioid prescriptions. Patient is not currently taking opioid prescriptions. Functional ability and status Nutritional status Physical activity Advanced directives List of other physicians Hospitalizations, surgeries, and ER visits in previous 12 months Vitals Screenings to include cognitive, depression, and falls Referrals and appointments  In addition, I have reviewed and discussed with patient certain preventive protocols, quality metrics, and best practice recommendations. A written personalized care plan for preventive services as well as general preventive health recommendations were provided to patient.     Barb Merino, LPN   05/14/9561   Nurse Notes: none  Due to this being a virtual visit, the after visit summary with patients personalized plan was offered to patient via mail or my-chart.  Patient would like to access on my-chart

## 2023-02-12 NOTE — Patient Instructions (Signed)
Tammy Schwartz , Thank you for taking time to come for your Medicare Wellness Visit. I appreciate your ongoing commitment to your health goals. Please review the following plan we discussed and let me know if I can assist you in the future.   These are the goals we discussed:  Goals      Have 3 meals a day     Patient Stated     02/12/2023, wants to gain weight        This is a list of the screening recommended for you and due dates:  Health Maintenance  Topic Date Due   Zoster (Shingles) Vaccine (1 of 2) Never done   COVID-19 Vaccine (5 - 2023-24 season) 06/13/2022   Screening for Lung Cancer  08/22/2022   Flu Shot  05/14/2023   Medicare Annual Wellness Visit  02/12/2024   Colon Cancer Screening  12/26/2024   Mammogram  01/04/2025   DTaP/Tdap/Td vaccine (2 - Td or Tdap) 08/01/2027   Pneumonia Vaccine  Completed   DEXA scan (bone density measurement)  Completed   Hepatitis C Screening: USPSTF Recommendation to screen - Ages 48-79 yo.  Completed   HPV Vaccine  Aged Out    Advanced directives: Advance directive discussed with you today. .  Conditions/risks identified: none  Next appointment: Follow up in one year for your annual wellness visit    Preventive Care 65 Years and Older, Female Preventive care refers to lifestyle choices and visits with your health care provider that can promote health and wellness. What does preventive care include? A yearly physical exam. This is also called an annual well check. Dental exams once or twice a year. Routine eye exams. Ask your health care provider how often you should have your eyes checked. Personal lifestyle choices, including: Daily care of your teeth and gums. Regular physical activity. Eating a healthy diet. Avoiding tobacco and drug use. Limiting alcohol use. Practicing safe sex. Taking low-dose aspirin every day. Taking vitamin and mineral supplements as recommended by your health care provider. What happens during an  annual well check? The services and screenings done by your health care provider during your annual well check will depend on your age, overall health, lifestyle risk factors, and family history of disease. Counseling  Your health care provider may ask you questions about your: Alcohol use. Tobacco use. Drug use. Emotional well-being. Home and relationship well-being. Sexual activity. Eating habits. History of falls. Memory and ability to understand (cognition). Work and work Astronomer. Reproductive health. Screening  You may have the following tests or measurements: Height, weight, and BMI. Blood pressure. Lipid and cholesterol levels. These may be checked every 5 years, or more frequently if you are over 33 years old. Skin check. Lung cancer screening. You may have this screening every year starting at age 43 if you have a 30-pack-year history of smoking and currently smoke or have quit within the past 15 years. Fecal occult blood test (FOBT) of the stool. You may have this test every year starting at age 49. Flexible sigmoidoscopy or colonoscopy. You may have a sigmoidoscopy every 5 years or a colonoscopy every 10 years starting at age 8. Hepatitis C blood test. Hepatitis B blood test. Sexually transmitted disease (STD) testing. Diabetes screening. This is done by checking your blood sugar (glucose) after you have not eaten for a while (fasting). You may have this done every 1-3 years. Bone density scan. This is done to screen for osteoporosis. You may have this done starting  at age 8. Mammogram. This may be done every 1-2 years. Talk to your health care provider about how often you should have regular mammograms. Talk with your health care provider about your test results, treatment options, and if necessary, the need for more tests. Vaccines  Your health care provider may recommend certain vaccines, such as: Influenza vaccine. This is recommended every year. Tetanus,  diphtheria, and acellular pertussis (Tdap, Td) vaccine. You may need a Td booster every 10 years. Zoster vaccine. You may need this after age 22. Pneumococcal 13-valent conjugate (PCV13) vaccine. One dose is recommended after age 23. Pneumococcal polysaccharide (PPSV23) vaccine. One dose is recommended after age 57. Talk to your health care provider about which screenings and vaccines you need and how often you need them. This information is not intended to replace advice given to you by your health care provider. Make sure you discuss any questions you have with your health care provider. Document Released: 10/26/2015 Document Revised: 06/18/2016 Document Reviewed: 07/31/2015 Elsevier Interactive Patient Education  2017 [place] Prevention in the Home Falls can cause injuries. They can happen to people of all ages. There are many things you can do to make your home safe and to help prevent falls. What can I do on the outside of my home? Regularly fix the edges of walkways and driveways and fix any cracks. Remove anything that might make you trip as you walk through a door, such as a raised step or threshold. Trim any bushes or trees on the path to your home. Use bright outdoor lighting. Clear any walking paths of anything that might make someone trip, such as rocks or tools. Regularly check to see if handrails are loose or broken. Make sure that both sides of any steps have handrails. Any raised decks and porches should have guardrails on the edges. Have any leaves, snow, or ice cleared regularly. Use sand or salt on walking paths during winter. Clean up any spills in your garage right away. This includes oil or grease spills. What can I do in the bathroom? Use night lights. Install grab bars by the toilet and in the tub and shower. Do not use towel bars as grab bars. Use non-skid mats or decals in the tub or shower. If you need to sit down in the shower, use a plastic,  non-slip stool. Keep the floor dry. Clean up any water that spills on the floor as soon as it happens. Remove soap buildup in the tub or shower regularly. Attach bath mats securely with double-sided non-slip rug tape. Do not have throw rugs and other things on the floor that can make you trip. What can I do in the bedroom? Use night lights. Make sure that you have a light by your bed that is easy to reach. Do not use any sheets or blankets that are too big for your bed. They should not hang down onto the floor. Have a firm chair that has side arms. You can use this for support while you get dressed. Do not have throw rugs and other things on the floor that can make you trip. What can I do in the kitchen? Clean up any spills right away. Avoid walking on wet floors. Keep items that you use a lot in easy-to-reach places. If you need to reach something above you, use a strong step stool that has a grab bar. Keep electrical cords out of the way. Do not use floor polish or wax that makes  floors slippery. If you must use wax, use non-skid floor wax. Do not have throw rugs and other things on the floor that can make you trip. What can I do with my stairs? Do not leave any items on the stairs. Make sure that there are handrails on both sides of the stairs and use them. Fix handrails that are broken or loose. Make sure that handrails are as long as the stairways. Check any carpeting to make sure that it is firmly attached to the stairs. Fix any carpet that is loose or worn. Avoid having throw rugs at the top or bottom of the stairs. If you do have throw rugs, attach them to the floor with carpet tape. Make sure that you have a light switch at the top of the stairs and the bottom of the stairs. If you do not have them, ask someone to add them for you. What else can I do to help prevent falls? Wear shoes that: Do not have high heels. Have rubber bottoms. Are comfortable and fit you well. Are closed  at the toe. Do not wear sandals. If you use a stepladder: Make sure that it is fully opened. Do not climb a closed stepladder. Make sure that both sides of the stepladder are locked into place. Ask someone to hold it for you, if possible. Clearly mark and make sure that you can see: Any grab bars or handrails. First and last steps. Where the edge of each step is. Use tools that help you move around (mobility aids) if they are needed. These include: Canes. Walkers. Scooters. Crutches. Turn on the lights when you go into a dark area. Replace any light bulbs as soon as they burn out. Set up your furniture so you have a clear path. Avoid moving your furniture around. If any of your floors are uneven, fix them. If there are any pets around you, be aware of where they are. Review your medicines with your doctor. Some medicines can make you feel dizzy. This can increase your chance of falling. Ask your doctor what other things that you can do to help prevent falls. This information is not intended to replace advice given to you by your health care provider. Make sure you discuss any questions you have with your health care provider. Document Released: 07/26/2009 Document Revised: 03/06/2016 Document Reviewed: 11/03/2014 Elsevier Interactive Patient Education  2017 [employer].

## 2023-02-24 ENCOUNTER — Ambulatory Visit: Payer: Medicare Other | Admitting: Internal Medicine

## 2023-03-10 ENCOUNTER — Ambulatory Visit: Payer: Medicare Other

## 2023-03-24 NOTE — Progress Notes (Unsigned)
Patient ID: Tammy Schwartz                 DOB: 1956-07-07                      MRN: [id number]     HPI: Tammy Schwartz is a 67 y.o. female referred by Dr. Tenny Craw to HTN clinic. PMH is significant for CAD with calcifications on CT scan, HTN, COPD, tobacco abuse, and PUD. Has had prior issues with hypokalemia and then hyperkalemia. Pt has been following with Melissa in HTN clinic. Previously given BP cuff to monitor her readings at home, also advised to stop using NSAIDs. At most recent visit on 02/06/23, BP was very elevated at 180/82. Pt was adherent to meds, had stopped smoking, but was under more stress. Her spironolactone was stopped and she was started on valsartan. Was scheduled to see both Melissa and Dr Tenny Craw in May but both appts were canceled by pt.  Pt presents today for follow up. Hasn't been taking amlodipine, thinks her PCP stopped this when she put her on valsartan. Isn't taking her rosuvastatin either although she has med at home, thought she was supposed to stop taking this as well. Denies dizziness, LE edema, and headache. Reports checking BP at home, recalls a systolic reading of 134. Still using old valsartan rx and taking 2 tabs of her 40mg  dose.  Current HTN meds: Amlodipine 10mg  daily - not taking Carvedilol 3.125mg  BID Hydralazine 75mg  TID (10AM breakfast, 3PM lunch, 8PM dinner)  Valsartan 80mg  daily  Previously tried: HCTZ, spironolactone  BP goal: <130/70mmHg   Family History:  The patient's family history includes CAD in her father; Hypertension in her sister; Stomach cancer in her mother.     Social History: The patient  reports that she has been smoking cigarettes. She has a 5.00 pack-year smoking history. She has never used smokeless tobacco. She reports that she does not drink alcohol and does not use drugs.     Diet: 1 cup of coffee, a 20 oz soda Chicken, beans, fish, ribs, pork chops Peas, green beans, broccoli   Exercise: exercise class  on her phone twice a week. Home exercise routine includes exercise video.  Wt Readings from Last 3 Encounters:  02/12/23 90 lb (40.8 kg)  10/15/22 83 lb (37.6 kg)  09/29/22 82 lb 3.2 oz (37.3 kg)   BP Readings from Last 3 Encounters:  02/06/23 (!) 180/82  01/13/23 134/80  11/24/22 (!) 140/70   Pulse Readings from Last 3 Encounters:  02/06/23 79  01/13/23 74  11/24/22 71    Renal function: CrCl cannot be calculated (Patient's most recent lab result is older than the maximum 21 days allowed.).  Past Medical History:  Diagnosis Date   Chronic back pain    HNp,spondylosis,radiculopathy   COPD (chronic obstructive pulmonary disease) (HCC)    GERD (gastroesophageal reflux disease)    Hyperlipidemia    was on choloesterol meds last yr-samples given in office but not needed since   Hypertension    takes Exforge daily   Joint pain    Shortness of breath    takes Singulair daily;with exertion   Sickle cell trait (HCC)    Spondylolysis    Tobacco use     Current Outpatient Medications on File Prior to Visit  Medication Sig Dispense Refill   albuterol (VENTOLIN HFA) 108 (90 Base) MCG/ACT inhaler INHALE 2 PUFFS INTO THE LUNGS EVERY 6 HOURS AS NEEDED 17 g  2   amLODipine (NORVASC) 10 MG tablet TAKE 1 TABLET(10 MG) BY MOUTH DAILY (Patient not taking: Reported on 02/12/2023) 90 tablet 1   aspirin EC 81 MG tablet Take 1 tablet (81 mg total) by mouth daily. Swallow whole. 100 tablet 3   azelastine (ASTELIN) 0.1 % nasal spray Place 2 sprays into both nostrils 2 (two) times daily. 30 mL 0   buPROPion (WELLBUTRIN XL) 150 MG 24 hr tablet Take 1 tablet (150 mg total) by mouth daily. 90 tablet 1   carvedilol (COREG) 3.125 MG tablet TAKE 1 TABLET (3.125 MG TOTAL) BY MOUTH 2 (TWO) TIMES DAILY WITH A MEAL. 180 tablet 1   cetirizine (ZYRTEC) 10 MG tablet TAKE 1 TABLET BY MOUTH DAILY. 90 tablet 1   hydrALAZINE (APRESOLINE) 50 MG tablet Take 1.5 tablets (75 mg total) by mouth 3 (three) times daily.  405 tablet 3   Magnesium Glycinate 100 MG CAPS Take 200 mg by mouth daily. (Patient not taking: Reported on 02/12/2023) 60 capsule 11   meclizine (ANTIVERT) 25 MG tablet Take 1 tablet (25 mg total) by mouth 3 (three) times daily as needed for dizziness. (Patient not taking: Reported on 02/12/2023) 60 tablet 1   mirtazapine (REMERON) 30 MG tablet TAKE 1 TABLET(30 MG) BY MOUTH AT BEDTIME 90 tablet 1   montelukast (SINGULAIR) 10 MG tablet Take 10 mg by mouth daily. (Patient not taking: Reported on 11/24/2022)     Multiple Vitamin (MULTIVITAMIN WITH MINERALS) TABS tablet Take 1 tablet by mouth daily. 30 tablet 3   naproxen (NAPROSYN) 375 MG tablet Take 1 tablet (375 mg total) by mouth 2 (two) times daily. (Patient not taking: Reported on 02/12/2023) 10 tablet 0   pantoprazole (PROTONIX) 40 MG tablet TAKE 1 TABLET(40 MG) BY MOUTH DAILY 90 tablet 0   polyethylene glycol powder (GLYCOLAX/MIRALAX) powder Take 17 g by mouth daily. 3350 g 1   rosuvastatin (CRESTOR) 10 MG tablet Take 1 tablet (10 mg total) by mouth daily. (Patient not taking: Reported on 02/12/2023) 90 tablet 1   tiZANidine (ZANAFLEX) 4 MG tablet TAKE 1 TABLET(4 MG) BY MOUTH AT BEDTIME AS NEEDED FOR MUSCLE SPASMS 30 tablet 1   triamcinolone cream (KENALOG) 0.1 % APPLY 1 APPLICATION TOPICALLY 2 (TWO) TIMES DAILY. (Patient not taking: Reported on 02/12/2023) 80 g 0   valsartan (DIOVAN) 40 MG tablet Take 2 tablets (80 mg total) by mouth daily. 60 tablet 0   No current facility-administered medications on file prior to visit.    No Known Allergies   Assessment/Plan:  1. Hypertension - BP elevated above goal < 130/49mmHg. Checking BMET today with recent MRA stop and valsartan start. Sounds like pt stopped her amlodipine, she will double check when she gets home and looks through her pill bottles. I have advised her to restart her amlodipine 10mg  daily and continue on valsartan 80mg  daily, carvedilol 3.125mg  BID, and hydralazine 75mg  TID. I'll call her on  Friday when I'm back in the office with her BMET results. If she reports she has been taking the amlodipine and if her BMET ist table, would plan to increase her valsartan. Will schedule f/u at that time.  Ladarian Bonczek E. Sheyenne Konz, PharmD, BCACP, CPP [facility] [address] [place], [place] [address] Phone: [phone]; Fax: [phone] 03/25/2023 2:37 PM

## 2023-03-25 ENCOUNTER — Ambulatory Visit: Payer: Medicare Other | Attending: Cardiology | Admitting: Pharmacist

## 2023-03-25 ENCOUNTER — Other Ambulatory Visit (HOSPITAL_COMMUNITY): Payer: Self-pay

## 2023-03-25 VITALS — BP 168/78 | HR 71

## 2023-03-25 DIAGNOSIS — I10 Essential (primary) hypertension: Secondary | ICD-10-CM | POA: Diagnosis not present

## 2023-03-25 MED ORDER — ROSUVASTATIN CALCIUM 10 MG PO TABS: 10.0000 mg | ORAL_TABLET | Freq: Every day | ORAL | 3 refills | Status: DC

## 2023-03-25 MED ORDER — AMLODIPINE BESYLATE 10 MG PO TABS
ORAL_TABLET | ORAL | 3 refills | Status: DC
Start: 2023-03-25 — End: 2023-12-16

## 2023-03-25 NOTE — Patient Instructions (Addendum)
Check your pills when you get home, you should be taking amlodipine 10mg  - 1 tablet daily  If you have not been taking this, restart it - I sent in a new prescription to your pharmacy  If you have been taking it, we'll likely increase the dose of your valsartan depending on how your labs look today  Your blood pressure mediations should be: Amlodipine 10mg  daily Carvedilol 3.125mg  twice daily Hydralazine 75mg  3 times a day Valsartan 80mg  daily

## 2023-03-26 ENCOUNTER — Encounter: Payer: Self-pay | Admitting: Pharmacist

## 2023-03-26 ENCOUNTER — Other Ambulatory Visit: Payer: Self-pay | Admitting: Pharmacist

## 2023-03-26 ENCOUNTER — Telehealth: Payer: Self-pay | Admitting: Cardiology

## 2023-03-26 DIAGNOSIS — I10 Essential (primary) hypertension: Secondary | ICD-10-CM

## 2023-03-26 DIAGNOSIS — E876 Hypokalemia: Secondary | ICD-10-CM

## 2023-03-26 LAB — BASIC METABOLIC PANEL
BUN/Creatinine Ratio: 10 — ABNORMAL LOW (ref 12–28)
BUN: 8 mg/dL (ref 8–27)
CO2: 24 mmol/L (ref 20–29)
Calcium: 9.7 mg/dL (ref 8.7–10.3)
Chloride: 105 mmol/L (ref 96–106)
Creatinine, Ser: 0.81 mg/dL (ref 0.57–1.00)
Glucose: 82 mg/dL (ref 70–99)
Potassium: 2.9 mmol/L — CL (ref 3.5–5.2)
Sodium: 145 mmol/L — ABNORMAL HIGH (ref 134–144)
eGFR: 80 mL/min/{1.73_m2} (ref >59)

## 2023-03-26 MED ORDER — POTASSIUM CHLORIDE CRYS ER 20 MEQ PO TBCR
EXTENDED_RELEASE_TABLET | ORAL | 2 refills | Status: DC
Start: 2023-03-26 — End: 2023-04-06

## 2023-03-26 MED ORDER — POTASSIUM CHLORIDE CRYS ER 20 MEQ PO TBCR: EXTENDED_RELEASE_TABLET | ORAL | 0 refills | Status: DC

## 2023-03-26 MED ORDER — POTASSIUM CHLORIDE CRYS ER 20 MEQ PO TBCR: EXTENDED_RELEASE_TABLET | ORAL | 2 refills | Status: DC

## 2023-03-26 NOTE — Telephone Encounter (Signed)
Called patient to discuss lab. LVM to call back on [phone] , also sent MyChart MSG Prescription for potassium sent to [facility]. Follow up BMP scheduled for Monday at [address] lab

## 2023-03-26 NOTE — Telephone Encounter (Signed)
errror

## 2023-03-26 NOTE — Addendum Note (Signed)
Addended by: Tylene Fantasia on: 03/26/2023 08:04 AM   Modules accepted: Orders

## 2023-03-26 NOTE — Telephone Encounter (Signed)
Received overnight page from [facility] for critical lab result on BMP sent 03/25/23. Critical lab is K of 2.9 (Cr stable at 0.81). Chart review shows that patient has had hypokalemia in the past back in 10/2022 and needed to increase her potassium supplementation and increase in her MRA (then complicated by some hyperkalemia so changes reversed). Review of chart shows that she had a recent stop of her MRA and start of valsartan; with plan to re-check BMP and titrate BP meds accordingly. Will tag ordering provider Megan Supple about low K to provide additional potassium supplementation or prescribe MRA to assist when pharmacies open in the morning. Anticipate will need repeat BMP again after medication changes to ensure K returns to WNL. Will also FYI Dr. Tenny Craw, her cardiologist who saw her in 10/2022 and helped with her prior episode of hypokalemia as well.  Bella Kennedy, MD Cardiology 03/26/2023

## 2023-03-26 NOTE — Addendum Note (Signed)
Addended by: Tylene Fantasia on: 03/26/2023 09:32 AM   Modules accepted: Orders

## 2023-03-27 MED ORDER — VALSARTAN 160 MG PO TABS: 160.0000 mg | ORAL_TABLET | Freq: Every day | ORAL | 5 refills | Status: DC

## 2023-03-27 NOTE — Telephone Encounter (Signed)
Called pt, no answer, left message. She also needs adjustment in her BP medication. Will plan to increase her valsartan from 80mg  to 320mg  to help increase K as well as her HTN. Needs f/u BP appt scheduled.

## 2023-03-27 NOTE — Addendum Note (Signed)
Addended by: Jase Reep E on: 03/27/2023 12:39 PM   Modules accepted: Orders

## 2023-03-27 NOTE — Addendum Note (Signed)
Addended by: Hermila Millis E on: 03/27/2023 12:39 PM   Modules accepted: Orders  

## 2023-03-27 NOTE — Telephone Encounter (Signed)
Called pt again, she states she picked up potassium yesterday but didn't take it.  Advised her to take 2 pills today, 2 pills tomorrow, 1 pill Sunday and 1 pill Monday. She will come in Monday for BMET. Advised her to increase valsartan, will increase to 160mg  instead of 320mg  because she states she didn't have any amlodipine at home, she did pick this up yesterday and will start taking it.  Will call pt when BMET results next Monday and will make f/u HTN appt at that time depending on if med changes are needed again.

## 2023-03-30 ENCOUNTER — Ambulatory Visit: Payer: Medicare Other | Attending: Cardiology

## 2023-03-30 DIAGNOSIS — I10 Essential (primary) hypertension: Secondary | ICD-10-CM | POA: Diagnosis not present

## 2023-03-31 ENCOUNTER — Telehealth: Payer: Self-pay | Admitting: Pharmacist

## 2023-03-31 ENCOUNTER — Ambulatory Visit: Payer: Self-pay | Admitting: Family Medicine

## 2023-03-31 LAB — BASIC METABOLIC PANEL
BUN/Creatinine Ratio: 9 — ABNORMAL LOW (ref 12–28)
BUN: 7 mg/dL — ABNORMAL LOW (ref 8–27)
CO2: 20 mmol/L (ref 20–29)
Calcium: 9.8 mg/dL (ref 8.7–10.3)
Chloride: 108 mmol/L — ABNORMAL HIGH (ref 96–106)
Creatinine, Ser: 0.81 mg/dL (ref 0.57–1.00)
Glucose: 85 mg/dL (ref 70–99)
Potassium: 3.8 mmol/L (ref 3.5–5.2)
Sodium: 146 mmol/L — ABNORMAL HIGH (ref 134–144)
eGFR: 80 mL/min/{1.73_m2} (ref >59)

## 2023-03-31 NOTE — Telephone Encounter (Signed)
Called pt to review BMT from yesterday, left message.  K improved after supplementation over the weekend. Rest of her BMET is stable. Will see how home BP reading are looking on dose increase of valsartan. If elevated, will further increase valsartan to 320mg  daily for BP lowering and to help keep K up. Will need f/u appt scheduled with me for BP check and BMET in about 2 weeks.

## 2023-04-01 ENCOUNTER — Other Ambulatory Visit: Payer: Self-pay | Admitting: Cardiology

## 2023-04-01 ENCOUNTER — Other Ambulatory Visit: Payer: Self-pay | Admitting: Family Medicine

## 2023-04-01 DIAGNOSIS — I10 Essential (primary) hypertension: Secondary | ICD-10-CM

## 2023-04-01 DIAGNOSIS — E876 Hypokalemia: Secondary | ICD-10-CM

## 2023-04-01 NOTE — Telephone Encounter (Signed)
Called pt again, call went to VM, left another message.

## 2023-04-03 NOTE — Telephone Encounter (Signed)
3rd voicemail left for pt

## 2023-04-06 ENCOUNTER — Ambulatory Visit: Payer: Medicare Other | Admitting: Family Medicine

## 2023-04-06 MED ORDER — VALSARTAN 320 MG PO TABS: 320.0000 mg | ORAL_TABLET | Freq: Every day | ORAL | 3 refills | Status: DC

## 2023-04-06 NOTE — Telephone Encounter (Signed)
Pt returned call to clinic. Asked if there is a better # to reach her as it has been very difficult to get through to her with lab results and requires many calls from the office. States she was out seeing her grandbaby and #s on file are correct.  Reports systolic BP at home of 140. Will increase her valsartan to 320mg  daily to help with BP and keep her K up. States she just picked up the 160mg  dose recently - she is aware to take 2 tabs each day, and when this runs out to take 1 of the 320mg  tablet. She sees her PCP in 2 weeks for BP check, I have sent them a message asking them to recheck BMET at that time. Can schedule PharmD f/u if needed after.

## 2023-04-22 ENCOUNTER — Other Ambulatory Visit: Payer: Self-pay | Admitting: Family Medicine

## 2023-04-22 ENCOUNTER — Encounter: Payer: Self-pay | Admitting: Family Medicine

## 2023-04-22 ENCOUNTER — Ambulatory Visit: Payer: Medicare Other | Attending: Family Medicine | Admitting: Family Medicine

## 2023-04-22 VITALS — BP 122/70 | HR 67 | Temp 98.2°F | Ht 61.0 in | Wt 84.8 lb

## 2023-04-22 DIAGNOSIS — I10 Essential (primary) hypertension: Secondary | ICD-10-CM | POA: Diagnosis not present

## 2023-04-22 DIAGNOSIS — E440 Moderate protein-calorie malnutrition: Secondary | ICD-10-CM | POA: Diagnosis not present

## 2023-04-22 DIAGNOSIS — F17210 Nicotine dependence, cigarettes, uncomplicated: Secondary | ICD-10-CM | POA: Diagnosis not present

## 2023-04-22 DIAGNOSIS — J449 Chronic obstructive pulmonary disease, unspecified: Secondary | ICD-10-CM

## 2023-04-22 DIAGNOSIS — E876 Hypokalemia: Secondary | ICD-10-CM

## 2023-04-22 DIAGNOSIS — L299 Pruritus, unspecified: Secondary | ICD-10-CM | POA: Diagnosis not present

## 2023-04-22 DIAGNOSIS — I700 Atherosclerosis of aorta: Secondary | ICD-10-CM

## 2023-04-22 MED ORDER — CETIRIZINE HCL 10 MG PO TABS
ORAL_TABLET | Freq: Every day | ORAL | 1 refills | Status: DC
Start: 2023-04-22 — End: 2023-12-16

## 2023-04-22 NOTE — Progress Notes (Signed)
Subjective:  Patient ID: Tammy Schwartz, female    DOB: 1956-07-14  Age: 67 y.o. MRN: [id number]  CC: Hypertension   HPI Tammy Schwartz is a 67 y.o. year old female with a history of hypertension, tobacco abuse (greater than 20-pack-year history), COPD, CAD (LAD calcifications on CT scan) lumbar spondylosis status post back surgery who presents today for follow-up visit.   Interval History: Discussed the use of AI scribe software for clinical note transcription with the patient, who gave verbal consent to proceed.  She reports that her blood pressure is now 'perfect' after recent adjustments by her cardiologist. She is currently on four antihypertensives: carvedilol, amlodipine, hydralazine, and Diovan.  She also takes Crestor for hyperlipidemia, tizanidine as a muscle relaxant, mirtazapine for appetite and sleep, and Wellbutrin to help quit smoking. She also takes a PPI for acid reflux. She reports that she is back on potassium supplementation, but the dose is unclear. She is currently smoking less than one cigarette per day. Her being underweight continues to be a problem when she has undergone workup by GI and imaging with workup so far unrevealing. She reports a good appetite and is exercising her arms and legs.        Past Medical History:  Diagnosis Date   Chronic back pain    HNp,spondylosis,radiculopathy   COPD (chronic obstructive pulmonary disease) (HCC)    GERD (gastroesophageal reflux disease)    Hyperlipidemia    was on choloesterol meds last yr-samples given in office but not needed since   Hypertension    takes Exforge daily   Joint pain    Shortness of breath    takes Singulair daily;with exertion   Sickle cell trait (HCC)    Spondylolysis    Tobacco use     Past Surgical History:  Procedure Laterality Date   BACK SURGERY     BREAST EXCISIONAL BIOPSY Left 2007   benign   BREAST SURGERY Left    lumpectomy   COLONOSCOPY     DILATION  AND CURETTAGE OF UTERUS     LUMBAR LAMINECTOMY/DECOMPRESSION MICRODISCECTOMY  06/04/2012   Procedure: LUMBAR LAMINECTOMY/DECOMPRESSION MICRODISCECTOMY 1 LEVEL;  Surgeon: Carmela Hurt, MD;  Location: [facility];  Service: Neurosurgery;  Laterality: Left;  LEFT Lumbar five-sacral one diskectomy   LUMBAR LAMINECTOMY/DECOMPRESSION MICRODISCECTOMY Right 06/16/2013   Procedure: RIGHT Lumbar Five-Sacral One Microdiskectomy;  Surgeon: Carmela Hurt, MD;  Location: [facility];  Service: Neurosurgery;  Laterality: Right;  RIGHT Lumbar Five-Sacral One Microdiskectomy   LUMBAR LAMINECTOMY/DECOMPRESSION MICRODISCECTOMY Right 01/20/2014   Procedure: LUMBAR FOUR TO LUMBAR FIVE LUMBAR LAMINECTOMY/DECOMPRESSION MICRODISCECTOMY 1 LEVEL;  Surgeon: Carmela Hurt, MD;  Location: [facility];  Service: Neurosurgery;  Laterality: Right;  Right L45 laminectomy and foramintomy    Family History  Problem Relation Age of Onset   Stomach cancer Mother    Cancer - Other Mother    CAD Father        Died age 52   Hypertension Sister     Social History   Socioeconomic History   Marital status: Married    Spouse name: Not on file   Number of children: Not on file   Years of education: Not on file   Highest education level: Not on file  Occupational History   Not on file  Tobacco Use   Smoking status: Former    Packs/day: 0.25    Years: 20.00    Additional pack years: 0.00    Total  pack years: 5.00    Types: Cigarettes   Smokeless tobacco: Never  Vaping Use   Vaping Use: Never used  Substance and Sexual Activity   Alcohol use: No   Drug use: No   Sexual activity: Yes    Birth control/protection: Post-menopausal  Other Topics Concern   Not on file  Social History Narrative   Lives with husband and son.    Social Determinants of Health   Financial Resource Strain: Low Risk  (02/12/2023)   Overall Financial Resource Strain (CARDIA)    Difficulty of Paying Living Expenses: Not hard at all  Food  Insecurity: No Food Insecurity (02/12/2023)   Hunger Vital Sign    Worried About Running Out of Food in the Last Year: Never true    Ran Out of Food in the Last Year: Never true  Transportation Needs: No Transportation Needs (02/12/2023)   PRAPARE - Administrator, Civil Service (Medical): No    Lack of Transportation (Non-Medical): No  Physical Activity: Insufficiently Active (02/12/2023)   Exercise Vital Sign    Days of Exercise per Week: 2 days    Minutes of Exercise per Session: 20 min  Stress: No Stress Concern Present (02/12/2023)   [employer] of Occupational Health - Occupational Stress Questionnaire    Feeling of Stress : Not at all  Social Connections: Socially Integrated (04/23/2021)   Social Connection and Isolation Panel [NHANES]    Frequency of Communication with Friends and Family: More than three times a week    Frequency of Social Gatherings with Friends and Family: More than three times a week    Attends Religious Services: More than 4 times per year    Active Member of [employer] or Organizations: Yes    Attends Engineer, structural: More than 4 times per year    Marital Status: Married    No Known Allergies  Outpatient Medications Prior to Visit  Medication Sig Dispense Refill   albuterol (VENTOLIN HFA) 108 (90 Base) MCG/ACT inhaler INHALE 2 PUFFS INTO THE LUNGS EVERY 6 HOURS AS NEEDED 17 g 2   amLODipine (NORVASC) 10 MG tablet TAKE 1 TABLET(10 MG) BY MOUTH DAILY 90 tablet 3   aspirin EC 81 MG tablet Take 1 tablet (81 mg total) by mouth daily. Swallow whole. 100 tablet 3   azelastine (ASTELIN) 0.1 % nasal spray Place 2 sprays into both nostrils 2 (two) times daily. 30 mL 0   buPROPion (WELLBUTRIN XL) 150 MG 24 hr tablet Take 1 tablet (150 mg total) by mouth daily. 90 tablet 1   carvedilol (COREG) 3.125 MG tablet TAKE 1 TABLET(3.125 MG) BY MOUTH TWICE DAILY WITH A MEAL 180 tablet 0   hydrALAZINE (APRESOLINE) 50 MG tablet Take 1.5 tablets (75 mg total)  by mouth 3 (three) times daily. 405 tablet 3   meclizine (ANTIVERT) 25 MG tablet Take 1 tablet (25 mg total) by mouth 3 (three) times daily as needed for dizziness. 60 tablet 1   mirtazapine (REMERON) 30 MG tablet TAKE 1 TABLET(30 MG) BY MOUTH AT BEDTIME 90 tablet 1   montelukast (SINGULAIR) 10 MG tablet Take 10 mg by mouth daily.     Multiple Vitamin (MULTIVITAMIN WITH MINERALS) TABS tablet Take 1 tablet by mouth daily. 30 tablet 3   pantoprazole (PROTONIX) 40 MG tablet TAKE 1 TABLET(40 MG) BY MOUTH DAILY 90 tablet 0   polyethylene glycol powder (GLYCOLAX/MIRALAX) powder Take 17 g by mouth daily. 3350 g 1   rosuvastatin (CRESTOR)  10 MG tablet Take 1 tablet (10 mg total) by mouth daily. 90 tablet 3   tiZANidine (ZANAFLEX) 4 MG tablet TAKE 1 TABLET(4 MG) BY MOUTH AT BEDTIME AS NEEDED FOR MUSCLE SPASMS 30 tablet 1   triamcinolone cream (KENALOG) 0.1 % APPLY 1 APPLICATION TOPICALLY 2 (TWO) TIMES DAILY. 80 g 0   valsartan (DIOVAN) 320 MG tablet Take 1 tablet (320 mg total) by mouth daily. 90 tablet 3   cetirizine (ZYRTEC) 10 MG tablet TAKE 1 TABLET BY MOUTH DAILY. 90 tablet 1   No facility-administered medications prior to visit.     ROS Review of Systems  Constitutional:  Negative for activity change and appetite change.  HENT:  Negative for sinus pressure and sore throat.   Respiratory:  Negative for chest tightness, shortness of breath and wheezing.   Cardiovascular:  Negative for chest pain and palpitations.  Gastrointestinal:  Negative for abdominal distention, abdominal pain and constipation.  Genitourinary: Negative.   Musculoskeletal: Negative.   Psychiatric/Behavioral:  Negative for behavioral problems and dysphoric mood.     Objective:  BP 122/70   Pulse 67   Temp 98.2 F (36.8 C) (Oral)   Ht 5\' 1"  (1.549 m)   Wt 84 lb 12.8 oz (38.5 kg)   LMP 03/13/2006   SpO2 97%   BMI 16.02 kg/m      04/22/2023    2:22 PM 03/25/2023    2:27 PM 03/25/2023    2:22 PM  BP/Weight   Systolic BP 122 168 168  Diastolic BP 70 78 70  Wt. (Lbs) 84.8    BMI 16.02 kg/m2      Wt Readings from Last 3 Encounters:  04/22/23 84 lb 12.8 oz (38.5 kg)  02/12/23 90 lb (40.8 kg)  10/15/22 83 lb (37.6 kg)      Physical Exam Constitutional:      Appearance: She is well-developed.  Cardiovascular:     Rate and Rhythm: Normal rate.     Heart sounds: Normal heart sounds. No murmur heard. Pulmonary:     Effort: Pulmonary effort is normal.     Breath sounds: Normal breath sounds. No wheezing or rales.  Chest:     Chest wall: No tenderness.  Abdominal:     General: Bowel sounds are normal. There is no distension.     Palpations: Abdomen is soft. There is no mass.     Tenderness: There is no abdominal tenderness.  Musculoskeletal:        General: Normal range of motion.     Right lower leg: No edema.     Left lower leg: No edema.  Neurological:     Mental Status: She is alert and oriented to person, place, and time.  Psychiatric:        Mood and Affect: Mood normal.        Latest Ref Rng & Units 03/30/2023    2:13 PM 03/25/2023    2:36 PM 01/13/2023    9:47 AM  CMP  Glucose 70 - 99 mg/dL 85  82  81   BUN 8 - 27 mg/dL 7  8  9    Creatinine 0.57 - 1.00 mg/dL 1.61  0.96  0.45   Sodium 134 - 144 mmol/L 146  145  145   Potassium 3.5 - 5.2 mmol/L 3.8  2.9  4.3   Chloride 96 - 106 mmol/L 108  105  109   CO2 20 - 29 mmol/L 20  24  24    Calcium 8.7 -  10.3 mg/dL 9.8  9.7  9.2     Lipid Panel     Component Value Date/Time   CHOL 160 12/02/2021 1220   TRIG 49 12/02/2021 1220   HDL 122 12/02/2021 1220   CHOLHDL 2.0 01/01/2018 1223   CHOLHDL 2.3 05/07/2016 0739   VLDL 20 05/07/2016 0739   LDLCALC 27 12/02/2021 1220    CBC    Component Value Date/Time   WBC 5.5 12/02/2021 1220   WBC 5.0 07/31/2021 1022   RBC 4.61 12/02/2021 1220   RBC 4.87 07/31/2021 1022   HGB 13.8 12/02/2021 1220   HCT 42.1 12/02/2021 1220   PLT 258 12/02/2021 1220   MCV 91 12/02/2021 1220    MCH 29.9 12/02/2021 1220   MCH 29.0 02/23/2020 1838   MCHC 32.8 12/02/2021 1220   MCHC 33.2 07/31/2021 1022   RDW 15.8 (H) 12/02/2021 1220   LYMPHSABS 2.1 12/02/2021 1220   MONOABS 0.4 07/31/2021 1022   EOSABS 0.1 12/02/2021 1220   BASOSABS 0.0 12/02/2021 1220    Lab Results  Component Value Date   HGBA1C 5.7 (H) 12/02/2021    Lab Results  Component Value Date   TSH 0.778 12/02/2021    Assessment & Plan:      Hypertension: Well controlled on multiple medications (Carvedilol, Amlodipine, Hydralazine, Diovan). Patient expressed concern about the number of medications, but understands the necessity for blood pressure control. -Continue current regimen. -Check potassium due to ARB dose change by Cardiology.  Smoking Cessation: Patient reports reduced smoking to approximately one cigarette per day. Currently on medication to assist with cessation. -Encourage continued reduction and eventual cessation of smoking.  Hyperlipidemia: On Crestor for cholesterol management. -Continue current regimen.  Gastroesophageal Reflux Disease (GERD): Patient reports ongoing acid reflux. -Continue current medication.  Potassium Supplementation: Patient reports resuming potassium supplementation, but not listed in medication list. -Confirm dosage and frequency of potassium supplementation.  General Health Maintenance: -Order refill for Allentac (Zyrtec) for allergies. -Order annual CT scan for lung cancer screening due to history of smoking. Patient has not completed previously ordered scan. -Follow-up in six months, or sooner if any concerns arise.           Meds ordered this encounter  Medications   cetirizine (ZYRTEC) 10 MG tablet    Sig: TAKE 1 TABLET BY MOUTH DAILY.    Dispense:  90 tablet    Refill:  1    Follow-up: Return in about 6 months (around 10/23/2023) for Chronic medical conditions.       Hoy Register, MD, FAAFP. [facility]  [place], [place] [phone]   04/22/2023, 5:39 PM

## 2023-04-22 NOTE — Patient Instructions (Signed)

## 2023-04-23 LAB — BASIC METABOLIC PANEL
BUN/Creatinine Ratio: 18 (ref 12–28)
BUN: 16 mg/dL (ref 8–27)
CO2: 23 mmol/L (ref 20–29)
Calcium: 9.7 mg/dL (ref 8.7–10.3)
Chloride: 104 mmol/L (ref 96–106)
Creatinine, Ser: 0.87 mg/dL (ref 0.57–1.00)
Glucose: 75 mg/dL (ref 70–99)
Potassium: 5.2 mmol/L (ref 3.5–5.2)
Sodium: 140 mmol/L (ref 134–144)
eGFR: 73 mL/min/{1.73_m2} (ref >59)

## 2023-05-13 ENCOUNTER — Other Ambulatory Visit (HOSPITAL_COMMUNITY): Payer: Self-pay

## 2023-06-08 ENCOUNTER — Other Ambulatory Visit: Payer: Self-pay | Admitting: Family Medicine

## 2023-06-08 DIAGNOSIS — F17210 Nicotine dependence, cigarettes, uncomplicated: Secondary | ICD-10-CM

## 2023-06-19 ENCOUNTER — Telehealth: Payer: Self-pay

## 2023-06-19 NOTE — Telephone Encounter (Signed)
Called to reschedule patient from Dr. Tenny Craw on Monday to seeing her on Tuesday. Okay to double book.

## 2023-06-22 ENCOUNTER — Ambulatory Visit: Payer: Medicare Other | Admitting: Internal Medicine

## 2023-06-22 NOTE — Progress Notes (Unsigned)
Cardiology Office Note   Date:  06/22/2023   ID:  Tammy Schwartz, DOB 20-May-1956, MRN [id number]  PCP:  Hoy Register, MD  Cardiologist:   Dietrich Pates, MD   Patient prsents for follow up of CAD    History of Present Illness: Tammy Schwartz is a 67 y.o. female with a hx of tobacco abuse, COPD and CAD (seen as calcifications onf CT scan, extensive in LM, LAD)  Pt also has a hx of wt loss (initial 120 lb; seen by GI)  She also has a hx of PUD   CT coronary angio in Nov 2022 shows mild to mod dz , no flow limiting lesions   I saw the pt in Jan 2024  She has been seen by Pharmacy since  No outpatient medications have been marked as taking for the 06/23/23 encounter (Appointment) with Pricilla Riffle, MD.     Allergies:   Patient has no known allergies.   Past Medical History:  Diagnosis Date   Chronic back pain    HNp,spondylosis,radiculopathy   COPD (chronic obstructive pulmonary disease) (HCC)    GERD (gastroesophageal reflux disease)    Hyperlipidemia    was on choloesterol meds last yr-samples given in office but not needed since   Hypertension    takes Exforge daily   Joint pain    Shortness of breath    takes Singulair daily;with exertion   Sickle cell trait (HCC)    Spondylolysis    Tobacco use     Past Surgical History:  Procedure Laterality Date   BACK SURGERY     BREAST EXCISIONAL BIOPSY Left 2007   benign   BREAST SURGERY Left    lumpectomy   COLONOSCOPY     DILATION AND CURETTAGE OF UTERUS     LUMBAR LAMINECTOMY/DECOMPRESSION MICRODISCECTOMY  06/04/2012   Procedure: LUMBAR LAMINECTOMY/DECOMPRESSION MICRODISCECTOMY 1 LEVEL;  Surgeon: Carmela Hurt, MD;  Location: [facility];  Service: Neurosurgery;  Laterality: Left;  LEFT Lumbar five-sacral one diskectomy   LUMBAR LAMINECTOMY/DECOMPRESSION MICRODISCECTOMY Right 06/16/2013   Procedure: RIGHT Lumbar Five-Sacral One Microdiskectomy;  Surgeon: Carmela Hurt, MD;  Location: [facility];   Service: Neurosurgery;  Laterality: Right;  RIGHT Lumbar Five-Sacral One Microdiskectomy   LUMBAR LAMINECTOMY/DECOMPRESSION MICRODISCECTOMY Right 01/20/2014   Procedure: LUMBAR FOUR TO LUMBAR FIVE LUMBAR LAMINECTOMY/DECOMPRESSION MICRODISCECTOMY 1 LEVEL;  Surgeon: Carmela Hurt, MD;  Location: [facility];  Service: Neurosurgery;  Laterality: Right;  Right L45 laminectomy and foramintomy     Social History:  The patient  reports that she has quit smoking. Her smoking use included cigarettes. She has a 5 pack-year smoking history. She has never used smokeless tobacco. She reports that she does not drink alcohol and does not use drugs.   Family History:  The patient's family history includes CAD in her father; Cancer - Other in her mother; Hypertension in her sister; Stomach cancer in her mother.    ROS:  Please see the history of present illness. All other systems are reviewed and  Negative to the above problem except as noted.    PHYSICAL EXAM: VS:  LMP 03/13/2006   GEN: Thin 67 yo  in no acute distress  HEENT: normal  Neck: no JVD, carotid bruits Cardiac: RRR; no murmurs No LE edema  Respiratory:    Moving air  No rales  GI: soft, nontender, nondistended, + BS  No hepatomegaly  MS: no deformity Moving all extremities   Skin: warm and dry,  no rash Neuro:  Strength and sensation are intact Psych: euthymic mood, full affect   EKG:  EKG shows SB 57 bpm   Septal MI    CT scan   08/2021  Coronary Arteries:  Normal coronary origin.  Right dominance.   RCA is a large dominant artery that gives rise to PDA and PLA. There is mixed plaque in the proximal RCA causing 0-24% stenosis.   Left main is a large artery that gives rise to LAD and LCX arteries. There is calcified plaque in the left main causing 0-24% stenosis   LAD is a large vessel. There is calcified plaque in the proximal LAD causing 0-24% stenosis. There is calcified plaque in the proximal to mid LAD causing 25-49%  stenosis   LCX is a non-dominant artery that gives rise to one large OM1 branch. There is calcified plaque in the mid LCX causing 0-24% stenosis   Other findings:   Left Ventricle: Normal size   Left Atrium: Normal size   Pulmonary Veins: Normal configuration   Right Ventricle: Normal size   Right Atrium: Normal size   Cardiac valves: No calcifications   Thoracic aorta: Normal size   Pulmonary Arteries: Normal size   Systemic Veins: Normal drainage   Pericardium: Normal thickness   IMPRESSION: 1. Coronary calcium score of 180. This was 89th percentile for age and sex matched control.   2. Normal coronary origin with right dominance.   3. Nonobstructive CAD. Most significant lesion is calcified plaque in the proximal to mid LAD causing mild (25-49%) stenosis   CAD-RADS 2. Mild non-obstructive CAD (25-49%). Consider non-atherosclerotic causes of chest pain. Consider preventive therapy and risk factor modification. Lipid Panel    Component Value Date/Time   CHOL 160 12/02/2021 1220   TRIG 49 12/02/2021 1220   HDL 122 12/02/2021 1220   CHOLHDL 2.0 01/01/2018 1223   CHOLHDL 2.3 05/07/2016 0739   VLDL 20 05/07/2016 0739   LDLCALC 27 12/02/2021 1220      Wt Readings from Last 3 Encounters:  04/22/23 84 lb 12.8 oz (38.5 kg)  02/12/23 90 lb (40.8 kg)  10/15/22 83 lb (37.6 kg)      ASSESSMENT AND PLAN:  1  CAD Pt without symptoms of angina   2  HTN  BP is very high will add hydralazine 25 tid    Continue other meds   Follow up in in 3 months    3  Lipdis   LDL 27  HDL 122  Trig 49   Follow      Current medicines are reviewed at length with the patient today.  The patient does not have concerns regarding medicines.  Signed, Dietrich Pates, MD  06/22/2023 8:53 PM    [facility] [address] [place], [place], [place]  [address] Phone: [phone]; Fax: [phone]

## 2023-06-23 ENCOUNTER — Encounter: Payer: Self-pay | Admitting: Internal Medicine

## 2023-06-23 ENCOUNTER — Ambulatory Visit: Payer: Medicare Other | Attending: Internal Medicine | Admitting: Internal Medicine

## 2023-06-23 VITALS — BP 128/74 | HR 69 | Ht 61.0 in | Wt 85.4 lb

## 2023-06-23 DIAGNOSIS — I2510 Atherosclerotic heart disease of native coronary artery without angina pectoris: Secondary | ICD-10-CM

## 2023-06-23 DIAGNOSIS — E785 Hyperlipidemia, unspecified: Secondary | ICD-10-CM

## 2023-06-23 DIAGNOSIS — I10 Essential (primary) hypertension: Secondary | ICD-10-CM | POA: Diagnosis not present

## 2023-06-23 DIAGNOSIS — Z79899 Other long term (current) drug therapy: Secondary | ICD-10-CM

## 2023-06-23 NOTE — Patient Instructions (Signed)
Medication Instructions:   *If you need a refill on your cardiac medications before your next appointment, please call your pharmacy*   Lab Work: Bmet and nmr  If you have labs (blood work) drawn today and your tests are completely normal, you will receive your results only by: MyChart Message (if you have MyChart) OR A paper copy in the mail If you have any lab test that is abnormal or we need to change your treatment, we will call you to review the results.   Testing/Procedures:    Follow-Up: At [facility], you and your health needs are our priority.  As part of our continuing mission to provide you with exceptional heart care, we have created designated Provider Care Teams.  These Care Teams include your primary Cardiologist (physician) and Advanced Practice Providers (APPs -  Physician Assistants and Nurse Practitioners) who all work together to provide you with the care you need, when you need it.  We recommend signing up for the patient portal called "MyChart".  Sign up information is provided on this After Visit Summary.  MyChart is used to connect with patients for Virtual Visits (Telemedicine).  Patients are able to view lab/test results, encounter notes, upcoming appointments, etc.  Non-urgent messages can be sent to your provider as well.   To learn more about what you can do with MyChart, go to [email or web address].    Your next appointment:   5 month(s)  Dr Dietrich Pates    Other Instructions

## 2023-06-24 LAB — NMR, LIPOPROFILE
Cholesterol, Total: 161 mg/dL (ref 100–199)
HDL Particle Number: 40.7 umol/L (ref >=30.5)
HDL-C: 113 mg/dL (ref >39)
LDL Particle Number: <300 nmol/L (ref <1000)
LDL-C (NIH Calc): 24 mg/dL (ref 0–99)
LP-IR Score: 40 (ref <=45)
Small LDL Particle Number: <90 nmol/L (ref <=527)
Triglycerides: 152 mg/dL — ABNORMAL HIGH (ref 0–149)

## 2023-06-24 LAB — BASIC METABOLIC PANEL
BUN/Creatinine Ratio: 7 — ABNORMAL LOW (ref 12–28)
BUN: 5 mg/dL — ABNORMAL LOW (ref 8–27)
CO2: 23 mmol/L (ref 20–29)
Calcium: 9.5 mg/dL (ref 8.7–10.3)
Chloride: 107 mmol/L — ABNORMAL HIGH (ref 96–106)
Creatinine, Ser: 0.76 mg/dL (ref 0.57–1.00)
Glucose: 77 mg/dL (ref 70–99)
Potassium: 3.8 mmol/L (ref 3.5–5.2)
Sodium: 145 mmol/L — ABNORMAL HIGH (ref 134–144)
eGFR: 85 mL/min/{1.73_m2} (ref >59)

## 2023-06-26 ENCOUNTER — Other Ambulatory Visit: Payer: Self-pay | Admitting: Family Medicine

## 2023-06-26 DIAGNOSIS — M5416 Radiculopathy, lumbar region: Secondary | ICD-10-CM

## 2023-06-26 DIAGNOSIS — I10 Essential (primary) hypertension: Secondary | ICD-10-CM

## 2023-07-02 ENCOUNTER — Other Ambulatory Visit: Payer: Self-pay

## 2023-07-02 MED ORDER — ROSUVASTATIN CALCIUM 5 MG PO TABS: 5.0000 mg | ORAL_TABLET | Freq: Every day | ORAL | 3 refills | Status: DC

## 2023-07-06 ENCOUNTER — Other Ambulatory Visit: Payer: Self-pay

## 2023-07-06 ENCOUNTER — Ambulatory Visit: Payer: Medicare Other | Attending: Family Medicine

## 2023-07-06 DIAGNOSIS — Z23 Encounter for immunization: Secondary | ICD-10-CM

## 2023-07-06 MED ORDER — COVID-19 MRNA VAC-TRIS(PFIZER) 30 MCG/0.3ML IM SUSY
0.3000 mL | PREFILLED_SYRINGE | Freq: Once | INTRAMUSCULAR | 0 refills | Status: AC
Start: 2023-07-06 — End: 2023-07-07
  Filled 2023-07-06: qty 0.3, 1d supply, fill #0

## 2023-07-06 NOTE — Addendum Note (Signed)
Addended by: Elsie Lincoln F on: 07/06/2023 10:56 AM   Modules accepted: Level of Service

## 2023-07-06 NOTE — Progress Notes (Addendum)
Flu vaccine administered in left deltoid per protocols.  Information sheet given. Patient denies and pain or discomfort at injection site. Tolerated injection well no reaction.

## 2023-09-24 ENCOUNTER — Other Ambulatory Visit: Payer: Self-pay | Admitting: Internal Medicine

## 2023-10-26 ENCOUNTER — Ambulatory Visit: Payer: Medicare Other | Admitting: Family Medicine

## 2023-11-05 ENCOUNTER — Other Ambulatory Visit: Payer: Self-pay | Admitting: Family Medicine

## 2023-11-05 DIAGNOSIS — G4709 Other insomnia: Secondary | ICD-10-CM

## 2023-11-05 NOTE — Telephone Encounter (Signed)
Requested Prescriptions  Pending Prescriptions Disp Refills   mirtazapine (REMERON) 30 MG tablet [Pharmacy Med Name: MIRTAZAPINE 30MG  TABLETS] 90 tablet 0    Sig: TAKE 1 TABLET(30 MG) BY MOUTH AT BEDTIME     Psychiatry: Antidepressants - mirtazapine Failed - 11/05/2023  2:39 PM      Failed - Valid encounter within last 6 months    Recent Outpatient Visits           6 months ago Moderate protein-calorie malnutrition (HCC)   [facility] [place] Hoy Register, MD   1 year ago Myalgia   [facility] [place] Hoy Register, MD   1 year ago Moderate protein-calorie malnutrition [facility])   [facility] [facility] [place]. [facility] Hoy Register, MD   1 year ago Screening for diabetes mellitus   [facility] [place]. [facility] Hoy Register, MD   2 years ago Weight loss, unintentional   [place] [facility] [place] [facility] [place]. [facility] Hoy Register, MD       Future Appointments             In 1 month Hoy Register, MD [facility] [place] [facility] Of [place]. [facility]

## 2023-12-15 ENCOUNTER — Ambulatory Visit: Payer: Medicare Other | Attending: Family Medicine

## 2023-12-15 VITALS — Ht 61.0 in | Wt 90.0 lb

## 2023-12-15 DIAGNOSIS — F17210 Nicotine dependence, cigarettes, uncomplicated: Secondary | ICD-10-CM

## 2023-12-15 DIAGNOSIS — Z0000 Encounter for general adult medical examination without abnormal findings: Secondary | ICD-10-CM

## 2023-12-15 NOTE — Patient Instructions (Signed)
 Tammy Schwartz , Thank you for taking time to come for your Medicare Wellness Visit. I appreciate your ongoing commitment to your health goals. Please review the following plan we discussed and let me know if I can assist you in the future.   Referrals/Orders/Follow-Ups/Clinician Recommendations: Yes; Keep maintaining your health by keeping your appointments with Dr. Alvis Lemmings and any specialists that you may see.  Call us if you need anything.  Have a great year!!!!  This is a list of the screening recommended for you and due dates:  Health Maintenance  Topic Date Due   Zoster (Shingles) Vaccine (1 of 2) Never done   Screening for Lung Cancer  08/22/2022   COVID-19 Vaccine (6 - 2024-25 season) 08/31/2023   Medicare Annual Wellness Visit  12/14/2024   Colon Cancer Screening  12/26/2024   Mammogram  01/04/2025   DTaP/Tdap/Td vaccine (2 - Td or Tdap) 08/01/2027   Pneumonia Vaccine  Completed   Flu Shot  Completed   DEXA scan (bone density measurement)  Completed   Hepatitis C Screening  Completed   HPV Vaccine  Aged Out    Advanced directives: (Declined) Advance directive discussed with you today. Even though you declined this today, please call our office should you change your mind, and we can give you the proper paperwork for you to fill out.  Next Medicare Annual Wellness Visit scheduled for next year: Yes

## 2023-12-15 NOTE — Progress Notes (Signed)
 Subjective:   Tammy Schwartz is a 68 y.o. who presents for a Medicare Wellness preventive visit.  Visit Complete: Virtual I connected with  Tammy Schwartz on 12/15/23 by a audio enabled telemedicine application and verified that I am speaking with the correct person using two identifiers.  Patient Location: Home  Provider Location: Office/Clinic  I discussed the limitations of evaluation and management by telemedicine. The patient expressed understanding and agreed to proceed.  Vital Signs: Because this visit was a virtual/telehealth visit, some criteria may be missing or patient reported. Any vitals not documented were not able to be obtained and vitals that have been documented are patient reported.  VideoDeclined- This patient declined Librarian, academic. Therefore the visit was completed with audio only.  AWV Questionnaire: No: Patient Medicare AWV questionnaire was not completed prior to this visit.  Cardiac Risk Factors include: advanced age (>2men, >55 women);dyslipidemia;family history of premature cardiovascular disease;hypertension     Objective:    Today's Vitals   12/15/23 1343  Weight: 90 lb (40.8 kg)  Height: 5\' 1"  (1.549 m)  PainSc: 0-No pain   Body mass index is 17.01 kg/m.     12/15/2023    1:45 PM 02/12/2023   12:33 PM 04/23/2021    2:56 PM 04/24/2020    1:57 PM 02/23/2020    5:37 PM 08/01/2019    2:32 PM 07/14/2018    2:30 PM  Advanced Directives  Does Patient Have a Medical Advance Directive? No No No No No No No  Would patient like information on creating a medical advance directive? No - Patient declined  No - Patient declined No - Patient declined  Yes (MAU/Ambulatory/Procedural Areas - Information given) No - Patient declined    Current Medications (verified) Outpatient Encounter Medications as of 12/15/2023  Medication Sig   albuterol (VENTOLIN HFA) 108 (90 Base) MCG/ACT inhaler INHALE 2 PUFFS INTO  THE LUNGS EVERY 6 HOURS AS NEEDED   amLODipine (NORVASC) 10 MG tablet TAKE 1 TABLET(10 MG) BY MOUTH DAILY   aspirin EC 81 MG tablet Take 1 tablet (81 mg total) by mouth daily. Swallow whole.   azelastine (ASTELIN) 0.1 % nasal spray Place 2 sprays into both nostrils 2 (two) times daily.   buPROPion (WELLBUTRIN XL) 150 MG 24 hr tablet TAKE 1 TABLET(150 MG) BY MOUTH DAILY   carvedilol (COREG) 3.125 MG tablet TAKE 1 TABLET(3.125 MG) BY MOUTH TWICE DAILY WITH A MEAL   cetirizine (ZYRTEC) 10 MG tablet TAKE 1 TABLET BY MOUTH DAILY.   DULoxetine (CYMBALTA) 60 MG capsule TAKE 1 CAPSULE(60 MG) BY MOUTH DAILY   hydrALAZINE (APRESOLINE) 50 MG tablet Take 1.5 tablets (75 mg total) by mouth 3 (three) times daily.   meclizine (ANTIVERT) 25 MG tablet Take 1 tablet (25 mg total) by mouth 3 (three) times daily as needed for dizziness.   mirtazapine (REMERON) 30 MG tablet TAKE 1 TABLET(30 MG) BY MOUTH AT BEDTIME   montelukast (SINGULAIR) 10 MG tablet Take 10 mg by mouth daily.   Multiple Vitamin (MULTIVITAMIN WITH MINERALS) TABS tablet Take 1 tablet by mouth daily.   pantoprazole (PROTONIX) 40 MG tablet TAKE 1 TABLET(40 MG) BY MOUTH DAILY   polyethylene glycol powder (GLYCOLAX/MIRALAX) powder Take 17 g by mouth daily.   rosuvastatin (CRESTOR) 5 MG tablet Take 1 tablet (5 mg total) by mouth daily.   tiZANidine (ZANAFLEX) 4 MG tablet TAKE 1 TABLET(4 MG) BY MOUTH AT BEDTIME AS NEEDED FOR MUSCLE SPASMS   triamcinolone cream (  KENALOG) 0.1 % APPLY 1 APPLICATION TOPICALLY 2 (TWO) TIMES DAILY.   valsartan (DIOVAN) 320 MG tablet Take 1 tablet (320 mg total) by mouth daily.   No facility-administered encounter medications on file as of 12/15/2023.    Allergies (verified) Patient has no known allergies.   History: Past Medical History:  Diagnosis Date   Chronic back pain    HNp,spondylosis,radiculopathy   COPD (chronic obstructive pulmonary disease) (HCC)    GERD (gastroesophageal reflux disease)    Hyperlipidemia     was on choloesterol meds last yr-samples given in office but not needed since   Hypertension    takes Exforge daily   Joint pain    Shortness of breath    takes Singulair daily;with exertion   Sickle cell trait (HCC)    Spondylolysis    Tobacco use    Past Surgical History:  Procedure Laterality Date   BACK SURGERY     BREAST EXCISIONAL BIOPSY Left 2007   benign   BREAST SURGERY Left    lumpectomy   COLONOSCOPY     DILATION AND CURETTAGE OF UTERUS     LUMBAR LAMINECTOMY/DECOMPRESSION MICRODISCECTOMY  06/04/2012   Procedure: LUMBAR LAMINECTOMY/DECOMPRESSION MICRODISCECTOMY 1 LEVEL;  Surgeon: Carmela Hurt, MD;  [facility]: [facility];  Service: Neurosurgery;  Laterality: Left;  LEFT Lumbar five-sacral one diskectomy   LUMBAR LAMINECTOMY/DECOMPRESSION MICRODISCECTOMY Right 06/16/2013   Procedure: RIGHT Lumbar Five-Sacral One Microdiskectomy;  Surgeon: Carmela Hurt, MD;  [facility]: [facility];  Service: Neurosurgery;  Laterality: Right;  RIGHT Lumbar Five-Sacral One Microdiskectomy   LUMBAR LAMINECTOMY/DECOMPRESSION MICRODISCECTOMY Right 01/20/2014   Procedure: LUMBAR FOUR TO LUMBAR FIVE LUMBAR LAMINECTOMY/DECOMPRESSION MICRODISCECTOMY 1 LEVEL;  Surgeon: Carmela Hurt, MD;  [facility]: [facility];  Service: Neurosurgery;  Laterality: Right;  Right L45 laminectomy and foramintomy   Family History  Problem Relation Age of Onset   Stomach cancer Mother    Cancer - Other Mother    CAD Father        Died age 25   Hypertension Sister    Social History   Socioeconomic History   Marital status: Married    Spouse name: Not on file   Number of children: Not on file   Years of education: 12   Highest education level: High school graduate  Occupational History   Not on file  Tobacco Use   Smoking status: Former    Current packs/day: 0.25    Average packs/day: 0.3 packs/day for 20.0 years (5.0 ttl pk-yrs)    Types: Cigarettes   Smokeless tobacco: Never  Vaping Use   Vaping  status: Never Used  Substance and Sexual Activity   Alcohol use: No   Drug use: No   Sexual activity: Yes    Birth control/protection: Post-menopausal  Other Topics Concern   Not on file  Social History Narrative   Lives with husband and son.    Social Drivers of Corporate investment banker Strain: Low Risk  (12/15/2023)   Overall Financial Resource Strain (CARDIA)    Difficulty of Paying Living Expenses: Not hard at all  Food Insecurity: No Food Insecurity (12/15/2023)   Hunger Vital Sign    Worried About Running Out of Food in the Last Year: Never true    Ran Out of Food in the Last Year: Never true  Transportation Needs: No Transportation Needs (12/15/2023)   PRAPARE - Administrator, Civil Service (Medical): No    Lack of Transportation (Non-Medical): No  Physical Activity: Sufficiently Active (12/15/2023)   Exercise Vital Sign    Days of Exercise per Week: 5 days    Minutes of Exercise per Session: 30 min  Stress: No Stress Concern Present (12/15/2023)   [employer] of Occupational Health - Occupational Stress Questionnaire    Feeling of Stress : Not at all  Social Connections: Socially Integrated (12/15/2023)   Social Connection and Isolation Panel [NHANES]    Frequency of Communication with Friends and Family: More than three times a week    Frequency of Social Gatherings with Friends and Family: More than three times a week    Attends Religious Services: More than 4 times per year    Active Member of [employer] or Organizations: Yes    Attends Engineer, structural: More than 4 times per year    Marital Status: Married    Tobacco Counseling Counseling given: Not Answered    Clinical Intake:  Pre-visit preparation completed: Yes  Pain : No/denies pain Pain Score: 0-No pain     BMI - recorded: 17.01 Nutritional Status: BMI <19  Underweight Nutritional Risks: None Diabetes: No  How often do you need to have someone help you when you read  instructions, pamphlets, or other written materials from your doctor or pharmacy?: 1 - Never What is the last grade level you completed in school?: HSG  Interpreter Needed?: No  Information entered by :: Ko Bardon N. Merideth Bosque, LPN.   Activities of Daily Living     12/15/2023    1:47 PM 02/12/2023   12:35 PM  In your present state of health, do you have any difficulty performing the following activities:  Hearing? 0 0  Vision? 0 0  Difficulty concentrating or making decisions? 0 0  Walking or climbing stairs? 0 0  Dressing or bathing? 0 0  Doing errands, shopping? 0 0  Preparing Food and eating ? N N  Using the Toilet? N N  In the past six months, have you accidently leaked urine? N N  Do you have problems with loss of bowel control? N N  Managing your Medications? N N  Managing your Finances? N N  Housekeeping or managing your Housekeeping? N N    Patient Care Team: Hoy Register, MD as PCP - General (Family Medicine) Rollene Rotunda, MD as PCP - Cardiology (Cardiology) [facility] as Consulting Physician (Optometry)  Indicate any recent Medical Services you may have received from other than [facility] providers in the past year (date may be approximate).     Assessment:   This is a routine wellness examination for Tammy Schwartz.  Hearing/Vision screen Hearing Screening - Comments:: Denies hearing difficulties. No hearing aids.  Vision Screening - Comments:: Wears rx glasses - up to date with routine eye exams with [facility]    Goals Addressed             This Visit's Progress    Client understands the importance of follow-up with providers by attending scheduled visits         Depression Screen     12/15/2023    1:47 PM 04/22/2023    2:28 PM 02/12/2023   12:35 PM 09/29/2022    3:50 PM 03/14/2022    2:59 PM 12/02/2021   11:43 AM 04/29/2021    1:59 PM  PHQ 2/9 Scores  PHQ - 2 Score 0 0 0 0 0 0 0  PHQ- 9 Score 0 0  2 0 0 4    Fall Risk  12/15/2023    1:46 PM 04/22/2023     2:28 PM 02/12/2023   12:34 PM 09/29/2022    3:41 PM 03/14/2022    2:56 PM  Fall Risk   Falls in the past year? 0 0 1 1 0  Comment   getting out the car, missed a step    Number falls in past yr: 0 0 1 0 0  Injury with Fall? 0 0 0 1 0  Risk for fall due to : No Fall Risks No Fall Risks Medication side effect    Follow up Falls prevention discussed;Falls evaluation completed  Falls prevention discussed;Education provided;Falls evaluation completed      MEDICARE RISK AT HOME:  Medicare Risk at Home Any stairs in or around the home?: No If so, are there any without handrails?: No Home free of loose throw rugs in walkways, pet beds, electrical cords, etc?: Yes Adequate lighting in your home to reduce risk of falls?: Yes Life alert?: No Use of a cane, walker or w/c?: No Grab bars in the bathroom?: No Shower chair or bench in shower?: No Elevated toilet seat or a handicapped toilet?: No  TIMED UP AND GO:  Was the test performed?  No  Cognitive Function: 6CIT completed    12/15/2023    1:46 PM  MMSE - Mini Mental State Exam  Not completed: Unable to complete        12/15/2023    1:46 PM 02/12/2023   12:36 PM  6CIT Screen  What Year? 0 points 0 points  What month? 0 points 0 points  What time? 0 points 0 points  Count back from 20 0 points 0 points  Months in reverse 0 points 0 points  Repeat phrase 0 points 0 points  Total Score 0 points 0 points    Immunizations Immunization History  Administered Date(s) Administered   Fluad Quad(high Dose 65+) 09/29/2022   Influenza, Seasonal, Injecte, Preservative Fre 07/06/2023   Influenza,inj,Quad PF,6+ Mos 07/31/2017, 07/08/2018, 06/09/2019, 05/30/2020, 08/16/2021   PFIZER Comirnaty(Gray Top)Covid-19 Tri-Sucrose Vaccine 12/04/2020   PFIZER(Purple Top)SARS-COV-2 Vaccination 01/23/2020, 02/13/2020   PNEUMOCOCCAL CONJUGATE-20 03/14/2022   Pfizer(Comirnaty)Fall Seasonal Vaccine 12 years and older 07/06/2023   Pneumococcal Conjugate-13  12/05/2020   Tdap 07/31/2017    Screening Tests Health Maintenance  Topic Date Due   Zoster Vaccines- Shingrix (1 of 2) Never done   Lung Cancer Screening  08/22/2022   COVID-19 Vaccine (6 - 2024-25 season) 08/31/2023   Medicare Annual Wellness (AWV)  12/14/2024   Colonoscopy  12/26/2024   MAMMOGRAM  01/04/2025   DTaP/Tdap/Td (2 - Td or Tdap) 08/01/2027   Pneumonia Vaccine 109+ Years old  Completed   INFLUENZA VACCINE  Completed   DEXA SCAN  Completed   Hepatitis C Screening  Completed   HPV VACCINES  Aged Out    Health Maintenance  Health Maintenance Due  Topic Date Due   Zoster Vaccines- Shingrix (1 of 2) Never done   Lung Cancer Screening  08/22/2022   COVID-19 Vaccine (6 - 2024-25 season) 08/31/2023   Health Maintenance Items Addressed: Lung Cancer Screening ordered  Additional Screening:  Vision Screening: Recommended annual ophthalmology exams for early detection of glaucoma and other disorders of the eye.  Dental Screening: Recommended annual dental exams for proper oral hygiene  Community Resource Referral / Chronic Care Management: CRR required this visit?  No   CCM required this visit?  No     Plan:     I have personally reviewed  and noted the following in the patient's chart:   Medical and social history Use of alcohol, tobacco or illicit drugs  Current medications and supplements including opioid prescriptions. Patient is not currently taking opioid prescriptions. Functional ability and status Nutritional status Physical activity Advanced directives List of other physicians Hospitalizations, surgeries, and ER visits in previous 12 months Vitals Screenings to include cognitive, depression, and falls Referrals and appointments  In addition, I have reviewed and discussed with patient certain preventive protocols, quality metrics, and best practice recommendations. A written personalized care plan for preventive services as well as general  preventive health recommendations were provided to patient.     Mickeal Needy, LPN   10/18/1094   After Visit Summary: (MyChart) Due to this being a telephonic visit, the after visit summary with patients personalized plan was offered to patient via MyChart   Notes: Please refer to Routing Comments.

## 2023-12-16 ENCOUNTER — Encounter: Payer: Self-pay | Admitting: Family Medicine

## 2023-12-16 ENCOUNTER — Ambulatory Visit: Payer: Medicare Other | Attending: Family Medicine | Admitting: Family Medicine

## 2023-12-16 VITALS — BP 153/88 | HR 66 | Ht 61.0 in | Wt 82.4 lb

## 2023-12-16 DIAGNOSIS — K219 Gastro-esophageal reflux disease without esophagitis: Secondary | ICD-10-CM | POA: Diagnosis not present

## 2023-12-16 DIAGNOSIS — M549 Dorsalgia, unspecified: Secondary | ICD-10-CM

## 2023-12-16 DIAGNOSIS — Z23 Encounter for immunization: Secondary | ICD-10-CM | POA: Diagnosis not present

## 2023-12-16 DIAGNOSIS — E785 Hyperlipidemia, unspecified: Secondary | ICD-10-CM | POA: Diagnosis not present

## 2023-12-16 DIAGNOSIS — M5416 Radiculopathy, lumbar region: Secondary | ICD-10-CM

## 2023-12-16 DIAGNOSIS — I10 Essential (primary) hypertension: Secondary | ICD-10-CM | POA: Diagnosis not present

## 2023-12-16 DIAGNOSIS — F17210 Nicotine dependence, cigarettes, uncomplicated: Secondary | ICD-10-CM

## 2023-12-16 DIAGNOSIS — J302 Other seasonal allergic rhinitis: Secondary | ICD-10-CM

## 2023-12-16 DIAGNOSIS — J449 Chronic obstructive pulmonary disease, unspecified: Secondary | ICD-10-CM | POA: Diagnosis not present

## 2023-12-16 DIAGNOSIS — G4709 Other insomnia: Secondary | ICD-10-CM | POA: Diagnosis not present

## 2023-12-16 DIAGNOSIS — L299 Pruritus, unspecified: Secondary | ICD-10-CM

## 2023-12-16 DIAGNOSIS — M791 Myalgia, unspecified site: Secondary | ICD-10-CM

## 2023-12-16 MED ORDER — HYDRALAZINE HCL 50 MG PO TABS: 75.0000 mg | ORAL_TABLET | Freq: Three times a day (TID) | ORAL | 3 refills | Status: DC

## 2023-12-16 MED ORDER — TIZANIDINE HCL 4 MG PO TABS: 4.0000 mg | ORAL_TABLET | Freq: Every evening | ORAL | 1 refills | Status: DC | PRN

## 2023-12-16 MED ORDER — CETIRIZINE HCL 10 MG PO TABS: ORAL_TABLET | Freq: Every day | ORAL | 1 refills | Status: DC

## 2023-12-16 MED ORDER — PANTOPRAZOLE SODIUM 40 MG PO TBEC: 40.0000 mg | DELAYED_RELEASE_TABLET | Freq: Every day | ORAL | 1 refills | Status: DC

## 2023-12-16 MED ORDER — BUPROPION HCL ER (XL) 150 MG PO TB24: 150.0000 mg | ORAL_TABLET | Freq: Every day | ORAL | 1 refills | Status: DC

## 2023-12-16 MED ORDER — AMLODIPINE BESYLATE 10 MG PO TABS
ORAL_TABLET | ORAL | 3 refills | Status: DC
Start: 2023-12-16 — End: 2024-02-04

## 2023-12-16 MED ORDER — DULOXETINE HCL 60 MG PO CPEP: 60.0000 mg | ORAL_CAPSULE | Freq: Every day | ORAL | 1 refills | Status: DC

## 2023-12-16 MED ORDER — CARVEDILOL 3.125 MG PO TABS: 3.1250 mg | ORAL_TABLET | Freq: Two times a day (BID) | ORAL | 1 refills | Status: DC

## 2023-12-16 MED ORDER — MIRTAZAPINE 30 MG PO TABS: ORAL_TABLET | ORAL | 1 refills | Status: DC

## 2023-12-16 NOTE — Progress Notes (Signed)
 "  Subjective:  Patient ID: Tammy Schwartz, female    DOB: 1956-09-29  Age: 68 y.o. MRN: [id number]  CC: Medical Management of Chronic Issues     Discussed the use of AI scribe software for clinical note transcription with the patient, who gave verbal consent to proceed.  History of Present Illness The patient, with a history of hypertension, tobacco abuse (greater than 20-pack-year history), COPD, CAD (LAD calcifications on CT scan) lumbar spondylosis status post back surgery presents with high blood pressure. She attributes this to recent stress related to the death of her brother and the subsequent difficulty in locating her sister for the funeral. She has been taking her prescribed medications, including Crestor  for cholesterol and pantoprazole  for acid reflux, which she reports is well controlled.  The patient has recently quit smoking, a significant accomplishment that she is proud of. She is still taking Wellbutrin  to assist with this. She occasionally uses an inhaler for her COPD, particularly when she has a cough. She also has a back brace for when she experiences back pain, and takes tizanidine , a muscle relaxant, as needed. She has been prescribed duloxetine  for back pain and anxiety, but it is unclear if she is currently taking this.  She also takes Remeron  to improve her appetite and help her gain weight, which she takes at night. She has been prescribed a meclizine  for vertigo, but reports she has not needed this recently.    Past Medical History:  Diagnosis Date   Chronic back pain    HNp,spondylosis,radiculopathy   COPD (chronic obstructive pulmonary disease) (HCC)    GERD (gastroesophageal reflux disease)    Hyperlipidemia    was on choloesterol meds last yr-samples given in office but not needed since   Hypertension    takes Exforge  daily   Joint pain    Shortness of breath    takes Singulair  daily;with exertion   Sickle cell trait (HCC)    Spondylolysis     Tobacco use     Past Surgical History:  Procedure Laterality Date   BACK SURGERY     BREAST EXCISIONAL BIOPSY Left 2007   benign   BREAST SURGERY Left    lumpectomy   COLONOSCOPY     DILATION AND CURETTAGE OF UTERUS     LUMBAR LAMINECTOMY/DECOMPRESSION MICRODISCECTOMY  06/04/2012   Procedure: LUMBAR LAMINECTOMY/DECOMPRESSION MICRODISCECTOMY 1 LEVEL;  Surgeon: Rockey LITTIE Peru, MD;  Location: [facility];  Service: Neurosurgery;  Laterality: Left;  LEFT Lumbar five-sacral one diskectomy   LUMBAR LAMINECTOMY/DECOMPRESSION MICRODISCECTOMY Right 06/16/2013   Procedure: RIGHT Lumbar Five-Sacral One Microdiskectomy;  Surgeon: Rockey LITTIE Peru, MD;  Location: [facility];  Service: Neurosurgery;  Laterality: Right;  RIGHT Lumbar Five-Sacral One Microdiskectomy   LUMBAR LAMINECTOMY/DECOMPRESSION MICRODISCECTOMY Right 01/20/2014   Procedure: LUMBAR FOUR TO LUMBAR FIVE LUMBAR LAMINECTOMY/DECOMPRESSION MICRODISCECTOMY 1 LEVEL;  Surgeon: Rockey LITTIE Peru, MD;  Location: [facility];  Service: Neurosurgery;  Laterality: Right;  Right L45 laminectomy and foramintomy    Family History  Problem Relation Age of Onset   Stomach cancer Mother    Cancer - Other Mother    CAD Father        Died age 47   Hypertension Sister     Social History   Socioeconomic History   Marital status: Married    Spouse name: Not on file   Number of children: Not on file   Years of education: 12   Highest education level: High school graduate  Occupational  History   Not on file  Tobacco Use   Smoking status: Former    Current packs/day: 0.25    Average packs/day: 0.3 packs/day for 20.0 years (5.0 ttl pk-yrs)    Types: Cigarettes   Smokeless tobacco: Never  Vaping Use   Vaping status: Never Used  Substance and Sexual Activity   Alcohol use: No   Drug use: No   Sexual activity: Yes    Birth control/protection: Post-menopausal  Other Topics Concern   Not on file  Social History Narrative   Lives with husband  and son.    Social Drivers of Corporate Investment Banker Strain: Low Risk  (12/15/2023)   Overall Financial Resource Strain (CARDIA)    Difficulty of Paying Living Expenses: Not hard at all  Food Insecurity: No Food Insecurity (12/15/2023)   Hunger Vital Sign    Worried About Running Out of Food in the Last Year: Never true    Ran Out of Food in the Last Year: Never true  Transportation Needs: No Transportation Needs (12/15/2023)   PRAPARE - Administrator, Civil Service (Medical): No    Lack of Transportation (Non-Medical): No  Physical Activity: Sufficiently Active (12/15/2023)   Exercise Vital Sign    Days of Exercise per Week: 5 days    Minutes of Exercise per Session: 30 min  Stress: No Stress Concern Present (12/15/2023)   [employer] of Occupational Health - Occupational Stress Questionnaire    Feeling of Stress : Not at all  Social Connections: Socially Integrated (12/15/2023)   Social Connection and Isolation Panel [NHANES]    Frequency of Communication with Friends and Family: More than three times a week    Frequency of Social Gatherings with Friends and Family: More than three times a week    Attends Religious Services: More than 4 times per year    Active Member of [employer] or Organizations: Yes    Attends Engineer, Structural: More than 4 times per year    Marital Status: Married    No Known Allergies  Outpatient Medications Prior to Visit  Medication Sig Dispense Refill   albuterol  (VENTOLIN  HFA) 108 (90 Base) MCG/ACT inhaler INHALE 2 PUFFS INTO THE LUNGS EVERY 6 HOURS AS NEEDED 17 g 2   aspirin  EC 81 MG tablet Take 1 tablet (81 mg total) by mouth daily. Swallow whole. 100 tablet 3   azelastine  (ASTELIN ) 0.1 % nasal spray Place 2 sprays into both nostrils 2 (two) times daily. 30 mL 0   meclizine  (ANTIVERT ) 25 MG tablet Take 1 tablet (25 mg total) by mouth 3 (three) times daily as needed for dizziness. 60 tablet 1   montelukast  (SINGULAIR ) 10 MG  tablet Take 10 mg by mouth daily.     Multiple Vitamin (MULTIVITAMIN WITH MINERALS) TABS tablet Take 1 tablet by mouth daily. 30 tablet 3   polyethylene glycol powder (GLYCOLAX /MIRALAX ) powder Take 17 g by mouth daily. 3350 g 1   rosuvastatin  (CRESTOR ) 5 MG tablet Take 1 tablet (5 mg total) by mouth daily. 90 tablet 3   triamcinolone  cream (KENALOG ) 0.1 % APPLY 1 APPLICATION TOPICALLY 2 (TWO) TIMES DAILY. 80 g 0   valsartan  (DIOVAN ) 320 MG tablet Take 1 tablet (320 mg total) by mouth daily. 90 tablet 3   amLODipine  (NORVASC ) 10 MG tablet TAKE 1 TABLET(10 MG) BY MOUTH DAILY 90 tablet 3   buPROPion  (WELLBUTRIN  XL) 150 MG 24 hr tablet TAKE 1 TABLET(150 MG) BY MOUTH DAILY 90  tablet 1   carvedilol  (COREG ) 3.125 MG tablet TAKE 1 TABLET(3.125 MG) BY MOUTH TWICE DAILY WITH A MEAL 180 tablet 1   cetirizine  (ZYRTEC ) 10 MG tablet TAKE 1 TABLET BY MOUTH DAILY. 90 tablet 1   hydrALAZINE  (APRESOLINE ) 50 MG tablet Take 1.5 tablets (75 mg total) by mouth 3 (three) times daily. 405 tablet 3   mirtazapine  (REMERON ) 30 MG tablet TAKE 1 TABLET(30 MG) BY MOUTH AT BEDTIME 90 tablet 0   pantoprazole  (PROTONIX ) 40 MG tablet TAKE 1 TABLET(40 MG) BY MOUTH DAILY 90 tablet 1   tiZANidine  (ZANAFLEX ) 4 MG tablet TAKE 1 TABLET(4 MG) BY MOUTH AT BEDTIME AS NEEDED FOR MUSCLE SPASMS 30 tablet 1   DULoxetine  (CYMBALTA ) 60 MG capsule TAKE 1 CAPSULE(60 MG) BY MOUTH DAILY (Patient not taking: Reported on 12/16/2023) 90 capsule 1   No facility-administered medications prior to visit.     ROS Review of Systems  Constitutional:  Negative for activity change and appetite change.  HENT:  Negative for sinus pressure and sore throat.   Respiratory:  Negative for chest tightness, shortness of breath and wheezing.   Cardiovascular:  Negative for chest pain and palpitations.  Gastrointestinal:  Negative for abdominal distention, abdominal pain and constipation.  Genitourinary: Negative.   Musculoskeletal: Negative.    Psychiatric/Behavioral:  Negative for behavioral problems and dysphoric mood.     Objective:  BP (!) 153/88   Pulse 66   Ht 5' 1 (1.549 m)   Wt 82 lb 6.4 oz (37.4 kg)   LMP 03/13/2006   SpO2 97%   BMI 15.57 kg/m      12/16/2023    4:07 PM 12/15/2023    1:43 PM 06/23/2023    7:58 AM  BP/Weight  Systolic BP 153  871  Diastolic BP 88  74  Wt. (Lbs) 82.4 90 85.4  BMI 15.57 kg/m2 17.01 kg/m2 16.14 kg/m2      Physical Exam Constitutional:      Appearance: She is well-developed.  Cardiovascular:     Rate and Rhythm: Normal rate.     Heart sounds: Normal heart sounds. No murmur heard. Pulmonary:     Effort: Pulmonary effort is normal.     Breath sounds: Normal breath sounds. No wheezing or rales.  Chest:     Chest wall: No tenderness.  Abdominal:     General: Bowel sounds are normal. There is no distension.     Palpations: Abdomen is soft. There is no mass.     Tenderness: There is no abdominal tenderness.  Musculoskeletal:        General: Normal range of motion.     Right lower leg: No edema.     Left lower leg: No edema.  Neurological:     Mental Status: She is alert and oriented to person, place, and time.  Psychiatric:        Mood and Affect: Mood normal.        Latest Ref Rng & Units 12/16/2023    4:45 PM 06/23/2023    8:20 AM 04/22/2023    2:54 PM  CMP  Glucose 70 - 99 mg/dL 78  77  75   BUN 8 - 27 mg/dL 8  5  16    Creatinine 0.57 - 1.00 mg/dL 9.19  9.23  9.12   Sodium 134 - 144 mmol/L 145  145  140   Potassium 3.5 - 5.2 mmol/L 5.1  3.8  5.2   Chloride 96 - 106 mmol/L 106  107  104   CO2 20 - 29 mmol/L 22  23  23    Calcium  8.7 - 10.3 mg/dL 9.6  9.5  9.7     Lipid Panel     Component Value Date/Time   CHOL 160 12/02/2021 1220   TRIG 49 12/02/2021 1220   HDL 122 12/02/2021 1220   CHOLHDL 2.0 01/01/2018 1223   CHOLHDL 2.3 05/07/2016 0739   VLDL 20 05/07/2016 0739   LDLCALC 27 12/02/2021 1220    CBC    Component Value Date/Time   WBC 5.5  12/02/2021 1220   WBC 5.0 07/31/2021 1022   RBC 4.61 12/02/2021 1220   RBC 4.87 07/31/2021 1022   HGB 13.8 12/02/2021 1220   HCT 42.1 12/02/2021 1220   PLT 258 12/02/2021 1220   MCV 91 12/02/2021 1220   MCH 29.9 12/02/2021 1220   MCH 29.0 02/23/2020 1838   MCHC 32.8 12/02/2021 1220   MCHC 33.2 07/31/2021 1022   RDW 15.8 (H) 12/02/2021 1220   LYMPHSABS 2.1 12/02/2021 1220   MONOABS 0.4 07/31/2021 1022   EOSABS 0.1 12/02/2021 1220   BASOSABS 0.0 12/02/2021 1220    Lab Results  Component Value Date   HGBA1C 5.7 (H) 12/02/2021       Assessment & Plan Hypertension Elevated blood pressure likely due to stress from family issues. Previously normal in September. - Recheck blood pressure during visit reveal BP is still slightly elevated -Counseled on blood pressure goal of less than 130/80, low-sodium, DASH diet, medication compliance, 150 minutes of moderate intensity exercise per week. Discussed medication compliance, adverse effects. .  Chronic Obstructive Pulmonary Disease (COPD) Quit smoking, expected improvement in symptoms. Continues inhaler use as needed, especially in cold weather. Continues Wellbutrin  for smoking cessation. - Continue Wellbutrin  for smoking cessation. - Refill inhaler as needed.  Back Pain Intermittent back pain with occasional leg pain. Uses back brace and tizanidine  as needed. Uncertain about duloxetine  use. - Send duloxetine  prescription to pharmacy. - Ensure tizanidine  is available for use as needed.  Hyperlipidemia Continues Crestor  for cholesterol management. No issues reported.  Gastroesophageal Reflux Disease (GERD) No current issues. Continues pantoprazole  regularly.  Allergic Rhinitis Requires Claritin  for allergy management. - Refill Claritin  10 mg.  Insomnia -Controlled on remeron   Preventive Care Due for shingles vaccine. Declined flu shot. Lab tests planned for today. - Administer shingles vaccine. - Perform lab tests  today.        Meds ordered this encounter  Medications   amLODipine  (NORVASC ) 10 MG tablet    Sig: TAKE 1 TABLET(10 MG) BY MOUTH DAILY    Dispense:  90 tablet    Refill:  3   buPROPion  (WELLBUTRIN  XL) 150 MG 24 hr tablet    Sig: Take 1 tablet (150 mg total) by mouth daily.    Dispense:  90 tablet    Refill:  1   carvedilol  (COREG ) 3.125 MG tablet    Sig: Take 1 tablet (3.125 mg total) by mouth 2 (two) times daily with a meal.    Dispense:  180 tablet    Refill:  1   DULoxetine  (CYMBALTA ) 60 MG capsule    Sig: Take 1 capsule (60 mg total) by mouth daily.    Dispense:  90 capsule    Refill:  1   hydrALAZINE  (APRESOLINE ) 50 MG tablet    Sig: Take 1.5 tablets (75 mg total) by mouth 3 (three) times daily.    Dispense:  405 tablet    Refill:  3  mirtazapine  (REMERON ) 30 MG tablet    Sig: TAKE 1 TABLET(30 MG) BY MOUTH AT BEDTIME    Dispense:  90 tablet    Refill:  1   pantoprazole  (PROTONIX ) 40 MG tablet    Sig: Take 1 tablet (40 mg total) by mouth daily.    Dispense:  90 tablet    Refill:  1   tiZANidine  (ZANAFLEX ) 4 MG tablet    Sig: Take 1 tablet (4 mg total) by mouth at bedtime as needed for muscle spasms.    Dispense:  90 tablet    Refill:  1   cetirizine  (ZYRTEC ) 10 MG tablet    Sig: TAKE 1 TABLET BY MOUTH DAILY.    Dispense:  90 tablet    Refill:  1    Follow-up: Return in about 6 months (around 06/17/2024) for Chronic medical conditions.       Corrina Sabin, MD, FAAFP. [facility] [place], [place] [phone]   12/17/2023, 7:31 PM "

## 2023-12-16 NOTE — Patient Instructions (Signed)
 VISIT SUMMARY:  During today's visit, we discussed your high blood pressure, which seems to be related to recent stress. We also reviewed your ongoing management of COPD, back pain, high cholesterol, acid reflux, and allergies. You have made significant progress by quitting smoking, and we will continue to support you in this effort. We also addressed your preventive care needs, including administering the shingles vaccine and planning lab tests.  YOUR PLAN:  -HYPERTENSION: Hypertension means high blood pressure, which can be influenced by stress. We rechecked your blood pressure during the visit to monitor it.  -CHRONIC OBSTRUCTIVE PULMONARY DISEASE (COPD): COPD is a lung condition that makes it hard to breathe. Since you quit smoking, your symptoms are expected to improve. Continue using your inhaler as needed and take Wellbutrin to help with smoking cessation.  -BACK PAIN: Back pain can be managed with medications and supportive devices. We will send a prescription for duloxetine to the pharmacy and ensure you have tizanidine available for use as needed.  -HYPERLIPIDEMIA: Hyperlipidemia means high cholesterol levels in the blood. You are managing this well with Crestor, and no issues were reported.  -GASTROESOPHAGEAL REFLUX DISEASE (GERD): GERD is a condition where stomach acid frequently flows back into the tube connecting your mouth and stomach. You are managing this well with pantoprazole, and no current issues were reported.  -ALLERGIC RHINITIS: Allergic rhinitis is an allergic reaction that causes sneezing, congestion, and a runny nose. We will refill your Claritin prescription to help manage your symptoms.  -PREVENTIVE CARE: Preventive care includes measures taken to prevent diseases. We planned lab tests to monitor your health.  INSTRUCTIONS:  We have canceled the unnecessary appointment on March 10. Please ensure all your medication refills are processed. Follow up with [place] if you  have any concerns or need further assistance.  For more information, you can read your full clinical note, available in your patient portal.

## 2023-12-17 ENCOUNTER — Encounter: Payer: Self-pay | Admitting: Family Medicine

## 2023-12-17 LAB — BASIC METABOLIC PANEL
BUN/Creatinine Ratio: 10 — ABNORMAL LOW (ref 12–28)
BUN: 8 mg/dL (ref 8–27)
CO2: 22 mmol/L (ref 20–29)
Calcium: 9.6 mg/dL (ref 8.7–10.3)
Chloride: 106 mmol/L (ref 96–106)
Creatinine, Ser: 0.8 mg/dL (ref 0.57–1.00)
Glucose: 78 mg/dL (ref 70–99)
Potassium: 5.1 mmol/L (ref 3.5–5.2)
Sodium: 145 mmol/L — ABNORMAL HIGH (ref 134–144)
eGFR: 80 mL/min/{1.73_m2} (ref >59)

## 2023-12-23 ENCOUNTER — Other Ambulatory Visit: Payer: Self-pay | Admitting: Internal Medicine

## 2023-12-24 MED ORDER — HYDRALAZINE HCL 50 MG PO TABS: 75.0000 mg | ORAL_TABLET | Freq: Three times a day (TID) | ORAL | 1 refills | Status: DC

## 2024-01-01 ENCOUNTER — Other Ambulatory Visit: Payer: Self-pay | Admitting: Family Medicine

## 2024-01-01 DIAGNOSIS — J449 Chronic obstructive pulmonary disease, unspecified: Secondary | ICD-10-CM

## 2024-01-28 ENCOUNTER — Other Ambulatory Visit: Payer: Self-pay | Admitting: Internal Medicine

## 2024-02-02 ENCOUNTER — Telehealth: Payer: Self-pay | Admitting: Cardiology

## 2024-02-02 ENCOUNTER — Telehealth: Payer: Self-pay | Admitting: Family Medicine

## 2024-02-02 MED ORDER — ROSUVASTATIN CALCIUM 5 MG PO TABS: 5.0000 mg | ORAL_TABLET | Freq: Every day | ORAL | 1 refills | Status: DC

## 2024-02-02 NOTE — Telephone Encounter (Signed)
 Pt's medication was sent to pt's pharmacy as requested. Confirmation received.

## 2024-02-02 NOTE — Telephone Encounter (Signed)
*  STAT* If patient is at the pharmacy, call can be transferred to refill team.   1. Which medications need to be refilled? (please list name of each medication and dose if known) rosuvastatin  (CRESTOR ) 5 MG tablet    2. Would you like to learn more about the convenience, safety, & potential cost savings by using the [facility]?    3. Are you open to using the [facility] (Type [facility]. ).   4. Which pharmacy/location (including street and city if local pharmacy) is medication to be sent to? [id number] - [place], [place] - [address] AT [facility] OF [address]    5. Do they need a 30 day or 90 day supply? 90 day

## 2024-02-02 NOTE — Telephone Encounter (Signed)
 Not on current med list, not pended.  Copied from CRM [id number]. Topic: Clinical - Medication Refill >> Feb 02, 2024  3:11 PM Baldemar Lev wrote: Most Recent Primary Care Visit:  Provider: NEWLIN, ENOBONG  Department: [facility]  Visit Type: OFFICE VISIT  Date: 12/16/2023  Medication: Spironolactone  25 MG TAKE 1 TABLET(25 MG) BY MOUTH DAILY  Has the patient contacted their pharmacy? Yes (Agent: If no, request that the patient contact the pharmacy for the refill. If patient does not wish to contact the pharmacy document the reason why and proceed with request.) (Agent: If yes, when and what did the pharmacy advise?)  Is this the correct pharmacy for this prescription? Yes If no, delete pharmacy and type the correct one.  This is the patient's preferred pharmacy:  [id number] [place], [place] - [phone] AT [facility] OF [facility] [address] [place] [address] [place] [address] Phone: [phone] Fax: [phone]   Has the prescription been filled recently? Yes  Is the patient out of the medication? Yes  Has the patient been seen for an appointment in the last year OR does the patient have an upcoming appointment? Yes  Can we respond through MyChart? Yes  Agent: Please be advised that Rx refills may take up to 3 business days. We ask that you follow-up with your pharmacy.

## 2024-02-02 NOTE — Telephone Encounter (Signed)
 Patient was called and informed that she is not to be taking the medication it was discontinued.

## 2024-02-04 ENCOUNTER — Other Ambulatory Visit: Payer: Self-pay | Admitting: Family Medicine

## 2024-02-04 ENCOUNTER — Other Ambulatory Visit: Payer: Self-pay | Admitting: Internal Medicine

## 2024-02-04 DIAGNOSIS — J449 Chronic obstructive pulmonary disease, unspecified: Secondary | ICD-10-CM

## 2024-02-04 DIAGNOSIS — L299 Pruritus, unspecified: Secondary | ICD-10-CM

## 2024-02-04 DIAGNOSIS — F17210 Nicotine dependence, cigarettes, uncomplicated: Secondary | ICD-10-CM

## 2024-02-04 DIAGNOSIS — M791 Myalgia, unspecified site: Secondary | ICD-10-CM

## 2024-02-04 DIAGNOSIS — G4709 Other insomnia: Secondary | ICD-10-CM

## 2024-02-04 DIAGNOSIS — I10 Essential (primary) hypertension: Secondary | ICD-10-CM

## 2024-02-05 ENCOUNTER — Telehealth: Payer: Self-pay | Admitting: Family Medicine

## 2024-02-05 NOTE — Telephone Encounter (Signed)
 Copied from CRM [id number]. Topic: Clinical - Medication Refill >> Feb 05, 2024  5:26 PM Leory Rands wrote: Most Recent Primary Care Visit:  Provider: Joaquin Mulberry  Department: [facility]  Visit Type: OFFICE VISIT  Date: 12/16/2023  Medication: Multiple Vitamin (MULTIVITAMIN WITH MINERALS) TABS tablet [[id number]]  Has the patient contacted their pharmacy? Yes (Agent: If no, request that the patient contact the pharmacy for the refill. If patient does not wish to contact the pharmacy document the reason why and proceed with request.) (Agent: If yes, when and what did the pharmacy advise?)  Is this the correct pharmacy for this prescription? No If no, delete pharmacy and type the correct one.  This is the patient's preferred pharmacy:    [facility], [place] - [address] [place] [address] Phone: [phone] Fax: [phone]   Has the prescription been filled recently? Yes  Is the patient out of the medication? Yes  Has the patient been seen for an appointment in the last year OR does the patient have an upcoming appointment? Yes  Can we respond through MyChart? Yes  Agent: Please be advised that Rx refills may take up to 3 business days. We ask that you follow-up with your pharmacy.

## 2024-02-08 ENCOUNTER — Other Ambulatory Visit: Payer: Self-pay

## 2024-02-08 MED ORDER — VALSARTAN 320 MG PO TABS: 320.0000 mg | ORAL_TABLET | Freq: Every day | ORAL | 1 refills | Status: AC

## 2024-02-08 MED ORDER — ADULT MULTIVITAMIN W/MINERALS CH: 1.0000 | ORAL_TABLET | Freq: Every day | ORAL | 3 refills | Status: AC

## 2024-02-08 MED ORDER — ROSUVASTATIN CALCIUM 5 MG PO TABS: 5.0000 mg | ORAL_TABLET | Freq: Every day | ORAL | 1 refills | Status: DC

## 2024-02-08 NOTE — Telephone Encounter (Signed)
 Medication has been refilled.

## 2024-04-11 ENCOUNTER — Other Ambulatory Visit (HOSPITAL_COMMUNITY): Payer: Self-pay

## 2024-04-25 ENCOUNTER — Other Ambulatory Visit: Payer: Self-pay | Admitting: Family Medicine

## 2024-04-25 DIAGNOSIS — Z1231 Encounter for screening mammogram for malignant neoplasm of breast: Secondary | ICD-10-CM

## 2024-05-14 DIAGNOSIS — S60361A Insect bite (nonvenomous) of right thumb, initial encounter: Secondary | ICD-10-CM | POA: Diagnosis not present

## 2024-05-30 NOTE — Progress Notes (Unsigned)
 Cardiology Office Note   Date:  05/31/2024   ID:  Tammy Schwartz, DOB 1956/04/10, MRN [id number]  PCP:  Delbert Clam, MD  Cardiologist:   Vina Gull, MD   Patient prsents for follow up of CAD    History of Present Illness: Tammy Schwartz is a 68 y.o. female with a hx of CAD,  HTN, COPD  Pt also has a hx of wt loss (initial 120 lb; seen by GI)  She also has a hx of PUD  Nov 2022  CCTA shows Ca score 180 (89th percentile) Mld dz mid LAD   , no flow limiting lesions   2024   She has had problems with hypokalemia and hyperkalemia  on meds in past  (tried hydrochlorothiazide  and spironolactone  in past)   I saw the pt in Sept 2024    The pt denies CP   Breathing good   BP at home usually 118 to 120/       Current Meds  Medication Sig   albuterol  (VENTOLIN  HFA) 108 (90 Base) MCG/ACT inhaler INHALE 2 PUFFS INTO THE LUNGS EVERY 6 HOURS AS NEEDED *NEW PRESCRIPTION REQUEST*   amLODipine  (NORVASC ) 10 MG tablet TAKE 1 TABLET BY MOUTH EVERY DAY *NEW PRESCRIPTION REQUEST*   aspirin  EC 81 MG tablet Take 1 tablet (81 mg total) by mouth daily. Swallow whole.   Azelastine  HCl 137 MCG/SPRAY SOLN USE 1 SPRAY INTO EACH NOSTRIL TWICE DAILY AS NEEDED *NEW PRESCRIPTION REQUEST*   buPROPion  (WELLBUTRIN  XL) 150 MG 24 hr tablet TAKE 1 TABLET BY MOUTH EVERY DAY *NEW PRESCRIPTION REQUEST*   carvedilol  (COREG ) 3.125 MG tablet TAKE ONE (1) TABLET BY MOUTH TWICE DAILY *NEW PRESCRIPTION REQUEST*   cetirizine  (ALLERGY RELIEF CETIRIZINE ) 10 MG tablet TAKE 1 TABLET BY MOUTH EVERY DAY *NEW PRESCRIPTION REQUEST*   DULoxetine  (CYMBALTA ) 60 MG capsule Take 1 capsule (60 mg total) by mouth daily.   hydrALAZINE  (APRESOLINE ) 50 MG tablet TAKE 1 & 1/2 TABLETS BY MOUTH THREE TIMES DAILY *NEW PRESCRIPTION REQUEST*   meclizine  (ANTIVERT ) 25 MG tablet Take 1 tablet (25 mg total) by mouth 3 (three) times daily as needed for dizziness.   mirtazapine  (REMERON ) 30 MG tablet TAKE 1 TABLET BY MOUTH EVERY  NIGHT AT BEDTIME *NEW PRESCRIPTION REQUEST*   montelukast  (SINGULAIR ) 10 MG tablet Take 10 mg by mouth daily.   Multiple Vitamin (MULTIVITAMIN WITH MINERALS) TABS tablet Take 1 tablet by mouth daily.   pantoprazole  (PROTONIX ) 40 MG tablet TAKE 1 TABLET BY MOUTH EVERY DAY *NEW PRESCRIPTION REQUEST*   polyethylene glycol powder (GLYCOLAX /MIRALAX ) powder Take 17 g by mouth daily.   rosuvastatin  (CRESTOR ) 5 MG tablet Take 1 tablet (5 mg total) by mouth daily.   tiZANidine  (ZANAFLEX ) 4 MG tablet TAKE 1 TABLET BY MOUTH EVERY NIGHT AT BEDTIME *NEW PRESCRIPTION REQUEST*   triamcinolone  cream (KENALOG ) 0.1 % APPLY 1 APPLICATION TOPICALLY 2 (TWO) TIMES DAILY.   valsartan  (DIOVAN ) 320 MG tablet Take 1 tablet (320 mg total) by mouth daily.     Allergies:   Patient has no known allergies.   Past Medical History:  Diagnosis Date   Chronic back pain    HNp,spondylosis,radiculopathy   COPD (chronic obstructive pulmonary disease) (HCC)    GERD (gastroesophageal reflux disease)    Hyperlipidemia    was on choloesterol meds last yr-samples given in office but not needed since   Hypertension    takes Exforge  daily   Joint pain    Shortness of breath  takes Singulair  daily;with exertion   Sickle cell trait (HCC)    Spondylolysis    Tobacco use     Past Surgical History:  Procedure Laterality Date   BACK SURGERY     BREAST EXCISIONAL BIOPSY Left 2007   benign   BREAST SURGERY Left    lumpectomy   COLONOSCOPY     DILATION AND CURETTAGE OF UTERUS     LUMBAR LAMINECTOMY/DECOMPRESSION MICRODISCECTOMY  06/04/2012   Procedure: LUMBAR LAMINECTOMY/DECOMPRESSION MICRODISCECTOMY 1 LEVEL;  Surgeon: Rockey LITTIE Peru, MD;  Location: [facility];  Service: Neurosurgery;  Laterality: Left;  LEFT Lumbar five-sacral one diskectomy   LUMBAR LAMINECTOMY/DECOMPRESSION MICRODISCECTOMY Right 06/16/2013   Procedure: RIGHT Lumbar Five-Sacral One Microdiskectomy;  Surgeon: Rockey LITTIE Peru, MD;  Location: [facility];   Service: Neurosurgery;  Laterality: Right;  RIGHT Lumbar Five-Sacral One Microdiskectomy   LUMBAR LAMINECTOMY/DECOMPRESSION MICRODISCECTOMY Right 01/20/2014   Procedure: LUMBAR FOUR TO LUMBAR FIVE LUMBAR LAMINECTOMY/DECOMPRESSION MICRODISCECTOMY 1 LEVEL;  Surgeon: Rockey LITTIE Peru, MD;  Location: [facility];  Service: Neurosurgery;  Laterality: Right;  Right L45 laminectomy and foramintomy     Social History:  The patient  reports that she has quit smoking. Her smoking use included cigarettes. She has a 5 pack-year smoking history. She has never used smokeless tobacco. She reports that she does not drink alcohol and does not use drugs.   Family History:  The patient's family history includes CAD in her father; Cancer - Other in her mother; Hypertension in her sister; Stomach cancer in her mother.    ROS:  Please see the history of present illness. All other systems are reviewed and  Negative to the above problem except as noted.    PHYSICAL EXAM: VS:  BP (!) 158/74   Pulse 64   Ht 5' 1 (1.549 m)   Wt 78 lb 9.6 oz (35.7 kg)   LMP 03/13/2006   SpO2 98%   BMI 14.85 kg/m   GEN: Very thin 68  yo  in no acute distress  HEENT: normal  Neck: no JVD, no carotid bruits Cardiac: RRR; no murmurs  Respiratory: Decreased airflow  No rales or wheezes  GI: soft, nontender,  No hepatomegaly  Ext  No LE edema    EKG:  EKG shows NSR 64 bpm   ANteroseptal MI   CT scan   08/2021  Coronary Arteries:  Normal coronary origin.  Right dominance.   RCA is a large dominant artery that gives rise to PDA and PLA. There is mixed plaque in the proximal RCA causing 0-24% stenosis.   Left main is a large artery that gives rise to LAD and LCX arteries. There is calcified plaque in the left main causing 0-24% stenosis   LAD is a large vessel. There is calcified plaque in the proximal LAD causing 0-24% stenosis. There is calcified plaque in the proximal to mid LAD causing 25-49% stenosis   LCX is a  non-dominant artery that gives rise to one large OM1 branch. There is calcified plaque in the mid LCX causing 0-24% stenosis     IMPRESSION: 1. Coronary calcium  score of 180. This was 89th percentile for age and sex matched control.   2. Normal coronary origin with right dominance.   3. Nonobstructive CAD. Most significant lesion is calcified plaque in the proximal to mid LAD causing mild (25-49%) stenosis   Lipid Panel    Component Value Date/Time   CHOL 160 12/02/2021 1220   TRIG 49 12/02/2021 1220  HDL 122 12/02/2021 1220   CHOLHDL 2.0 01/01/2018 1223   CHOLHDL 2.3 05/07/2016 0739   VLDL 20 05/07/2016 0739   LDLCALC 27 12/02/2021 1220      Wt Readings from Last 3 Encounters:  05/31/24 78 lb 9.6 oz (35.7 kg)  12/16/23 82 lb 6.4 oz (37.4 kg)  12/15/23 90 lb (40.8 kg)      ASSESSMENT AND PLAN:  1  CAD CCTA showed mild CAD   PT without symptoms   Follow   2  HTN  BP is better at home   Follow   Continue current meds      3  Lipdis  LDL 24  HDL 113  Trig 152    Watch diet  Continue Crestor         Current medicines are reviewed at length with the patient today.  The patient does not have concerns regarding medicines.  Signed, Vina Gull, MD  05/31/2024 11:43 AM    [facility] [address] [place], [place], [place]  [address] Phone: [phone]; Fax: [phone]

## 2024-05-31 ENCOUNTER — Ambulatory Visit: Attending: Internal Medicine | Admitting: Internal Medicine

## 2024-05-31 ENCOUNTER — Encounter: Payer: Self-pay | Admitting: Internal Medicine

## 2024-05-31 VITALS — BP 158/74 | HR 64 | Ht 61.0 in | Wt 78.6 lb

## 2024-05-31 DIAGNOSIS — I10 Essential (primary) hypertension: Secondary | ICD-10-CM

## 2024-05-31 NOTE — Patient Instructions (Signed)
 Medication Instructions:  Your physician recommends that you continue on your current medications as directed. Please refer to the Current Medication list given to you today.  *If you need a refill on your cardiac medications before your next appointment, please call your pharmacy*  Follow-Up: At [facility], you and your health needs are our priority.  As part of our continuing mission to provide you with exceptional heart care, our providers are all part of one team.  This team includes your primary Cardiologist (physician) and Advanced Practice Providers or APPs (Physician Assistants and Nurse Practitioners) who all work together to provide you with the care you need, when you need it.  Your next appointment:   1 year(s)  Provider:   Vina Gull, MD   We recommend signing up for the patient portal called MyChart.  Sign up information is provided on this After Visit Summary.  MyChart is used to connect with patients for Virtual Visits (Telemedicine).  Patients are able to view lab/test results, encounter notes, upcoming appointments, etc.  Non-urgent messages can be sent to your provider as well.    To learn more about what you can do with MyChart, go to [email or web address].

## 2024-06-01 ENCOUNTER — Other Ambulatory Visit: Payer: Self-pay | Admitting: Family Medicine

## 2024-06-01 ENCOUNTER — Ambulatory Visit
Admission: RE | Admit: 2024-06-01 | Discharge: 2024-06-01 | Disposition: A | Discharge status: Home / Self Care | Source: Ambulatory Visit | Attending: Family Medicine | Admitting: Family Medicine

## 2024-06-01 DIAGNOSIS — Z1231 Encounter for screening mammogram for malignant neoplasm of breast: Secondary | ICD-10-CM | POA: Diagnosis not present

## 2024-06-03 ENCOUNTER — Ambulatory Visit: Payer: Self-pay | Admitting: Family Medicine

## 2024-06-17 ENCOUNTER — Other Ambulatory Visit: Payer: Self-pay | Admitting: Family Medicine

## 2024-06-17 DIAGNOSIS — I10 Essential (primary) hypertension: Secondary | ICD-10-CM

## 2024-06-17 NOTE — Telephone Encounter (Signed)
 Change of pharmacy Requested Prescriptions  Pending Prescriptions Disp Refills   carvedilol  (COREG ) 3.125 MG tablet [Pharmacy Med Name: CARVEDILOL  3.125MG  TABLETS] 180 tablet 0    Sig: TAKE 1 TABLET(3.125 MG) BY MOUTH TWICE DAILY WITH A MEAL     Cardiovascular: Beta Blockers 3 Failed - 06/17/2024 12:13 PM      Failed - AST in normal range and within 360 days    AST  Date Value Ref Range Status  12/02/2021 37 0 - 40 IU/L Final         Failed - ALT in normal range and within 360 days    ALT  Date Value Ref Range Status  12/02/2021 34 (H) 0 - 32 IU/L Final         Failed - Last BP in normal range    BP Readings from Last 1 Encounters:  05/31/24 (!) 158/74         Failed - Valid encounter within last 6 months    Recent Outpatient Visits           6 months ago Chronic obstructive pulmonary disease, unspecified COPD type (HCC)   [facility] [place]. [facility] Delbert Clam, MD   1 year ago Moderate protein-calorie malnutrition [facility])   [facility] [place]. [facility] Delbert Clam, MD   1 year ago Myalgia   [facility] [place]. [facility] Delbert Clam, MD   2 years ago Moderate protein-calorie malnutrition [facility])   [facility] [place]. [facility] Delbert Clam, MD   2 years ago Screening for diabetes mellitus   [facility] Of [place]. [facility] Delbert Clam, MD       Future Appointments             In 5 days Delbert Clam, MD [facility] - [facility] Of [place]. [facility] [address]            Passed - Cr in normal range and within 360 days    Creatinine, Ser  Date Value Ref Range Status  12/16/2023 0.80 0.57 - 1.00 mg/dL Final         Passed - Last Heart Rate in normal range    Pulse Readings from Last 1  Encounters:  05/31/24 64

## 2024-06-20 ENCOUNTER — Telehealth: Payer: Self-pay

## 2024-06-20 NOTE — Telephone Encounter (Signed)
 Copied from CRM [id number]. Topic: Clinical - Medical Advice >> Jun 20, 2024 11:40 AM Delon HERO wrote: Reason for CRM: Patient is calling to report her granddaughter tested positive for COVID. Patient has appointment on Wednesday with no symptoms. Tested negative. Patient would like to know should she keep her appointment or test again?

## 2024-06-20 NOTE — Telephone Encounter (Signed)
 Appointment has been changed to virtual.

## 2024-06-22 ENCOUNTER — Ambulatory Visit: Attending: Family Medicine | Admitting: Family Medicine

## 2024-06-22 ENCOUNTER — Encounter: Payer: Self-pay | Admitting: Family Medicine

## 2024-06-22 DIAGNOSIS — G8929 Other chronic pain: Secondary | ICD-10-CM | POA: Diagnosis not present

## 2024-06-22 DIAGNOSIS — M791 Myalgia, unspecified site: Secondary | ICD-10-CM

## 2024-06-22 DIAGNOSIS — Z87891 Personal history of nicotine dependence: Secondary | ICD-10-CM | POA: Diagnosis not present

## 2024-06-22 DIAGNOSIS — Z79899 Other long term (current) drug therapy: Secondary | ICD-10-CM

## 2024-06-22 DIAGNOSIS — F17210 Nicotine dependence, cigarettes, uncomplicated: Secondary | ICD-10-CM

## 2024-06-22 DIAGNOSIS — M5416 Radiculopathy, lumbar region: Secondary | ICD-10-CM

## 2024-06-22 DIAGNOSIS — R600 Localized edema: Secondary | ICD-10-CM

## 2024-06-22 DIAGNOSIS — I10 Essential (primary) hypertension: Secondary | ICD-10-CM | POA: Diagnosis not present

## 2024-06-22 DIAGNOSIS — G4709 Other insomnia: Secondary | ICD-10-CM

## 2024-06-22 MED ORDER — CARVEDILOL 3.125 MG PO TABS: 3.1250 mg | ORAL_TABLET | Freq: Two times a day (BID) | ORAL | 1 refills | Status: DC

## 2024-06-22 MED ORDER — DULOXETINE HCL 60 MG PO CPEP: 60.0000 mg | ORAL_CAPSULE | Freq: Every day | ORAL | 1 refills | Status: AC

## 2024-06-22 MED ORDER — TIZANIDINE HCL 4 MG PO TABS: 4.0000 mg | ORAL_TABLET | Freq: Three times a day (TID) | ORAL | 1 refills | Status: AC | PRN

## 2024-06-22 MED ORDER — BUPROPION HCL ER (XL) 150 MG PO TB24: 150.0000 mg | ORAL_TABLET | Freq: Every day | ORAL | 1 refills | Status: DC

## 2024-06-22 MED ORDER — HYDRALAZINE HCL 50 MG PO TABS: 75.0000 mg | ORAL_TABLET | Freq: Three times a day (TID) | ORAL | 1 refills | Status: AC

## 2024-06-22 MED ORDER — PANTOPRAZOLE SODIUM 40 MG PO TBEC: 40.0000 mg | DELAYED_RELEASE_TABLET | Freq: Every day | ORAL | 1 refills | Status: DC

## 2024-06-22 MED ORDER — MIRTAZAPINE 30 MG PO TABS: ORAL_TABLET | ORAL | 1 refills | Status: AC

## 2024-06-22 MED ORDER — AMLODIPINE BESYLATE 10 MG PO TABS: ORAL_TABLET | ORAL | 1 refills | Status: DC

## 2024-06-22 NOTE — Patient Instructions (Signed)
 VISIT SUMMARY:  Today, we discussed the swelling in your ankles, your ongoing management of high blood pressure, chronic back pain, and acid reflux, as well as your successful smoking cessation. We also reviewed your general health maintenance needs.  YOUR PLAN:  -LOWER EXTREMITY EDEMA: Your ankle swelling is likely due to high sodium intake. To manage this, reduce your sodium intake, use compression stockings, and elevate your legs when sitting.  -HYPERTENSION: Your high blood pressure is being managed well with your current medications. Continue taking your prescribed blood pressure medications.  -CHRONIC BACK PAIN: Your chronic back pain is being managed with duloxetine  and tizanidine . Continue taking these medications as prescribed.  -GASTROESOPHAGEAL REFLUX DISEASE: Your acid reflux symptoms are well-controlled with pantoprazole . Continue taking pantoprazole  as prescribed.  -NICOTINE  DEPENDENCE, IN REMISSION: You have successfully quit smoking for nearly two years and are taking bupropion  to prevent relapse. Continue taking bupropion  as prescribed.  -GENERAL HEALTH MAINTENANCE: We need to update your blood tests due to your weight loss. We will conduct a comprehensive metabolic panel and lipid panel. All your medications will be refilled with a 90-day supply.  INSTRUCTIONS:  Please schedule your blood test for Monday, September 15th at 10:00 AM. Additionally, schedule a follow-up appointment in six months.

## 2024-06-22 NOTE — Progress Notes (Signed)
 Virtual Visit via Video Note  I connected with Tammy Schwartz, on 06/22/2024 at 5:11 PM by video enabled telemedicine device and verified that I am speaking with the correct person using two identifiers.   Consent: I discussed the limitations, risks, security and privacy concerns of performing an evaluation and management service by telemedicine and the availability of in person appointments. I also discussed with the patient that there may be a patient responsible charge related to this service. The patient expressed understanding and agreed to proceed.   Location of Patient: Home  Location of Provider: Clinic   Persons participating in Telemedicine visit: Tammy Schwartz Dr. Delbert    Discussed the use of AI scribe software for clinical note transcription with the patient, who gave verbal consent to proceed.  History of Present Illness Tammy Schwartz is a 68 year old female with a history of hypertension, tobacco abuse (greater than 20-pack-year history), COPD, CAD (LAD calcifications on CT scan) lumbar spondylosis status post back surgery who presents with swelling in her ankles.  She experiences intermittent swelling in her ankles for about a week, with fluctuations in size. A burning sensation occurs in her feet when standing for prolonged periods. Elevating her feet helps manage the swelling. She consumes salty foods. No shortness of breath is present.  Her current medications include blood pressure medication, duloxetine , tizanidine , pantoprazole , and rosuvastatin . She notes improvement in acid reflux symptoms with pantoprazole . She has a history of stomach ulcers  She has experienced weight loss and is unable to gain weight despite a good appetite. She was previously prescribed mirtazapine  to help increase her appetite. She quit smoking nearly two years ago and continues to take bupropion  to maintain smoking cessation.      Past Medical  History:  Diagnosis Date   Chronic back pain    HNp,spondylosis,radiculopathy   COPD (chronic obstructive pulmonary disease) (HCC)    GERD (gastroesophageal reflux disease)    Hyperlipidemia    was on choloesterol meds last yr-samples given in office but not needed since   Hypertension    takes Exforge  daily   Joint pain    Shortness of breath    takes Singulair  daily;with exertion   Sickle cell trait (HCC)    Spondylolysis    Tobacco use    No Known Allergies  Current Outpatient Medications on File Prior to Visit  Medication Sig Dispense Refill   albuterol  (VENTOLIN  HFA) 108 (90 Base) MCG/ACT inhaler INHALE 2 PUFFS INTO THE LUNGS EVERY 6 HOURS AS NEEDED *NEW PRESCRIPTION REQUEST* 54 g 1   aspirin  EC 81 MG tablet Take 1 tablet (81 mg total) by mouth daily. Swallow whole. 100 tablet 3   Azelastine  HCl 137 MCG/SPRAY SOLN USE 1 SPRAY INTO EACH NOSTRIL TWICE DAILY AS NEEDED *NEW PRESCRIPTION REQUEST* 90 mL 1   cetirizine  (ALLERGY RELIEF CETIRIZINE ) 10 MG tablet TAKE 1 TABLET BY MOUTH EVERY DAY *NEW PRESCRIPTION REQUEST* 90 tablet 1   meclizine  (ANTIVERT ) 25 MG tablet Take 1 tablet (25 mg total) by mouth 3 (three) times daily as needed for dizziness. 60 tablet 1   montelukast  (SINGULAIR ) 10 MG tablet Take 10 mg by mouth daily.     Multiple Vitamin (MULTIVITAMIN WITH MINERALS) TABS tablet Take 1 tablet by mouth daily. 30 tablet 3   polyethylene glycol powder (GLYCOLAX /MIRALAX ) powder Take 17 g by mouth daily. 3350 g 1   rosuvastatin  (CRESTOR ) 5 MG tablet Take 1 tablet (5 mg total) by mouth daily. 90 tablet  1   triamcinolone  cream (KENALOG ) 0.1 % APPLY 1 APPLICATION TOPICALLY 2 (TWO) TIMES DAILY. 80 g 0   valsartan  (DIOVAN ) 320 MG tablet Take 1 tablet (320 mg total) by mouth daily. 90 tablet 1   No current facility-administered medications on file prior to visit.    ROS: See HPI  Observations/Objective: Awake, alert, oriented x3 Not in acute distress Normal mood      Latest Ref  Rng & Units 12/16/2023    4:45 PM 06/23/2023    8:20 AM 04/22/2023    2:54 PM  CMP  Glucose 70 - 99 mg/dL 78  77  75   BUN 8 - 27 mg/dL 8  5  16    Creatinine 0.57 - 1.00 mg/dL 9.19  9.23  9.12   Sodium 134 - 144 mmol/L 145  145  140   Potassium 3.5 - 5.2 mmol/L 5.1  3.8  5.2   Chloride 96 - 106 mmol/L 106  107  104   CO2 20 - 29 mmol/L 22  23  23    Calcium  8.7 - 10.3 mg/dL 9.6  9.5  9.7     Lipid Panel     Component Value Date/Time   CHOL 160 12/02/2021 1220   TRIG 49 12/02/2021 1220   HDL 122 12/02/2021 1220   CHOLHDL 2.0 01/01/2018 1223   CHOLHDL 2.3 05/07/2016 0739   VLDL 20 05/07/2016 0739   LDLCALC 27 12/02/2021 1220   LABVLDL 11 12/02/2021 1220    Lab Results  Component Value Date   HGBA1C 5.7 (H) 12/02/2021     Assessment and plan:  Assessment & Plan Lower extremity edema Intermittent ankle swelling likely due to high sodium intake.  -Holding off on diuretic due to underweight - Recommend reducing sodium intake. - Advise use of compression stockings. - Elevate legs when sitting.  Hypertension Controlled Blood pressure management ongoing. Compliant with current antihypertensive regimen. - Continue current antihypertensive medications.  Chronic back pain/lumbar radiculopathy Chronic back pain management ongoing. Compliant with duloxetine  and tizanidine . - Continue duloxetine  and tizanidine .   Myalgia - Controlled on tizanidine  and Cymbalta    Gastroesophageal reflux disease GERD symptoms well-controlled with pantoprazole . Gastric ulcers present. - Continue pantoprazole .  Nicotine  dependence, in remission Nicotine  dependence in remission for nearly two years. Continues bupropion  to prevent relapse. - Continue bupropion .  General Health Maintenance Needs updated blood tests due to weight loss. - Order comprehensive metabolic panel and lipid panel. - Schedule blood test for Monday, September 15th at 10:00 AM. - Refill all medications with a 90-day  supply. - Schedule follow-up appointment in six months.     Meds ordered this encounter  Medications   amLODipine  (NORVASC ) 10 MG tablet    Sig: TAKE 1 TABLET BY MOUTH EVERY DAY *NEW PRESCRIPTION REQUEST*    Dispense:  90 tablet    Refill:  1   buPROPion  (WELLBUTRIN  XL) 150 MG 24 hr tablet    Sig: Take 1 tablet (150 mg total) by mouth daily.    Dispense:  90 tablet    Refill:  1   carvedilol  (COREG ) 3.125 MG tablet    Sig: Take 1 tablet (3.125 mg total) by mouth 2 (two) times daily with a meal.    Dispense:  180 tablet    Refill:  1   DULoxetine  (CYMBALTA ) 60 MG capsule    Sig: Take 1 capsule (60 mg total) by mouth daily.    Dispense:  90 capsule    Refill:  1  hydrALAZINE  (APRESOLINE ) 50 MG tablet    Sig: Take 1.5 tablets (75 mg total) by mouth 3 (three) times daily.    Dispense:  405 tablet    Refill:  1   mirtazapine  (REMERON ) 30 MG tablet    Sig: TAKE 1 TABLET BY MOUTH EVERY NIGHT AT BEDTIME *NEW PRESCRIPTION REQUEST*    Dispense:  90 tablet    Refill:  1   pantoprazole  (PROTONIX ) 40 MG tablet    Sig: Take 1 tablet (40 mg total) by mouth daily.    Dispense:  90 tablet    Refill:  1   tiZANidine  (ZANAFLEX ) 4 MG tablet    Sig: Take 1 tablet (4 mg total) by mouth every 8 (eight) hours as needed for muscle spasms.    Dispense:  90 tablet    Refill:  1    Follow Up Instructions: 6 months for chronic disease management Scheduled for fasting labs in 1 week   I discussed the assessment and treatment plan with the patient. The patient was provided an opportunity to ask questions and all were answered. The patient agreed with the plan and demonstrated an understanding of the instructions.   The patient was advised to call back or seek an in-person evaluation if the symptoms worsen or if the condition fails to improve as anticipated.     I provided 18 minutes total of Telehealth time during this encounter including median intraservice time, reviewing previous notes,  investigations, ordering medications, medical decision making, coordinating care and patient verbalized understanding at the end of the visit.     Tammy Sabin, MD, FAAFP. [facility] [place], [place] [phone]   06/22/2024, 5:11 PM

## 2024-06-23 ENCOUNTER — Telehealth: Payer: Self-pay | Admitting: Family Medicine

## 2024-06-23 NOTE — Telephone Encounter (Signed)
-----   Message from [place] sent at 06/22/2024  4:53 PM EDT ----- Regarding: Labs Hi, Can you please schedule her for Labs on 06/27/24, 10am?  Thanks, Dr Newlin

## 2024-06-23 NOTE — Telephone Encounter (Signed)
 Called patient, No answer. Left VM for patient to call back.

## 2024-06-27 ENCOUNTER — Ambulatory Visit: Attending: Family Medicine

## 2024-06-27 DIAGNOSIS — I10 Essential (primary) hypertension: Secondary | ICD-10-CM

## 2024-06-28 ENCOUNTER — Ambulatory Visit: Payer: Self-pay | Admitting: Family Medicine

## 2024-06-28 LAB — CMP14+EGFR
ALT: 37 IU/L — ABNORMAL HIGH (ref 0–32)
AST: 49 IU/L — ABNORMAL HIGH (ref 0–40)
Albumin: 4.4 g/dL (ref 3.9–4.9)
Alkaline Phosphatase: 106 IU/L (ref 51–125)
BUN/Creatinine Ratio: 7 — ABNORMAL LOW (ref 12–28)
BUN: 7 mg/dL — ABNORMAL LOW (ref 8–27)
Bilirubin Total: 0.4 mg/dL (ref 0.0–1.2)
CO2: 22 mmol/L (ref 20–29)
Calcium: 9.2 mg/dL (ref 8.7–10.3)
Chloride: 110 mmol/L — ABNORMAL HIGH (ref 96–106)
Creatinine, Ser: 0.94 mg/dL (ref 0.57–1.00)
Globulin, Total: 2.5 g/dL (ref 1.5–4.5)
Glucose: 68 mg/dL — ABNORMAL LOW (ref 70–99)
Potassium: 3.3 mmol/L — ABNORMAL LOW (ref 3.5–5.2)
Sodium: 148 mmol/L — ABNORMAL HIGH (ref 134–144)
Total Protein: 6.9 g/dL (ref 6.0–8.5)
eGFR: 66 mL/min/1.73 (ref >59)

## 2024-06-28 LAB — CBC WITH DIFFERENTIAL/PLATELET
Basophils Absolute: 0 x10E3/uL (ref 0.0–0.2)
Basos: 1 %
EOS (ABSOLUTE): 0.1 x10E3/uL (ref 0.0–0.4)
Eos: 3 %
Hematocrit: 43.8 % (ref 34.0–46.6)
Hemoglobin: 14.4 g/dL (ref 11.1–15.9)
Immature Grans (Abs): 0 x10E3/uL (ref 0.0–0.1)
Immature Granulocytes: 0 %
Lymphocytes Absolute: 1.8 x10E3/uL (ref 0.7–3.1)
Lymphs: 41 %
MCH: 31.6 pg (ref 26.6–33.0)
MCHC: 32.9 g/dL (ref 31.5–35.7)
MCV: 96 fL (ref 79–97)
Monocytes Absolute: 0.3 x10E3/uL (ref 0.1–0.9)
Monocytes: 8 %
Neutrophils Absolute: 2.1 x10E3/uL (ref 1.4–7.0)
Neutrophils: 47 %
Platelets: 196 x10E3/uL (ref 150–450)
RBC: 4.56 x10E6/uL (ref 3.77–5.28)
RDW: 16 % — ABNORMAL HIGH (ref 11.7–15.4)
WBC: 4.4 x10E3/uL (ref 3.4–10.8)

## 2024-06-28 LAB — LP+NON-HDL CHOLESTEROL
Cholesterol, Total: 166 mg/dL (ref 100–199)
HDL: 128 mg/dL (ref >39)
LDL Chol Calc (NIH): 26 mg/dL (ref 0–99)
Total Non-HDL-Chol (LDL+VLDL): 38 mg/dL (ref 0–129)
Triglycerides: 59 mg/dL (ref 0–149)
VLDL Cholesterol Cal: 12 mg/dL (ref 5–40)

## 2024-06-28 MED ORDER — POTASSIUM CHLORIDE ER 10 MEQ PO TBCR
10.0000 meq | EXTENDED_RELEASE_TABLET | Freq: Every day | ORAL | 1 refills | Status: AC
Start: 2024-06-28 — End: ?

## 2024-08-03 ENCOUNTER — Other Ambulatory Visit: Payer: Self-pay | Admitting: Pharmacist

## 2024-08-03 NOTE — Progress Notes (Signed)
 Pharmacy Quality Measure Review  This patient is appearing on a report for the adherence measure for cholesterol (statin) medications this calendar year.   Medication: rosuvastatin  Last fill date: 06/03/2024 for 90 day supply  Insurance report was not up to date. No action needed at this time.  Adherence looks great. Reminder set for 08/30/2024 for next fill.   Tammy Schwartz, PharmD, JAQUELINE, CPP Clinical Pharmacist [facility] [phone]

## 2024-08-10 ENCOUNTER — Emergency Department (HOSPITAL_COMMUNITY)

## 2024-08-10 ENCOUNTER — Other Ambulatory Visit: Payer: Self-pay

## 2024-08-10 ENCOUNTER — Emergency Department (HOSPITAL_COMMUNITY)
Admission: EM | Admit: 2024-08-10 | Discharge: 2024-08-11 | Disposition: A | Discharge status: Home / Self Care | Attending: Emergency Medicine | Admitting: Emergency Medicine

## 2024-08-10 ENCOUNTER — Encounter (HOSPITAL_COMMUNITY): Payer: Self-pay | Admitting: Emergency Medicine

## 2024-08-10 DIAGNOSIS — J439 Emphysema, unspecified: Secondary | ICD-10-CM | POA: Diagnosis not present

## 2024-08-10 DIAGNOSIS — M545 Low back pain, unspecified: Secondary | ICD-10-CM | POA: Diagnosis present

## 2024-08-10 DIAGNOSIS — M5441 Lumbago with sciatica, right side: Secondary | ICD-10-CM | POA: Insufficient documentation

## 2024-08-10 DIAGNOSIS — I10 Essential (primary) hypertension: Secondary | ICD-10-CM | POA: Insufficient documentation

## 2024-08-10 DIAGNOSIS — M5442 Lumbago with sciatica, left side: Secondary | ICD-10-CM | POA: Insufficient documentation

## 2024-08-10 DIAGNOSIS — G8929 Other chronic pain: Secondary | ICD-10-CM | POA: Insufficient documentation

## 2024-08-10 DIAGNOSIS — Z7982 Long term (current) use of aspirin: Secondary | ICD-10-CM | POA: Insufficient documentation

## 2024-08-10 DIAGNOSIS — F17200 Nicotine dependence, unspecified, uncomplicated: Secondary | ICD-10-CM | POA: Insufficient documentation

## 2024-08-10 DIAGNOSIS — R911 Solitary pulmonary nodule: Secondary | ICD-10-CM | POA: Diagnosis not present

## 2024-08-10 DIAGNOSIS — R42 Dizziness and giddiness: Secondary | ICD-10-CM | POA: Diagnosis not present

## 2024-08-10 DIAGNOSIS — J449 Chronic obstructive pulmonary disease, unspecified: Secondary | ICD-10-CM | POA: Insufficient documentation

## 2024-08-10 DIAGNOSIS — R109 Unspecified abdominal pain: Secondary | ICD-10-CM | POA: Diagnosis not present

## 2024-08-10 DIAGNOSIS — Z79899 Other long term (current) drug therapy: Secondary | ICD-10-CM | POA: Insufficient documentation

## 2024-08-10 DIAGNOSIS — E876 Hypokalemia: Secondary | ICD-10-CM | POA: Diagnosis not present

## 2024-08-10 LAB — CBC WITH DIFFERENTIAL/PLATELET
Abs Immature Granulocytes: 0.01 K/uL (ref 0.00–0.07)
Basophils Absolute: 0 K/uL (ref 0.0–0.1)
Basophils Relative: 1 %
Eosinophils Absolute: 0.1 K/uL (ref 0.0–0.5)
Eosinophils Relative: 1 %
HCT: 39 % (ref 36.0–46.0)
Hemoglobin: 13.3 g/dL (ref 12.0–15.0)
Immature Granulocytes: 0 %
Lymphocytes Relative: 33 %
Lymphs Abs: 1.7 K/uL (ref 0.7–4.0)
MCH: 30.4 pg (ref 26.0–34.0)
MCHC: 34.1 g/dL (ref 30.0–36.0)
MCV: 89 fL (ref 80.0–100.0)
Monocytes Absolute: 0.4 K/uL (ref 0.1–1.0)
Monocytes Relative: 8 %
Neutro Abs: 3.1 K/uL (ref 1.7–7.7)
Neutrophils Relative %: 57 %
Platelets: 224 K/uL (ref 150–400)
RBC: 4.38 MIL/uL (ref 3.87–5.11)
RDW: 14.8 % (ref 11.5–15.5)
WBC: 5.3 K/uL (ref 4.0–10.5)
nRBC: 0 % (ref 0.0–0.2)

## 2024-08-10 LAB — BASIC METABOLIC PANEL WITH GFR
Anion gap: 17 — ABNORMAL HIGH (ref 5–15)
BUN: 5 mg/dL — ABNORMAL LOW (ref 8–23)
CO2: 23 mmol/L (ref 22–32)
Calcium: 8.8 mg/dL — ABNORMAL LOW (ref 8.9–10.3)
Chloride: 102 mmol/L (ref 98–111)
Creatinine, Ser: 0.95 mg/dL (ref 0.44–1.00)
GFR, Estimated: >60 mL/min (ref >60)
Glucose, Bld: 87 mg/dL (ref 70–99)
Potassium: 2.7 mmol/L — CL (ref 3.5–5.1)
Sodium: 142 mmol/L (ref 135–145)

## 2024-08-10 LAB — URINALYSIS, ROUTINE W REFLEX MICROSCOPIC
Bilirubin Urine: NEGATIVE
Glucose, UA: NEGATIVE mg/dL
Hgb urine dipstick: NEGATIVE
Ketones, ur: NEGATIVE mg/dL
Leukocytes,Ua: NEGATIVE
Nitrite: NEGATIVE
Protein, ur: NEGATIVE mg/dL
Specific Gravity, Urine: 1.002 — ABNORMAL LOW (ref 1.005–1.030)
pH: 7 (ref 5.0–8.0)

## 2024-08-10 LAB — MAGNESIUM: Magnesium: 2 mg/dL (ref 1.7–2.4)

## 2024-08-10 MED ORDER — DEXAMETHASONE SOD PHOSPHATE PF 10 MG/ML IJ SOLN
10.0000 mg | Freq: Once | INTRAMUSCULAR | Status: AC
  Administered 2024-08-10: 10 mg via INTRAVENOUS

## 2024-08-10 MED ORDER — KETOROLAC TROMETHAMINE 15 MG/ML IJ SOLN
15.0000 mg | Freq: Once | INTRAMUSCULAR | Status: AC
  Administered 2024-08-10: 15 mg via INTRAVENOUS
  Filled 2024-08-10: qty 1

## 2024-08-10 MED ORDER — POTASSIUM CHLORIDE 10 MEQ/100ML IV SOLN
10.0000 meq | Freq: Once | INTRAVENOUS | Status: AC
  Administered 2024-08-10: 10 meq via INTRAVENOUS
  Filled 2024-08-10: qty 100

## 2024-08-10 MED ORDER — LORAZEPAM 2 MG/ML IJ SOLN
0.5000 mg | Freq: Once | INTRAMUSCULAR | Status: AC
  Administered 2024-08-10: 0.5 mg via INTRAVENOUS
  Filled 2024-08-10: qty 1

## 2024-08-10 MED ORDER — GADOBUTROL 1 MMOL/ML IV SOLN
3.5000 mL | Freq: Once | INTRAVENOUS | Status: AC | PRN
Start: 2024-08-10 — End: 2024-08-10
  Administered 2024-08-10: 3.5 mL via INTRAVENOUS

## 2024-08-10 MED ORDER — MAGNESIUM SULFATE 2 GM/50ML IV SOLN
2.0000 g | Freq: Once | INTRAVENOUS | Status: AC
  Administered 2024-08-10: 2 g via INTRAVENOUS
  Filled 2024-08-10: qty 50

## 2024-08-10 MED ORDER — HYDROCODONE-ACETAMINOPHEN 5-325 MG PO TABS
1.0000 | ORAL_TABLET | Freq: Once | ORAL | Status: AC
  Administered 2024-08-10: 1 via ORAL
  Filled 2024-08-10: qty 1

## 2024-08-10 MED ORDER — MORPHINE SULFATE (PF) 4 MG/ML IV SOLN
4.0000 mg | Freq: Once | INTRAVENOUS | Status: AC
  Administered 2024-08-10: 4 mg via INTRAVENOUS
  Filled 2024-08-10: qty 1

## 2024-08-10 MED ORDER — ONDANSETRON 4 MG PO TBDP
4.0000 mg | ORAL_TABLET | Freq: Once | ORAL | Status: AC
  Administered 2024-08-10: 4 mg via ORAL
  Filled 2024-08-10: qty 1

## 2024-08-10 MED ORDER — POTASSIUM CHLORIDE CRYS ER 20 MEQ PO TBCR
60.0000 meq | EXTENDED_RELEASE_TABLET | Freq: Once | ORAL | Status: AC
  Administered 2024-08-10: 60 meq via ORAL
  Filled 2024-08-10: qty 3

## 2024-08-10 NOTE — ED Provider Notes (Signed)
 [facility] AT [facility] Provider Note   CSN: [id number] Arrival date & time: 08/10/24  1247     Patient presents with: Back Pain   Tammy Schwartz is a 68 y.o. female.  {Add pertinent medical, surgical, social history, OB history to YEP:67052} Patient with history of chronic back pain, COPD, hypertension, hyperlipidemia, spondylolysis presents today with complaints of back pain. Reports that she has had chronic low back pain for many years, has had several back surgeries as well. Reports over the past 2 days her pain has become significantly worse than normal. She has been taking ibuprofen  with minimal relief. Reports that she has been told that she likely needs another spinal surgery, however she has not decided if she wants this or not. Reports it has been several years since she last had any spinal imaging done. Denies any new trauma. Pain is radiating down both her legs but is worse on the left.  Denies any loss of bowel or bladder function or saddle anesthesia.  No numbness or tingling in her legs.  She is walking with a cane which is not new for her.  Does report that she is having more difficulty with ambulation over the last few days.  Denies any personal history of malignancy or IVDU. No fevers or chills.  Of note, patient reports she has history of hypokalemia, is supposed to be on supplementation but states she only takes it on occasion.  Reports she uses her albuterol  inhaler most every day which is not new for her.  Denies nausea, vomiting, or diarrhea.  No chest pain or shortness of breath.  The history is provided by the patient. No language interpreter was used.  Back Pain      Prior to Admission medications   Medication Sig Start Date End Date Taking? Authorizing Provider  albuterol  (VENTOLIN  HFA) 108 (90 Base) MCG/ACT inhaler INHALE 2 PUFFS INTO THE LUNGS EVERY 6 HOURS AS NEEDED *NEW PRESCRIPTION REQUEST* 02/04/24   Delbert Clam, MD  amLODipine  (NORVASC ) 10 MG tablet TAKE 1 TABLET BY MOUTH EVERY DAY *NEW PRESCRIPTION REQUEST* 06/22/24   Newlin, Enobong, MD  aspirin  EC 81 MG tablet Take 1 tablet (81 mg total) by mouth daily. Swallow whole. 08/26/21   Ross, Paula V, MD  Azelastine  HCl 137 MCG/SPRAY SOLN USE 1 SPRAY INTO EACH NOSTRIL TWICE DAILY AS NEEDED *NEW PRESCRIPTION REQUEST* 02/04/24   Newlin, Enobong, MD  buPROPion  (WELLBUTRIN  XL) 150 MG 24 hr tablet Take 1 tablet (150 mg total) by mouth daily. 06/22/24   Newlin, Enobong, MD  carvedilol  (COREG ) 3.125 MG tablet Take 1 tablet (3.125 mg total) by mouth 2 (two) times daily with a meal. 06/22/24   Delbert Clam, MD  cetirizine  (ALLERGY RELIEF CETIRIZINE ) 10 MG tablet TAKE 1 TABLET BY MOUTH EVERY DAY *NEW PRESCRIPTION REQUEST* 02/04/24   Newlin, Enobong, MD  DULoxetine  (CYMBALTA ) 60 MG capsule Take 1 capsule (60 mg total) by mouth daily. 06/22/24   Newlin, Enobong, MD  hydrALAZINE  (APRESOLINE ) 50 MG tablet Take 1.5 tablets (75 mg total) by mouth 3 (three) times daily. 06/22/24   Newlin, Enobong, MD  meclizine  (ANTIVERT ) 25 MG tablet Take 1 tablet (25 mg total) by mouth 3 (three) times daily as needed for dizziness. 11/29/19   Newlin, Enobong, MD  mirtazapine  (REMERON ) 30 MG tablet TAKE 1 TABLET BY MOUTH EVERY NIGHT AT BEDTIME *NEW PRESCRIPTION REQUEST* 06/22/24   Newlin, Enobong, MD  montelukast  (SINGULAIR ) 10 MG tablet Take 10 mg by  mouth daily.    [provider]  Multiple Vitamin (MULTIVITAMIN WITH MINERALS) TABS tablet Take 1 tablet by mouth daily. 02/08/24   Newlin, Enobong, MD  pantoprazole  (PROTONIX ) 40 MG tablet Take 1 tablet (40 mg total) by mouth daily. 06/22/24   Newlin, Enobong, MD  polyethylene glycol powder (GLYCOLAX /MIRALAX ) powder Take 17 g by mouth daily. 06/09/17   Newlin, Enobong, MD  potassium chloride  (KLOR-CON  10) 10 MEQ tablet Take 1 tablet (10 mEq total) by mouth daily. 06/28/24   Newlin, Enobong, MD  rosuvastatin  (CRESTOR ) 5 MG tablet Take 1  tablet (5 mg total) by mouth daily. 02/08/24   Okey Vina GAILS, MD  tiZANidine  (ZANAFLEX ) 4 MG tablet Take 1 tablet (4 mg total) by mouth every 8 (eight) hours as needed for muscle spasms. 06/22/24   Newlin, Enobong, MD  triamcinolone  cream (KENALOG ) 0.1 % APPLY 1 APPLICATION TOPICALLY 2 (TWO) TIMES DAILY. 04/04/20   Newlin, Enobong, MD  valsartan  (DIOVAN ) 320 MG tablet Take 1 tablet (320 mg total) by mouth daily. 02/08/24   Okey Vina GAILS, MD    Allergies: Patient has no known allergies.    Review of Systems  Musculoskeletal:  Positive for back pain.  All other systems reviewed and are negative.   Updated Vital Signs BP (!) 153/77   Pulse 66   Temp 98.3 F (36.8 C) (Oral)   Resp 13   LMP 03/13/2006   SpO2 100%   Physical Exam Vitals and nursing note reviewed.  Constitutional:      General: She is not in acute distress.    Appearance: Normal appearance. She is normal weight. She is not ill-appearing, toxic-appearing or diaphoretic.  HENT:     Head: Normocephalic and atraumatic.  Cardiovascular:     Rate and Rhythm: Normal rate.  Pulmonary:     Effort: Pulmonary effort is normal. No respiratory distress.  Abdominal:     General: Abdomen is flat.     Palpations: Abdomen is soft.     Tenderness: There is no abdominal tenderness.  Musculoskeletal:        General: Normal range of motion.     Cervical back: Normal range of motion.     Comments: Tenderness noted to palpation of the mid lumbar spine and surrounding paraspinous musculature.  No step-offs, lesions, deformity, or overlying skin changes.  Able to lift both lower legs off the bed and hold them up without assistance.  Able to plantarflex and dorsiflex BLE as well. DP and PT pulses intact and 2+.   Skin:    General: Skin is warm and dry.  Neurological:     General: No focal deficit present.     Mental Status: She is alert.  Psychiatric:        Mood and Affect: Mood normal.        Behavior: Behavior normal.     (all  labs ordered are listed, but only abnormal results are displayed) Labs Reviewed  URINALYSIS, ROUTINE W REFLEX MICROSCOPIC - Abnormal; Notable for the following components:      Result Value   Color, Urine STRAW (*)    Specific Gravity, Urine 1.002 (*)    All other components within normal limits  BASIC METABOLIC PANEL WITH GFR - Abnormal; Notable for the following components:   Potassium 2.7 (*)    BUN 5 (*)    Calcium  8.8 (*)    Anion gap 17 (*)    All other components within normal limits  CBC WITH DIFFERENTIAL/PLATELET  MAGNESIUM     EKG: EKG Interpretation Date/Time:  Wednesday August 10 2024 16:56:49 EDT Ventricular Rate:  68 PR Interval:  156 QRS Duration:  79 QT Interval:  404 QTC Calculation: 430 R Axis:   46  Text Interpretation: Sinus rhythm Right atrial enlargement Anteroseptal infarct, old Nonspecific T abnormalities, lateral leads Confirmed by Ruthe Cornet [phone]) on 08/10/2024 5:04:54 PM  Radiology: CT L-SPINE NO CHARGE Result Date: 08/10/2024 CLINICAL DATA:  Back pain. EXAM: CT LUMBAR SPINE WITHOUT CONTRAST TECHNIQUE: Multidetector CT imaging of the lumbar spine was performed without intravenous contrast administration. Multiplanar CT image reconstructions were also generated. RADIATION DOSE REDUCTION: This exam was performed according to the departmental dose-optimization program which includes automated exposure control, adjustment of the mA and/or kV according to patient size and/or use of iterative reconstruction technique. COMPARISON:  Radiograph 01/09/2022 FINDINGS: Segmentation: 5 lumbar type vertebrae. Alignment: Dextroscoliotic curvature centered on L3-L4, similar to prior radiograph. Minimal right lateral translation of L4 on L5. No significant listhesis. Vertebrae: No acute fracture. Vertebral body heights are normal. Endplate sclerosis at L3-L4 and L5-S1 with mild associated disc irregularity. Endplate irregularity at L3-L4 has progressed from prior exam.  Paraspinal and other soft tissues: No paraspinal muscle abnormality. Complete assessment on concurrent abdominopelvic CT, reported separately. Disc levels: T12-L1: No spinal canal or neural foraminal stenosis. L1-L2: Slight broad-based disc bulge. No spinal canal or neural foraminal stenosis. L2-L3: Moderate broad-based disc bulge. Mild facet and ligamentum flavum hypertrophy. Mild narrowing of the spinal canal. L3-L4: Broad-based disc bulge. Moderate facet ligamentum flavum hypertrophy, more so on the left. Spinal canal and right neural foraminal stenosis. L4-L5: Broad-based disc bulge with right paracentral disc protrusion. Right facet hypertrophy, bilateral ligamentum flavum thickening. Spinal canal and bilateral neural foraminal stenosis. L5-S1: Broad-based disc bulge. No spinal canal stenosis. Right neural foraminal stenosis. IMPRESSION: 1. No acute fracture of the lumbar spine. 2. Multilevel degenerative disc disease and facet arthropathy with spinal canal and neural foraminal stenosis as described above. 3. Endplate sclerosis at L3-L4 and L5-S1 with mild associated disc irregularity. Endplate irregularity at L3-L4 has progressed from prior exam. This is likely degenerative air related to Modic endplate changes, however if there is clinical concern for discitis/osteomyelitis, recommend further evaluation with MRI. Electronically Signed   By: Andrea Gasman M.D.   On: 08/10/2024 17:10   CT Renal Stone Study Result Date: 08/10/2024 EXAM: CT ABDOMEN AND PELVIS WITHOUT CONTRAST 08/10/2024 04:52:00 PM TECHNIQUE: CT of the abdomen and pelvis was performed without the administration of intravenous contrast. Multiplanar reformatted images are provided for review. Automated exposure control, iterative reconstruction, and/or weight-based adjustment of the mA/kV was utilized to reduce the radiation dose to as low as reasonably achievable. COMPARISON: 08/08/2021 CLINICAL HISTORY: Abdominal/flank pain, stone  suspected. Back Pain; CT Renal Stone Study and L-Spine No Charge; Abdominal/flank pain, stone suspected; See ED Notes:; Back Pain; CT Renal Stone Study and L-Spine No Charge; Abdominal/flank pain, stone- suspected; See ED Notes:; -PT arrives via POV. PT reports extensive back problems. She states her lower back pain became worse 2 days ago. Denies injury. PT is AxOx4. ; Back Pain; CT Renal Stone Study and L-Spine No Charge; Abdominal/flank pain, stone- suspected; See ED Notes:; -PT arrives via POV. PT reports extensive back problems. She states her lower back pain became worse 2 days ago. Denies injury. PT is AxOx4. ; -Pt endorses back pain radiating to legs. Pt endorses 10/10 pain. FINDINGS: LOWER CHEST: No acute abnormality. LIVER: The liver is unremarkable. GALLBLADDER AND BILE DUCTS:  Gallbladder is unremarkable. No biliary ductal dilatation. SPLEEN: No acute abnormality. PANCREAS: No acute abnormality. ADRENAL GLANDS: No acute abnormality. KIDNEYS, URETERS AND BLADDER: Bilateral nonobstructive nephrolithiasis. No stones in the ureters. No hydronephrosis. No perinephric or periureteral stranding. Urinary bladder is unremarkable. GI AND BOWEL: Stomach demonstrates no acute abnormality. There is no bowel obstruction. PERITONEUM AND RETROPERITONEUM: No ascites. No free air. VASCULATURE: Aorta is normal in caliber. Aortic atherosclerosis. LYMPH NODES: No lymphadenopathy. REPRODUCTIVE ORGANS: No acute abnormality. BONES AND SOFT TISSUES: No acute osseous abnormality. No focal soft tissue abnormality. IMPRESSION: 1. Bilateral nonobstructive nephrolithiasis. Electronically signed by: Lynwood Seip MD 08/10/2024 05:08 PM EDT RP Workstation: [id number]    {Document cardiac monitor, telemetry assessment procedure when appropriate:32947} Procedures   Medications Ordered in the ED  magnesium  sulfate IVPB 2 g 50 mL (has no administration in time range)  potassium chloride  10 mEq in 100 mL IVPB (has no administration in  time range)  potassium chloride  SA (KLOR-CON  M) CR tablet 60 mEq (has no administration in time range)  ketorolac  (TORADOL ) 15 MG/ML injection 15 mg (has no administration in time range)  HYDROcodone -acetaminophen  (NORCO/VICODIN) 5-325 MG per tablet 1 tablet (1 tablet Oral Given 08/10/24 1332)  ondansetron  (ZOFRAN -ODT) disintegrating tablet 4 mg (4 mg Oral Given 08/10/24 1334)      {Click here for ABCD2, HEART and other calculators REFRESH Note before signing:1}                              Medical Decision Making Amount and/or Complexity of Data Reviewed Labs: ordered. Radiology: ordered.  Risk Prescription drug management.   This patient is a 68 y.o. female who presents to the ED for concern of back pain, this involves an extensive number of treatment options, and is a complaint that carries with it a high risk of complications and morbidity. The emergent differential diagnosis prior to evaluation includes, but is not limited to,  Fracture (acute/chronic), muscle strain, cauda equina, spinal stenosis, DDD, metastatic cancer, vertebral osteomyelitis, kidney stone, pyelonephritis, AAA, pancreatitis, bowel obstruction, meningitis.  This is not an exhaustive differential.   Past Medical History / Co-morbidities / Social History:  has a past medical history of Chronic back pain, COPD (chronic obstructive pulmonary disease) (HCC), GERD (gastroesophageal reflux disease), Hyperlipidemia, Hypertension, Joint pain, Shortness of breath, Sickle cell trait, Spondylolysis, and Tobacco use.  Additional history: Chart reviewed. Pertinent results include: last spinal imaging available in 2023 which was a lumbar spine x-ray and showed progressed lumbosacral spondylosis  Physical Exam: Physical exam performed. The pertinent findings include:   Tenderness noted to palpation of the mid lumbar spine and surrounding paraspinous musculature.  No step-offs, lesions, deformity, or overlying skin  changes.  Able to lift both lower legs off the bed and hold them up without assistance.  Able to plantarflex and dorsiflex BLE as well. DP and PT pulses intact and 2+.   Lab Tests: I ordered, and personally interpreted labs.  The pertinent results include:  ***   Imaging Studies: I ordered imaging studies including ***. I independently visualized and interpreted imaging which showed ***. I agree with the radiologist interpretation.   Cardiac Monitoring:  The patient was maintained on a cardiac monitor.  My attending physician Dr. PIERRETTE viewed and interpreted the cardiac monitored which showed an underlying rhythm of: ***. I agree with this interpretation.   Medications: I ordered medication including ***  for ***. Reevaluation of the patient after these medicines  showed that the patient {resolved/improved/worsened:23923::improved}. I have reviewed the patients home medicines and have made adjustments as needed.  Consultations Obtained: I requested consultation with the ***,  and discussed lab and imaging findings as well as pertinent plan - they recommend: ***   Disposition: After consideration of the diagnostic results and the patients response to treatment, I feel that *** .   ***emergency department workup does not suggest an emergent condition requiring admission or immediate intervention beyond what has been performed at this time. The plan is: ***. The patient is safe for discharge and has been instructed to return immediately for worsening symptoms, change in symptoms or any other concerns.  I discussed this case with my attending physician Dr. PIERRETTE who cosigned this note including patient's presenting symptoms, physical exam, and planned diagnostics and interventions. Attending physician stated agreement with plan or made changes to plan which were implemented.     {Document critical care time when appropriate  Document review of labs and clinical decision tools ie CHADS2VASC2,  etc  Document your independent review of radiology images and any outside records  Document your discussion with family members, caretakers and with consultants  Document social determinants of health affecting pt's care  Document your decision making why or why not admission, treatments were needed:32947:::1}   Final diagnoses:  None    ED Discharge Orders     None

## 2024-08-10 NOTE — ED Notes (Signed)
 Patient transported to CT

## 2024-08-10 NOTE — ED Triage Notes (Signed)
 Pt endorses back  pain radiating to legs. Pt endorses 10/10 pain.

## 2024-08-10 NOTE — ED Provider Triage Note (Signed)
 Emergency Medicine Provider Triage Evaluation Note  Tammy Schwartz , a 68 y.o. female  was evaluated in triage.  Pt complains of chronic back pain.  Review of Systems  Positive: Lumbar radiculopathy Negative: Bowel/bladder incontinence, numbness  Physical Exam  BP (!) 172/74 (BP Location: Left Arm)   Pulse 75   Temp 98.5 F (36.9 C) (Oral)   Resp 14   LMP 03/13/2006   SpO2 98%  Gen:   Awake, no distress   Resp:  Normal effort  MSK:   Moves extremities without difficulty  Other:    Medical Decision Making  Medically screening exam initiated at 1:15 PM.  Appropriate orders placed.  Tammy Schwartz was informed that the remainder of the evaluation will be completed by another provider, this initial triage assessment does not replace that evaluation, and the importance of remaining in the ED until their evaluation is complete.  Here with uncontrolled chronic back pain. Usually better with ibuprofen  or naproxen . No neurologic red flags.   Tammy Balls, PA-C 08/10/24 1317

## 2024-08-10 NOTE — ED Triage Notes (Signed)
 PT arrives via POV. PT reports extensive back problems. She states her lower back pain became worse 2 days ago. Denies injury. PT is AxOx4.

## 2024-08-10 NOTE — ED Provider Notes (Incomplete)
 [place] EMERGENCY DEPARTMENT AT [facility] Provider Note   CSN: [id number] Arrival date & time: 08/10/24  1247     Patient presents with: Back Pain   Tammy Schwartz is a 68 y.o. female.  {Add pertinent medical, surgical, social history, OB history to YEP:67052} Patient with history of chronic back pain, COPD, hypertension, hyperlipidemia, spondylolysis presents today with complaints of back pain. Reports that she has had chronic low back pain for many years, has had several back surgeries as well. Reports over the past 2 days her pain has become significantly worse than normal. She has been taking ibuprofen  with minimal relief. Reports that she has been told that she likely needs another spinal surgery, however she has not decided if she wants this or not. Reports it has been several years since she last had any spinal imaging done. Denies any new trauma. Pain is radiating down both her legs but is worse on the left.  Denies any loss of bowel or bladder function or saddle anesthesia.  No numbness or tingling in her legs.  She is walking with a cane which is not new for her.  Does report that she is having more difficulty with ambulation over the last few days.  Denies any personal history of malignancy or IVDU. No fevers or chills.  Of note, patient reports she has history of hypokalemia, is supposed to be on supplementation but states she only takes it on occasion.  Reports she uses her albuterol  inhaler most every day which is not new for her.  Denies nausea, vomiting, or diarrhea.  No chest pain or shortness of breath.  The history is provided by the patient. No language interpreter was used.  Back Pain      Prior to Admission medications   Medication Sig Start Date End Date Taking? Authorizing Provider  albuterol  (VENTOLIN  HFA) 108 (90 Base) MCG/ACT inhaler INHALE 2 PUFFS INTO THE LUNGS EVERY 6 HOURS AS NEEDED *NEW PRESCRIPTION REQUEST* 02/04/24   Newlin,  Enobong, MD  amLODipine  (NORVASC ) 10 MG tablet TAKE 1 TABLET BY MOUTH EVERY DAY *NEW PRESCRIPTION REQUEST* 06/22/24   Newlin, Enobong, MD  aspirin  EC 81 MG tablet Take 1 tablet (81 mg total) by mouth daily. Swallow whole. 08/26/21   Ross, Paula V, MD  Azelastine  HCl 137 MCG/SPRAY SOLN USE 1 SPRAY INTO EACH NOSTRIL TWICE DAILY AS NEEDED *NEW PRESCRIPTION REQUEST* 02/04/24   Newlin, Enobong, MD  buPROPion  (WELLBUTRIN  XL) 150 MG 24 hr tablet Take 1 tablet (150 mg total) by mouth daily. 06/22/24   Newlin, Enobong, MD  carvedilol  (COREG ) 3.125 MG tablet Take 1 tablet (3.125 mg total) by mouth 2 (two) times daily with a meal. 06/22/24   Delbert Clam, MD  cetirizine  (ALLERGY RELIEF CETIRIZINE ) 10 MG tablet TAKE 1 TABLET BY MOUTH EVERY DAY *NEW PRESCRIPTION REQUEST* 02/04/24   Newlin, Enobong, MD  DULoxetine  (CYMBALTA ) 60 MG capsule Take 1 capsule (60 mg total) by mouth daily. 06/22/24   Newlin, Enobong, MD  hydrALAZINE  (APRESOLINE ) 50 MG tablet Take 1.5 tablets (75 mg total) by mouth 3 (three) times daily. 06/22/24   Newlin, Enobong, MD  meclizine  (ANTIVERT ) 25 MG tablet Take 1 tablet (25 mg total) by mouth 3 (three) times daily as needed for dizziness. 11/29/19   Newlin, Enobong, MD  mirtazapine  (REMERON ) 30 MG tablet TAKE 1 TABLET BY MOUTH EVERY NIGHT AT BEDTIME *NEW PRESCRIPTION REQUEST* 06/22/24   Newlin, Enobong, MD  montelukast  (SINGULAIR ) 10 MG tablet Take 10 mg by  mouth daily.    [provider]  Multiple Vitamin (MULTIVITAMIN WITH MINERALS) TABS tablet Take 1 tablet by mouth daily. 02/08/24   Newlin, Enobong, MD  pantoprazole  (PROTONIX ) 40 MG tablet Take 1 tablet (40 mg total) by mouth daily. 06/22/24   Newlin, Enobong, MD  polyethylene glycol powder (GLYCOLAX /MIRALAX ) powder Take 17 g by mouth daily. 06/09/17   Newlin, Enobong, MD  potassium chloride  (KLOR-CON  10) 10 MEQ tablet Take 1 tablet (10 mEq total) by mouth daily. 06/28/24   Newlin, Enobong, MD  rosuvastatin  (CRESTOR ) 5 MG tablet Take 1  tablet (5 mg total) by mouth daily. 02/08/24   Okey Vina GAILS, MD  tiZANidine  (ZANAFLEX ) 4 MG tablet Take 1 tablet (4 mg total) by mouth every 8 (eight) hours as needed for muscle spasms. 06/22/24   Newlin, Enobong, MD  triamcinolone  cream (KENALOG ) 0.1 % APPLY 1 APPLICATION TOPICALLY 2 (TWO) TIMES DAILY. 04/04/20   Newlin, Enobong, MD  valsartan  (DIOVAN ) 320 MG tablet Take 1 tablet (320 mg total) by mouth daily. 02/08/24   Okey Vina GAILS, MD    Allergies: Patient has no known allergies.    Review of Systems  Musculoskeletal:  Positive for back pain.  All other systems reviewed and are negative.   Updated Vital Signs BP (!) 153/77   Pulse 66   Temp 98.3 F (36.8 C) (Oral)   Resp 13   LMP 03/13/2006   SpO2 100%   Physical Exam Vitals and nursing note reviewed.  Constitutional:      General: She is not in acute distress.    Appearance: Normal appearance. She is normal weight. She is not ill-appearing, toxic-appearing or diaphoretic.  HENT:     Head: Normocephalic and atraumatic.  Cardiovascular:     Rate and Rhythm: Normal rate.  Pulmonary:     Effort: Pulmonary effort is normal. No respiratory distress.  Abdominal:     General: Abdomen is flat.     Palpations: Abdomen is soft.     Tenderness: There is no abdominal tenderness.  Musculoskeletal:        General: Normal range of motion.     Cervical back: Normal range of motion.     Comments: Tenderness noted to palpation of the mid lumbar spine and surrounding paraspinous musculature.  No step-offs, lesions, deformity, or overlying skin changes.  Able to lift both lower legs off the bed and hold them up without assistance.  Able to plantarflex and dorsiflex BLE as well. DP and PT pulses intact and 2+.   Skin:    General: Skin is warm and dry.  Neurological:     General: No focal deficit present.     Mental Status: She is alert.  Psychiatric:        Mood and Affect: Mood normal.        Behavior: Behavior normal.     (all  labs ordered are listed, but only abnormal results are displayed) Labs Reviewed  URINALYSIS, ROUTINE W REFLEX MICROSCOPIC - Abnormal; Notable for the following components:      Result Value   Color, Urine STRAW (*)    Specific Gravity, Urine 1.002 (*)    All other components within normal limits  BASIC METABOLIC PANEL WITH GFR - Abnormal; Notable for the following components:   Potassium 2.7 (*)    BUN 5 (*)    Calcium  8.8 (*)    Anion gap 17 (*)    All other components within normal limits  CBC WITH DIFFERENTIAL/PLATELET  MAGNESIUM     EKG: EKG Interpretation Date/Time:  Wednesday August 10 2024 16:56:49 EDT Ventricular Rate:  68 PR Interval:  156 QRS Duration:  79 QT Interval:  404 QTC Calculation: 430 R Axis:   46  Text Interpretation: Sinus rhythm Right atrial enlargement Anteroseptal infarct, old Nonspecific T abnormalities, lateral leads Confirmed by Ruthe Cornet [phone]) on 08/10/2024 5:04:54 PM  Radiology: CT L-SPINE NO CHARGE Result Date: 08/10/2024 CLINICAL DATA:  Back pain. EXAM: CT LUMBAR SPINE WITHOUT CONTRAST TECHNIQUE: Multidetector CT imaging of the lumbar spine was performed without intravenous contrast administration. Multiplanar CT image reconstructions were also generated. RADIATION DOSE REDUCTION: This exam was performed according to the departmental dose-optimization program which includes automated exposure control, adjustment of the mA and/or kV according to patient size and/or use of iterative reconstruction technique. COMPARISON:  Radiograph 01/09/2022 FINDINGS: Segmentation: 5 lumbar type vertebrae. Alignment: Dextroscoliotic curvature centered on L3-L4, similar to prior radiograph. Minimal right lateral translation of L4 on L5. No significant listhesis. Vertebrae: No acute fracture. Vertebral body heights are normal. Endplate sclerosis at L3-L4 and L5-S1 with mild associated disc irregularity. Endplate irregularity at L3-L4 has progressed from prior exam.  Paraspinal and other soft tissues: No paraspinal muscle abnormality. Complete assessment on concurrent abdominopelvic CT, reported separately. Disc levels: T12-L1: No spinal canal or neural foraminal stenosis. L1-L2: Slight broad-based disc bulge. No spinal canal or neural foraminal stenosis. L2-L3: Moderate broad-based disc bulge. Mild facet and ligamentum flavum hypertrophy. Mild narrowing of the spinal canal. L3-L4: Broad-based disc bulge. Moderate facet ligamentum flavum hypertrophy, more so on the left. Spinal canal and right neural foraminal stenosis. L4-L5: Broad-based disc bulge with right paracentral disc protrusion. Right facet hypertrophy, bilateral ligamentum flavum thickening. Spinal canal and bilateral neural foraminal stenosis. L5-S1: Broad-based disc bulge. No spinal canal stenosis. Right neural foraminal stenosis. IMPRESSION: 1. No acute fracture of the lumbar spine. 2. Multilevel degenerative disc disease and facet arthropathy with spinal canal and neural foraminal stenosis as described above. 3. Endplate sclerosis at L3-L4 and L5-S1 with mild associated disc irregularity. Endplate irregularity at L3-L4 has progressed from prior exam. This is likely degenerative air related to Modic endplate changes, however if there is clinical concern for discitis/osteomyelitis, recommend further evaluation with MRI. Electronically Signed   By: Andrea Gasman M.D.   On: 08/10/2024 17:10   CT Renal Stone Study Result Date: 08/10/2024 EXAM: CT ABDOMEN AND PELVIS WITHOUT CONTRAST 08/10/2024 04:52:00 PM TECHNIQUE: CT of the abdomen and pelvis was performed without the administration of intravenous contrast. Multiplanar reformatted images are provided for review. Automated exposure control, iterative reconstruction, and/or weight-based adjustment of the mA/kV was utilized to reduce the radiation dose to as low as reasonably achievable. COMPARISON: 08/08/2021 CLINICAL HISTORY: Abdominal/flank pain, stone  suspected. Back Pain; CT Renal Stone Study and L-Spine No Charge; Abdominal/flank pain, stone suspected; See ED Notes:; Back Pain; CT Renal Stone Study and L-Spine No Charge; Abdominal/flank pain, stone- suspected; See ED Notes:; -PT arrives via POV. PT reports extensive back problems. She states her lower back pain became worse 2 days ago. Denies injury. PT is AxOx4. ; Back Pain; CT Renal Stone Study and L-Spine No Charge; Abdominal/flank pain, stone- suspected; See ED Notes:; -PT arrives via POV. PT reports extensive back problems. She states her lower back pain became worse 2 days ago. Denies injury. PT is AxOx4. ; -Pt endorses back pain radiating to legs. Pt endorses 10/10 pain. FINDINGS: LOWER CHEST: No acute abnormality. LIVER: The liver is unremarkable. GALLBLADDER AND BILE DUCTS:  Gallbladder is unremarkable. No biliary ductal dilatation. SPLEEN: No acute abnormality. PANCREAS: No acute abnormality. ADRENAL GLANDS: No acute abnormality. KIDNEYS, URETERS AND BLADDER: Bilateral nonobstructive nephrolithiasis. No stones in the ureters. No hydronephrosis. No perinephric or periureteral stranding. Urinary bladder is unremarkable. GI AND BOWEL: Stomach demonstrates no acute abnormality. There is no bowel obstruction. PERITONEUM AND RETROPERITONEUM: No ascites. No free air. VASCULATURE: Aorta is normal in caliber. Aortic atherosclerosis. LYMPH NODES: No lymphadenopathy. REPRODUCTIVE ORGANS: No acute abnormality. BONES AND SOFT TISSUES: No acute osseous abnormality. No focal soft tissue abnormality. IMPRESSION: 1. Bilateral nonobstructive nephrolithiasis. Electronically signed by: Lynwood Seip MD 08/10/2024 05:08 PM EDT RP Workstation: [id number]    {Document cardiac monitor, telemetry assessment procedure when appropriate:32947} Procedures   Medications Ordered in the ED  magnesium  sulfate IVPB 2 g 50 mL (has no administration in time range)  potassium chloride  10 mEq in 100 mL IVPB (has no administration in  time range)  potassium chloride  SA (KLOR-CON  M) CR tablet 60 mEq (has no administration in time range)  ketorolac  (TORADOL ) 15 MG/ML injection 15 mg (has no administration in time range)  HYDROcodone -acetaminophen  (NORCO/VICODIN) 5-325 MG per tablet 1 tablet (1 tablet Oral Given 08/10/24 1332)  ondansetron  (ZOFRAN -ODT) disintegrating tablet 4 mg (4 mg Oral Given 08/10/24 1334)      {Click here for ABCD2, HEART and other calculators REFRESH Note before signing:1}                              Medical Decision Making Amount and/or Complexity of Data Reviewed Labs: ordered. Radiology: ordered.  Risk Prescription drug management.   This patient is a 68 y.o. female who presents to the ED for concern of back pain, this involves an extensive number of treatment options, and is a complaint that carries with it a high risk of complications and morbidity. The emergent differential diagnosis prior to evaluation includes, but is not limited to,  Fracture (acute/chronic), muscle strain, cauda equina, spinal stenosis, DDD, metastatic cancer, vertebral osteomyelitis, kidney stone, pyelonephritis, AAA, pancreatitis, bowel obstruction, meningitis.  This is not an exhaustive differential.   Past Medical History / Co-morbidities / Social History:  has a past medical history of Chronic back pain, COPD (chronic obstructive pulmonary disease) (HCC), GERD (gastroesophageal reflux disease), Hyperlipidemia, Hypertension, Joint pain, Shortness of breath, Sickle cell trait, Spondylolysis, and Tobacco use.  Additional history: Chart reviewed. Pertinent results include: last spinal imaging available in 2023 which was a lumbar spine x-ray and showed progressed lumbosacral spondylosis  Physical Exam: Physical exam performed. The pertinent findings include:   Tenderness noted to palpation of the mid lumbar spine and surrounding paraspinous musculature.  No step-offs, lesions, deformity, or overlying skin  changes.  Able to lift both lower legs off the bed and hold them up without assistance.  Able to plantarflex and dorsiflex BLE as well. DP and PT pulses intact and 2+.   Lab Tests: I ordered, and personally interpreted labs.  The pertinent results include:  K 2.7 (likely due to albuterol  use, she is chronically hypokalemic), magnesium  WNL   Imaging Studies: I ordered imaging studies including CT renal, l-spine, MRI lumbar spine. I independently visualized and interpreted imaging which showed   CT renal: Bilateral nonobstructive nephrolithiasis.   CT L spine:    . I agree with the radiologist interpretation.   Cardiac Monitoring:  The patient was maintained on a cardiac monitor.  My attending physician Dr. PIERRETTE viewed and interpreted the  cardiac monitored which showed an underlying rhythm of: ***. I agree with this interpretation.   Medications: I ordered medication including ***  for ***. Reevaluation of the patient after these medicines showed that the patient {resolved/improved/worsened:23923::improved}. I have reviewed the patients home medicines and have made adjustments as needed.  Consultations Obtained: I requested consultation with the ***,  and discussed lab and imaging findings as well as pertinent plan - they recommend: ***   Disposition: After consideration of the diagnostic results and the patients response to treatment, I feel that *** .   ***emergency department workup does not suggest an emergent condition requiring admission or immediate intervention beyond what has been performed at this time. The plan is: ***. The patient is safe for discharge and has been instructed to return immediately for worsening symptoms, change in symptoms or any other concerns.  I discussed this case with my attending physician Dr. PIERRETTE who cosigned this note including patient's presenting symptoms, physical exam, and planned diagnostics and interventions. Attending physician stated  agreement with plan or made changes to plan which were implemented.     {Document critical care time when appropriate  Document review of labs and clinical decision tools ie CHADS2VASC2, etc  Document your independent review of radiology images and any outside records  Document your discussion with family members, caretakers and with consultants  Document social determinants of health affecting pt's care  Document your decision making why or why not admission, treatments were needed:32947:::1}   Final diagnoses:  None    ED Discharge Orders     None

## 2024-08-11 ENCOUNTER — Telehealth: Payer: Self-pay

## 2024-08-11 ENCOUNTER — Emergency Department (HOSPITAL_COMMUNITY)

## 2024-08-11 LAB — BASIC METABOLIC PANEL WITH GFR
Anion gap: 14 (ref 5–15)
BUN: 8 mg/dL (ref 8–23)
CO2: 20 mmol/L — ABNORMAL LOW (ref 22–32)
Calcium: 8.9 mg/dL (ref 8.9–10.3)
Chloride: 107 mmol/L (ref 98–111)
Creatinine, Ser: 1.07 mg/dL — ABNORMAL HIGH (ref 0.44–1.00)
GFR, Estimated: 56 mL/min — ABNORMAL LOW (ref >60)
Glucose, Bld: 174 mg/dL — ABNORMAL HIGH (ref 70–99)
Potassium: 3.1 mmol/L — ABNORMAL LOW (ref 3.5–5.1)
Sodium: 141 mmol/L (ref 135–145)

## 2024-08-11 LAB — TROPONIN I (HIGH SENSITIVITY)
Troponin I (High Sensitivity): 6 ng/L (ref <18)
Troponin I (High Sensitivity): 7 ng/L (ref <18)

## 2024-08-11 LAB — CBG MONITORING, ED: Glucose-Capillary: 183 mg/dL — ABNORMAL HIGH (ref 70–99)

## 2024-08-11 MED ORDER — HYDROCODONE-ACETAMINOPHEN 5-325 MG PO TABS: 1.0000 | ORAL_TABLET | Freq: Four times a day (QID) | ORAL | 0 refills | Status: AC | PRN

## 2024-08-11 MED ORDER — ONDANSETRON HCL 4 MG/2ML IJ SOLN
4.0000 mg | Freq: Once | INTRAMUSCULAR | Status: AC
  Administered 2024-08-11: 4 mg via INTRAVENOUS
  Filled 2024-08-11: qty 2

## 2024-08-11 MED ORDER — METHYLPREDNISOLONE 4 MG PO TBPK: ORAL_TABLET | ORAL | 0 refills | Status: AC

## 2024-08-11 MED ORDER — IOHEXOL 350 MG/ML SOLN
45.0000 mL | Freq: Once | INTRAVENOUS | Status: AC | PRN
Start: 2024-08-11 — End: 2024-08-11
  Administered 2024-08-11: 45 mL via INTRAVENOUS

## 2024-08-11 MED ORDER — MECLIZINE HCL 25 MG PO TABS
25.0000 mg | ORAL_TABLET | Freq: Once | ORAL | Status: AC
  Administered 2024-08-11: 25 mg via ORAL
  Filled 2024-08-11: qty 1

## 2024-08-11 MED ORDER — POTASSIUM CHLORIDE CRYS ER 10 MEQ PO TBCR: 20.0000 meq | EXTENDED_RELEASE_TABLET | Freq: Two times a day (BID) | ORAL | 0 refills | Status: DC

## 2024-08-11 MED ORDER — LIDOCAINE 5 % EX PTCH: 1.0000 | MEDICATED_PATCH | CUTANEOUS | 0 refills | Status: AC

## 2024-08-11 MED ORDER — MORPHINE SULFATE (PF) 4 MG/ML IV SOLN
4.0000 mg | Freq: Once | INTRAVENOUS | Status: AC
  Administered 2024-08-11: 4 mg via INTRAVENOUS
  Filled 2024-08-11: qty 1

## 2024-08-11 MED ORDER — POTASSIUM CHLORIDE CRYS ER 20 MEQ PO TBCR
40.0000 meq | EXTENDED_RELEASE_TABLET | Freq: Once | ORAL | Status: AC
  Administered 2024-08-11: 40 meq via ORAL
  Filled 2024-08-11: qty 2

## 2024-08-11 NOTE — ED Provider Notes (Signed)
 Patient discharged by previous shift.  Nursing staff reports she became sweaty, dizzy, lightheaded when she attempted to get up.  Did receive morphine  about midnight but received earlier without adverse effects.  She describes room spinning dizziness worse when she tries to change positions.  No chest pain or shortness of breath.  Blood sugar 180.  EKG is sinus rhythm without acute ST changes.  She has no focal deficits on neuroexam.  Orthostatics are negative.  Potassium was rechecked and improved to 3.1 from 2.7. Troponin negative with low concern for ACS.  CTA is negative for aortic aneurysm or dissection.  Does show moderate emphysema.  Also has right lower lobe lung nodule which needs follow-up.  Hypokalemia is improving but patient still feels quite unsteady and off balance and having difficulty standing up and walking. MRI to be obtained to evaluate for infarct.  MRI negative for infarct.  Patient able to ambulate.  Her dizziness has improved.  No evidence of infarct.  Suspect dizziness likely secondary to medication.  Patient received prescriptions from previous shift as well as potassium replacement.   Carita Senior, MD 08/11/24 [phone]

## 2024-08-11 NOTE — Telephone Encounter (Signed)
 Patient states one of the medications sent needs prior authorization. Reviewed medications, it is the lidocaine  patches. The pharmacy will have to send to insurance for approval.

## 2024-08-11 NOTE — ED Notes (Signed)
Cola given  

## 2024-08-11 NOTE — Discharge Instructions (Addendum)
 As we discussed, your potassium was quite low today.  This is likely due to you not consuming enough potassium and the albuterol  treatments you are giving.  I have given you a prescription for a few days of oral potassium that you need to fill and take as prescribed in its entirety.  After this, please go back to taking your regular daily potassium as prescribed.  It is very important that you follow-up with your primary care provider in the next week to have this rechecked.  Additionally, I recommend that you follow-up closely with your spine specialist for management of the chronic degenerative changes in your lumbar spine that are likely causing the pain you have your experiencing.  I have given you a prescription for Vicodin which is a narcotic pain medication that you should take as prescribed as needed for severe pain only.  Do not drive or operate heavy machinery while taking this medication as it can be sedating.  You could also stick the lidocaine  patches that I prescribed you all to the areas that are uncomfortable.  Additionally I have given you a prescription for steroids which can help with inflammation.  Take these as prescribed in its entirety. CT scan did show a lung nodule which needs a repeat scan in 12 months to make sure is not increasing in size.  Return if development of any new or worsening symptoms

## 2024-08-11 NOTE — ED Notes (Signed)
 Water given

## 2024-08-15 ENCOUNTER — Other Ambulatory Visit: Payer: Self-pay | Admitting: Internal Medicine

## 2024-08-15 ENCOUNTER — Other Ambulatory Visit: Payer: Self-pay | Admitting: Family Medicine

## 2024-08-15 ENCOUNTER — Telehealth: Payer: Self-pay

## 2024-08-15 DIAGNOSIS — I10 Essential (primary) hypertension: Secondary | ICD-10-CM

## 2024-08-15 DIAGNOSIS — F17210 Nicotine dependence, cigarettes, uncomplicated: Secondary | ICD-10-CM

## 2024-08-15 NOTE — Telephone Encounter (Signed)
 Pt called and stated that her pharmacy could not fill her Rx for lidocaine  (LIDODERM ) 5% topical patch. CM called [facility] on [address] [phone]) on pt's behalf. Per pharmacy, lidocaine  5% is not covered by insurance because the 4% is available over the counter and is fairly inexpensive. Updated pt.

## 2024-08-18 ENCOUNTER — Other Ambulatory Visit: Payer: Self-pay

## 2024-08-18 DIAGNOSIS — J449 Chronic obstructive pulmonary disease, unspecified: Secondary | ICD-10-CM

## 2024-08-18 MED ORDER — ALBUTEROL SULFATE HFA 108 (90 BASE) MCG/ACT IN AERS: 2.0000 | INHALATION_SPRAY | Freq: Four times a day (QID) | RESPIRATORY_TRACT | 1 refills | Status: DC | PRN

## 2024-08-30 ENCOUNTER — Other Ambulatory Visit: Payer: Self-pay | Admitting: Pharmacist

## 2024-08-30 NOTE — Progress Notes (Signed)
 Pharmacy Quality Measure Review  This patient is appearing on a report for the adherence measure for cholesterol (statin) medications this calendar year.   Medication: rosuvastatin  Last fill date: 08/24/2024 for 90 day supply  Insurance report was not up to date. No action needed at this time.  Adherence looks great!  Herlene Fleeta Morris, PharmD, JAQUELINE, CPP Clinical Pharmacist [facility] [phone]

## 2024-09-14 ENCOUNTER — Other Ambulatory Visit: Payer: Self-pay | Admitting: Family Medicine

## 2024-09-22 ENCOUNTER — Other Ambulatory Visit: Payer: Self-pay | Admitting: Family Medicine

## 2024-10-27 ENCOUNTER — Other Ambulatory Visit: Payer: Self-pay | Admitting: Family Medicine

## 2024-10-27 DIAGNOSIS — J449 Chronic obstructive pulmonary disease, unspecified: Secondary | ICD-10-CM

## 2024-10-27 DIAGNOSIS — I10 Essential (primary) hypertension: Secondary | ICD-10-CM

## 2024-12-20 ENCOUNTER — Ambulatory Visit
